# Patient Record
Sex: Male | Born: 1970
Health system: Southern US, Community
[De-identification: ages and names within clinical notes are randomized; demographics above are authoritative.]

## PROBLEM LIST (undated history)

## (undated) DIAGNOSIS — M545 Low back pain, unspecified: Secondary | ICD-10-CM

## (undated) DIAGNOSIS — D649 Anemia, unspecified: Secondary | ICD-10-CM

## (undated) DIAGNOSIS — H9319 Tinnitus, unspecified ear: Secondary | ICD-10-CM

## (undated) DIAGNOSIS — F32A Depression, unspecified: Secondary | ICD-10-CM

## (undated) DIAGNOSIS — M25569 Pain in unspecified knee: Secondary | ICD-10-CM

## (undated) DIAGNOSIS — K55069 Acute infarction of intestine, part and extent unspecified: Secondary | ICD-10-CM

## (undated) DIAGNOSIS — H9191 Unspecified hearing loss, right ear: Secondary | ICD-10-CM

## (undated) DIAGNOSIS — T7840XA Allergy, unspecified, initial encounter: Secondary | ICD-10-CM

## (undated) DIAGNOSIS — K6389 Other specified diseases of intestine: Secondary | ICD-10-CM

## (undated) DIAGNOSIS — D689 Coagulation defect, unspecified: Secondary | ICD-10-CM

## (undated) DIAGNOSIS — Z860100 Personal history of colon polyps, unspecified: Secondary | ICD-10-CM

## (undated) DIAGNOSIS — Z87442 Personal history of urinary calculi: Secondary | ICD-10-CM

## (undated) DIAGNOSIS — G4730 Sleep apnea, unspecified: Secondary | ICD-10-CM

## (undated) DIAGNOSIS — F419 Anxiety disorder, unspecified: Secondary | ICD-10-CM

## (undated) DIAGNOSIS — K589 Irritable bowel syndrome without diarrhea: Secondary | ICD-10-CM

## (undated) DIAGNOSIS — C61 Malignant neoplasm of prostate: Secondary | ICD-10-CM

## (undated) DIAGNOSIS — Z5189 Encounter for other specified aftercare: Secondary | ICD-10-CM

## (undated) DIAGNOSIS — I10 Essential (primary) hypertension: Secondary | ICD-10-CM

## (undated) DIAGNOSIS — Z86718 Personal history of other venous thrombosis and embolism: Secondary | ICD-10-CM

## (undated) HISTORY — DX: Personal history of colon polyps, unspecified: Z86.0100

## (undated) HISTORY — DX: Unspecified hearing loss, right ear: H91.91

## (undated) HISTORY — DX: Pain in unspecified knee: M25.569

## (undated) HISTORY — DX: Low back pain, unspecified: M54.50

## (undated) HISTORY — DX: Encounter for other specified aftercare: Z51.89

## (undated) HISTORY — DX: Allergy, unspecified, initial encounter: T78.40XA

## (undated) HISTORY — DX: Personal history of colonic polyps: Z86.010

## (undated) HISTORY — DX: Low back pain: M54.5

## (undated) HISTORY — DX: Other specified diseases of intestine: K63.89

## (undated) HISTORY — PX: COLONOSCOPY: SHX174

## (undated) HISTORY — DX: Irritable bowel syndrome, unspecified: K58.9

## (undated) HISTORY — DX: Acute infarction of intestine, part and extent unspecified: K55.069

## (undated) HISTORY — DX: Malignant neoplasm of prostate: C61

## (undated) HISTORY — DX: Coagulation defect, unspecified: D68.9

## (undated) HISTORY — DX: Personal history of other venous thrombosis and embolism: Z86.718

## (undated) HISTORY — DX: Essential (primary) hypertension: I10

---
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
## 2007-03-18 ENCOUNTER — Encounter: Payer: Self-pay | Admitting: Family Medicine

## 2009-03-06 ENCOUNTER — Ambulatory Visit: Payer: Self-pay | Admitting: Family Medicine

## 2009-03-06 DIAGNOSIS — J309 Allergic rhinitis, unspecified: Secondary | ICD-10-CM | POA: Insufficient documentation

## 2009-03-06 DIAGNOSIS — M545 Low back pain, unspecified: Secondary | ICD-10-CM | POA: Insufficient documentation

## 2009-03-06 DIAGNOSIS — M25569 Pain in unspecified knee: Secondary | ICD-10-CM | POA: Insufficient documentation

## 2009-03-06 DIAGNOSIS — H9190 Unspecified hearing loss, unspecified ear: Secondary | ICD-10-CM | POA: Insufficient documentation

## 2009-03-06 DIAGNOSIS — M2140 Flat foot [pes planus] (acquired), unspecified foot: Secondary | ICD-10-CM | POA: Insufficient documentation

## 2009-04-18 ENCOUNTER — Ambulatory Visit: Payer: Self-pay | Admitting: Internal Medicine

## 2009-04-18 DIAGNOSIS — R4589 Other symptoms and signs involving emotional state: Secondary | ICD-10-CM | POA: Insufficient documentation

## 2009-04-18 DIAGNOSIS — G4700 Insomnia, unspecified: Secondary | ICD-10-CM | POA: Insufficient documentation

## 2009-04-19 ENCOUNTER — Encounter: Payer: Self-pay | Admitting: Internal Medicine

## 2009-04-19 ENCOUNTER — Telehealth: Payer: Self-pay | Admitting: Internal Medicine

## 2009-04-23 ENCOUNTER — Ambulatory Visit: Payer: Self-pay | Admitting: Internal Medicine

## 2010-06-13 ENCOUNTER — Ambulatory Visit: Payer: Self-pay | Admitting: Family Medicine

## 2010-06-13 DIAGNOSIS — G4730 Sleep apnea, unspecified: Secondary | ICD-10-CM | POA: Insufficient documentation

## 2010-06-13 DIAGNOSIS — N529 Male erectile dysfunction, unspecified: Secondary | ICD-10-CM | POA: Insufficient documentation

## 2010-06-16 LAB — CONVERTED CEMR LAB
Albumin: 3.9 g/dL (ref 3.5–5.2)
Alkaline Phosphatase: 42 units/L (ref 39–117)
BUN: 8 mg/dL (ref 6–23)
Basophils Absolute: 0 10*3/uL (ref 0.0–0.1)
CO2: 30 meq/L (ref 19–32)
Calcium: 8.8 mg/dL (ref 8.4–10.5)
Eosinophils Absolute: 0.4 10*3/uL (ref 0.0–0.7)
Glucose, Bld: 79 mg/dL (ref 70–99)
Hemoglobin: 15.3 g/dL (ref 13.0–17.0)
Lymphocytes Relative: 22.6 % (ref 12.0–46.0)
Lymphs Abs: 1.1 10*3/uL (ref 0.7–4.0)
MCHC: 32.8 g/dL (ref 30.0–36.0)
Neutro Abs: 3.1 10*3/uL (ref 1.4–7.7)
Platelets: 202 10*3/uL (ref 150.0–400.0)
RDW: 14.8 % — ABNORMAL HIGH (ref 11.5–14.6)
Sodium: 141 meq/L (ref 135–145)
TSH: 0.78 microintl units/mL (ref 0.35–5.50)
Total Bilirubin: 0.9 mg/dL (ref 0.3–1.2)

## 2010-07-21 ENCOUNTER — Ambulatory Visit
Admission: RE | Admit: 2010-07-21 | Discharge: 2010-07-21 | Payer: Self-pay | Source: Home / Self Care | Attending: Pulmonary Disease | Admitting: Pulmonary Disease

## 2010-07-21 DIAGNOSIS — R0609 Other forms of dyspnea: Secondary | ICD-10-CM | POA: Insufficient documentation

## 2010-07-21 DIAGNOSIS — R0989 Other specified symptoms and signs involving the circulatory and respiratory systems: Secondary | ICD-10-CM

## 2010-07-30 ENCOUNTER — Ambulatory Visit (INDEPENDENT_AMBULATORY_CARE_PROVIDER_SITE_OTHER): Payer: 59 | Admitting: Pulmonary Disease

## 2010-07-30 ENCOUNTER — Encounter: Payer: Self-pay | Admitting: Pulmonary Disease

## 2010-07-30 DIAGNOSIS — G4733 Obstructive sleep apnea (adult) (pediatric): Secondary | ICD-10-CM

## 2010-07-31 NOTE — Assessment & Plan Note (Signed)
Summary: sleep apnea//ccm   Vital Signs:  Patient profile:   40 year old male Height:      67 inches Weight:      218 pounds BMI:     34.27 O2 Sat:      98 % on Room air Temp:     98.3 degrees F oral Pulse rate:   72 / minute BP sitting:   120 / 80  (left arm) Cuff size:   large  Vitals Entered By: Romualdo Bolk, CMA (AAMA) (June 13, 2010 10:01 AM)  O2 Flow:  Room air CC: Discuss Sleep Apnea   History of Present Illness: Here for several reasons. First, he has snored all his life, and his wife says it is getting worse. She now sleeps in a separate room from him due to the snoring. he sleeps well, and she has never mentioned him stopping breathing, but they are both concerned about possible  sleep apnea. He denies any daytime sleepiness. Also, he has develped erection difficulties in the past year. He denies any particular stressors, and his marriage is good.   Preventive Screening-Counseling & Management  Caffeine-Diet-Exercise     Does Patient Exercise: yes  Current Medications (verified): 1)  Naprosyn 500 Mg Tabs (Naproxen) .... Two Times A Day As Needed 2)  Cyclobenzaprine Hcl 10 Mg  Tabs (Cyclobenzaprine Hcl) .Marland Kitchen.. 1 By Mouth 2 Times Daily As Needed For Back Pain 3)  Gabapentin 300 Mg Caps (Gabapentin) .Marland Kitchen.. 1 By Mouth At Bedtime  Allergies (verified): No Known Drug Allergies  Past History:  Past Medical History: Allergic rhinitis pes planus Low back pain, sees Dr. Worthy Rancher and the [facility] clinic, has degenerative discs  knee pain right ear hearing loss  Past Surgical History: Reviewed history from 03/06/2009 and no changes required. Denies surgical history  Review of Systems  The patient denies anorexia, fever, weight loss, vision loss, decreased hearing, hoarseness, chest pain, syncope, dyspnea on exertion, peripheral edema, prolonged cough, headaches, hemoptysis, abdominal pain, melena, hematochezia, severe indigestion/heartburn,  hematuria, incontinence, genital sores, muscle weakness, suspicious skin lesions, transient blindness, difficulty walking, depression, unusual weight change, abnormal bleeding, enlarged lymph nodes, angioedema, breast masses, and testicular masses.    Physical Exam  General:  overweight-appearing.   Head:  Normocephalic and atraumatic without obvious abnormalities. No apparent alopecia or balding. Eyes:  No corneal or conjunctival inflammation noted. EOMI. Perrla. Funduscopic exam benign, without hemorrhages, exudates or papilledema. Vision grossly normal. Ears:  External ear exam shows no significant lesions or deformities.  Otoscopic examination reveals clear canals, tympanic membranes are intact bilaterally without bulging, retraction, inflammation or discharge. Hearing is grossly normal bilaterally. Nose:  External nasal examination shows no deformity or inflammation. Nasal mucosa are pink and moist without lesions or exudates. Nasal passages are quite narrow  Mouth:  Oral mucosa and oropharynx without lesions or exudates.  Teeth in good repair. The posterior OP is quite narrow.  Neck:  No deformities, masses, or tenderness noted. Lungs:  Normal respiratory effort, chest expands symmetrically. Lungs are clear to auscultation, no crackles or wheezes. Heart:  Normal rate and regular rhythm. S1 and S2 normal without gallop, murmur, click, rub or other extra sounds.   Impression & Recommendations:  Problem # 1:  SLEEP APNEA (ICD-780.57)  Orders: Pulmonary Referral (Pulmonary)  Problem # 2:  ERECTILE DYSFUNCTION, ORGANIC (ICD-607.84)  Orders: Venipuncture (19147) TLB-BMP (Basic Metabolic Panel-BMET) (80048-METABOL) TLB-CBC Platelet - w/Differential (85025-CBCD) TLB-Hepatic/Liver Function Pnl (80076-HEPATIC) TLB-TSH (Thyroid Stimulating Hormone) (84443-TSH) TLB-Testosterone, Total (  84403-TESTO) Specimen Handling (16109)  His updated medication list for this problem includes:    Viagra  50 Mg Tabs (Sildenafil citrate) .Marland Kitchen... As needed  Complete Medication List: 1)  Naprosyn 500 Mg Tabs (Naproxen) .... Two times a day as needed 2)  Cyclobenzaprine Hcl 10 Mg Tabs (Cyclobenzaprine hcl) .Marland Kitchen.. 1 by mouth 2 times daily as needed for back pain 3)  Gabapentin 300 Mg Caps (Gabapentin) .Marland Kitchen.. 1 by mouth at bedtime 4)  Viagra 50 Mg Tabs (Sildenafil citrate) .... As needed  Patient Instructions: 1)  refer for a sleep study. Get labs. try Viagra.  2)  It is important that you exercise reguarly at least 20 minutes 5 times a week. If you develop chest pain, have severe difficulty breathing, or feel very tired, stop exercising immediately and seek medical attention.  3)  You need to lose weight. Consider a lower calorie diet and regular exercise.    Orders Added: 1)  Est. Patient Level IV [60454] 2)  Venipuncture [36415] 3)  TLB-BMP (Basic Metabolic Panel-BMET) [80048-METABOL] 4)  TLB-CBC Platelet - w/Differential [85025-CBCD] 5)  TLB-Hepatic/Liver Function Pnl [80076-HEPATIC] 6)  TLB-TSH (Thyroid Stimulating Hormone) [84443-TSH] 7)  TLB-Testosterone, Total [84403-TESTO] 8)  Specimen Handling [99000] 9)  Pulmonary Referral [Pulmonary]

## 2010-07-31 NOTE — Assessment & Plan Note (Signed)
Summary: consult for possible osa   Copy to:  stephen fry Primary Provider/Referring Provider:  Nelwyn Salisbury MD  CC:  sleep consult.  History of Present Illness: the pt is a 40y/o male who I have been asked to see for possible osa.  He has been noted to have loud snoring, but no one has ever commented on an abnormal breathing pattern during sleep.  He denies having any choking arousals.  He goes to bed at 11-69mn, and arises at 6am to start his day.  He feels that he is rested upon arising.  He denies EDS while at work, but admits that he stays fairly busy.  He does not think his alertness is an issue overall during the day.  He does admit though to some sleepiness in the evening while watching tv, and occasionally has sleep pressure with driving longer distances.  The pt states that his weight is neutral over the last 2 years, and his epworth score today is normal at 4.    Current Medications (verified): 1)  Naprosyn 500 Mg Tabs (Naproxen) .... Two Times A Day As Needed 2)  Cyclobenzaprine Hcl 10 Mg  Tabs (Cyclobenzaprine Hcl) .Marland Kitchen.. 1 By Mouth 2 Times Daily As Needed For Back Pain 3)  Gabapentin 300 Mg Caps (Gabapentin) .Marland Kitchen.. 1 By Mouth At Bedtime 4)  Viagra 50 Mg Tabs (Sildenafil Citrate) .... As Needed 5)  Prednisone 5 Mg Tabs (Prednisone) .... Take 1 Tablet By Mouth Once A Day 6)  Tramadol Hcl 50 Mg Tabs (Tramadol Hcl) .... Take 1 Tab By Mouth Every 4 To 6 Hours As Needed  Allergies (verified): No Known Drug Allergies  Past History:  Past Medical History: Reviewed history from 06/13/2010 and no changes required. Allergic rhinitis pes planus Low back pain, sees Dr. Worthy Rancher and the [facility], has degenerative discs  knee pain right ear hearing loss  Past Surgical History: jaw surgery d/t fx 1990  Family History: Family History Diabetes 1st degree relative Family History Hypertension Family History of Prostate CA 1st degree relative <50 heart disease:  mgm  Social History: Former smoker. started at age 55.  1 1/2 ppd.  quit August 2008. Married has children. Alcohol use-yes Drug use-no Regular exercise-yes retired from [employer], now a Doctor, general practice  Review of Systems  The patient denies shortness of breath with activity, shortness of breath at rest, productive cough, non-productive cough, coughing up blood, chest pain, irregular heartbeats, acid heartburn, indigestion, loss of appetite, weight change, abdominal pain, difficulty swallowing, sore throat, tooth/dental problems, headaches, nasal congestion/difficulty breathing through nose, sneezing, itching, ear ache, anxiety, depression, hand/feet swelling, joint stiffness or pain, rash, change in color of mucus, and fever.    Vital Signs:  Patient profile:   40 year old male Height:      67 inches Weight:      218 pounds BMI:     34.27 O2 Sat:      95 % on Room air Temp:     98.2 degrees F oral Pulse rate:   70 / minute BP sitting:   100 / 80  (left arm) Cuff size:   large  Vitals Entered By: Arman Filter LPN (July 21, 2010 10:46 AM)  O2 Flow:  Room air CC: sleep consult Comments Medications reviewed with patient Arman Filter LPN  July 21, 2010 10:46 AM    Physical Exam  General:  ow male in nad Eyes:  PERRLA and EOMI.   Nose:  deviated septum to left with partial obstruction, mild mucosal edema right fairly clear Mouth:  moderate elongation of uvula and palate. no lesions or exudates seen. Neck:  no jvd, tmg, LN Lungs:  totally clear to auscultation Heart:  rrr, no mrg Abdomen:  soft and nontender, bs+ Extremities:  no edema or cyanosis  pulses intact distally Neurologic:  appears mildly sleepy, but awake, moves all 4.   Impression & Recommendations:  Problem # 1:  SNORING (ICD-786.09) the pt has loud snoring by history, but unclear if he has frank sleep apnea.  He feels that he sleep well, but he does have some degree of  daytime sleepiness at various times.  It does not appear to be significantly impacting his QOL however.  I have had a long discussion with the pt about sleep apnea, including its impact on QOL and CV health.  I think he needs to have a sleep study to put this issue to rest, and given his overall health would be a good candidate for a home study.  If this does not show osa, he will need to decide how aggressive to be with his symptomatic snoring.  I will discuss with the pt once the results are available.  Medications Added to Medication List This Visit: 1)  Prednisone 5 Mg Tabs (Prednisone) .... Take 1 tablet by mouth once a day 2)  Tramadol Hcl 50 Mg Tabs (Tramadol hcl) .... Take 1 tab by mouth every 4 to 6 hours as needed  Other Orders: Consultation Level IV (40981) Misc. Referral (Misc. Ref)  Patient Instructions: 1)  will set up for home sleep study.  I will call you with the results. 2)  work on weight loss

## 2010-08-04 DIAGNOSIS — G4733 Obstructive sleep apnea (adult) (pediatric): Secondary | ICD-10-CM | POA: Insufficient documentation

## 2010-08-07 ENCOUNTER — Ambulatory Visit (INDEPENDENT_AMBULATORY_CARE_PROVIDER_SITE_OTHER): Payer: 59 | Admitting: Pulmonary Disease

## 2010-08-07 ENCOUNTER — Encounter: Payer: Self-pay | Admitting: Pulmonary Disease

## 2010-08-07 DIAGNOSIS — G4733 Obstructive sleep apnea (adult) (pediatric): Secondary | ICD-10-CM

## 2010-08-08 ENCOUNTER — Ambulatory Visit: Payer: 59 | Admitting: Pulmonary Disease

## 2010-08-14 NOTE — Assessment & Plan Note (Signed)
Summary: home sleep testing shows AHI 22/hr.   Primary Provider/Referring Provider:  Nelwyn Salisbury MD   History of Present Illness: the pt underwent home sleep testing with a type 3 portable device.  Airflow, effort, heart rate, and oximetry were all monitored.  Flow and saturation evaluation period averaged approximately 6hrs.  Tracings have been reviewed with the following results:  1) the pt had 19 apneas with 9 of them being central in nature, and also 105 obstructive hypopneas.       This gave him an AHI of 22/hr. 2) low oxygen saturation was 81%, and only spent less than or equal to 88% sat.  Allergies: No Known Drug Allergies   Impression & Recommendations:  Problem # 1:  OBSTRUCTIVE SLEEP APNEA (ICD-327.23)  the pt has moderate osa, with AHI 22/hr and desat to 81%.  Will arrange for ov to discuss treatment options.  Other Orders: Sleep Std Airflow/Heartrate and O2 SAT unattended (04540)   Orders Added: 1)  Sleep Std Airflow/Heartrate and O2 SAT unattended [95806]  Appended Document: home sleep testing shows AHI 22/hr. Aundra Millet, pt will need ov to review sleep study results.  let him know was positive for osa, and will discuss further with him at visit.  Appended Document: home sleep testing shows AHI 22/hr. [id number]  Appended Document: home sleep testing shows AHI 22/hr. ATC pt at home #.  Line busy.  WCB  Appended Document: home sleep testing shows AHI 22/hr. pt called back.  pt scheduled to see [facility] 08-07-2010 at 9:30am

## 2010-08-26 NOTE — Assessment & Plan Note (Signed)
Summary: rov to discuss results of home sleep testing.   Primary Provider/Referring Provider:  Nelwyn Salisbury MD  CC:  Ov to discuss home sleep testing results .  History of Present Illness: the pt comes in today to discuss results of his recent home sleep testing.  He was found to have moderate osa with AHI 22/hr and desat to 81%.  I have reviewed the study in detail with him, and answered all of his questions.   Current Medications (verified): 1)  Viagra 50 Mg Tabs (Sildenafil Citrate) .... As Needed 2)  Tramadol Hcl 50 Mg Tabs (Tramadol Hcl) .... Take 1 Tab By Mouth Every 4 To 6 Hours As Needed  Allergies (verified): No Known Drug Allergies  Review of Systems       The patient complains of acid heartburn and indigestion.  The patient denies shortness of breath with activity, shortness of breath at rest, productive cough, non-productive cough, coughing up blood, chest pain, irregular heartbeats, loss of appetite, weight change, abdominal pain, difficulty swallowing, sore throat, tooth/dental problems, headaches, nasal congestion/difficulty breathing through nose, sneezing, itching, ear ache, anxiety, depression, hand/feet swelling, joint stiffness or pain, rash, change in color of mucus, and fever.    Vital Signs:  Patient profile:   40 year old male Height:      67 inches Weight:      214.38 pounds BMI:     33.70 O2 Sat:      95 % on Room air Temp:     98.1 degrees F oral Pulse rate:   73 / minute BP sitting:   102 / 70  (left arm) Cuff size:   large  Vitals Entered By: Arman Filter LPN (August 07, 2010 9:12 AM)  O2 Flow:  Room air CC: Ov to discuss home sleep testing results  Comments Medications reviewed with patient Arman Filter LPN  August 07, 2010 9:12 AM    Physical Exam  General:  obese male in nad Extremities:  no signficant edema, no cyanosis  Neurologic:  alert and oriented, does not appear sleepy moves all 4.    Impression &  Recommendations:  Problem # 1:  OBSTRUCTIVE SLEEP APNEA (ICD-327.23)  the pt has mild to moderate osa by his recent sleep study, and does have impact on his QOL.  I have reviewed the various treatment options with him, including a trial of weight loss alone, upper airway surgery, dental appliance, and cpap.  At this point, he would like to review his surgical options given his abnormal anatomy, and will also work on weight loss.  Other Orders: Est. Patient Level III (16109) ENT Referral (ENT)  Patient Instructions: 1)  will refer to ENT for upper airway evaluation for possible surgery. 2)  work on weight loss 3)  please call me once you see the ENT with your decision.

## 2011-01-14 ENCOUNTER — Other Ambulatory Visit: Payer: 59

## 2011-01-20 ENCOUNTER — Encounter: Payer: Self-pay | Admitting: Family Medicine

## 2011-01-21 ENCOUNTER — Telehealth: Payer: Self-pay | Admitting: Family Medicine

## 2011-01-21 ENCOUNTER — Encounter: Payer: Self-pay | Admitting: Family Medicine

## 2011-01-21 ENCOUNTER — Ambulatory Visit (INDEPENDENT_AMBULATORY_CARE_PROVIDER_SITE_OTHER): Payer: 59 | Admitting: Family Medicine

## 2011-01-21 VITALS — BP 118/86 | HR 76 | Temp 98.6°F | Ht 67.0 in | Wt 200.0 lb

## 2011-01-21 DIAGNOSIS — Z0000 Encounter for general adult medical examination without abnormal findings: Secondary | ICD-10-CM

## 2011-01-21 LAB — LIPID PANEL
Cholesterol: 172 mg/dL (ref 0–200)
Triglycerides: 149 mg/dL (ref 0.0–149.0)

## 2011-01-21 MED ORDER — TADALAFIL 20 MG PO TABS: 20.0000 mg | ORAL_TABLET | Freq: Every day | ORAL | Status: DC | PRN

## 2011-01-21 MED ORDER — FLUTICASONE PROPIONATE 50 MCG/ACT NA SUSP: 2.0000 | Freq: Every day | NASAL | Status: DC

## 2011-01-21 NOTE — Progress Notes (Signed)
  Subjective:    Patient ID: Marcus Pruitt, male    DOB: [birth year], 40 y.o.   MRN: [id number]  HPI 40 yr old male for a cpx. He feels fine and has no concerns.    Review of Systems  Constitutional: Negative.   HENT: Negative.   Eyes: Negative.   Respiratory: Negative.   Cardiovascular: Negative.   Gastrointestinal: Negative.   Genitourinary: Negative.   Musculoskeletal: Negative.   Skin: Negative.   Neurological: Negative.   Hematological: Negative.   Psychiatric/Behavioral: Negative.        Objective:   Physical Exam  Constitutional: He is oriented to person, place, and time. He appears well-developed and well-nourished. No distress.  HENT:  Head: Normocephalic and atraumatic.  Right Ear: External ear normal.  Left Ear: External ear normal.  Nose: Nose normal.  Mouth/Throat: Oropharynx is clear and moist. No oropharyngeal exudate.  Eyes: Conjunctivae and EOM are normal. Pupils are equal, round, and reactive to light. Right eye exhibits no discharge. Left eye exhibits no discharge. No scleral icterus.  Neck: Neck supple. No JVD present. No tracheal deviation present. No thyromegaly present.  Cardiovascular: Normal rate, regular rhythm, normal heart sounds and intact distal pulses.  Exam reveals no gallop and no friction rub.   No murmur heard. Pulmonary/Chest: Effort normal and breath sounds normal. No respiratory distress. He has no wheezes. He has no rales. He exhibits no tenderness.  Abdominal: Soft. Bowel sounds are normal. He exhibits no distension and no mass. There is no tenderness. There is no rebound and no guarding.  Genitourinary: Rectum normal, prostate normal and penis normal. Guaiac negative stool. No penile tenderness.  Musculoskeletal: Normal range of motion. He exhibits no edema and no tenderness.  Lymphadenopathy:    He has no cervical adenopathy.  Neurological: He is alert and oriented to person, place, and time. He has normal reflexes. No cranial nerve  deficit. He exhibits normal muscle tone. Coordination normal.  Skin: Skin is warm and dry. No rash noted. He is not diaphoretic. No erythema. No pallor.  Psychiatric: He has a normal mood and affect. His behavior is normal. Judgment and thought content normal.          Assessment & Plan:  Well exam.

## 2011-01-21 NOTE — Telephone Encounter (Signed)
Pt called and said that his insurance will not cover tadalafil (CIALIS) 20 MG tablet. Pt req an alternative med that pts insurance will cover.

## 2011-01-22 MED ORDER — TADALAFIL 5 MG PO TABS: 5.0000 mg | ORAL_TABLET | Freq: Every day | ORAL | Status: AC | PRN

## 2011-01-22 NOTE — Telephone Encounter (Signed)
Pt called and is req that he would like the Cialis script to be re-written for 5 mg #30. Pt has a voucher that will allow him to get med without his insurance. Pls call in to [facility] on [address].

## 2011-01-22 NOTE — Telephone Encounter (Signed)
rx sent in electronically 

## 2011-12-09 ENCOUNTER — Encounter: Payer: Self-pay | Admitting: Family Medicine

## 2011-12-09 ENCOUNTER — Ambulatory Visit (INDEPENDENT_AMBULATORY_CARE_PROVIDER_SITE_OTHER): Payer: 59 | Admitting: Family Medicine

## 2011-12-09 VITALS — BP 134/80 | HR 92 | Temp 98.5°F | Wt 210.0 lb

## 2011-12-09 DIAGNOSIS — M79609 Pain in unspecified limb: Secondary | ICD-10-CM

## 2011-12-09 DIAGNOSIS — M79676 Pain in unspecified toe(s): Secondary | ICD-10-CM

## 2011-12-09 NOTE — Progress Notes (Signed)
  Subjective:    Patient ID: Marcus Pruitt, male    DOB: 11-08-1970, 41 y.o.   MRN: [id number]  HPI Here complaining about severe pain in the 2nd toe on the left foot. On 11-16-11 he had surgery on this toe per Dr. Tereasa Coop, a podiatrist at the [facility] clinic. His phone number is [phone] (ext [phone]). This was to remove a benign lesion on the bottom of the toe that was quite deep, and the surgery went all the way down to the flexor tendon per the patient. He did well at first, and he was cleard to return to work on 12-07-11. His job requires him to walk or stand constantly on a hard floor while wearing steel toed boots. The toe became very painful the first day, and has gotten worse. Today Dupree had to leave work early due to severe pain. There has been no redness in the toe, no drainage. Using Motrin for pain during the day and hydrocodone pain meds at night.    Review of Systems  Constitutional: Negative.   Musculoskeletal: Positive for arthralgias.       Objective:   Physical Exam  Constitutional:       In pain, limping  Musculoskeletal:       The undersurface of the left 2nd toe has a surgical wound that is partially closed with granulation tissue. It looks clean, no redness or warmth or swelling or drainage. It is extremely tender          Assessment & Plan:  Toe pain after a surgery. Unfortunately Dr. Andrey Campanile is the only podiatrist at the [facility] clinic, and he will be out of the office until 12-18-11. There is no one to cover his patients in his absence apparently, so Ariyan asks [place] for help. He says he cannot work at all as along as the toe hurts this bad. We wrote a note for him to be out of work today until 12-18-11. On that day he will follow up with Dr. Andrey Campanile. Stay off the foot, use Motrin prn

## 2011-12-10 ENCOUNTER — Telehealth: Payer: Self-pay | Admitting: Family Medicine

## 2011-12-10 NOTE — Telephone Encounter (Signed)
Pt needs work note extended until 12-21-2011. Dr Denyse Amass at [facility] will not be back in office until 12-21-2011

## 2011-12-10 NOTE — Telephone Encounter (Signed)
Pt was seen on 12-09-2011. Pt needs disability paperwork completed for one week Dr Clent Ridges put him out of work. Pt will fax form over. This problem (toe) is results of relapse.

## 2011-12-11 NOTE — Telephone Encounter (Signed)
I filled out work note, okay per Dr. Clent Ridges. I spoke with pt and he will pick up.

## 2011-12-11 NOTE — Telephone Encounter (Signed)
Okay, I will fill out the forms when I get them

## 2011-12-17 DIAGNOSIS — Z0279 Encounter for issue of other medical certificate: Secondary | ICD-10-CM

## 2011-12-17 NOTE — Telephone Encounter (Signed)
Patient called stating he would like a copy of the form when complete.

## 2011-12-18 NOTE — Telephone Encounter (Signed)
Dr. Clent Ridges did fill out the paper work this week for disability and he sent it up front.  Can you check to see if we still have the copy and let pt know?

## 2012-04-19 ENCOUNTER — Encounter: Payer: Self-pay | Admitting: Family Medicine

## 2012-04-19 ENCOUNTER — Ambulatory Visit (INDEPENDENT_AMBULATORY_CARE_PROVIDER_SITE_OTHER): Payer: 59 | Admitting: Family Medicine

## 2012-04-19 VITALS — BP 132/84 | HR 80 | Temp 98.6°F | Wt 203.0 lb

## 2012-04-19 DIAGNOSIS — Z202 Contact with and (suspected) exposure to infections with a predominantly sexual mode of transmission: Secondary | ICD-10-CM

## 2012-04-19 DIAGNOSIS — Z2089 Contact with and (suspected) exposure to other communicable diseases: Secondary | ICD-10-CM

## 2012-04-19 MED ORDER — METRONIDAZOLE 500 MG PO TABS: 500.0000 mg | ORAL_TABLET | Freq: Once | ORAL | Status: DC

## 2012-04-19 NOTE — Progress Notes (Signed)
  Subjective:    Patient ID: Marcus Pruitt, male    DOB: September 17, 1970, 41 y.o.   MRN: [id number]  HPI Here asking to be checked for STDs. He has had sex with someone about 3 times recently, and this person was recently told that they have Trichomonas. They were treated for this. Herchel denies any symptoms. He recently had screening labs at the [place], and these included an HIV.    Review of Systems  Constitutional: Negative.   Genitourinary: Negative.        Objective:   Physical Exam  Constitutional: He appears well-developed and well-nourished.          Assessment & Plan:  Exposure to STDs. Treat with Metronidazole to cover Trichomonas. Get other labs.

## 2012-04-20 LAB — URINALYSIS, MICROSCOPIC ONLY
Bacteria, UA: NONE SEEN
Casts: NONE SEEN

## 2012-04-20 LAB — RPR

## 2012-04-20 NOTE — Progress Notes (Signed)
Quick Note:  I left voice message, saying results were negative. ______

## 2012-04-28 ENCOUNTER — Telehealth: Payer: Self-pay | Admitting: Family Medicine

## 2012-04-28 NOTE — Telephone Encounter (Signed)
Patient called stating that upon picking up his rx from the pharmacy, the instructions were not clear concerning the flagyl. Please assist.

## 2012-04-28 NOTE — Telephone Encounter (Signed)
I left voice message with below information. 

## 2012-04-28 NOTE — Telephone Encounter (Signed)
He is to take all 4 tablets at the same time

## 2012-05-04 ENCOUNTER — Encounter: Payer: 59 | Admitting: Family Medicine

## 2012-08-13 ENCOUNTER — Other Ambulatory Visit: Payer: Self-pay

## 2013-05-04 ENCOUNTER — Other Ambulatory Visit: Payer: Self-pay

## 2013-12-26 ENCOUNTER — Other Ambulatory Visit: Payer: 59

## 2014-01-01 ENCOUNTER — Encounter: Payer: 59 | Admitting: Family Medicine

## 2014-01-29 ENCOUNTER — Ambulatory Visit: Payer: 59 | Admitting: Family Medicine

## 2014-01-31 ENCOUNTER — Ambulatory Visit: Payer: 59 | Admitting: Family Medicine

## 2014-02-01 ENCOUNTER — Ambulatory Visit (INDEPENDENT_AMBULATORY_CARE_PROVIDER_SITE_OTHER): Payer: 59 | Admitting: Family Medicine

## 2014-02-01 ENCOUNTER — Encounter: Payer: Self-pay | Admitting: Family Medicine

## 2014-02-01 VITALS — BP 124/69 | HR 85 | Temp 98.9°F | Ht 67.0 in | Wt 214.0 lb

## 2014-02-01 DIAGNOSIS — M545 Low back pain, unspecified: Secondary | ICD-10-CM

## 2014-02-01 DIAGNOSIS — N529 Male erectile dysfunction, unspecified: Secondary | ICD-10-CM

## 2014-02-01 DIAGNOSIS — M25569 Pain in unspecified knee: Secondary | ICD-10-CM

## 2014-02-01 MED ORDER — SILDENAFIL CITRATE 100 MG PO TABS: 50.0000 mg | ORAL_TABLET | Freq: Every day | ORAL | Status: DC | PRN

## 2014-02-01 NOTE — Progress Notes (Signed)
Pre visit review using our clinic review tool, if applicable. No additional management support is needed unless otherwise documented below in the visit note. 

## 2014-02-02 ENCOUNTER — Encounter: Payer: Self-pay | Admitting: Family Medicine

## 2014-02-02 NOTE — Progress Notes (Signed)
   Subjective:    Patient ID: Marcus Pruitt, male    DOB: April 03, 1971, 43 y.o.   MRN: [id number]  HPI Here to discuss ED meds. He had tried daily Cialis 5 mg and was not happy with the results. He has tried Viagra in the past and it worked well but gave him some HAs.    Review of Systems  Constitutional: Negative.        Objective:   Physical Exam  Constitutional: He appears well-developed and well-nourished.  Cardiovascular: Normal rate, regular rhythm, normal heart sounds and intact distal pulses.   Pulmonary/Chest: Effort normal and breath sounds normal.          Assessment & Plan:  We refilled Viagra 100 mg and I told him e has the option of cutting these in half.

## 2014-03-29 ENCOUNTER — Telehealth: Payer: Self-pay

## 2014-03-29 NOTE — Telephone Encounter (Signed)
Pt was seen at urgent care for elevated BP and was given Lisinopril 5 mg.  Pt would like to know if he should start that medication.  His the bottom number of his BP has been above 80. Dr. Sarajane Jews only has same day appointments.  Pls advise if pt can be worked in on 03/30/14.

## 2014-03-30 ENCOUNTER — Encounter: Payer: Self-pay | Admitting: Family Medicine

## 2014-03-30 ENCOUNTER — Ambulatory Visit (INDEPENDENT_AMBULATORY_CARE_PROVIDER_SITE_OTHER): Payer: 59 | Admitting: Family Medicine

## 2014-03-30 VITALS — BP 130/102 | Temp 98.9°F | Wt 210.0 lb

## 2014-03-30 DIAGNOSIS — I10 Essential (primary) hypertension: Secondary | ICD-10-CM

## 2014-03-30 MED ORDER — LISINOPRIL-HYDROCHLOROTHIAZIDE 10-12.5 MG PO TABS: 1.0000 | ORAL_TABLET | Freq: Every day | ORAL | Status: DC

## 2014-03-30 NOTE — Telephone Encounter (Signed)
Can you call pt to schedule for today?

## 2014-03-30 NOTE — Telephone Encounter (Signed)
appt sch today at 1pm

## 2014-03-30 NOTE — Progress Notes (Signed)
   Subjective:    Patient ID: Marcus Pruitt, male    DOB: Feb 07, 1971, 43 y.o.   MRN: [id number]  HPI Here for elevated BP. He has a strong family hx of HTN including his mother who has had a stroke. Over the past 6 months his BP has been slowly rising and he found it to be 735 systolic at work 2 weeks ago. Then he went to Urgent Care last week for lightheadedness and it was 141/102. They told him to see his PCP. Today he feels fine, no HA or chest pain or SOB. 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    Review of Systems  Constitutional: Negative.   Respiratory: Negative.   Cardiovascular: Negative.   Neurological: Negative.        Objective:   Physical Exam  Constitutional: He is oriented to person, place, and time. He appears well-developed and well-nourished.  Cardiovascular: Normal rate, regular rhythm, normal heart sounds and intact distal pulses.   Pulmonary/Chest: Effort normal and breath sounds normal.  Neurological: He is alert and oriented to person, place, and time.          Assessment & Plan:  He has HTN and we will treat it. Start on Lisinopril HCT. He will lose some weight. He had a battery of lab tests at the [place] 2 months ago that were reportedly normal. I asked him to supply me with these results. Recheck in one month

## 2014-03-30 NOTE — Progress Notes (Signed)
Pre visit review using our clinic review tool, if applicable. No additional management support is needed unless otherwise documented below in the visit note. 

## 2014-04-02 ENCOUNTER — Telehealth: Payer: Self-pay | Admitting: Family Medicine

## 2014-04-02 NOTE — Telephone Encounter (Signed)
emmi emailed °

## 2014-05-01 ENCOUNTER — Ambulatory Visit (INDEPENDENT_AMBULATORY_CARE_PROVIDER_SITE_OTHER): Payer: 59 | Admitting: Family Medicine

## 2014-05-01 ENCOUNTER — Encounter: Payer: Self-pay | Admitting: Family Medicine

## 2014-05-01 VITALS — BP 122/80 | HR 71 | Temp 98.7°F | Ht 67.0 in | Wt 207.0 lb

## 2014-05-01 DIAGNOSIS — I10 Essential (primary) hypertension: Secondary | ICD-10-CM

## 2014-05-01 NOTE — Progress Notes (Signed)
Pre visit review using our clinic review tool, if applicable. No additional management support is needed unless otherwise documented below in the visit note. 

## 2014-05-01 NOTE — Progress Notes (Signed)
   Subjective:    Patient ID: Marcus Pruitt, male    DOB: April 15, 1971, 43 y.o.   MRN: [id number]  HPI Here to follow up on newly diagnosed HTN. He tolerates the ne medication well and his BP is stable at home. He feels great.    Review of Systems  Constitutional: Negative.   Respiratory: Negative.   Cardiovascular: Negative.        Objective:   Physical Exam  Constitutional: He appears well-developed and well-nourished.  Cardiovascular: Normal rate, regular rhythm, normal heart sounds and intact distal pulses.   Pulmonary/Chest: Effort normal and breath sounds normal.          Assessment & Plan:  His BP is well controlled. Recheck in 6 months

## 2014-05-08 ENCOUNTER — Ambulatory Visit (INDEPENDENT_AMBULATORY_CARE_PROVIDER_SITE_OTHER): Payer: 59 | Admitting: Internal Medicine

## 2014-05-08 ENCOUNTER — Encounter: Payer: Self-pay | Admitting: Internal Medicine

## 2014-05-08 VITALS — BP 130/90 | HR 81 | Temp 98.4°F | Wt 208.4 lb

## 2014-05-08 DIAGNOSIS — I10 Essential (primary) hypertension: Secondary | ICD-10-CM

## 2014-05-08 DIAGNOSIS — H01003 Unspecified blepharitis right eye, unspecified eyelid: Secondary | ICD-10-CM

## 2014-05-08 MED ORDER — DOXYCYCLINE HYCLATE 100 MG PO TABS: 100.0000 mg | ORAL_TABLET | Freq: Two times a day (BID) | ORAL | Status: DC

## 2014-05-08 NOTE — Progress Notes (Signed)
   Subjective:    Patient ID: Marcus Pruitt, male    DOB: [birth year], 43 y.o.   MRN: [id number]  HPI  Here with acute onset 2-3 days right upper eyelid sharp red/tender/swelling after stye for a few days which did not get better with warm compress..  No trauma, fever, eye pain or vision change.  No HA, sinus symptoms, earache, ST, or cough.   No prior hx of same.  No sick contatct.  Friend has what sounds like a hordoleum..  Nothing else makes better or worse.  Pt denies chest pain, increased sob or doe, wheezing, orthopnea, PND, increased LE swelling, palpitations, dizziness or syncope. Past Medical History  Diagnosis Date  . Allergy   . Low back pain   . Knee pain   . Hearing loss in right ear   . Hypertension    Past Surgical History  Procedure Laterality Date  . Mandible surgery  1990    reports that he quit smoking about 7 years ago. He has never used smokeless tobacco. He reports that he drinks alcohol. He reports that he does not use illicit drugs. family history includes Cancer in an other family member; Diabetes in an other family member; Heart disease in his maternal grandmother; Hypertension in an other family member. No Known Allergies Current Outpatient Prescriptions on File Prior to Visit  Medication Sig Dispense Refill  . HYDROcodone-acetaminophen (NORCO/VICODIN) 5-325 MG per tablet Take 1 tablet by mouth every 6 (six) hours as needed.    Marland Kitchen lisinopril-hydrochlorothiazide (PRINZIDE,ZESTORETIC) 10-12.5 MG per tablet Take 1 tablet by mouth daily. 30 tablet 5  . Naproxen Sodium (ALEVE PO) Take 500 mg by mouth daily as needed.    . sildenafil (VIAGRA) 100 MG tablet Take 0.5-1 tablets (50-100 mg total) by mouth daily as needed for erectile dysfunction. 10 tablet 11   No current facility-administered medications on file prior to visit.   Review of Systems All otherwise neg per pt     Objective:   Physical Exam BP 130/90 mmHg  Pulse 81  Temp(Src) 98.4 F (36.9 C) (Oral)  Wt  208 lb 6 oz (94.518 kg)  SpO2 94% VS noted,  Constitutional: Pt appears well-developed, well-nourished.  HENT: Head: NCAT.  Right Ear: External ear normal.  Left Ear: External ear normal.  Eyes: . Pupils are equal, round, and reactive to light. Conjunctivae and EOM are normal ; eyelids normal except right upper eyelid only with 1-2= red/tender/swelling, nonfluctuance, no drainage or cyst Neck: Normal range of motion. Neck supple.  Cardiovascular: Normal rate and regular rhythm.   Pulmonary/Chest: Effort normal and breath sounds normal.  Neurological: Pt is alert. Not confused , motor grossly intact Skin: Skin is warm. No rash Psychiatric: Pt behavior is normal. No agitation.     Assessment & Plan:

## 2014-05-08 NOTE — Assessment & Plan Note (Signed)
stable overall by history and exam, recent data reviewed with pt, and pt to continue medical treatment as before,  to f/u any worsening symptoms or concerns BP Readings from Last 3 Encounters:  05/08/14 130/90  05/01/14 122/80  03/30/14 130/102

## 2014-05-08 NOTE — Assessment & Plan Note (Signed)
Mild to mod, for antibx course,  to f/u any worsening symptoms or concerns 

## 2014-05-08 NOTE — Patient Instructions (Signed)
Please take all new medication as prescribed  Please continue all other medications as before, and refills have been done if requested.  Please have the pharmacy call with any other refills you may need.  Please keep your appointments with your specialists as you may have planned     

## 2014-05-08 NOTE — Progress Notes (Signed)
Pre visit review using our clinic review tool, if applicable. No additional management support is needed unless otherwise documented below in the visit note. 

## 2015-02-06 ENCOUNTER — Other Ambulatory Visit (HOSPITAL_COMMUNITY)
Admission: RE | Admit: 2015-02-06 | Discharge: 2015-02-06 | Disposition: A | Discharge status: Home / Self Care | Payer: 59 | Source: Ambulatory Visit | Attending: Adult Health | Admitting: Adult Health

## 2015-02-06 ENCOUNTER — Encounter: Payer: Self-pay | Admitting: Adult Health

## 2015-02-06 ENCOUNTER — Ambulatory Visit (INDEPENDENT_AMBULATORY_CARE_PROVIDER_SITE_OTHER): Payer: 59 | Admitting: Adult Health

## 2015-02-06 VITALS — BP 128/84 | Temp 98.5°F | Ht 67.0 in | Wt 216.1 lb

## 2015-02-06 DIAGNOSIS — N528 Other male erectile dysfunction: Secondary | ICD-10-CM | POA: Diagnosis not present

## 2015-02-06 DIAGNOSIS — Z113 Encounter for screening for infections with a predominantly sexual mode of transmission: Secondary | ICD-10-CM | POA: Insufficient documentation

## 2015-02-06 DIAGNOSIS — N529 Male erectile dysfunction, unspecified: Secondary | ICD-10-CM

## 2015-02-06 MED ORDER — SILDENAFIL CITRATE 100 MG PO TABS: 50.0000 mg | ORAL_TABLET | Freq: Every day | ORAL | Status: DC | PRN

## 2015-02-06 NOTE — Patient Instructions (Signed)
I will follow up with you regarding the labs, they should all be back in 2-3 days.   Continue to practice safe sex.   Change diet and start exercising to help with the ED

## 2015-02-06 NOTE — Progress Notes (Signed)
Subjective:    Patient ID: Marcus Pruitt, male    DOB: May 10, 1971, 44 y.o.   MRN: [id number]  HPI  44 year old male who is in a new relationship and would like to be tested for STD. He has no symptoms of STD's. His only other complaint is that of ED. He has suffered from this in the past and has noticed that it is happening more often. He endorses eating terribly, drinking more alcohol and not exercising.    Review of Systems  Constitutional: Negative.   Respiratory: Negative.   Cardiovascular: Negative.   Genitourinary: Negative.   Musculoskeletal: Negative.   Skin: Negative.   Neurological: Negative.   Psychiatric/Behavioral: Negative.   All other systems reviewed and are negative.  Past Medical History  Diagnosis Date  . Allergy   . Low back pain   . Knee pain   . Hearing loss in right ear   . Hypertension     Social History   Social History  . Marital Status: Married    Spouse Name: N/A  . Number of Children: N/A  . Years of Education: N/A   Occupational History  . Not on file.   Social History Main Topics  . Smoking status: Former Smoker    Quit date: 01/28/2007  . Smokeless tobacco: Never Used  . Alcohol Use: 0.0 oz/week    0 Standard drinks or equivalent per week     Comment: occ  . Drug Use: No  . Sexual Activity: Not on file   Other Topics Concern  . Not on file   Social History Narrative    Past Surgical History  Procedure Laterality Date  . Mandible surgery  1990    Family History  Problem Relation Age of Onset  . Heart disease Maternal Grandmother   . Diabetes      fhx  . Hypertension      fhx  . Cancer      fhx/prostate    No Known Allergies  Current Outpatient Prescriptions on File Prior to Visit  Medication Sig Dispense Refill  . Naproxen Sodium (ALEVE PO) Take 500 mg by mouth daily as needed.     No current facility-administered medications on file prior to visit.    BP 128/84 mmHg  Temp(Src) 98.5 F (36.9 C) (Oral)   Ht 5\' 7"  (1.702 m)  Wt 216 lb 1.6 oz (98.022 kg)  BMI 33.84 kg/m2       Objective:   Physical Exam  Constitutional: He is oriented to person, place, and time. He appears well-developed and well-nourished. No distress.  Eyes: Right eye exhibits no discharge. Left eye exhibits no discharge.  Cardiovascular: Normal rate, regular rhythm, normal heart sounds and intact distal pulses.  Exam reveals no friction rub.   No murmur heard. Pulmonary/Chest: Effort normal and breath sounds normal. No respiratory distress. He has no wheezes. He has no rales. He exhibits no tenderness.  Abdominal: Soft. Bowel sounds are normal. He exhibits no distension and no mass. There is no tenderness. There is no rebound and no guarding.  Genitourinary:  Refused by patient.   Musculoskeletal: Normal range of motion.  Lymphadenopathy:    He has no cervical adenopathy.  Neurological: He is alert and oriented to person, place, and time.  Skin: Skin is warm and dry. He is not diaphoretic.  Psychiatric: He has a normal mood and affect. His behavior is normal. Judgment and thought content normal.  Nursing note and vitals reviewed.  Assessment & Plan:  1. Screen for STD (sexually transmitted disease) - Acute Hep Panel & Hep B Surface Ab - HIV antibody - HSV(herpes smplx)abs-1+2(IgG+IgM)-bld - RPR - Urine cytology ancillary only - Will follow up with patient regarding labs.  2. ERECTILE DYSFUNCTION, ORGANIC - Stressed the importance of healthy eating, decreasing alcohol and exercising.  - sildenafil (VIAGRA) 100 MG tablet; Take 0.5-1 tablets (50-100 mg total) by mouth daily as needed for erectile dysfunction.  Dispense: 10 tablet; Refill: 11

## 2015-02-06 NOTE — Progress Notes (Signed)
Pre visit review using our clinic review tool, if applicable. No additional management support is needed unless otherwise documented below in the visit note. 

## 2015-02-07 LAB — ACUTE HEP PANEL AND HEP B SURFACE AB
HCV AB: NEGATIVE
HEP A IGM: NONREACTIVE
HEP B C IGM: NONREACTIVE
Hep B S Ab: POSITIVE — AB
Hepatitis B Surface Ag: NEGATIVE

## 2015-02-07 LAB — HIV ANTIBODY (ROUTINE TESTING W REFLEX): HIV 1&2 Ab, 4th Generation: NONREACTIVE

## 2015-02-07 LAB — RPR

## 2015-02-07 LAB — URINE CYTOLOGY ANCILLARY ONLY
CHLAMYDIA, DNA PROBE: NEGATIVE
NEISSERIA GONORRHEA: NEGATIVE
TRICH (WINDOWPATH): NEGATIVE

## 2015-02-08 LAB — HSV(HERPES SMPLX)ABS-I+II(IGG+IGM)-BLD
HERPES SIMPLEX VRS I-IGM AB (EIA): 0.15 {index}
HSV 1 Glycoprotein G Ab, IgG: <0.1 IV
HSV 2 Glycoprotein G Ab, IgG: 0.14 IV

## 2015-02-19 ENCOUNTER — Telehealth: Payer: Self-pay | Admitting: Family Medicine

## 2015-02-19 NOTE — Telephone Encounter (Signed)
Per [employer] [facility] labs were released to [place] and everything was negative. Called and spoke with pt and pt is aware.

## 2015-02-19 NOTE — Telephone Encounter (Signed)
Pt would like blood work results from 02-06-15. Pt saw cory

## 2015-03-06 ENCOUNTER — Other Ambulatory Visit: Payer: 59

## 2015-03-11 ENCOUNTER — Other Ambulatory Visit (INDEPENDENT_AMBULATORY_CARE_PROVIDER_SITE_OTHER): Payer: 59

## 2015-03-11 DIAGNOSIS — Z0000 Encounter for general adult medical examination without abnormal findings: Secondary | ICD-10-CM | POA: Diagnosis not present

## 2015-03-11 DIAGNOSIS — R7989 Other specified abnormal findings of blood chemistry: Secondary | ICD-10-CM

## 2015-03-11 LAB — BASIC METABOLIC PANEL
BUN: 11 mg/dL (ref 6–23)
CALCIUM: 9.6 mg/dL (ref 8.4–10.5)
CO2: 29 mEq/L (ref 19–32)
Chloride: 103 mEq/L (ref 96–112)
Creatinine, Ser: 1.09 mg/dL (ref 0.40–1.50)
GFR: 94.23 mL/min (ref >60.00)
GLUCOSE: 87 mg/dL (ref 70–99)
Potassium: 4.2 mEq/L (ref 3.5–5.1)
Sodium: 140 mEq/L (ref 135–145)

## 2015-03-11 LAB — CBC WITH DIFFERENTIAL/PLATELET
Basophils Absolute: 0 10*3/uL (ref 0.0–0.1)
Basophils Relative: 0.3 % (ref 0.0–3.0)
EOS PCT: 5.2 % — AB (ref 0.0–5.0)
Eosinophils Absolute: 0.3 10*3/uL (ref 0.0–0.7)
HEMATOCRIT: 47.5 % (ref 39.0–52.0)
HEMOGLOBIN: 15.9 g/dL (ref 13.0–17.0)
LYMPHS PCT: 23.2 % (ref 12.0–46.0)
Lymphs Abs: 1.3 10*3/uL (ref 0.7–4.0)
MCHC: 33.4 g/dL (ref 30.0–36.0)
MCV: 83.7 fl (ref 78.0–100.0)
MONO ABS: 0.4 10*3/uL (ref 0.1–1.0)
Monocytes Relative: 7 % (ref 3.0–12.0)
Neutro Abs: 3.6 10*3/uL (ref 1.4–7.7)
Neutrophils Relative %: 64.3 % (ref 43.0–77.0)
Platelets: 210 10*3/uL (ref 150.0–400.0)
RBC: 5.67 Mil/uL (ref 4.22–5.81)
RDW: 15 % (ref 11.5–15.5)
WBC: 5.6 10*3/uL (ref 4.0–10.5)

## 2015-03-11 LAB — HEPATIC FUNCTION PANEL
ALT: 26 U/L (ref 0–53)
AST: 24 U/L (ref 0–37)
Albumin: 4.3 g/dL (ref 3.5–5.2)
Alkaline Phosphatase: 43 U/L (ref 39–117)
BILIRUBIN DIRECT: 0.1 mg/dL (ref 0.0–0.3)
BILIRUBIN TOTAL: 0.7 mg/dL (ref 0.2–1.2)
TOTAL PROTEIN: 7.5 g/dL (ref 6.0–8.3)

## 2015-03-11 LAB — LIPID PANEL
CHOL/HDL RATIO: 4
Cholesterol: 205 mg/dL — ABNORMAL HIGH (ref 0–200)
HDL: 47 mg/dL (ref >39.00)
NONHDL: 157.97
Triglycerides: 209 mg/dL — ABNORMAL HIGH (ref 0.0–149.0)
VLDL: 41.8 mg/dL — AB (ref 0.0–40.0)

## 2015-03-11 LAB — POCT URINALYSIS DIPSTICK
BILIRUBIN UA: NEGATIVE
Blood, UA: NEGATIVE
Glucose, UA: NEGATIVE
KETONES UA: NEGATIVE
LEUKOCYTES UA: NEGATIVE
Nitrite, UA: NEGATIVE
Protein, UA: NEGATIVE
Spec Grav, UA: 1.025
Urobilinogen, UA: 0.2
pH, UA: 5.5

## 2015-03-11 LAB — LDL CHOLESTEROL, DIRECT: LDL DIRECT: 118 mg/dL

## 2015-03-11 LAB — PSA: PSA: 3.93 ng/mL (ref 0.10–4.00)

## 2015-03-11 LAB — TSH: TSH: 0.67 u[IU]/mL (ref 0.35–4.50)

## 2015-03-14 ENCOUNTER — Encounter: Payer: Self-pay | Admitting: Family Medicine

## 2015-03-14 ENCOUNTER — Telehealth: Payer: Self-pay | Admitting: Family Medicine

## 2015-03-14 NOTE — Telephone Encounter (Signed)
Pt was on schedule to see Dr. Sarajane Jews for a physical today. I called and spoke with pt, he had wrote down another time. I advised pt to reschedule this.

## 2015-04-08 ENCOUNTER — Telehealth: Payer: Self-pay | Admitting: Family Medicine

## 2015-04-08 NOTE — Telephone Encounter (Signed)
Pt states he had a death in the family and missed his cpe on 9/15.   But pt did do his labs prior to his appt.   Pt forgot to reschedule his cpe, and is being billed $964.50. Pt would like to know if he can be worked in for a cpe  next week anytime so his insurance will pay for those labs.

## 2015-04-09 NOTE — Telephone Encounter (Signed)
Yes we can work him in for a cpx

## 2015-04-10 ENCOUNTER — Encounter: Payer: Self-pay | Admitting: Family Medicine

## 2015-04-10 ENCOUNTER — Ambulatory Visit (INDEPENDENT_AMBULATORY_CARE_PROVIDER_SITE_OTHER): Payer: 59 | Admitting: Family Medicine

## 2015-04-10 VITALS — BP 129/86 | HR 87 | Temp 98.4°F | Ht 67.0 in | Wt 216.0 lb

## 2015-04-10 DIAGNOSIS — Z0000 Encounter for general adult medical examination without abnormal findings: Secondary | ICD-10-CM | POA: Diagnosis not present

## 2015-04-10 DIAGNOSIS — Z8042 Family history of malignant neoplasm of prostate: Secondary | ICD-10-CM

## 2015-04-10 NOTE — Progress Notes (Signed)
Pre visit review using our clinic review tool, if applicable. No additional management support is needed unless otherwise documented below in the visit note. 

## 2015-04-10 NOTE — Telephone Encounter (Signed)
Pt has been scheduled.  °

## 2015-04-11 ENCOUNTER — Encounter: Payer: Self-pay | Admitting: Family Medicine

## 2015-04-11 NOTE — Progress Notes (Signed)
   Subjective:    Patient ID: Marcus Pruitt, male    DOB: 1970-12-14, 44 y.o.   MRN: [id number]  HPI 44 yr old male for a cpx. He feels well.    Review of Systems  Constitutional: Negative.   HENT: Negative.   Eyes: Negative.   Respiratory: Negative.   Cardiovascular: Negative.   Gastrointestinal: Negative.   Genitourinary: Negative.   Musculoskeletal: Negative.   Skin: Negative.   Neurological: Negative.   Psychiatric/Behavioral: Negative.        Objective:   Physical Exam  Constitutional: He is oriented to person, place, and time. He appears well-developed and well-nourished. No distress.  HENT:  Head: Normocephalic and atraumatic.  Right Ear: External ear normal.  Left Ear: External ear normal.  Nose: Nose normal.  Mouth/Throat: Oropharynx is clear and moist. No oropharyngeal exudate.  Eyes: Conjunctivae and EOM are normal. Pupils are equal, round, and reactive to light. Right eye exhibits no discharge. Left eye exhibits no discharge. No scleral icterus.  Neck: Neck supple. No JVD present. No tracheal deviation present. No thyromegaly present.  Cardiovascular: Normal rate, regular rhythm, normal heart sounds and intact distal pulses.  Exam reveals no gallop and no friction rub.   No murmur heard. Pulmonary/Chest: Effort normal and breath sounds normal. No respiratory distress. He has no wheezes. He has no rales. He exhibits no tenderness.  Abdominal: Soft. Bowel sounds are normal. He exhibits no distension and no mass. There is no tenderness. There is no rebound and no guarding.  Genitourinary: Rectum normal, prostate normal and penis normal. Guaiac negative stool. No penile tenderness.  Musculoskeletal: Normal range of motion. He exhibits no edema or tenderness.  Lymphadenopathy:    He has no cervical adenopathy.  Neurological: He is alert and oriented to person, place, and time. He has normal reflexes. No cranial nerve deficit. He exhibits normal muscle tone. Coordination  normal.  Skin: Skin is warm and dry. No rash noted. He is not diaphoretic. No erythema. No pallor.  Psychiatric: He has a normal mood and affect. His behavior is normal. Judgment and thought content normal.          Assessment & Plan:  Well exam. We discussed diet and exercise advice. His PSA is within the normal range but it has doubled in the last year. This, combined with the fact that his father was diagnosed with prostate cancer in his late 55s, makes me worry a bit about any developing prostate cancer. I advised him to have a follow up PSA drawn in 6 months, and he agreed.

## 2015-06-26 ENCOUNTER — Telehealth: Payer: Self-pay | Admitting: Family Medicine

## 2015-06-26 DIAGNOSIS — N138 Other obstructive and reflux uropathy: Secondary | ICD-10-CM

## 2015-06-26 DIAGNOSIS — N401 Enlarged prostate with lower urinary tract symptoms: Principal | ICD-10-CM

## 2015-06-26 NOTE — Telephone Encounter (Signed)
Pt would like a referral for Urology for elevated PSA. Pt has been having difficulty urinating at times and waking up at night to urinate.

## 2015-06-27 NOTE — Telephone Encounter (Signed)
Pt is aware.  

## 2015-06-27 NOTE — Telephone Encounter (Signed)
Referral was done  

## 2015-10-21 ENCOUNTER — Encounter: Payer: Self-pay | Admitting: Family Medicine

## 2015-10-21 ENCOUNTER — Ambulatory Visit (INDEPENDENT_AMBULATORY_CARE_PROVIDER_SITE_OTHER): Payer: 59 | Admitting: Family Medicine

## 2015-10-21 VITALS — BP 125/89 | HR 83 | Temp 98.5°F | Ht 67.0 in | Wt 224.0 lb

## 2015-10-21 DIAGNOSIS — J019 Acute sinusitis, unspecified: Secondary | ICD-10-CM

## 2015-10-21 MED ORDER — AZITHROMYCIN 250 MG PO TABS: ORAL_TABLET | ORAL | Status: DC

## 2015-10-21 NOTE — Progress Notes (Signed)
   Subjective:    Patient ID: Marcus Pruitt, male    DOB: Apr 12, 1971, 45 y.o.   MRN: [id number]  HPI Here for 3 days of sinus pressure, PND, ST, and a dry cough. On Sudafed PE.    Review of Systems  Constitutional: Negative.   HENT: Positive for congestion, postnasal drip, sinus pressure and sore throat.   Eyes: Negative.   Respiratory: Positive for cough.        Objective:   Physical Exam  Constitutional: He appears well-developed and well-nourished.  HENT:  Right Ear: External ear normal.  Left Ear: External ear normal.  Nose: Nose normal.  Mouth/Throat: Oropharynx is clear and moist.  Eyes: Conjunctivae are normal.  Neck: No thyromegaly present.  Pulmonary/Chest: Effort normal and breath sounds normal.  Lymphadenopathy:    He has no cervical adenopathy.          Assessment & Plan:  Sinusitis, treat with a Zpack.  Laurey Morale, MD

## 2015-10-21 NOTE — Progress Notes (Signed)
Pre visit review using our clinic review tool, if applicable. No additional management support is needed unless otherwise documented below in the visit note. 

## 2016-01-29 ENCOUNTER — Other Ambulatory Visit: Payer: Self-pay | Admitting: Urology

## 2016-02-11 ENCOUNTER — Encounter (HOSPITAL_COMMUNITY): Payer: Self-pay

## 2016-02-11 NOTE — Patient Instructions (Addendum)
Marcus Pruitt  02/11/2016   Your procedure is scheduled on: 02/21/16  Report to [facility]  Entrance take [facility]  elevators to 3rd floor to  [place] at  1000 AM.  Call this number if you have problems the morning of surgery [phone] BRING CPAP [place]  Remember: ONLY 1 PERSON MAY GO WITH YOU TO SHORT STAY TO GET  READY MORNING OF YOUR SURGERY.  BOWEL PREP AS PER OFFICE-  CLEAR LIQUIDS ONLY THE DAY BEFORE SURGERY---DRINK ONE BOTTLE MAGNESIUM CITRATE THE MORNING BEFORE SURGERY INCREASE FLUID INTAKE DAY BEFORE SURGERY  MAY HAVE CLEAR LIQUIDS MORNING OF SURGERY UNTIL 0600am--THEN NOTHING BY MOUTH     Take these medicines the morning of surgery with A SIP OF WATER: Allegra, Tamulosin DO NOT TAKE ANY DIABETIC MEDICATIONS DAY OF YOUR SURGERY                               You may not have any metal on your body including hair pins and              piercings  Do not wear jewelry, make-up, lotions, powders or perfumes, deodorant             Do not wear nail polish.  Do not shave  48 hours prior to surgery.              Men may shave face and neck.   Do not bring valuables to the hospital. [place].  Contacts, dentures or bridgework may not be worn into surgery.  Leave suitcase in the car. After surgery it may be brought to your room.             [place] - Preparing for Surgery Before surgery, you can play an important role.  Because skin is not sterile, your skin needs to be as free of germs as possible.  You can reduce the number of germs on your skin by washing with CHG (chlorahexidine gluconate) soap before surgery.  CHG is an antiseptic cleaner which kills germs and bonds with the skin to continue killing germs even after washing. Please DO NOT use if you have an allergy to CHG or antibacterial soaps.  If your skin becomes reddened/irritated stop using the CHG and inform your nurse  when you arrive at Short Stay. Do not shave (including legs and underarms) for at least 48 hours prior to the first CHG shower.  You may shave your face/neck. Please follow these instructions carefully:  1.  Shower with CHG Soap the night before surgery and the  morning of Surgery.  2.  If you choose to wash your hair, wash your hair first as usual with your  normal  shampoo.  3.  After you shampoo, rinse your hair and body thoroughly to remove the  shampoo.                           4.  Use CHG as you would any other liquid soap.  You can apply chg directly  to the skin and wash  Gently with a scrungie or clean washcloth.  5.  Apply the CHG Soap to your body ONLY FROM THE NECK DOWN.   Do not use on face/ open                           Wound or open sores. Avoid contact with eyes, ears mouth and genitals (private parts).                       Wash face,  Genitals (private parts) with your normal soap.             6.  Wash thoroughly, paying special attention to the area where your surgery  will be performed.  7.  Thoroughly rinse your body with warm water from the neck down.  8.  DO NOT shower/wash with your normal soap after using and rinsing off  the CHG Soap.                9.  Pat yourself dry with a clean towel.            10.  Wear clean pajamas.            11.  Place clean sheets on your bed the night of your first shower and do not  sleep with pets. Day of Surgery : Do not apply any lotions/deodorants the morning of surgery.  Please wear clean clothes to the hospital/surgery center.  FAILURE TO FOLLOW THESE INSTRUCTIONS MAY RESULT IN THE CANCELLATION OF YOUR SURGERY PATIENT SIGNATURE_________________________________  NURSE SIGNATURE__________________________________  ________________________________________________________________________    CLEAR LIQUID DIET   Foods Allowed                                                                     Foods  Excluded  Coffee and tea, regular and decaf                             liquids that you cannot  Plain Jell-O in any flavor                                             see through such as: Fruit ices (not with fruit pulp)                                     milk, soups, orange juice  Iced Popsicles                                    All solid food Carbonated beverages, regular and diet                                    Cranberry, grape and apple juices Sports drinks like Gatorade Lightly seasoned clear broth or consume(fat free) Sugar, honey syrup  Sample Menu Breakfast                                Lunch                                     Supper Cranberry juice                    Beef broth                            Chicken broth Jell-O                                     Grape juice                           Apple juice Coffee or tea                        Jell-O                                      Popsicle                                                Coffee or tea                        Coffee or tea  _____________________________________________________________________

## 2016-02-12 ENCOUNTER — Encounter: Payer: Self-pay | Admitting: Adult Health

## 2016-02-12 ENCOUNTER — Ambulatory Visit (INDEPENDENT_AMBULATORY_CARE_PROVIDER_SITE_OTHER): Payer: 59 | Admitting: Adult Health

## 2016-02-12 ENCOUNTER — Encounter (HOSPITAL_COMMUNITY): Payer: Self-pay

## 2016-02-12 ENCOUNTER — Encounter (HOSPITAL_COMMUNITY)
Admission: RE | Admit: 2016-02-12 | Discharge: 2016-02-12 | Disposition: A | Discharge status: Home / Self Care | Payer: 59 | Source: Ambulatory Visit | Attending: Urology | Admitting: Urology

## 2016-02-12 VITALS — BP 180/102 | Temp 98.1°F | Ht 67.0 in | Wt 228.0 lb

## 2016-02-12 DIAGNOSIS — Z01812 Encounter for preprocedural laboratory examination: Secondary | ICD-10-CM | POA: Diagnosis not present

## 2016-02-12 DIAGNOSIS — I10 Essential (primary) hypertension: Secondary | ICD-10-CM | POA: Diagnosis not present

## 2016-02-12 DIAGNOSIS — Z01810 Encounter for preprocedural cardiovascular examination: Secondary | ICD-10-CM | POA: Insufficient documentation

## 2016-02-12 HISTORY — DX: Sleep apnea, unspecified: G47.30

## 2016-02-12 LAB — BASIC METABOLIC PANEL
Anion gap: 8 (ref 5–15)
BUN: 11 mg/dL (ref 6–20)
CHLORIDE: 105 mmol/L (ref 101–111)
CO2: 25 mmol/L (ref 22–32)
CREATININE: 0.99 mg/dL (ref 0.61–1.24)
Calcium: 9.1 mg/dL (ref 8.9–10.3)
GFR calc Af Amer: >60 mL/min (ref >60)
GFR calc non Af Amer: >60 mL/min (ref >60)
GLUCOSE: 100 mg/dL — AB (ref 65–99)
Potassium: 4.3 mmol/L (ref 3.5–5.1)
Sodium: 138 mmol/L (ref 135–145)

## 2016-02-12 LAB — CBC
HCT: 44.3 % (ref 39.0–52.0)
Hemoglobin: 14.6 g/dL (ref 13.0–17.0)
MCH: 27.4 pg (ref 26.0–34.0)
MCHC: 33 g/dL (ref 30.0–36.0)
MCV: 83.1 fL (ref 78.0–100.0)
PLATELETS: 203 10*3/uL (ref 150–400)
RBC: 5.33 MIL/uL (ref 4.22–5.81)
RDW: 15.4 % (ref 11.5–15.5)
WBC: 5 10*3/uL (ref 4.0–10.5)

## 2016-02-12 LAB — ABO/RH: ABO/RH(D): A POS

## 2016-02-12 MED ORDER — LOSARTAN POTASSIUM 25 MG PO TABS: 25.0000 mg | ORAL_TABLET | Freq: Every day | ORAL | 3 refills | Status: DC

## 2016-02-12 NOTE — Progress Notes (Signed)
Very anxious at PAT appt- regarding this surgery. States has hx high bp but is not on meds now.  Denies any headache, or any problems other than anxious! States mother has massive stroke form high BP.  Instructed to go to Dr Sarajane Jews today and have this evaluated.  Verified understanding and states will follow up

## 2016-02-12 NOTE — Progress Notes (Signed)
Per epic note, patient was seen by NP this am and started on medication for bp

## 2016-02-12 NOTE — Progress Notes (Signed)
Subjective:    Patient ID: Marcus Pruitt, male    DOB: 06-20-1971, 45 y.o.   MRN: [id number]  HPI  45 year old male who presents to the office for high blood pressure. He was seen at his pre-op visit for prostate cancer. At the pre op visit this morning his blood pressure 153/106. Today in the office his blood pressure is 180/102 on two separate occassions.   His surgery in on August 25th  When he last saw Dr. Sarajane Jews on 09/2015 for a sinus infection his blood pressure was normal without medications.   He denies any blurred vision or headaches.   He has not taken Allegra for the last 24 hours.    Review of Systems  Constitutional: Negative.   Respiratory: Negative.   Cardiovascular: Negative.   Neurological: Negative.   All other systems reviewed and are negative.  Past Medical History:  Diagnosis Date  . Allergy   . Cancer ([place])   . Hearing loss in right ear   . Hypertension   . Knee pain   . Low back pain   . Sleep apnea     Social History   Social History  . Marital status: Married    Spouse name: N/A  . Number of children: N/A  . Years of education: N/A   Occupational History  . Not on file.   Social History Main Topics  . Smoking status: Former Smoker    Quit date: 01/28/2007  . Smokeless tobacco: Never Used  . Alcohol use 0.0 oz/week     Comment: 2 glasses liquor week  . Drug use: No  . Sexual activity: Not on file   Other Topics Concern  . Not on file   Social History Narrative  . No narrative on file    Past Surgical History:  Procedure Laterality Date  . MANDIBLE SURGERY  1990    Family History  Problem Relation Age of Onset  . Heart disease Maternal Grandmother   . Diabetes      fhx  . Hypertension      fhx  . Cancer      fhx/prostate  . Prostate cancer Father     No Known Allergies  Current Outpatient Prescriptions on File Prior to Visit  Medication Sig Dispense Refill  . ibuprofen (ADVIL,MOTRIN) 200 MG tablet Take 800 mg by mouth  daily as needed (for back pain).    . tamsulosin (FLOMAX) 0.4 MG CAPS capsule Take 0.4 mg by mouth daily.    . fexofenadine (ALLEGRA) 30 MG tablet Take 30 mg by mouth daily.     No current facility-administered medications on file prior to visit.     BP (!) 180/102   Temp 98.1 F (36.7 C) (Oral)   Ht 5\' 7"  (1.702 m)   Wt 228 lb (103.4 kg)   BMI 35.71 kg/m       Objective:   Physical Exam  Constitutional: He is oriented to person, place, and time. He appears well-developed and well-nourished. No distress.  HENT:  Head: Normocephalic and atraumatic.  Right Ear: External ear normal.  Left Ear: External ear normal.  Nose: Nose normal.  Mouth/Throat: Oropharynx is clear and moist. No oropharyngeal exudate.  Eyes: Conjunctivae and EOM are normal. Pupils are equal, round, and reactive to light. Right eye exhibits no discharge. Left eye exhibits no discharge.  Cardiovascular: Normal rate, regular rhythm, normal heart sounds and intact distal pulses.  Exam reveals no gallop and no friction rub.  No murmur heard. Pulmonary/Chest: Effort normal and breath sounds normal. No respiratory distress. He has no wheezes. He has no rales. He exhibits no tenderness.  Musculoskeletal: Normal range of motion. He exhibits no edema, tenderness or deformity.  Neurological: He is alert and oriented to person, place, and time.  Skin: Skin is warm and dry. No rash noted. He is not diaphoretic. No pallor.  Psychiatric: He has a normal mood and affect. His behavior is normal. Judgment and thought content normal.  Nursing note and vitals reviewed.      Assessment & Plan:  1. Essential hypertension - Possibly from [place]? The half like is 14.5 hours.  - losartan (COZAAR) 25 MG tablet; Take 1 tablet (25 mg total) by mouth daily.  Dispense: 30 tablet; Refill: 3 - Will start on low dose cozaar - Monitor BP at home and send me results throughout the week.  - Continue to stay off allegra - Follow up as  needed  Dorothyann Peng, NP

## 2016-02-14 NOTE — Progress Notes (Signed)
ekg reviewed by Dr Ola Spurr- no further orders

## 2016-02-20 NOTE — Anesthesia Preprocedure Evaluation (Addendum)
Anesthesia Evaluation  Patient identified by MRN, date of birth, ID band Patient awake    Reviewed: Allergy & Precautions, H&P , NPO status , Patient's Chart, lab work & pertinent test results  History of Anesthesia Complications Negative for: history of anesthetic complications  Airway Mallampati: II  TM Distance: >3 FB Neck ROM: full    Dental no notable dental hx.    Pulmonary neg pulmonary ROS, shortness of breath, sleep apnea , former smoker,    Pulmonary exam normal breath sounds clear to auscultation       Cardiovascular hypertension, negative cardio ROS Normal cardiovascular exam Rhythm:regular Rate:Normal     Neuro/Psych negative neurological ROS     GI/Hepatic negative GI ROS, Neg liver ROS,   Endo/Other  negative endocrine ROS  Renal/GU negative Renal ROS     Musculoskeletal   Abdominal   Peds  Hematology negative hematology ROS (+)   Anesthesia Other Findings   Reproductive/Obstetrics negative OB ROS                             Anesthesia Physical Anesthesia Plan  ASA: II  Anesthesia Plan: General   Post-op Pain Management:    Induction: Intravenous  Airway Management Planned: Oral ETT  Additional Equipment:   Intra-op Plan:   Post-operative Plan:   Informed Consent: I have reviewed the patients History and Physical, chart, labs and discussed the procedure including the risks, benefits and alternatives for the proposed anesthesia with the patient or authorized representative who has indicated his/her understanding and acceptance.   Dental Advisory Given  Plan Discussed with: Anesthesiologist, CRNA and Surgeon  Anesthesia Plan Comments:         Anesthesia Quick Evaluation

## 2016-02-21 ENCOUNTER — Inpatient Hospital Stay (HOSPITAL_COMMUNITY)
Admission: RE | Admit: 2016-02-21 | Discharge: 2016-02-22 | DRG: 708 | Disposition: A | Discharge status: Home / Self Care | Payer: 59 | Source: Ambulatory Visit | Attending: Urology | Admitting: Urology

## 2016-02-21 ENCOUNTER — Inpatient Hospital Stay (HOSPITAL_COMMUNITY): Payer: 59 | Admitting: Anesthesiology

## 2016-02-21 ENCOUNTER — Encounter (HOSPITAL_COMMUNITY)
Admission: RE | Disposition: A | Discharge status: Home / Self Care | Payer: Self-pay | Source: Ambulatory Visit | Attending: Urology

## 2016-02-21 ENCOUNTER — Encounter (HOSPITAL_COMMUNITY): Payer: Self-pay | Admitting: *Deleted

## 2016-02-21 DIAGNOSIS — Z87891 Personal history of nicotine dependence: Secondary | ICD-10-CM

## 2016-02-21 DIAGNOSIS — H9191 Unspecified hearing loss, right ear: Secondary | ICD-10-CM | POA: Diagnosis present

## 2016-02-21 DIAGNOSIS — N400 Enlarged prostate without lower urinary tract symptoms: Secondary | ICD-10-CM | POA: Diagnosis present

## 2016-02-21 DIAGNOSIS — G4730 Sleep apnea, unspecified: Secondary | ICD-10-CM | POA: Diagnosis present

## 2016-02-21 DIAGNOSIS — I10 Essential (primary) hypertension: Secondary | ICD-10-CM | POA: Diagnosis present

## 2016-02-21 DIAGNOSIS — C61 Malignant neoplasm of prostate: Secondary | ICD-10-CM | POA: Diagnosis present

## 2016-02-21 DIAGNOSIS — R350 Frequency of micturition: Secondary | ICD-10-CM | POA: Diagnosis present

## 2016-02-21 HISTORY — PX: ROBOT ASSISTED LAPAROSCOPIC RADICAL PROSTATECTOMY: SHX5141

## 2016-02-21 HISTORY — PX: LYMPHADENECTOMY: SHX5960

## 2016-02-21 HISTORY — PX: UMBILICAL HERNIA REPAIR: SHX196

## 2016-02-21 HISTORY — PX: INGUINAL HERNIA REPAIR: SHX194

## 2016-02-21 LAB — TYPE AND SCREEN
ABO/RH(D): A POS
Antibody Screen: NEGATIVE

## 2016-02-21 LAB — HEMOGLOBIN AND HEMATOCRIT, BLOOD
HCT: 42.8 % (ref 39.0–52.0)
HEMOGLOBIN: 14.7 g/dL (ref 13.0–17.0)

## 2016-02-21 SURGERY — PROSTATECTOMY, RADICAL, ROBOT-ASSISTED, LAPAROSCOPIC
Anesthesia: General

## 2016-02-21 MED ORDER — DEXAMETHASONE SODIUM PHOSPHATE 10 MG/ML IJ SOLN
INTRAMUSCULAR | Status: AC
  Filled 2016-02-21: qty 1

## 2016-02-21 MED ORDER — LACTATED RINGERS IV SOLN
INTRAVENOUS | Status: DC | PRN
  Administered 2016-02-21: 12:00:00 via INTRAVENOUS

## 2016-02-21 MED ORDER — ONDANSETRON HCL 4 MG/2ML IJ SOLN
INTRAMUSCULAR | Status: DC | PRN
  Administered 2016-02-21: 4 mg via INTRAVENOUS

## 2016-02-21 MED ORDER — PROPOFOL 10 MG/ML IV BOLUS
INTRAVENOUS | Status: AC
  Filled 2016-02-21: qty 20

## 2016-02-21 MED ORDER — BUPIVACAINE LIPOSOME 1.3 % IJ SUSP
INTRAMUSCULAR | Status: AC
  Filled 2016-02-21: qty 20

## 2016-02-21 MED ORDER — DEXTROSE-NACL 5-0.45 % IV SOLN
INTRAVENOUS | Status: DC
  Administered 2016-02-21 – 2016-02-22 (×2): via INTRAVENOUS

## 2016-02-21 MED ORDER — LOSARTAN POTASSIUM 50 MG PO TABS
25.0000 mg | ORAL_TABLET | Freq: Every day | ORAL | Status: DC
  Administered 2016-02-21 – 2016-02-22 (×2): 25 mg via ORAL
  Filled 2016-02-21 (×2): qty 1

## 2016-02-21 MED ORDER — LACTATED RINGERS IR SOLN
Status: DC | PRN
  Administered 2016-02-21: 1000 mL

## 2016-02-21 MED ORDER — ROCURONIUM BROMIDE 100 MG/10ML IV SOLN
INTRAVENOUS | Status: DC | PRN
  Administered 2016-02-21: 60 mg via INTRAVENOUS
  Administered 2016-02-21: 10 mg via INTRAVENOUS
  Administered 2016-02-21: 5 mg via INTRAVENOUS
  Administered 2016-02-21: 10 mg via INTRAVENOUS

## 2016-02-21 MED ORDER — LIDOCAINE HCL (CARDIAC) 20 MG/ML IV SOLN
INTRAVENOUS | Status: DC | PRN
  Administered 2016-02-21: 100 mg via INTRAVENOUS

## 2016-02-21 MED ORDER — HYDROMORPHONE HCL 2 MG/ML IJ SOLN
INTRAMUSCULAR | Status: AC
  Filled 2016-02-21: qty 1

## 2016-02-21 MED ORDER — SODIUM CHLORIDE 0.9 % IJ SOLN
INTRAMUSCULAR | Status: DC | PRN
  Administered 2016-02-21: 20 mL

## 2016-02-21 MED ORDER — PROMETHAZINE HCL 25 MG/ML IJ SOLN: 6.2500 mg | INTRAMUSCULAR | Status: DC | PRN

## 2016-02-21 MED ORDER — PROPOFOL 10 MG/ML IV BOLUS
INTRAVENOUS | Status: DC | PRN
  Administered 2016-02-21: 200 mg via INTRAVENOUS

## 2016-02-21 MED ORDER — SUGAMMADEX SODIUM 200 MG/2ML IV SOLN
INTRAVENOUS | Status: AC
Start: 2016-02-21 — End: 2016-02-21
  Filled 2016-02-21: qty 2

## 2016-02-21 MED ORDER — DIPHENHYDRAMINE HCL 50 MG/ML IJ SOLN: 12.5000 mg | Freq: Four times a day (QID) | INTRAMUSCULAR | Status: DC | PRN

## 2016-02-21 MED ORDER — FENTANYL CITRATE (PF) 100 MCG/2ML IJ SOLN
INTRAMUSCULAR | Status: AC
  Filled 2016-02-21: qty 4

## 2016-02-21 MED ORDER — MIDAZOLAM HCL 5 MG/5ML IJ SOLN
INTRAMUSCULAR | Status: DC | PRN
Start: 2016-02-21 — End: 2016-02-21
  Administered 2016-02-21: 2 mg via INTRAVENOUS

## 2016-02-21 MED ORDER — HYDROMORPHONE HCL 1 MG/ML IJ SOLN
0.5000 mg | INTRAMUSCULAR | Status: DC | PRN
  Administered 2016-02-21 (×2): 1 mg via INTRAVENOUS
  Filled 2016-02-21 (×2): qty 1

## 2016-02-21 MED ORDER — LIDOCAINE HCL (CARDIAC) 20 MG/ML IV SOLN
INTRAVENOUS | Status: AC
  Filled 2016-02-21: qty 5

## 2016-02-21 MED ORDER — CEFAZOLIN SODIUM-DEXTROSE 2-4 GM/100ML-% IV SOLN
INTRAVENOUS | Status: AC
  Filled 2016-02-21: qty 100

## 2016-02-21 MED ORDER — HYDROCODONE-ACETAMINOPHEN 7.5-325 MG PO TABS: 1.0000 | ORAL_TABLET | Freq: Once | ORAL | Status: DC | PRN

## 2016-02-21 MED ORDER — HYDROCODONE-ACETAMINOPHEN 5-325 MG PO TABS: 1.0000 | ORAL_TABLET | Freq: Four times a day (QID) | ORAL | 0 refills | Status: DC | PRN

## 2016-02-21 MED ORDER — HYDROMORPHONE HCL 1 MG/ML IJ SOLN: 0.2500 mg | INTRAMUSCULAR | Status: DC | PRN

## 2016-02-21 MED ORDER — DIPHENHYDRAMINE HCL 12.5 MG/5ML PO ELIX: 12.5000 mg | ORAL_SOLUTION | Freq: Four times a day (QID) | ORAL | Status: DC | PRN

## 2016-02-21 MED ORDER — CEFAZOLIN SODIUM-DEXTROSE 2-4 GM/100ML-% IV SOLN
2.0000 g | INTRAVENOUS | Status: AC
  Administered 2016-02-21: 2 g via INTRAVENOUS
  Filled 2016-02-21: qty 100

## 2016-02-21 MED ORDER — DEXAMETHASONE SODIUM PHOSPHATE 10 MG/ML IJ SOLN
INTRAMUSCULAR | Status: DC | PRN
  Administered 2016-02-21: 10 mg via INTRAVENOUS

## 2016-02-21 MED ORDER — MIDAZOLAM HCL 2 MG/2ML IJ SOLN
INTRAMUSCULAR | Status: AC
  Filled 2016-02-21: qty 2

## 2016-02-21 MED ORDER — ACETAMINOPHEN 500 MG PO TABS
1000.0000 mg | ORAL_TABLET | Freq: Four times a day (QID) | ORAL | Status: AC
  Administered 2016-02-21 – 2016-02-22 (×4): 1000 mg via ORAL
  Filled 2016-02-21 (×4): qty 2

## 2016-02-21 MED ORDER — BUPIVACAINE LIPOSOME 1.3 % IJ SUSP
INTRAMUSCULAR | Status: DC | PRN
  Administered 2016-02-21: 20 mL

## 2016-02-21 MED ORDER — HYDROMORPHONE HCL 1 MG/ML IJ SOLN
INTRAMUSCULAR | Status: DC | PRN
  Administered 2016-02-21: 1 mg via INTRAVENOUS
  Administered 2016-02-21: .8 mg via INTRAVENOUS
  Administered 2016-02-21: .2 mg via INTRAVENOUS

## 2016-02-21 MED ORDER — SUGAMMADEX SODIUM 200 MG/2ML IV SOLN
INTRAVENOUS | Status: DC | PRN
  Administered 2016-02-21: 200 mg via INTRAVENOUS

## 2016-02-21 MED ORDER — ROCURONIUM BROMIDE 100 MG/10ML IV SOLN
INTRAVENOUS | Status: AC
  Filled 2016-02-21: qty 1

## 2016-02-21 MED ORDER — SODIUM CHLORIDE 0.9 % IJ SOLN
INTRAMUSCULAR | Status: AC
Start: 2016-02-21 — End: 2016-02-21
  Filled 2016-02-21: qty 50

## 2016-02-21 MED ORDER — SULFAMETHOXAZOLE-TRIMETHOPRIM 800-160 MG PO TABS: 1.0000 | ORAL_TABLET | Freq: Two times a day (BID) | ORAL | 0 refills | Status: DC

## 2016-02-21 MED ORDER — SODIUM CHLORIDE 0.9 % IV BOLUS (SEPSIS): 1000.0000 mL | Freq: Once | INTRAVENOUS | Status: DC

## 2016-02-21 MED ORDER — ONDANSETRON HCL 4 MG/2ML IJ SOLN
INTRAMUSCULAR | Status: AC
  Filled 2016-02-21: qty 2

## 2016-02-21 MED ORDER — FENTANYL CITRATE (PF) 100 MCG/2ML IJ SOLN
INTRAMUSCULAR | Status: DC | PRN
  Administered 2016-02-21 (×2): 50 ug via INTRAVENOUS
  Administered 2016-02-21: 100 ug via INTRAVENOUS

## 2016-02-21 MED ORDER — ONDANSETRON HCL 4 MG/2ML IJ SOLN
4.0000 mg | INTRAMUSCULAR | Status: DC | PRN
Start: 2016-02-21 — End: 2016-02-22

## 2016-02-21 MED ORDER — OXYCODONE HCL 5 MG PO TABS
5.0000 mg | ORAL_TABLET | ORAL | Status: DC | PRN
  Administered 2016-02-21 – 2016-02-22 (×6): 5 mg via ORAL
  Filled 2016-02-21 (×6): qty 1

## 2016-02-21 SURGICAL SUPPLY — 62 items
APPLICATOR COTTON TIP 6IN STRL (MISCELLANEOUS) ×5 IMPLANT
CATH FOLEY 2WAY SLVR 18FR 30CC (CATHETERS) ×5 IMPLANT
CATH TIEMANN FOLEY 18FR 5CC (CATHETERS) ×5 IMPLANT
CHLORAPREP W/TINT 26ML (MISCELLANEOUS) ×5 IMPLANT
CLIP LIGATING HEM O LOK PURPLE (MISCELLANEOUS) ×15 IMPLANT
CLOTH BEACON ORANGE TIMEOUT ST (SAFETY) ×5 IMPLANT
CONT SPECI 4OZ STER CLIK (MISCELLANEOUS) ×5 IMPLANT
COVER SURGICAL LIGHT HANDLE (MISCELLANEOUS) ×5 IMPLANT
COVER TIP SHEARS 8 DVNC (MISCELLANEOUS) ×4 IMPLANT
COVER TIP SHEARS 8MM DA VINCI (MISCELLANEOUS) ×1
CUTTER ECHEON FLEX ENDO 45 340 (ENDOMECHANICALS) ×5 IMPLANT
DECANTER SPIKE VIAL GLASS SM (MISCELLANEOUS) ×5 IMPLANT
DRAPE ARM DVNC X/XI (DISPOSABLE) ×16 IMPLANT
DRAPE COLUMN DVNC XI (DISPOSABLE) ×4 IMPLANT
DRAPE DA VINCI XI ARM (DISPOSABLE) ×4
DRAPE DA VINCI XI COLUMN (DISPOSABLE) ×1
DRAPE SURG IRRIG POUCH 19X23 (DRAPES) ×5 IMPLANT
DRSG TEGADERM 4X4.75 (GAUZE/BANDAGES/DRESSINGS) ×10 IMPLANT
DRSG TEGADERM 6X8 (GAUZE/BANDAGES/DRESSINGS) ×10 IMPLANT
ELECT REM PT RETURN 9FT ADLT (ELECTROSURGICAL) ×5
ELECTRODE REM PT RTRN 9FT ADLT (ELECTROSURGICAL) ×4 IMPLANT
GAUZE SPONGE 2X2 8PLY STRL LF (GAUZE/BANDAGES/DRESSINGS) ×4 IMPLANT
GLOVE BIO SURGEON STRL SZ 6.5 (GLOVE) ×5 IMPLANT
GLOVE BIOGEL M STRL SZ7.5 (GLOVE) ×10 IMPLANT
GLOVE BIOGEL PI IND STRL 7.5 (GLOVE) ×4 IMPLANT
GLOVE BIOGEL PI INDICATOR 7.5 (GLOVE) ×1
GOWN STRL REUS W/TWL LRG LVL3 (GOWN DISPOSABLE) ×10 IMPLANT
GOWN STRL REUS W/TWL LRG LVL4 (GOWN DISPOSABLE) ×15 IMPLANT
HOLDER FOLEY CATH W/STRAP (MISCELLANEOUS) ×5 IMPLANT
IRRIG SUCT STRYKERFLOW 2 WTIP (MISCELLANEOUS) ×5
IRRIGATION SUCT STRKRFLW 2 WTP (MISCELLANEOUS) ×4 IMPLANT
IV LACTATED RINGERS 1000ML (IV SOLUTION) ×5 IMPLANT
KIT PROCEDURE DA VINCI SI (MISCELLANEOUS) ×1
KIT PROCEDURE DVNC SI (MISCELLANEOUS) ×4 IMPLANT
LIQUID BAND (GAUZE/BANDAGES/DRESSINGS) ×10 IMPLANT
NEEDLE INSUFFLATION 14GA 120MM (NEEDLE) ×5 IMPLANT
NEEDLE SPNL 22GX7 QUINCKE BK (NEEDLE) ×5 IMPLANT
PACK ROBOT UROLOGY CUSTOM (CUSTOM PROCEDURE TRAY) ×5 IMPLANT
PAD POSITIONING PINK XL (MISCELLANEOUS) ×5 IMPLANT
PORT ACCESS TROCAR AIRSEAL 12 (TROCAR) ×4 IMPLANT
PORT ACCESS TROCAR AIRSEAL 5M (TROCAR) ×1
RELOAD GREEN ECHELON 45 (STAPLE) ×5 IMPLANT
SEAL CANN UNIV 5-8 DVNC XI (MISCELLANEOUS) ×16 IMPLANT
SEAL XI 5MM-8MM UNIVERSAL (MISCELLANEOUS) ×4
SET TRI-LUMEN FLTR TB AIRSEAL (TUBING) ×5 IMPLANT
SHEET LAVH (DRAPES) ×5 IMPLANT
SOLUTION ELECTROLUBE (MISCELLANEOUS) ×5 IMPLANT
SPONGE GAUZE 2X2 STER 10/PKG (GAUZE/BANDAGES/DRESSINGS) ×1
SPONGE LAP 4X18 X RAY DECT (DISPOSABLE) ×5 IMPLANT
SUT ETHILON 3 0 PS 1 (SUTURE) ×10 IMPLANT
SUT MNCRL AB 4-0 PS2 18 (SUTURE) ×10 IMPLANT
SUT PDS AB 1 CT1 27 (SUTURE) ×10 IMPLANT
SUT PROLENE 1 CT 1 30 (SUTURE) ×25 IMPLANT
SUT VIC AB 2-0 SH 27 (SUTURE) ×2
SUT VIC AB 2-0 SH 27X BRD (SUTURE) ×8 IMPLANT
SUT VICRYL 0 UR6 27IN ABS (SUTURE) ×10 IMPLANT
SUT VLOC BARB 180 ABS3/0GR12 (SUTURE) ×20
SUTURE VLOC BRB 180 ABS3/0GR12 (SUTURE) ×16 IMPLANT
SYR 27GX1/2 1ML LL SAFETY (SYRINGE) ×5 IMPLANT
TOWEL OR 17X26 10 PK STRL BLUE (TOWEL DISPOSABLE) ×5 IMPLANT
TOWEL OR NON WOVEN STRL DISP B (DISPOSABLE) ×5 IMPLANT
WATER STERILE IRR 1500ML POUR (IV SOLUTION) ×10 IMPLANT

## 2016-02-21 NOTE — Brief Op Note (Signed)
02/21/2016  4:23 PM  PATIENT:  Marcus Pruitt  45 y.o. male  PRE-OPERATIVE DIAGNOSIS:  PROSTATE CANCER, LARGE PROSTATE  POST-OPERATIVE DIAGNOSIS:  PROSTATE CANCER, LARGE PROSTATE, Umbilical Hernia, Left Inguinal Hernia  PROCEDURE:  Procedure(s): XI ROBOTIC ASSISTED LAPAROSCOPIC RADICAL PROSTATECTOMY (N/A) PELVIC LYMPHADENECTOMY (Bilateral) LAPAROSCOPIC INGUINAL HERNIA (Left) LAPAROSCOPIC UMBILICAL HERNIA  SURGEON:  Surgeon(s) and Role:    * Alexis Frock, MD - Primary  PHYSICIAN ASSISTANT:   ASSISTANTS: Debbrah Alar, PA   ANESTHESIA:   local and general  EBL:  Total I/O In: 1000 [I.V.:1000] Out: 100 [Blood:100]  BLOOD ADMINISTERED:none  DRAINS: 1 - JP to bulb, 2 - Foley to gravtiy   LOCAL MEDICATIONS USED:  MARCAINE     SPECIMEN:  Source of Specimen:  1 - prostatectomy, 2 - bilateral pelvic lymph nodes  DISPOSITION OF SPECIMEN:  PATHOLOGY  COUNTS:  YES  TOURNIQUET:  * No tourniquets in log *  DICTATION: .Other Dictation: Dictation Number [id number]  PLAN OF CARE: Admit to inpatient   PATIENT DISPOSITION:  PACU - hemodynamically stable.   Delay start of Pharmacological VTE agent (>24hrs) due to surgical blood loss or risk of bleeding: yes

## 2016-02-21 NOTE — Discharge Instructions (Signed)
1- Drain Sites - You may have some mild persistent drainage from old drain site for several days, this is normal. This can be covered with cotton gauze for convenience.  2 - Stiches - Your stitches are all dissolvable. You may notice a "loose thread" at your incisions, these are normal and require no intervention. You may cut them flush to the skin with fingernail clippers if needed for comfort.  3 - Diet - No restrictions  4 - Activity - No heavy lifting / straining (any activities that require valsalva or "bearing down") x 4 weeks. Otherwise, no restrictions.  5 - Bathing - You may shower immediately. Do not take a bath or get into swimming pool where incision sites are submersed in water x 4 weeks.   6 - Catheter - Will remain in place until removed at your next appointment. It may be cleaned with soap and water in the shower. It may be disconnected from the drain bag while in the shower to avoid tripping over the tube. You may apply Neosporin or Vaseline ointment as needed to the tip of the penis where the catheter inserts to reduce friction and irritation in this spot.   You may resume aspirin, advil, aleve, vitamins, and supplements 7 days after surgery.  7 - When to Call the Doctor - Call MD for any fever >102, any acute wound problems, or any severe nausea / vomiting. You can call the [facility] [phone]) 24 hours a day 365 days a year. It will roll-over to the answering service and on-call physician after hours.

## 2016-02-21 NOTE — Transfer of Care (Signed)
Immediate Anesthesia Transfer of Care Note  Patient: Marcus Pruitt  Procedure(s) Performed: Procedure(s): XI ROBOTIC ASSISTED LAPAROSCOPIC RADICAL PROSTATECTOMY (N/A) PELVIC LYMPHADENECTOMY (Bilateral) LAPAROSCOPIC INGUINAL HERNIA (Left) LAPAROSCOPIC UMBILICAL HERNIA  Patient Location: PACU  Anesthesia Type:General  Level of Consciousness:  sedated, patient cooperative and responds to stimulation  Airway & Oxygen Therapy:Patient Spontanous Breathing and Patient connected to face mask oxgen  Post-op Assessment:  Report given to PACU RN and Post -op Vital signs reviewed and stable  Post vital signs:  Reviewed and stable  Last Vitals:  Vitals:   02/21/16 0958  BP: (!) 145/106  Pulse: 86  Resp: 18  Temp: [id number] C    Complications: No apparent anesthesia complications

## 2016-02-21 NOTE — Anesthesia Postprocedure Evaluation (Signed)
Anesthesia Post Note  Patient: Marcus Pruitt  Procedure(s) Performed: Procedure(s) (LRB): XI ROBOTIC ASSISTED LAPAROSCOPIC RADICAL PROSTATECTOMY (N/A) PELVIC LYMPHADENECTOMY (Bilateral) LAPAROSCOPIC INGUINAL HERNIA (Left) LAPAROSCOPIC UMBILICAL HERNIA  Patient location during evaluation: PACU Anesthesia Type: General Level of consciousness: awake and alert Pain management: pain level controlled Vital Signs Assessment: post-procedure vital signs reviewed and stable Respiratory status: spontaneous breathing, nonlabored ventilation, respiratory function stable and patient connected to nasal cannula oxygen Cardiovascular status: blood pressure returned to baseline and stable Postop Assessment: no signs of nausea or vomiting Anesthetic complications: no    Last Vitals:  Vitals:   02/21/16 1715 02/21/16 1739  BP: (!) 148/97 (!) 161/98  Pulse: 95 97  Resp: 15 18  Temp:  36.8 C    Last Pain:  Vitals:   02/21/16 1715  TempSrc:   PainSc: 8                  Ethan Clayburn,JAMES TERRILL

## 2016-02-21 NOTE — Anesthesia Procedure Notes (Signed)
Procedure Name: Intubation Date/Time: 02/21/2016 12:31 PM Performed by: Carleene Cooper A Pre-anesthesia Checklist: Patient identified, Emergency Drugs available, Suction available, Patient being monitored and Timeout performed Patient Re-evaluated:Patient Re-evaluated prior to inductionOxygen Delivery Method: Circle system utilized Preoxygenation: Pre-oxygenation with 100% oxygen Intubation Type: IV induction Ventilation: Mask ventilation without difficulty Laryngoscope Size: Mac and 4 Grade View: Grade I Tube type: Oral Tube size: 7.5 mm Number of attempts: 1 Airway Equipment and Method: Stylet Placement Confirmation: ETT inserted through vocal cords under direct vision,  positive ETCO2 and breath sounds checked- equal and bilateral Secured at: 23 cm Tube secured with: Tape Dental Injury: Teeth and Oropharynx as per pre-operative assessment

## 2016-02-21 NOTE — H&P (Signed)
Marcus Pruitt is an 45 y.o. male.    Chief Complaint: Pre-op Radical Prostatectomy  HPI:   1 - Urinary Frequency / lower urinary tract symptoms - slowly progressive bother from mostly obstructrive symptoms with some weak stream, start/stop flow, nocutria x1. DRE 2017 60gm+ smooth. No h/o GU trauma / STD. No incontinence. TRUS with massive prostate at 149mL. Now on tamsulosin.   2 - Low Risk Prostate Cancer in Very Large Prostate - pt's father with prostate cancer in 39s. PSA rise astutely noted by Dr. Sarajane Pruitt 2016. 2012 - PSA 1.95 02/2015 - PSA 3.95 07/2015 - ==> Biopsy TRUS 165mL Gleason 6 disease in 3 cores 30% RMM, 20% RMA, 20% LLB.   PMH sig for jaw surgery. NO CV disese. NO strong blood thinners. 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 His PCP is Marcus Penna MD with Marcus Pruitt.  Today "Marcus Pruitt" is seen to proceed with robotic prostatecotmy. He has had second opinion and is well versed in peri-op course and goals of care.     Past Medical History:  Diagnosis Date  . Allergy   . Cancer ([place])   . Hearing loss in right ear   . Hypertension   . Knee pain   . Low back pain   . Sleep apnea     Past Surgical History:  Procedure Laterality Date  . MANDIBLE SURGERY  1990    Family History  Problem Relation Age of Onset  . Heart disease Maternal Grandmother   . Diabetes      fhx  . Hypertension      fhx  . Cancer      fhx/prostate  . Prostate cancer Father    Social History:  reports that he quit smoking about 9 years ago. He has never used smokeless tobacco. He reports that he drinks alcohol. He reports that he does not use drugs.  Allergies: No Known Allergies  No prescriptions prior to admission.    No results found for this or any previous visit (from the past 48 hour(s)). No results found.  Review of Systems  Constitutional: Negative.  Negative for chills and fever.  HENT: Negative.   Eyes: Negative.   Respiratory: Negative.   Cardiovascular: Negative.    Gastrointestinal: Negative.   Genitourinary: Negative.   Musculoskeletal: Negative.   Skin: Negative.   Neurological: Negative.   Endo/Heme/Allergies: Negative.   Psychiatric/Behavioral: Negative.     There were no vitals taken for this visit. Physical Exam  Constitutional: He appears well-developed.  HENT:  Head: Normocephalic.  Eyes: Pupils are equal, round, and reactive to light.  Neck: Normal range of motion.  Cardiovascular: Normal rate.   Respiratory: Effort normal.  GI: Soft.  Genitourinary:  Genitourinary Comments: NO CVAT.   Musculoskeletal: Normal range of motion.  Neurological: He is alert.  Skin: Skin is warm.  Psychiatric: He has a normal mood and affect. His behavior is normal. Judgment and thought content normal.     Assessment/Plan  1 - Urinary Frequency / lower urinary tract symptoms - likely from massive for age prostate with both benign and malignant components. Hopefully prostatectomy will mitigate otherwise progressive course of this.   2 - Low Risk Prostate Cancer in Very Large Prostate - significant volume disease in massive prostate. We have discussed management options in detail and he has had 2nd opinions and want to proceed with prostatectomy as planned today.  We discussed prostatectomy and specifically robotic prostatectomy with bilateral pelvic lymphadenectomy being the technique  that I most commonly perform. I showed the patient on their abdomen the approximately 6 small incision (trocar) sites as well as presumed extraction sites with robotic approach as well as possible open incision sites should open conversion be necessary. We discussed peri-operative risks including bleeding, infection, deep vein thrombosis, pulmonary embolism, compartment syndrome, nuropathy / neuropraxia, heart attack, stroke, death, as well as long-term risks such as non-cure / need for additional therapy. We specifically addressed that the procedure would compromise urinary  control leading to stress incontinence which typically resolves with time and pelvic rehabilitation (Kegel's, etc..), but can sometimes be permanent and require additional therapy including surgery. We also specifically addressed sexual sequellae including significant erectile dysfunction which typically partially resolves with time but can also be permanent and require additional therapy including surgery.   We discussed the typical hospital course including usual 1-2 night hospitalization, discharge with foley catheter in place usually for 1-2 weeks before voiding trial as well as usually 2 week recovery until able to perform most non-strenuous activity and 6 weeks until able to return to most jobs and more strenuous activity such as exercise.    Marcus Frock, MD 02/21/2016, 6:49 AM

## 2016-02-22 LAB — BASIC METABOLIC PANEL
ANION GAP: 6 (ref 5–15)
BUN: 10 mg/dL (ref 6–20)
CHLORIDE: 101 mmol/L (ref 101–111)
CO2: 26 mmol/L (ref 22–32)
Calcium: 8.6 mg/dL — ABNORMAL LOW (ref 8.9–10.3)
Creatinine, Ser: 1.2 mg/dL (ref 0.61–1.24)
GFR calc Af Amer: >60 mL/min (ref >60)
Glucose, Bld: 123 mg/dL — ABNORMAL HIGH (ref 65–99)
POTASSIUM: 4.1 mmol/L (ref 3.5–5.1)
SODIUM: 133 mmol/L — AB (ref 135–145)

## 2016-02-22 LAB — HEMOGLOBIN AND HEMATOCRIT, BLOOD
HEMATOCRIT: 37.7 % — AB (ref 39.0–52.0)
Hemoglobin: 12.6 g/dL — ABNORMAL LOW (ref 13.0–17.0)

## 2016-02-22 LAB — CREATININE, FLUID (PLEURAL, PERITONEAL, JP DRAINAGE): CREAT FL: 1.2 mg/dL

## 2016-02-22 NOTE — Progress Notes (Signed)
No events overnight Tolerating clears No n/v/f/c/bm/flatus Pain controlled with PO meds 430 from JP last 24 hrs  Vitals:   02/21/16 2325 02/22/16 0100 02/22/16 0421 02/22/16 0959  BP: 116/79  128/87 127/83  Pulse: 95 93 85 77  Resp: 18 18 16 17   Temp: 98.6 F (37 C)  98.5 F (36.9 C) 97.9 F (36.6 C)  TempSrc: Oral  Oral Oral  SpO2: 100% 99% 99% 97%  Weight:      Height:       I/O last 3 completed shifts: In: 4347.9 [I.V.:4347.9] Out: 2100 [Urine:1600; Drains:400; Blood:100] Total I/O In: -  Out: 705 [Urine:675; Drains:30]  NAD Soft approp t mild/mod d inc c/di Foley clear JP ss  Hemoglobin & Hematocrit     Component Value Date/Time   HGB 12.6 (L) 02/22/2016 0355   HCT 37.7 (L) 02/22/2016 0355   BMP Latest Ref Rng & Units 02/22/2016 02/12/2016 03/11/2015  Glucose 65 - 99 mg/dL 123(H) 100(H) 87  BUN 6 - 20 mg/dL 10 11 11   Creatinine 0.61 - 1.24 mg/dL 1.20 0.99 1.09  Sodium 135 - 145 mmol/L 133(L) 138 140  Potassium 3.5 - 5.1 mmol/L 4.1 4.3 4.2  Chloride 101 - 111 mmol/L 101 105 103  CO2 22 - 32 mmol/L 26 25 29   Calcium 8.9 - 10.3 mg/dL 8.6(L) 9.1 9.6   POD 1 RALP/BPLND. Doing well -advance diet -continue foley -check JP Cr -possible d/c JP today -possible d/c home with foley this afternoon

## 2016-02-22 NOTE — Op Note (Signed)
Marcus Pruitt, Marcus Pruitt                  ACCOUNT NO.:  [id number]  MEDICAL RECORD NO.:  [id number]  LOCATION:  [id number]                         FACILITY:  [facility]  PHYSICIAN:  Alexis Frock, MD     DATE OF BIRTH:  22-Nov-1970  DATE OF PROCEDURE: 02/21/2016                               OPERATIVE REPORT   PREOPERATIVE DIAGNOSIS:  Low-risk prostate cancer and very large prostate.  POSTOPERATIVE DIAGNOSES:  Low-risk prostate cancer and very large prostate plus umbilical hernia plus left inguinal hernia.  PROCEDURE: 1. Robotic-assisted laparoscopic radical prostatectomy. 2. Bilateral pelvic lymphadenectomy. 3. Umbilical hernia repair. 4. Left inguinal hernia repair.  ESTIMATED BLOOD LOSS:  100 mL.  COMPLICATION:  None.  SPECIMENS: 1. Radical prostatectomy. 2. Right external iliac lymph nodes. 3. Right obturator lymph nodes. 4. Left external iliac lymph nodes. 5. Left obturator lymph nodes.  DRAINS: 1. Jackson-Pratt drain to bulb suction. 2. Foley catheter to straight drain.  ASSISTANT:  Debbrah Alar, PA.  FINDINGS: 1. Very large prostate with very large seminal vesicles as     anticipated. 2. Approximately 1.5 cm defect umbilical hernia. 3. Approximately 2 cm defect left inguinal hernia.  INDICATION:  Marcus Pruitt is a very pleasant 45 year old gentleman, who was found on workup to have elevated PSA and strong family history of prostate cancer to harbor multifocal low-risk adenocarcinoma of the prostate.  His prostate volume is quite large at over 130 mL.  Options were discussed for management including surveillance protocols versus ablative therapy versus surgical extirpation and he adamantly wished to proceed with the latter after obtaining second opinions.  He received perioperative education and physical therapy.  Informed consent was obtained and placed in the medical record.  PROCEDURE IN DETAIL:  The patient being Marcus Pruitt was verified. Procedure being radical  prostatectomy was confirmed.  Procedure was carried out.  Time-out was performed.  Intravenous antibiotics were administered.  General endotracheal anesthesia was introduced.  The patient was placed into a low lithotomy position and sterile field was created by prepping and draping the patient's penis, perineum, and proximal thighs using iodine x3 and his entire abdomen using chlorhexidine gluconate.  He was placed into 23 degrees steep Trendelenburg positioning after he was further fashioned on the operative table using 3-inch tape over foam padding across his chest. He was found to be suitably positioned.  With further examination of the abdominal wall, it became readily apparent that the patient had a small reducible umbilical hernia.  The fascial defect was approximately 1.5 cm.  Therefore, decided to incorporate this extraction site as to allow for repair of this with closure of the end.  As such, a high-flow low pressure pneumoperitoneum was obtained using Veress technique in the supraumbilical midline having passed the aspiration and drop test.  An 8 mm robotic camera port was placed into this location.  Laparoscopic examination of the peritoneal cavity revealed no significant adhesions and no visceral injury.  Additional ports were then placed as follows; right paramedian 8-mm robotic port, right far lateral 12-mm AirSeal assistant port, right paramedian 5-mm suction port, left paramedian 8-mm robotic port, left far lateral 8-mm robotic port.  Robot was docked and passed through  the electronic checks.  Next, attention was directed at development of space of Retzius.  Incision was made lateral to the right medial umbilical ligament from the midline towards the area of the internal ring and coursing along the iliac vessels towards the area of the ureter.  The vas deferens was seen and purposely ligated using medial bucket-handle in the lateral right bladder wall and swept away from  the pelvic sidewall towards the area of the endopelvic fascia.  A mirror-imaged dissection was performed on the left side.  Anterior attachments were taken down using cautery scissors.  The area of the base of the prostate and bladder junction was defatted with his fat set aside for permanent pathology, labeled as periprostatic fat.  The endopelvic fascia was identified and carefully swept away from the lateral aspect of the prostate in a base-to-apex orientation.  There was notable excess reap into artery and vein on the left side, which were purposely preserved.  This exposed the dorsal venous complex, which was carefully controlled by using stapling technique after verifying no occlusion of the urethra.  This resulted in excellent hemostatic control of the dorsal venous complex.  Next, limited pelvic lymphadenectomy was performed, first on the right side with the confines first being the external iliac group with the right external iliac artery, vein, pelvic side wall, and iliac bifurcation being the limits of the dissection. Hemostasis was achieved with cold clips, set aside, labeled right external iliac lymph nodes.  Next, right obturator group was carefully dissected free with confines being right external iliac vein, pelvic sidewall, and obturator nerve.  Hemostasis was again achieved with cold clips.  The obturator nerve was inspected following these maneuvers and found to be intact and uninjured.  A mirror-image lymphadenectomy was performed on the left side, left external iliac and left obturator groups respectively.  The left obturator nerve was inspected and found to be uninjured following these maneuvers.  Next, the bladder neck was identified by moving the Foley catheter back and forth in the anterior plane.  A lateral release technique was used to further define the bladder neck and prostate junction.  The prostate was quite large as anticipated and very careful bladder neck  dissection was performed to avoid excessive caliber, keeping what appeared to be a rim of circular muscle fibers on each side of the specimen.  A very small median bar was found.  A figure-of-eight stitch was placed through this and placed on gentle superior traction and allowed visualization of the posterior aspect of the bladder neck, which was carefully transected to avoid any injury to the ureteral orifices which did not occur.  Posterior dissection was performed by incising approximately 8 mm inferoposterior to the posterior lip of the prostate entering the plane of Denonvilliers.  Bilateral seminal vesicles were encountered, dissected for distance approximately 5 cm, ligated and placed on gentle superior traction.  Bilateral seminal vesicles were dissected to the tip and also placed on gentle superior traction.  All of these structures were quite large.  The posterior dissection was continued inferiorly towards the apex of the prostate within the plane of Denonvilliers.  This exposed the vascular pedicles bilaterally.  These were controlled using a sequential clipping technique from the base to apex.  A moderately aggressive nerve sparing was performed bilaterally sweeping the preserved neurovascular tissue away from the midline apical portions of the prostate and pedicle tissue.  Apical dissection was performed in the anterior plane by placing the prostate on a superior traction  and entering the catheter was in situ.  The membranous urethra was coldly transected keeping what appeared to be adequate membranous urethral stump, and the prostatectomy specimen was placed into an EndoCatch bag for later retrieval.  Digital rectal exam was performed with indicator glove under laparoscopic vision.  No evidence of rectal violation was noted.  Posterior dissection was performed using a single V-Loc suture reapproximating the posterior urethral plate to the posterior bladder neck bringing  these structures in tension-free apposition.  Mucosa-to- mucosa urethral bladder neck anastomosis was performed using a double- armed V-Loc suture from the 6 o'clock to 12 o'clock position.  A new Foley catheter was then placed, which irrigated quantitatively without any evidence of leak.  The pelvis was further inspected and there was found to be significant left inguinal hernia and that was unmasked inherently by developing the space of Retzius.  The defect appeared to be approximately 2 cm.  It was felt that the closure would clearly be warranted.  As such, the hernia sac was dissected free and the fascial defect was reapproximated from the periosteum of the pubis to the healthy fascia using running Prolene, which completely ablated the hernia defect.  Robot was then undocked.  A closed suction drain was brought through the previous left robotic port site near the peritoneal cavity.  The right lateral most assistant port site was closed with fascia using Carter-Thomason suture passer and 0 Vicryl.  The specimen was retrieved by extending the previous camera port inferiorly purposely through the small umbilical hernia defect for an extraction site being approximately 4 cm in total distance.  The prostatectomy specimen was set aside for permanent pathology.  This was closed at the level of fascia using figure-of-eight Prolene x6, thus inherently performing umbilical hernia repair bringing the fascia back into apposition. Scarpa's reapproximated with running Vicryl.  All incision sites were infiltrated with dilute lyophilized Marcaine and closed at the level of the skin using subcuticular Monocryl followed by Dermabond and procedure was terminated.  The patient tolerated the procedure well.  There were no immediate periprocedural complications.  The patient was taken to the postanesthesia care unit in stable condition.          ______________________________ Alexis Frock,  MD     TM/MEDQ  D:  02/21/2016  T:  02/22/2016  Job:  [id number]

## 2016-02-22 NOTE — Progress Notes (Signed)
Completed D/C teaching. Answered questions. Removed JP and educated patient on taking care of the cite. Demonstrated foley care. Gave prescriptions. Pt will be D/C home with family in stable condition

## 2016-02-23 NOTE — Discharge Summary (Signed)
Date of admission: 02/21/2016  Date of discharge: 02/23/2016  Admission diagnosis: Prostate cancer  Discharge diagnosis: Prostate cancer  Secondary diagnoses:  Patient Active Problem List   Diagnosis Date Noted  . Prostate cancer ([place]) 02/21/2016  . Blepharitis of eyelid of right eye 05/08/2014  . HTN (hypertension) 03/30/2014  . OBSTRUCTIVE SLEEP APNEA 08/04/2010  . SNORING 07/21/2010  . ERECTILE DYSFUNCTION, ORGANIC 06/13/2010  . STRESS REACTION, ACUTE, WITH EMOTIONAL DISTURBANCE 04/18/2009  . INSOMNIA 04/18/2009  . Unspecified hearing loss 03/06/2009  . ALLERGIC RHINITIS 03/06/2009  . KNEE PAIN, BILATERAL 03/06/2009  . LOW BACK PAIN 03/06/2009  . PES PLANUS 03/06/2009    History and Physical: For full details, please see admission history and physical. Briefly, Marcus Pruitt is a 45 y.o. year old patient with prostate cancer who underwent a robotic prostatectomy and pelvic lymph node dissection.   Hospital Course: Patient tolerated the procedure well.  He was then transferred to the floor after an uneventful PACU stay.  His hospital course was uncomplicated.  On POD#1 he had met discharge criteria: was eating a regular diet, was up and ambulating independently,  pain was well controlled,JP was removed, and was ready to for discharge home with foley.   Laboratory values:   Recent Labs  02/21/16 1706 02/22/16 0355  HGB 14.7 12.6*  HCT 42.8 37.7*    Recent Labs  02/22/16 0355  NA 133*  K 4.1  CL 101  CO2 26  GLUCOSE 123*  BUN 10  CREATININE 1.20  CALCIUM 8.6*   No results for input(s): LABPT, INR in the last 72 hours. No results for input(s): LABURIN in the last 72 hours. No results found for this or any previous visit.  Disposition: Home  Discharge instruction: The patient was instructed to be ambulatory but told to refrain from heavy lifting, strenuous activity, or driving.   Discharge medications:   Medication List    STOP taking these medications    ibuprofen 200 MG tablet Commonly known as:  ADVIL,MOTRIN   tamsulosin 0.4 MG Caps capsule Commonly known as:  FLOMAX     TAKE these medications   fexofenadine 30 MG tablet Commonly known as:  ALLEGRA Take 30 mg by mouth daily.   HYDROcodone-acetaminophen 5-325 MG tablet Commonly known as:  NORCO Take 1-2 tablets by mouth every 6 (six) hours as needed for moderate pain.   losartan 25 MG tablet Commonly known as:  COZAAR Take 1 tablet (25 mg total) by mouth daily.   sulfamethoxazole-trimethoprim 800-160 MG tablet Commonly known as:  BACTRIM DS,SEPTRA DS Take 1 tablet by mouth 2 (two) times daily. Start the day prior to foley removal appointment       Followup:  Follow-up Information    Alexis Frock, MD On 03/03/2016.   Specialty:  Urology Why:  at 8:45 AM for MD visit and catheter removal. Dr. Tresa Moore will call you with pathology results when available.  Contact information: [place]  [phone]

## 2016-03-18 ENCOUNTER — Encounter: Payer: Self-pay | Admitting: Family Medicine

## 2016-03-18 ENCOUNTER — Ambulatory Visit (INDEPENDENT_AMBULATORY_CARE_PROVIDER_SITE_OTHER): Payer: 59 | Admitting: Family Medicine

## 2016-03-18 VITALS — BP 140/92 | Temp 98.3°F | Ht 67.0 in | Wt 219.0 lb

## 2016-03-18 DIAGNOSIS — I10 Essential (primary) hypertension: Secondary | ICD-10-CM | POA: Diagnosis not present

## 2016-03-18 NOTE — Progress Notes (Signed)
   Subjective:    Patient ID: Kieran Rimer, male    DOB: 1971-05-17, 45 y.o.   MRN: [id number]  HPI Here to follow up on BP. He had a prostatectomy about 3 weeks ago and this seems to have gone very well. He has lost a few pounds over the past month and he asks if he can stop taking is BP medication. At home he had been getting readings in the 120s to130s over 80s, but he ran out of Losartan 4 days ago. As noted his BP has jumped back up today. No chest pain or SOB.    Review of Systems  Constitutional: Negative.   Respiratory: Negative.   Cardiovascular: Negative.   Neurological: Negative.        Objective:   Physical Exam  Constitutional: He is oriented to person, place, and time. He appears well-developed and well-nourished.  Neck: No thyromegaly present.  Cardiovascular: Normal rate, regular rhythm, normal heart sounds and intact distal pulses.   Pulmonary/Chest: Effort normal and breath sounds normal.  Musculoskeletal: He exhibits no edema.  Lymphadenopathy:    He has no cervical adenopathy.  Neurological: He is alert and oriented to person, place, and time.          Assessment & Plan:  His HTN has been controlled on medication but he obviously still needs to be taking Losartan. We will refill this today. He will work on diet and exercise and recheck in 90 days.  Laurey Morale, MD

## 2016-03-18 NOTE — Progress Notes (Signed)
Pre visit review using our clinic review tool, if applicable. No additional management support is needed unless otherwise documented below in the visit note. 

## 2016-03-31 ENCOUNTER — Ambulatory Visit (INDEPENDENT_AMBULATORY_CARE_PROVIDER_SITE_OTHER): Payer: 59 | Admitting: Family Medicine

## 2016-03-31 ENCOUNTER — Encounter: Payer: Self-pay | Admitting: Family Medicine

## 2016-03-31 ENCOUNTER — Telehealth: Payer: Self-pay | Admitting: Family Medicine

## 2016-03-31 VITALS — BP 140/92 | Temp 98.3°F | Ht 67.0 in | Wt 222.0 lb

## 2016-03-31 DIAGNOSIS — I10 Essential (primary) hypertension: Secondary | ICD-10-CM | POA: Diagnosis not present

## 2016-03-31 MED ORDER — LOSARTAN POTASSIUM-HCTZ 50-12.5 MG PO TABS
1.0000 | ORAL_TABLET | Freq: Every day | ORAL | 3 refills | Status: DC
Start: 2016-03-31 — End: 2016-04-07

## 2016-03-31 NOTE — Telephone Encounter (Signed)
FYI, pt coming in today.

## 2016-03-31 NOTE — Progress Notes (Signed)
Pre visit review using our clinic review tool, if applicable. No additional management support is needed unless otherwise documented below in the visit note. 

## 2016-03-31 NOTE — Progress Notes (Signed)
   Subjective:    Patient ID: Marcus Pruitt, male    DOB: 01/12/1971, 45 y.o.   MRN: [id number]  HPI Here to follow up on BP. He has been taking Losartan 25 mg daily and he has felt fine. However his BP has been consistently high. He usually gets readings of 150s over 90s at home, but in the urologist's office yesterday they got 167/115.    Review of Systems  Constitutional: Negative.   Respiratory: Negative.   Cardiovascular: Negative.   Gastrointestinal: Negative.   Neurological: Negative.        Objective:   Physical Exam  Constitutional: He is oriented to person, place, and time. He appears well-developed and well-nourished.  Neck: No thyromegaly present.  Cardiovascular: Normal rate, regular rhythm, normal heart sounds and intact distal pulses.   Pulmonary/Chest: Effort normal and breath sounds normal.  Musculoskeletal: He exhibits no edema.  Lymphadenopathy:    He has no cervical adenopathy.  Neurological: He is alert and oriented to person, place, and time.          Assessment & Plan:  HTN, we will switch to Losartan HCT 50/12.5 daily. Recheck in 2-3 weeks. Laurey Morale, MD

## 2016-03-31 NOTE — Telephone Encounter (Signed)
Patient Name: Marcus Pruitt  DOB: [birth year]    Initial Comment Caller states on BP meds; on it 6 wks; 167/115, wants to know if can double dosage; no sx;    Nurse Assessment  Nurse: Mallie Mussel, RN, Alveta Heimlich Date/Time (Eastern Time): 03/31/2016 12:41:01 PM  Confirm and document reason for call. If symptomatic, describe symptoms. You must click the next button to save text entered. ---Caller states that he is on Losartan 25mg  1 PO Daily. He has been taking this 6 weeks. BP was 167/115 at his urologist office about an hour ago. He cannot take his BP now. Denies headache. He denies unilateral weakness or numbness. Denies blurry vision.  Has the patient traveled out of the country within the last 30 days? ---No  Does the patient have any new or worsening symptoms? ---Yes  Will a triage be completed? ---Yes  Related visit to physician within the last 2 weeks? ---No  Does the PT have any chronic conditions? (i.e. diabetes, asthma, etc.) ---Yes  List chronic conditions. ---HTN, Prostate CA  Is this a behavioral health or substance abuse call? ---No     Guidelines    Guideline Title Affirmed Question Affirmed Notes  High Blood Pressure BP ? 160/100    Final Disposition User   See PCP When Office is Open (within 3 days) Mallie Mussel, RN, Alveta Heimlich    Comments  Appointment scheduled for today at 4:00pm with Dr. Orbie Pyo.   Referrals  REFERRED TO PCP OFFICE   Disagree/Comply: Comply

## 2016-04-07 ENCOUNTER — Encounter: Payer: Self-pay | Admitting: Family Medicine

## 2016-04-07 MED ORDER — LOSARTAN POTASSIUM 50 MG PO TABS: 50.0000 mg | ORAL_TABLET | Freq: Every day | ORAL | 5 refills | Status: DC

## 2016-04-07 NOTE — Telephone Encounter (Signed)
I spoke with pt, sent new script e-scribe to [facility] and updated medication list.

## 2016-04-07 NOTE — Telephone Encounter (Signed)
Stop the Losartan HCT. Instead call in Losartan 50 mg to take daily, #30 with 5 rf.

## 2016-07-07 DIAGNOSIS — H5213 Myopia, bilateral: Secondary | ICD-10-CM | POA: Diagnosis not present

## 2016-07-08 DIAGNOSIS — M6281 Muscle weakness (generalized): Secondary | ICD-10-CM | POA: Diagnosis not present

## 2016-07-08 DIAGNOSIS — M62838 Other muscle spasm: Secondary | ICD-10-CM | POA: Diagnosis not present

## 2016-07-09 ENCOUNTER — Ambulatory Visit (INDEPENDENT_AMBULATORY_CARE_PROVIDER_SITE_OTHER): Payer: 59 | Admitting: Adult Health

## 2016-07-09 VITALS — BP 142/80 | Temp 98.4°F | Ht 67.0 in | Wt 217.8 lb

## 2016-07-09 DIAGNOSIS — B9789 Other viral agents as the cause of diseases classified elsewhere: Secondary | ICD-10-CM

## 2016-07-09 DIAGNOSIS — J029 Acute pharyngitis, unspecified: Secondary | ICD-10-CM

## 2016-07-09 DIAGNOSIS — J028 Acute pharyngitis due to other specified organisms: Principal | ICD-10-CM

## 2016-07-09 MED ORDER — METHYLPREDNISOLONE 4 MG PO TBPK: ORAL_TABLET | ORAL | 0 refills | Status: DC

## 2016-07-09 MED ORDER — MAGIC MOUTHWASH W/LIDOCAINE: 5.0000 mL | Freq: Three times a day (TID) | ORAL | 0 refills | Status: DC | PRN

## 2016-07-09 NOTE — Progress Notes (Signed)
Subjective:    Patient ID: Marcus Pruitt, male    DOB: 13-Sep-1970, 46 y.o.   MRN: [id number]  Sore Throat   This is a new problem. The current episode started in the past 7 days. The problem has been unchanged. Neither side of throat is experiencing more pain than the other. There has been no fever. Associated symptoms include coughing, a hoarse voice and trouble swallowing. Pertinent negatives include no congestion, ear discharge, ear pain, headaches, shortness of breath, stridor or swollen glands. He has had no exposure to strep or mono. He has tried acetaminophen for the symptoms. The treatment provided mild relief.    Review of Systems  HENT: Positive for hoarse voice and trouble swallowing. Negative for congestion, ear discharge, ear pain, facial swelling, postnasal drip, rhinorrhea, sinus pain, sinus pressure and voice change.   Respiratory: Positive for cough. Negative for shortness of breath and stridor.   Cardiovascular: Negative.   Neurological: Negative for headaches.     Past Medical History:  Diagnosis Date  . Allergy   . Cancer ([place])   . Hearing loss in right ear   . Hypertension   . Knee pain   . Low back pain   . Sleep apnea     Social History   Social History  . Marital status: Married    Spouse name: N/A  . Number of children: N/A  . Years of education: N/A   Occupational History  . Not on file.   Social History Main Topics  . Smoking status: Former Smoker    Quit date: 01/28/2007  . Smokeless tobacco: Never Used  . Alcohol use 0.0 oz/week     Comment: 2 glasses liquor week  . Drug use: No  . Sexual activity: Not on file   Other Topics Concern  . Not on file   Social History Narrative  . No narrative on file    Past Surgical History:  Procedure Laterality Date  . INGUINAL HERNIA REPAIR Left 02/21/2016   Procedure: LAPAROSCOPIC INGUINAL HERNIA;  Surgeon: Alexis Frock, MD;  Location: [facility];  Service: Urology;  Laterality: Left;  .  LYMPHADENECTOMY Bilateral 02/21/2016   Procedure: PELVIC LYMPHADENECTOMY;  Surgeon: Alexis Frock, MD;  Location: [facility];  Service: Urology;  Laterality: Bilateral;  . MANDIBLE SURGERY  1990  . ROBOT ASSISTED LAPAROSCOPIC RADICAL PROSTATECTOMY N/A 02/21/2016   Procedure: XI ROBOTIC ASSISTED LAPAROSCOPIC RADICAL PROSTATECTOMY;  Surgeon: Alexis Frock, MD;  Location: [facility];  Service: Urology;  Laterality: N/A;  . UMBILICAL HERNIA REPAIR  02/21/2016   Procedure: LAPAROSCOPIC UMBILICAL HERNIA;  Surgeon: Alexis Frock, MD;  Location: [facility];  Service: Urology;;    Family History  Problem Relation Age of Onset  . Heart disease Maternal Grandmother   . Diabetes      fhx  . Hypertension      fhx  . Cancer      fhx/prostate  . Prostate cancer Father     No Known Allergies  Current Outpatient Prescriptions on File Prior to Visit  Medication Sig Dispense Refill  . losartan (COZAAR) 50 MG tablet Take 1 tablet (50 mg total) by mouth daily. 30 tablet 5  . meloxicam (MOBIC) 7.5 MG tablet Take 7.5 mg by mouth daily.     No current facility-administered medications on file prior to visit.     BP (!) 142/80   Temp 98.4 F (36.9 C) (Oral)   Ht 5\' 7"  (1.702 m)   Wt 217 lb 12.8 oz (98.8  kg)   BMI 34.11 kg/m       Objective:   Physical Exam  Constitutional: He is oriented to person, place, and time. He appears well-developed and well-nourished. No distress.  HENT:  Head: Normocephalic and atraumatic.  Right Ear: Hearing, tympanic membrane, external ear and ear canal normal.  Left Ear: Hearing, tympanic membrane, external ear and ear canal normal.  Nose: Nose normal. No mucosal edema or rhinorrhea. Right sinus exhibits no maxillary sinus tenderness and no frontal sinus tenderness. Left sinus exhibits no maxillary sinus tenderness and no frontal sinus tenderness.  Mouth/Throat: Uvula is midline and mucous membranes are normal. Posterior oropharyngeal erythema present. No oropharyngeal  exudate, posterior oropharyngeal edema or tonsillar abscesses.  Eyes: Conjunctivae and EOM are normal. Pupils are equal, round, and reactive to light. Right eye exhibits no discharge. Left eye exhibits no discharge.  Cardiovascular: Normal rate, regular rhythm, normal heart sounds and intact distal pulses.  Exam reveals no gallop and no friction rub.   No murmur heard. Pulmonary/Chest: Effort normal and breath sounds normal. No respiratory distress. He has no wheezes. He has no rales. He exhibits no tenderness.  Neurological: He is alert and oriented to person, place, and time.  Skin: Skin is warm and dry. No rash noted. He is not diaphoretic. No erythema. No pallor.  Psychiatric: He has a normal mood and affect. His behavior is normal. Judgment and thought content normal.  Nursing note and vitals reviewed.     Assessment & Plan:  1. Sore throat (viral) - No signs of strep. Will treat for symptoms relief.  - magic mouthwash w/lidocaine SOLN; Take 5 mLs by mouth 3 (three) times daily as needed.  Dispense: 180 mL; Refill: 0 - methylPREDNISolone (MEDROL DOSEPAK) 4 MG TBPK tablet; Take as directed  Dispense: 21 tablet; Refill: 0  Dorothyann Peng, NP

## 2016-07-10 ENCOUNTER — Encounter: Payer: Self-pay | Admitting: Adult Health

## 2016-07-22 DIAGNOSIS — R102 Pelvic and perineal pain: Secondary | ICD-10-CM | POA: Diagnosis not present

## 2016-07-22 DIAGNOSIS — M62838 Other muscle spasm: Secondary | ICD-10-CM | POA: Diagnosis not present

## 2016-07-22 DIAGNOSIS — M6281 Muscle weakness (generalized): Secondary | ICD-10-CM | POA: Diagnosis not present

## 2016-07-28 ENCOUNTER — Ambulatory Visit (INDEPENDENT_AMBULATORY_CARE_PROVIDER_SITE_OTHER): Payer: 59 | Admitting: Family Medicine

## 2016-07-28 ENCOUNTER — Encounter: Payer: Self-pay | Admitting: Family Medicine

## 2016-07-28 VITALS — BP 133/100 | HR 73 | Temp 98.3°F | Ht 67.0 in | Wt 219.0 lb

## 2016-07-28 DIAGNOSIS — I10 Essential (primary) hypertension: Secondary | ICD-10-CM

## 2016-07-28 MED ORDER — LISINOPRIL-HYDROCHLOROTHIAZIDE 10-12.5 MG PO TABS: 1.0000 | ORAL_TABLET | Freq: Every day | ORAL | 3 refills | Status: DC

## 2016-07-28 NOTE — Progress Notes (Signed)
Pre visit review using our clinic review tool, if applicable. No additional management support is needed unless otherwise documented below in the visit note. 

## 2016-07-28 NOTE — Progress Notes (Signed)
   Subjective:    Patient ID: Marcus Pruitt, male    DOB: [birth year], 46 y.o.   MRN: [id number]  HPI Here to discuss HTN. He feels fine but he notices his BP has been going up a little. He has been getting readings at home of 140s over 80s. He is on Losartan but he asks if he can go back to Lisinopril HCT, which he took for awhile last year.    Review of Systems  Constitutional: Negative.   Respiratory: Negative.   Cardiovascular: Negative.   Neurological: Negative.        Objective:   Physical Exam  Constitutional: He is oriented to person, place, and time. He appears well-developed and well-nourished.  Cardiovascular: Normal rate, regular rhythm, normal heart sounds and intact distal pulses.   Pulmonary/Chest: Effort normal and breath sounds normal.  Musculoskeletal: He exhibits no edema.  Neurological: He is alert and oriented to person, place, and time.          Assessment & Plan:  HTN. We will stop the Losartan and get back on Lisinopril HCT 10-12.5 daily. Recheck one month.  Alysia Penna, MD

## 2016-07-30 ENCOUNTER — Telehealth: Payer: Self-pay | Admitting: Family Medicine

## 2016-07-30 NOTE — Telephone Encounter (Signed)
Pt would like to have a Tdap is the pt eligible for one?

## 2016-08-03 NOTE — Telephone Encounter (Signed)
Pt should have TDAP, can you schedule for this?

## 2016-08-03 NOTE — Telephone Encounter (Signed)
Pt scheduled  

## 2016-08-05 ENCOUNTER — Ambulatory Visit (INDEPENDENT_AMBULATORY_CARE_PROVIDER_SITE_OTHER): Payer: 59

## 2016-08-05 DIAGNOSIS — Z23 Encounter for immunization: Secondary | ICD-10-CM

## 2016-08-05 NOTE — Progress Notes (Signed)
Patient received hie TDAP vaccine today 08/05/16 at 9.10am. Given by Rosealee Albee CMA.

## 2016-08-17 DIAGNOSIS — M6281 Muscle weakness (generalized): Secondary | ICD-10-CM | POA: Diagnosis not present

## 2016-08-17 DIAGNOSIS — M62838 Other muscle spasm: Secondary | ICD-10-CM | POA: Diagnosis not present

## 2016-08-18 DIAGNOSIS — C61 Malignant neoplasm of prostate: Secondary | ICD-10-CM | POA: Diagnosis not present

## 2016-08-31 DIAGNOSIS — R102 Pelvic and perineal pain: Secondary | ICD-10-CM | POA: Diagnosis not present

## 2016-08-31 DIAGNOSIS — C61 Malignant neoplasm of prostate: Secondary | ICD-10-CM | POA: Diagnosis not present

## 2016-08-31 LAB — PSA

## 2016-09-28 ENCOUNTER — Encounter: Payer: Self-pay | Admitting: Family Medicine

## 2016-09-28 ENCOUNTER — Ambulatory Visit (INDEPENDENT_AMBULATORY_CARE_PROVIDER_SITE_OTHER): Payer: 59 | Admitting: Family Medicine

## 2016-09-28 VITALS — BP 159/99 | HR 99 | Temp 101.8°F | Ht 67.0 in | Wt 217.0 lb

## 2016-09-28 DIAGNOSIS — J209 Acute bronchitis, unspecified: Secondary | ICD-10-CM | POA: Diagnosis not present

## 2016-09-28 MED ORDER — AZITHROMYCIN 250 MG PO TABS: ORAL_TABLET | ORAL | 0 refills | Status: DC

## 2016-09-28 NOTE — Progress Notes (Signed)
Pre visit review using our clinic review tool, if applicable. No additional management support is needed unless otherwise documented below in the visit note. 

## 2016-09-28 NOTE — Patient Instructions (Signed)
WE NOW OFFER   [facility] FAST TRACK!!!  SAME DAY Appointments for ACUTE CARE  Such as: Sprains, Injuries, cuts, abrasions, rashes, muscle pain, joint pain, back pain Colds, flu, sore throats, headache, allergies, cough, fever  Ear pain, sinus and eye infections Abdominal pain, nausea, vomiting, diarrhea, upset stomach Animal/insect bites  3 Easy Ways to Schedule: Walk-In Scheduling Call in scheduling Mychart Sign-up: [email or web address]         

## 2016-09-28 NOTE — Progress Notes (Signed)
   Subjective:    Patient ID: Marcus Pruitt, male    DOB: 1971-04-24, 46 y.o.   MRN: [id number]  HPI Here for 4 days of fever, chest congestion, and coughing up green sputum.    Review of Systems  Constitutional: Positive for fever.  HENT: Positive for congestion, postnasal drip and sore throat. Negative for sinus pain and sinus pressure.   Eyes: Negative.   Respiratory: Positive for cough and chest tightness.        Objective:   Physical Exam  Constitutional: He appears well-developed and well-nourished.  HENT:  Right Ear: External ear normal.  Left Ear: External ear normal.  Nose: Nose normal.  Mouth/Throat: Oropharynx is clear and moist.  Eyes: Conjunctivae are normal.  Neck: No thyromegaly present.  Pulmonary/Chest: Effort normal. No respiratory distress. He has no wheezes. He has no rales.  Scattered rhonchi   Lymphadenopathy:    He has no cervical adenopathy.          Assessment & Plan:  Bronchitis, treat with a Zpack and Mucinex.  Alysia Penna, MD

## 2016-10-01 ENCOUNTER — Other Ambulatory Visit: Payer: Self-pay | Admitting: Adult Health

## 2016-10-01 ENCOUNTER — Telehealth: Payer: Self-pay | Admitting: Family Medicine

## 2016-10-01 MED ORDER — BENZONATATE 100 MG PO CAPS: 100.0000 mg | ORAL_CAPSULE | Freq: Two times a day (BID) | ORAL | 0 refills | Status: DC | PRN

## 2016-10-01 NOTE — Telephone Encounter (Signed)
I called the pt and informed him of the message below. 

## 2016-10-01 NOTE — Telephone Encounter (Signed)
Pt was seen on 4-2 for bronchitis. Pt has cough and would like something sent to [facility]. Pt is aware md out of office until monday

## 2016-10-01 NOTE — Telephone Encounter (Signed)
Tessalon pearls sent to pharmacy. He can also take Mucinex

## 2016-10-09 ENCOUNTER — Encounter: Payer: Self-pay | Admitting: Family Medicine

## 2016-10-09 ENCOUNTER — Ambulatory Visit (INDEPENDENT_AMBULATORY_CARE_PROVIDER_SITE_OTHER): Payer: 59 | Admitting: Family Medicine

## 2016-10-09 VITALS — BP 130/92 | Temp 98.2°F | Ht 67.0 in | Wt 218.0 lb

## 2016-10-09 DIAGNOSIS — J209 Acute bronchitis, unspecified: Secondary | ICD-10-CM

## 2016-10-09 NOTE — Progress Notes (Signed)
   Subjective:    Patient ID: Tedrick Port, male    DOB: 1970-12-13, 46 y.o.   MRN: [id number]  HPI Here to follow up a bronchitis. He was seen on 09-28-16 and he took a Zpack. He feels better now but he he still has a dry lingering cough. Taking Benzonatate and Mucinex.    Review of Systems  Constitutional: Negative.   HENT: Negative.   Eyes: Negative.   Respiratory: Positive for cough. Negative for choking, chest tightness, shortness of breath and wheezing.   Cardiovascular: Negative.        Objective:   Physical Exam  Constitutional: He appears well-developed and well-nourished.  HENT:  Right Ear: External ear normal.  Left Ear: External ear normal.  Nose: Nose normal.  Mouth/Throat: Oropharynx is clear and moist.  Eyes: Conjunctivae are normal.  Neck: No thyromegaly present.  Pulmonary/Chest: Effort normal and breath sounds normal. No respiratory distress. He has no wheezes. He has no rales. He exhibits no tenderness.  Lymphadenopathy:    He has no cervical adenopathy.          Assessment & Plan:  Resolving bronchitis. He is back to work now. We filled out FMLA forms to excuse him from work from 09-27-16 through 10-04-16.  Alysia Penna, MD

## 2016-10-09 NOTE — Progress Notes (Signed)
Pre visit review using our clinic review tool, if applicable. No additional management support is needed unless otherwise documented below in the visit note. 

## 2016-10-09 NOTE — Patient Instructions (Signed)
WE NOW OFFER   [facility] FAST TRACK!!!  SAME DAY Appointments for ACUTE CARE  Such as: Sprains, Injuries, cuts, abrasions, rashes, muscle pain, joint pain, back pain Colds, flu, sore throats, headache, allergies, cough, fever  Ear pain, sinus and eye infections Abdominal pain, nausea, vomiting, diarrhea, upset stomach Animal/insect bites  3 Easy Ways to Schedule: Walk-In Scheduling Call in scheduling Mychart Sign-up: [email or web address]         

## 2016-11-03 DIAGNOSIS — C61 Malignant neoplasm of prostate: Secondary | ICD-10-CM | POA: Diagnosis not present

## 2016-11-10 ENCOUNTER — Encounter: Payer: Self-pay | Admitting: Family Medicine

## 2016-12-01 DIAGNOSIS — R300 Dysuria: Secondary | ICD-10-CM | POA: Diagnosis not present

## 2016-12-01 DIAGNOSIS — R3913 Splitting of urinary stream: Secondary | ICD-10-CM | POA: Diagnosis not present

## 2016-12-21 ENCOUNTER — Encounter: Payer: Self-pay | Admitting: Family Medicine

## 2016-12-21 ENCOUNTER — Ambulatory Visit (INDEPENDENT_AMBULATORY_CARE_PROVIDER_SITE_OTHER): Payer: 59 | Admitting: Family Medicine

## 2016-12-21 VITALS — BP 122/94 | HR 72 | Temp 98.2°F | Wt 215.0 lb

## 2016-12-21 DIAGNOSIS — I10 Essential (primary) hypertension: Secondary | ICD-10-CM | POA: Diagnosis not present

## 2016-12-21 DIAGNOSIS — M545 Low back pain, unspecified: Secondary | ICD-10-CM

## 2016-12-21 MED ORDER — PREDNISONE 10 MG PO TABS: ORAL_TABLET | ORAL | 0 refills | Status: DC

## 2016-12-21 MED ORDER — CYCLOBENZAPRINE HCL 5 MG PO TABS: 5.0000 mg | ORAL_TABLET | Freq: Three times a day (TID) | ORAL | 0 refills | Status: DC | PRN

## 2016-12-21 NOTE — Patient Instructions (Signed)
It was a pleasure to see you today. Please take medication as directed and follow up if symptoms do not improve with treatment, worsen, or you develop new symptoms.   Back Pain, Adult Back pain is very common. The pain often gets better over time. The cause of back pain is usually not dangerous. Most people can learn to manage their back pain on their own. Follow these instructions at home: Watch your back pain for any changes. The following actions may help to lessen any pain you are feeling:  Stay active. Start with short walks on flat ground if you can. Try to walk farther each day.  Exercise regularly as told by your doctor. Exercise helps your back heal faster. It also helps avoid future injury by keeping your muscles strong and flexible.  Do not sit, drive, or stand in one place for more than 30 minutes.  Do not stay in bed. Resting more than 1-2 days can slow down your recovery.  Be careful when you bend or lift an object. Use good form when lifting: ? Bend at your knees. ? Keep the object close to your body. ? Do not twist.  Sleep on a firm mattress. Lie on your side, and bend your knees. If you lie on your back, put a pillow under your knees.  Take medicines only as told by your doctor.  Put ice on the injured area. ? Put ice in a plastic bag. ? Place a towel between your skin and the bag. ? Leave the ice on for 20 minutes, 2-3 times a day for the first 2-3 days. After that, you can switch between ice and heat packs.  Avoid feeling anxious or stressed. Find good ways to deal with stress, such as exercise.  Maintain a healthy weight. Extra weight puts stress on your back.  Contact a doctor if:  You have pain that does not go away with rest or medicine.  You have worsening pain that goes down into your legs or buttocks.  You have pain that does not get better in one week.  You have pain at night.  You lose weight.  You have a fever or chills. Get help right away  if:  You cannot control when you poop (bowel movement) or pee (urinate).  Your arms or legs feel weak.  Your arms or legs lose feeling (numbness).  You feel sick to your stomach (nauseous) or throw up (vomit).  You have belly (abdominal) pain.  You feel like you may pass out (faint). This information is not intended to replace advice given to you by your health care provider. Make sure you discuss any questions you have with your health care provider. Document Released: 12/02/2007 Document Revised: 11/21/2015 Document Reviewed: 10/17/2013 Elsevier Interactive Patient Education  [employer].

## 2016-12-21 NOTE — Progress Notes (Signed)
Subjective:    Patient ID: Marcus Pruitt, male    DOB: 08/19/1970, 46 y.o.   MRN: [id number]  HPI  Marcus Pruitt is a 46 year old male who presents today with lower left back pain that started 3 days ago.  BACK PAIN  Location: Lower left back pain  Quality: Aching Onset: 3 days ago     Rated between a 6 to 3 and described aching Worse with: getting up/walking    Better with: lying down       Radiation: No Trauma: No Best sitting/standing/leaning forward:  No  Red Flags Fecal/urinary incontinence: No Numbness/Weakness: No Fever/chills/sweats: No Night pain:  No Unexplained weight loss: No No relief with bedrest:  No h/o cancer/immunosuppression:  Prostate cancer with surgery. No chemo or radiation IV drug use: No  PMH of osteoporosis or chronic steroid use: No Treatment with heat and extra strength tylenol has provided limited benefit.  History of pes planus and chronic back pain.  Elevated BP noted; patient endorses forgetting his medication today; he denies chest pain, palpitations, SOB, numbness, tingling, weakness, headaches, or edema.    Review of Systems  Constitutional: Negative for chills, fatigue and fever.  Respiratory: Negative for cough, shortness of breath and wheezing.   Cardiovascular: Negative for chest pain and palpitations.  Gastrointestinal: Negative for abdominal pain, constipation, diarrhea, nausea and vomiting.  Genitourinary: Negative for dysuria, frequency, hematuria and urgency.  Musculoskeletal: Positive for back pain.  Skin: Negative for rash.  Neurological: Negative for dizziness, weakness and numbness.   Past Medical History:  Diagnosis Date  . Allergy   . Cancer ([place])   . Hearing loss in right ear   . Hypertension   . Knee pain   . Low back pain   . Sleep apnea      Social History   Social History  . Marital status: Married    Spouse name: N/A  . Number of children: N/A  . Years of education: N/A   Occupational History  . Not on file.    Social History Main Topics  . Smoking status: Former Smoker    Quit date: 01/28/2007  . Smokeless tobacco: Never Used  . Alcohol use 0.0 oz/week     Comment: 2 glasses liquor week  . Drug use: No  . Sexual activity: Not on file   Other Topics Concern  . Not on file   Social History Narrative  . No narrative on file    Past Surgical History:  Procedure Laterality Date  . INGUINAL HERNIA REPAIR Left 02/21/2016   Procedure: LAPAROSCOPIC INGUINAL HERNIA;  Surgeon: Alexis Frock, MD;  Location: [facility] ORS;  Service: Urology;  Laterality: Left;  . LYMPHADENECTOMY Bilateral 02/21/2016   Procedure: PELVIC LYMPHADENECTOMY;  Surgeon: Alexis Frock, MD;  Location: [facility];  Service: Urology;  Laterality: Bilateral;  . MANDIBLE SURGERY  1990  . ROBOT ASSISTED LAPAROSCOPIC RADICAL PROSTATECTOMY N/A 02/21/2016   Procedure: XI ROBOTIC ASSISTED LAPAROSCOPIC RADICAL PROSTATECTOMY;  Surgeon: Alexis Frock, MD;  Location: [facility];  Service: Urology;  Laterality: N/A;  . UMBILICAL HERNIA REPAIR  02/21/2016   Procedure: LAPAROSCOPIC UMBILICAL HERNIA;  Surgeon: Alexis Frock, MD;  Location: [facility];  Service: Urology;;    Family History  Problem Relation Age of Onset  . Heart disease Maternal Grandmother   . Diabetes Unknown        fhx  . Hypertension Unknown        fhx  . Cancer Unknown  fhx/prostate  . Prostate cancer Father     No Known Allergies  Current Outpatient Prescriptions on File Prior to Visit  Medication Sig Dispense Refill  . benzonatate (TESSALON) 100 MG capsule Take 1 capsule (100 mg total) by mouth 2 (two) times daily as needed for cough. 20 capsule 0  . lisinopril-hydrochlorothiazide (PRINZIDE,ZESTORETIC) 10-12.5 MG tablet Take 1 tablet by mouth daily. 90 tablet 3   No current facility-administered medications on file prior to visit.     BP (!) 122/94   Pulse 72   Temp 98.2 F (36.8 C) (Oral)   Wt 215 lb (97.5 kg)   SpO2 98%   BMI 33.67 kg/m          Objective:   Physical Exam  Constitutional: He is oriented to person, place, and time. He appears well-developed and well-nourished.  Eyes: Pupils are equal, round, and reactive to light. No scleral icterus.  Neck: Neck supple.  Cardiovascular: Normal rate, regular rhythm and intact distal pulses.   Pulmonary/Chest: Effort normal and breath sounds normal. He has no wheezes. He has no rales.  Abdominal: Soft. Bowel sounds are normal. There is no tenderness. There is no rebound.  Musculoskeletal: He exhibits no edema.  Spine with normal alignment and no deformity. No tenderness to vertebral process with palpation with the exception of mild tenderness around L5.  Paraspinous muscles are tender and patient notes this area as painful and gripping when walking. ROM is full at lumbar sacral regions. Negative Straight Leg raise. No CVA tenderness present. Able to heel/toe walk without pain.  Lymphadenopathy:    He has no cervical adenopathy.  Neurological: He is alert and oriented to person, place, and time. Coordination normal.  Skin: Skin is warm and dry. No rash noted.  Psychiatric: He has a normal mood and affect. His behavior is normal. Judgment and thought content normal.       Assessment & Plan:  1. Acute left-sided low back pain without sciatica Exam is reassuring; most likely etiology is related to chronic back pain that may have been triggered with working out in gym. Will provide prednisone and cyclobenzaprine; advised follow up with PCP if symptoms do not improve with treatment, worsen, or he develops new symptoms such as fever. - predniSONE (DELTASONE) 10 MG tablet; Take 4 tablets once daily for 2 days, 3 tabs daily for 2 days, 2 tabs daily for 2 days, 1 tab daily for 2 days.  Dispense: 200 tablet; Refill: 0 - cyclobenzaprine (FLEXERIL) 5 MG tablet; Take 1 tablet (5 mg total) by mouth 3 (three) times daily as needed for muscle spasms.  Dispense: 30 tablet; Refill: 0   2. Essential  hypertension Retake of BP is 122/94; patient has not taken medication today and stated he will do so when he returns home. Advised him to take medication and follow up with PCP as recommended.  Delano Metz, FNP-C

## 2017-01-22 DIAGNOSIS — R300 Dysuria: Secondary | ICD-10-CM | POA: Diagnosis not present

## 2017-01-22 DIAGNOSIS — C61 Malignant neoplasm of prostate: Secondary | ICD-10-CM | POA: Diagnosis not present

## 2017-03-18 ENCOUNTER — Encounter: Payer: Self-pay | Admitting: Family Medicine

## 2017-04-29 DIAGNOSIS — K55069 Acute infarction of intestine, part and extent unspecified: Secondary | ICD-10-CM

## 2017-04-29 HISTORY — DX: Acute infarction of intestine, part and extent unspecified: K55.069

## 2017-05-14 ENCOUNTER — Encounter (HOSPITAL_COMMUNITY): Payer: Self-pay | Admitting: Emergency Medicine

## 2017-05-14 ENCOUNTER — Other Ambulatory Visit: Payer: Self-pay

## 2017-05-14 ENCOUNTER — Emergency Department (HOSPITAL_COMMUNITY): Payer: 59

## 2017-05-14 ENCOUNTER — Inpatient Hospital Stay (HOSPITAL_COMMUNITY)
Admission: EM | Admit: 2017-05-14 | Discharge: 2017-05-28 | DRG: 270 | Disposition: A | Discharge status: Home / Self Care | Payer: 59 | Attending: Vascular Surgery | Admitting: Vascular Surgery

## 2017-05-14 DIAGNOSIS — R578 Other shock: Secondary | ICD-10-CM | POA: Diagnosis not present

## 2017-05-14 DIAGNOSIS — G4733 Obstructive sleep apnea (adult) (pediatric): Secondary | ICD-10-CM | POA: Diagnosis present

## 2017-05-14 DIAGNOSIS — Z9079 Acquired absence of other genital organ(s): Secondary | ICD-10-CM

## 2017-05-14 DIAGNOSIS — Z79899 Other long term (current) drug therapy: Secondary | ICD-10-CM

## 2017-05-14 DIAGNOSIS — N200 Calculus of kidney: Secondary | ICD-10-CM | POA: Diagnosis not present

## 2017-05-14 DIAGNOSIS — I7779 Dissection of other artery: Secondary | ICD-10-CM | POA: Diagnosis not present

## 2017-05-14 DIAGNOSIS — K660 Peritoneal adhesions (postprocedural) (postinfection): Secondary | ICD-10-CM | POA: Diagnosis present

## 2017-05-14 DIAGNOSIS — R079 Chest pain, unspecified: Secondary | ICD-10-CM | POA: Diagnosis not present

## 2017-05-14 DIAGNOSIS — R110 Nausea: Secondary | ICD-10-CM | POA: Diagnosis not present

## 2017-05-14 DIAGNOSIS — Z87891 Personal history of nicotine dependence: Secondary | ICD-10-CM

## 2017-05-14 DIAGNOSIS — T404X5A Adverse effect of other synthetic narcotics, initial encounter: Secondary | ICD-10-CM | POA: Diagnosis not present

## 2017-05-14 DIAGNOSIS — J9601 Acute respiratory failure with hypoxia: Secondary | ICD-10-CM | POA: Diagnosis not present

## 2017-05-14 DIAGNOSIS — K567 Ileus, unspecified: Secondary | ICD-10-CM | POA: Diagnosis not present

## 2017-05-14 DIAGNOSIS — Z8546 Personal history of malignant neoplasm of prostate: Secondary | ICD-10-CM

## 2017-05-14 DIAGNOSIS — E876 Hypokalemia: Secondary | ICD-10-CM | POA: Diagnosis not present

## 2017-05-14 DIAGNOSIS — H9191 Unspecified hearing loss, right ear: Secondary | ICD-10-CM | POA: Diagnosis present

## 2017-05-14 DIAGNOSIS — Z0189 Encounter for other specified special examinations: Secondary | ICD-10-CM

## 2017-05-14 DIAGNOSIS — K559 Vascular disorder of intestine, unspecified: Secondary | ICD-10-CM | POA: Diagnosis present

## 2017-05-14 DIAGNOSIS — Z9889 Other specified postprocedural states: Secondary | ICD-10-CM

## 2017-05-14 DIAGNOSIS — R140 Abdominal distension (gaseous): Secondary | ICD-10-CM

## 2017-05-14 DIAGNOSIS — N179 Acute kidney failure, unspecified: Secondary | ICD-10-CM | POA: Diagnosis not present

## 2017-05-14 DIAGNOSIS — Z9289 Personal history of other medical treatment: Secondary | ICD-10-CM

## 2017-05-14 DIAGNOSIS — K55069 Acute infarction of intestine, part and extent unspecified: Secondary | ICD-10-CM | POA: Diagnosis present

## 2017-05-14 DIAGNOSIS — S301XXA Contusion of abdominal wall, initial encounter: Secondary | ICD-10-CM | POA: Diagnosis present

## 2017-05-14 DIAGNOSIS — I952 Hypotension due to drugs: Secondary | ICD-10-CM | POA: Diagnosis not present

## 2017-05-14 DIAGNOSIS — J9690 Respiratory failure, unspecified, unspecified whether with hypoxia or hypercapnia: Secondary | ICD-10-CM

## 2017-05-14 DIAGNOSIS — Z978 Presence of other specified devices: Secondary | ICD-10-CM

## 2017-05-14 DIAGNOSIS — Y92230 Patient room in hospital as the place of occurrence of the external cause: Secondary | ICD-10-CM | POA: Diagnosis not present

## 2017-05-14 DIAGNOSIS — R7989 Other specified abnormal findings of blood chemistry: Secondary | ICD-10-CM | POA: Diagnosis not present

## 2017-05-14 DIAGNOSIS — J9811 Atelectasis: Secondary | ICD-10-CM | POA: Diagnosis not present

## 2017-05-14 DIAGNOSIS — R9431 Abnormal electrocardiogram [ECG] [EKG]: Secondary | ICD-10-CM | POA: Diagnosis not present

## 2017-05-14 DIAGNOSIS — K55059 Acute (reversible) ischemia of intestine, part and extent unspecified: Secondary | ICD-10-CM | POA: Diagnosis present

## 2017-05-14 DIAGNOSIS — I10 Essential (primary) hypertension: Secondary | ICD-10-CM | POA: Diagnosis present

## 2017-05-14 DIAGNOSIS — E119 Type 2 diabetes mellitus without complications: Secondary | ICD-10-CM | POA: Diagnosis present

## 2017-05-14 DIAGNOSIS — T41295A Adverse effect of other general anesthetics, initial encounter: Secondary | ICD-10-CM | POA: Diagnosis not present

## 2017-05-14 DIAGNOSIS — R197 Diarrhea, unspecified: Secondary | ICD-10-CM | POA: Diagnosis not present

## 2017-05-14 DIAGNOSIS — D62 Acute posthemorrhagic anemia: Secondary | ICD-10-CM | POA: Diagnosis not present

## 2017-05-14 DIAGNOSIS — I739 Peripheral vascular disease, unspecified: Secondary | ICD-10-CM | POA: Diagnosis present

## 2017-05-14 DIAGNOSIS — Y838 Other surgical procedures as the cause of abnormal reaction of the patient, or of later complication, without mention of misadventure at the time of the procedure: Secondary | ICD-10-CM | POA: Diagnosis present

## 2017-05-14 DIAGNOSIS — Z781 Physical restraint status: Secondary | ICD-10-CM

## 2017-05-14 DIAGNOSIS — R402423 Glasgow coma scale score 9-12, at hospital admission: Secondary | ICD-10-CM | POA: Diagnosis present

## 2017-05-14 DIAGNOSIS — T8130XA Disruption of wound, unspecified, initial encounter: Secondary | ICD-10-CM | POA: Diagnosis present

## 2017-05-14 DIAGNOSIS — R748 Abnormal levels of other serum enzymes: Secondary | ICD-10-CM

## 2017-05-14 DIAGNOSIS — K91870 Postprocedural hematoma of a digestive system organ or structure following a digestive system procedure: Secondary | ICD-10-CM | POA: Diagnosis present

## 2017-05-14 DIAGNOSIS — K2970 Gastritis, unspecified, without bleeding: Secondary | ICD-10-CM | POA: Diagnosis not present

## 2017-05-14 DIAGNOSIS — G9349 Other encephalopathy: Secondary | ICD-10-CM | POA: Diagnosis not present

## 2017-05-14 DIAGNOSIS — D72829 Elevated white blood cell count, unspecified: Secondary | ICD-10-CM | POA: Diagnosis not present

## 2017-05-14 DIAGNOSIS — I7770 Dissection of unspecified artery: Secondary | ICD-10-CM | POA: Diagnosis present

## 2017-05-14 LAB — CBC
HCT: 46.7 % (ref 39.0–52.0)
Hemoglobin: 15.8 g/dL (ref 13.0–17.0)
MCH: 28.1 pg (ref 26.0–34.0)
MCHC: 33.8 g/dL (ref 30.0–36.0)
MCV: 83.1 fL (ref 78.0–100.0)
PLATELETS: 197 10*3/uL (ref 150–400)
RBC: 5.62 MIL/uL (ref 4.22–5.81)
RDW: 14.6 % (ref 11.5–15.5)
WBC: 5.8 10*3/uL (ref 4.0–10.5)

## 2017-05-14 LAB — COMPREHENSIVE METABOLIC PANEL
ALK PHOS: 47 U/L (ref 38–126)
ALT: 34 U/L (ref 17–63)
AST: 41 U/L (ref 15–41)
Albumin: 4.4 g/dL (ref 3.5–5.0)
Anion gap: 13 (ref 5–15)
BILIRUBIN TOTAL: 0.9 mg/dL (ref 0.3–1.2)
BUN: 12 mg/dL (ref 6–20)
CALCIUM: 9.8 mg/dL (ref 8.9–10.3)
CHLORIDE: 99 mmol/L — AB (ref 101–111)
CO2: 24 mmol/L (ref 22–32)
CREATININE: 1.19 mg/dL (ref 0.61–1.24)
GFR calc Af Amer: >60 mL/min (ref >60)
Glucose, Bld: 105 mg/dL — ABNORMAL HIGH (ref 65–99)
Potassium: 2.9 mmol/L — ABNORMAL LOW (ref 3.5–5.1)
Sodium: 136 mmol/L (ref 135–145)
Total Protein: 8.3 g/dL — ABNORMAL HIGH (ref 6.5–8.1)

## 2017-05-14 LAB — URINALYSIS, ROUTINE W REFLEX MICROSCOPIC
Bilirubin Urine: NEGATIVE
GLUCOSE, UA: NEGATIVE mg/dL
HGB URINE DIPSTICK: NEGATIVE
KETONES UR: NEGATIVE mg/dL
LEUKOCYTES UA: NEGATIVE
Nitrite: NEGATIVE
PROTEIN: NEGATIVE mg/dL
Specific Gravity, Urine: 1.009 (ref 1.005–1.030)
pH: 7 (ref 5.0–8.0)

## 2017-05-14 LAB — LIPASE, BLOOD: LIPASE: 27 U/L (ref 11–51)

## 2017-05-14 MED ORDER — FENTANYL CITRATE (PF) 100 MCG/2ML IJ SOLN
50.0000 ug | INTRAMUSCULAR | Status: DC | PRN
  Administered 2017-05-14: 50 ug via INTRAVENOUS
  Filled 2017-05-14: qty 2

## 2017-05-14 MED ORDER — GI COCKTAIL ~~LOC~~
30.0000 mL | Freq: Once | ORAL | Status: AC
  Administered 2017-05-14: 30 mL via ORAL
  Filled 2017-05-14: qty 30

## 2017-05-14 MED ORDER — IOPAMIDOL (ISOVUE-300) INJECTION 61%
INTRAVENOUS | Status: AC
  Filled 2017-05-14: qty 100

## 2017-05-14 MED ORDER — IOPAMIDOL (ISOVUE-300) INJECTION 61%
100.0000 mL | Freq: Once | INTRAVENOUS | Status: AC | PRN
  Administered 2017-05-14: 100 mL via INTRAVENOUS

## 2017-05-14 MED ORDER — HYDROMORPHONE HCL 1 MG/ML IJ SOLN
1.0000 mg | Freq: Once | INTRAMUSCULAR | Status: AC
  Administered 2017-05-14: 1 mg via INTRAVENOUS
  Filled 2017-05-14: qty 1

## 2017-05-14 MED ORDER — ONDANSETRON HCL 4 MG/2ML IJ SOLN
4.0000 mg | Freq: Once | INTRAMUSCULAR | Status: AC | PRN
  Administered 2017-05-14: 4 mg via INTRAVENOUS
  Filled 2017-05-14: qty 2

## 2017-05-14 MED ORDER — HYDROMORPHONE HCL 1 MG/ML IJ SOLN
1.0000 mg | Freq: Once | INTRAMUSCULAR | Status: AC
  Administered 2017-05-15: 1 mg via INTRAVENOUS
  Filled 2017-05-14: qty 1

## 2017-05-14 NOTE — ED Triage Notes (Signed)
Pt brought in by EMS from work with c/o epigastric pain that "is now spreading down to lower side".  Pt also c/o nausea, no vomiting or diarrhea.  Pt has hx of prostate cancer.

## 2017-05-14 NOTE — ED Provider Notes (Signed)
[facility] DEPT Provider Note   CSN: [id number] Arrival date & time: 05/14/17  2104     History   Chief Complaint Chief Complaint  Patient presents with  . Abdominal Pain    HPI Marcus Pruitt is a 46 y.o. male.  Patient presents to the emergency department with a chief complaint of abdominal pain.  Patient states that the pain started in his central upper abdomen, and is now more on the lower right side.  He states the symptoms started at about 8 PM tonight.  He reports some nausea, but denies any vomiting.  He reports associated subjective fevers and chills.  He denies any diarrhea or constipation.  He has not taken anything for his symptoms, but was given fentanyl in triage with some relief.  He denies any postprandial pain.  He reports that he did have a prostatectomy 2 years ago, but denies any other prior abdominal surgeries.  He states that he does not regularly use alcohol.   The history is provided by the patient. No language interpreter was used.    Past Medical History:  Diagnosis Date  . Allergy   . Cancer ([employer])   . Hearing loss in right ear   . Hypertension   . Knee pain   . Low back pain   . Sleep apnea     Patient Active Problem List   Diagnosis Date Noted  . Prostate cancer ([place]) 02/21/2016  . Blepharitis of eyelid of right eye 05/08/2014  . HTN (hypertension) 03/30/2014  . OBSTRUCTIVE SLEEP APNEA 08/04/2010  . SNORING 07/21/2010  . ERECTILE DYSFUNCTION, ORGANIC 06/13/2010  . STRESS REACTION, ACUTE, WITH EMOTIONAL DISTURBANCE 04/18/2009  . INSOMNIA 04/18/2009  . Unspecified hearing loss 03/06/2009  . ALLERGIC RHINITIS 03/06/2009  . KNEE PAIN, BILATERAL 03/06/2009  . LOW BACK PAIN 03/06/2009  . PES PLANUS 03/06/2009    Past Surgical History:  Procedure Laterality Date  . LAPAROSCOPIC INGUINAL HERNIA Left 02/21/2016   Performed by Alexis Frock, MD at [facility] ORS  . LAPAROSCOPIC UMBILICAL HERNIA  9/51/8841   Performed by  Alexis Frock, MD at [facility] ORS  . MANDIBLE SURGERY  1990  . PELVIC LYMPHADENECTOMY Bilateral 02/21/2016   Performed by Alexis Frock, MD at [facility] ORS  . XI ROBOTIC ASSISTED LAPAROSCOPIC RADICAL PROSTATECTOMY N/A 02/21/2016   Performed by Alexis Frock, MD at [facility] ORS       Home Medications    Prior to Admission medications   Medication Sig Start Date End Date Taking? Authorizing Provider  lisinopril-hydrochlorothiazide (PRINZIDE,ZESTORETIC) 10-12.5 MG tablet Take 1 tablet by mouth daily. 07/28/16  Yes Laurey Morale, MD  benzonatate (TESSALON) 100 MG capsule Take 1 capsule (100 mg total) by mouth 2 (two) times daily as needed for cough. Patient not taking: Reported on 05/14/2017 10/01/16   Dorothyann Peng, NP  cyclobenzaprine (FLEXERIL) 5 MG tablet Take 1 tablet (5 mg total) by mouth 3 (three) times daily as needed for muscle spasms. Patient not taking: Reported on 05/14/2017 12/21/16   Delano Metz, FNP  predniSONE (DELTASONE) 10 MG tablet Take 4 tablets once daily for 2 days, 3 tabs daily for 2 days, 2 tabs daily for 2 days, 1 tab daily for 2 days. Patient not taking: Reported on 05/14/2017 12/21/16   Delano Metz, FNP    Family History Family History  Problem Relation Age of Onset  . Heart disease Maternal Grandmother   . Diabetes Unknown        fhx  . Hypertension  Unknown        fhx  . Cancer Unknown        fhx/prostate  . Prostate cancer Father     Social History Social History   Tobacco Use  . Smoking status: Former Smoker    Last attempt to quit: 01/28/2007    Years since quitting: 10.2  . Smokeless tobacco: Never Used  Substance Use Topics  . Alcohol use: Yes    Alcohol/week: 0.0 oz    Comment: 2 glasses liquor week  . Drug use: No     Allergies   Patient has no known allergies.   Review of Systems Review of Systems  All other systems reviewed and are negative.    Physical Exam Updated Vital Signs BP (!) 146/119 (BP Location: Left Arm)   Pulse  80   Temp 97.9 F (36.6 C) (Oral)   Resp (!) 22   Ht 5\' 6"  (1.676 m)   Wt 98 kg (216 lb)   SpO2 100%   BMI 34.86 kg/m   Physical Exam  Constitutional: He is oriented to person, place, and time. He appears well-developed and well-nourished.  HENT:  Head: Normocephalic and atraumatic.  Eyes: Conjunctivae and EOM are normal. Pupils are equal, round, and reactive to light. Right eye exhibits no discharge. Left eye exhibits no discharge. No scleral icterus.  Neck: Normal range of motion. Neck supple. No JVD present.  Cardiovascular: Normal rate, regular rhythm and normal heart sounds. Exam reveals no gallop and no friction rub.  No murmur heard. Pulmonary/Chest: Effort normal and breath sounds normal. No respiratory distress. He has no wheezes. He has no rales. He exhibits no tenderness.  Abdominal: Soft. He exhibits no distension and no mass. There is tenderness in the right lower quadrant. There is no rebound and no guarding.  Right lower quadrant is tender to palpation No upper abdominal tenderness  Musculoskeletal: Normal range of motion. He exhibits no edema or tenderness.  Neurological: He is alert and oriented to person, place, and time.  Skin: Skin is warm and dry.  Psychiatric: He has a normal mood and affect. His behavior is normal. Judgment and thought content normal.  Nursing note and vitals reviewed.    ED Treatments / Results  Labs (all labs ordered are listed, but only abnormal results are displayed) Labs Reviewed  COMPREHENSIVE METABOLIC PANEL - Abnormal; Notable for the following components:      Result Value   Potassium 2.9 (*)    Chloride 99 (*)    Glucose, Bld 105 (*)    Total Protein 8.3 (*)    All other components within normal limits  URINALYSIS, ROUTINE W REFLEX MICROSCOPIC - Abnormal; Notable for the following components:   Color, Urine STRAW (*)    All other components within normal limits  LIPASE, BLOOD  CBC  APTT  PROTIME-INR  I-STAT CG4 LACTIC  ACID, ED    EKG  EKG Interpretation None       Radiology Dg Chest 2 View  Result Date: 05/14/2017 CLINICAL DATA:  Chest pain EXAM: CHEST  2 VIEW COMPARISON:  None. FINDINGS: The heart size and mediastinal contours are within normal limits. Both lungs are clear. The visualized skeletal structures are unremarkable. IMPRESSION: No active cardiopulmonary disease. Electronically Signed   By: Donavan Foil M.D.   On: 05/14/2017 23:20   Ct Abdomen Pelvis W Contrast  Result Date: 05/14/2017 CLINICAL DATA:  Initial evaluation for acute epigastric pain, nausea. EXAM: CT ABDOMEN AND PELVIS WITH CONTRAST  TECHNIQUE: Multidetector CT imaging of the abdomen and pelvis was performed using the standard protocol following bolus administration of intravenous contrast. CONTRAST:  110mL ISOVUE-300 IOPAMIDOL (ISOVUE-300) INJECTION 61% COMPARISON:  None available. FINDINGS: Lower chest: Mild scattered bibasilar atelectatic changes present within the visualized lungs. Visualized lungs are otherwise clear. Hepatobiliary: Subcentimeter hypodensity within the right hepatic lobe noted, too small the characterize, but of doubtful significance. Liver otherwise unremarkable. No portal venous gas. Gallbladder within normal limits. No biliary dilatation. Pancreas: Pancreas within normal limits. Spleen: Spleen within normal limits. Adrenals/Urinary Tract: Adrenal glands are normal. Kidneys equal in size with symmetric enhancement. 2.2 cm cyst present within the interpolar right kidney. Few additional subcentimeter hypodensities noted, too small the characterize, but statistically likely reflects small cysts as well. Punctate 3 mm nonobstructive stone present within the lower pole the right kidney. No hydronephrosis or focal enhancing renal mass. No hydroureter. Partially distended bladder within normal limits. Stomach/Bowel: Stomach within normal limits. No evidence for bowel obstruction. Appendix normal. No acute inflammatory  changes seen about the bowels. No pneumatosis or evidence for bowel ischemia. Vascular/Lymphatic: Normal intravascular enhancement seen within the intra-abdominal aorta. There is irregular filling defect within the superior mesenteric artery, concerning for thrombus (series 2, image 33). Filling defects extend into the distal SMA branches. Thrombus is partially occlusive proximally, not well evaluated distally. Remainder of the major arterial branches extending from the aorta appear patent proximally with no other identifiable thrombus. No pathologically enlarged intra-abdominal or pelvic lymph nodes. Reproductive: Prostate appears to be absent. Other: No free air or fluid. Mildly complex fat containing paraumbilical hernia noted. Musculoskeletal: No acute osseus abnormality. No worrisome lytic or blastic osseous lesions. IMPRESSION: 1. Irregular filling defect within the proximal superior mesenteric artery, concerning for thrombus. Vascular surgery consultation recommended. Correlation with serum lactate suggested. No CT findings to suggest acute bowel ischemia at this time. 2. No other acute intra-abdominal or pelvic process. 3. 3 mm nonobstructive right renal nephrolithiasis. 4. Sequelae of prior prostatectomy. Critical Value/emergent results were called by telephone at the time of interpretation on 05/14/2017 at 11:42 pm to the physician assistant taking care of the patient Montine Circle , who verbally acknowledged these results. Electronically Signed   By: Jeannine Boga M.D.   On: 05/14/2017 23:44    Procedures Procedures (including critical care time) CRITICAL CARE Performed by: Montine Circle   Total critical care time: 49 minutes  Critical care time was exclusive of separately billable procedures and treating other patients.  Critical care was necessary to treat or prevent imminent or life-threatening deterioration.  Critical care was time spent personally by me on the following  activities: development of treatment plan with patient and/or surrogate as well as nursing, discussions with consultants, evaluation of patient's response to treatment, examination of patient, obtaining history from patient or surrogate, ordering and performing treatments and interventions, ordering and review of laboratory studies, ordering and review of radiographic studies, pulse oximetry and re-evaluation of patient's condition.  Medications Ordered in ED Medications  fentaNYL (SUBLIMAZE) injection 50 mcg (50 mcg Intravenous Given 05/14/17 2134)  ondansetron (ZOFRAN) injection 4 mg (4 mg Intravenous Given 05/14/17 2134)  HYDROmorphone (DILAUDID) injection 1 mg (1 mg Intravenous Given 05/14/17 2211)  gi cocktail (Maalox,Lidocaine,Donnatal) (30 mLs Oral Given 05/14/17 2210)     Initial Impression / Assessment and Plan / ED Course  I have reviewed the triage vital signs and the nursing notes.  Pertinent labs & imaging results that were available during my care of the patient  were reviewed by me and considered in my medical decision making (see chart for details).     Patient with epigastric abdominal pain now radiates to the right lower quadrant.  Denies chest pain or shortness of breath.  Denies any burning in throat sensation.  We will check labs and imaging.  Will get CT of abdomen as well as chest x-ray.  Treat pain, will reassess.  2342- Received phone call from radiology informing me of probable SMA occlusion.  Th Heparin ordered per my attending Dr. Jeneen Rinks.  12:18 AM Will re-page vascular. Discussed the case with Dr. Bridgett Larsson, who recommends immediate transfer to [facility] ED and mesenteric angiogram by IR.  Dr. Bridgett Larsson will take the patient to the OR.  Patient will be sent emergency traffic.  Dr. Bridgett Larsson called me back to say that he will get the mesenteric angiogram in the OR, and to go ahead and forego contacting IR.  Confirmed with charge nurse that [facility] is coming emergency  traffic.    Final Clinical Impressions(s) / ED Diagnoses   Final diagnoses:  Occlusion of superior mesenteric artery [facility])    ED Discharge Orders    None       Montine Circle, PA-C 05/15/17 0053    Tanna Furry, MD 05/15/17 [phone]

## 2017-05-15 ENCOUNTER — Emergency Department (HOSPITAL_COMMUNITY): Payer: 59 | Admitting: Anesthesiology

## 2017-05-15 ENCOUNTER — Encounter (HOSPITAL_COMMUNITY)
Admission: EM | Disposition: A | Discharge status: Home / Self Care | Payer: Self-pay | Source: Home / Self Care | Attending: Vascular Surgery

## 2017-05-15 ENCOUNTER — Inpatient Hospital Stay (HOSPITAL_COMMUNITY): Payer: 59

## 2017-05-15 DIAGNOSIS — N179 Acute kidney failure, unspecified: Secondary | ICD-10-CM

## 2017-05-15 DIAGNOSIS — G4733 Obstructive sleep apnea (adult) (pediatric): Secondary | ICD-10-CM | POA: Diagnosis present

## 2017-05-15 DIAGNOSIS — R0902 Hypoxemia: Secondary | ICD-10-CM | POA: Diagnosis not present

## 2017-05-15 DIAGNOSIS — K91870 Postprocedural hematoma of a digestive system organ or structure following a digestive system procedure: Secondary | ICD-10-CM | POA: Diagnosis present

## 2017-05-15 DIAGNOSIS — J9600 Acute respiratory failure, unspecified whether with hypoxia or hypercapnia: Secondary | ICD-10-CM | POA: Diagnosis not present

## 2017-05-15 DIAGNOSIS — K55059 Acute (reversible) ischemia of intestine, part and extent unspecified: Secondary | ICD-10-CM | POA: Diagnosis present

## 2017-05-15 DIAGNOSIS — J9601 Acute respiratory failure with hypoxia: Secondary | ICD-10-CM | POA: Diagnosis not present

## 2017-05-15 DIAGNOSIS — I952 Hypotension due to drugs: Secondary | ICD-10-CM | POA: Diagnosis not present

## 2017-05-15 DIAGNOSIS — E876 Hypokalemia: Secondary | ICD-10-CM

## 2017-05-15 DIAGNOSIS — R0602 Shortness of breath: Secondary | ICD-10-CM | POA: Diagnosis not present

## 2017-05-15 DIAGNOSIS — R109 Unspecified abdominal pain: Secondary | ICD-10-CM | POA: Diagnosis not present

## 2017-05-15 DIAGNOSIS — Y92230 Patient room in hospital as the place of occurrence of the external cause: Secondary | ICD-10-CM | POA: Diagnosis not present

## 2017-05-15 DIAGNOSIS — K559 Vascular disorder of intestine, unspecified: Secondary | ICD-10-CM | POA: Diagnosis present

## 2017-05-15 DIAGNOSIS — T8130XA Disruption of wound, unspecified, initial encounter: Secondary | ICD-10-CM | POA: Diagnosis present

## 2017-05-15 DIAGNOSIS — I739 Peripheral vascular disease, unspecified: Secondary | ICD-10-CM | POA: Diagnosis not present

## 2017-05-15 DIAGNOSIS — T404X5A Adverse effect of other synthetic narcotics, initial encounter: Secondary | ICD-10-CM | POA: Diagnosis not present

## 2017-05-15 DIAGNOSIS — I10 Essential (primary) hypertension: Secondary | ICD-10-CM | POA: Diagnosis not present

## 2017-05-15 DIAGNOSIS — Z4682 Encounter for fitting and adjustment of non-vascular catheter: Secondary | ICD-10-CM | POA: Diagnosis not present

## 2017-05-15 DIAGNOSIS — R748 Abnormal levels of other serum enzymes: Secondary | ICD-10-CM | POA: Diagnosis not present

## 2017-05-15 DIAGNOSIS — K56609 Unspecified intestinal obstruction, unspecified as to partial versus complete obstruction: Secondary | ICD-10-CM | POA: Diagnosis not present

## 2017-05-15 DIAGNOSIS — J9690 Respiratory failure, unspecified, unspecified whether with hypoxia or hypercapnia: Secondary | ICD-10-CM | POA: Diagnosis not present

## 2017-05-15 DIAGNOSIS — D72829 Elevated white blood cell count, unspecified: Secondary | ICD-10-CM | POA: Diagnosis not present

## 2017-05-15 DIAGNOSIS — T41295A Adverse effect of other general anesthetics, initial encounter: Secondary | ICD-10-CM | POA: Diagnosis not present

## 2017-05-15 DIAGNOSIS — I7779 Dissection of other artery: Secondary | ICD-10-CM | POA: Diagnosis not present

## 2017-05-15 DIAGNOSIS — R579 Shock, unspecified: Secondary | ICD-10-CM | POA: Diagnosis not present

## 2017-05-15 DIAGNOSIS — D62 Acute posthemorrhagic anemia: Secondary | ICD-10-CM | POA: Diagnosis not present

## 2017-05-15 DIAGNOSIS — K55069 Acute infarction of intestine, part and extent unspecified: Secondary | ICD-10-CM

## 2017-05-15 DIAGNOSIS — G9349 Other encephalopathy: Secondary | ICD-10-CM | POA: Diagnosis not present

## 2017-05-15 DIAGNOSIS — I7770 Dissection of unspecified artery: Secondary | ICD-10-CM | POA: Diagnosis not present

## 2017-05-15 DIAGNOSIS — R1084 Generalized abdominal pain: Secondary | ICD-10-CM | POA: Diagnosis not present

## 2017-05-15 DIAGNOSIS — N184 Chronic kidney disease, stage 4 (severe): Secondary | ICD-10-CM | POA: Diagnosis not present

## 2017-05-15 DIAGNOSIS — H9191 Unspecified hearing loss, right ear: Secondary | ICD-10-CM | POA: Diagnosis present

## 2017-05-15 DIAGNOSIS — J309 Allergic rhinitis, unspecified: Secondary | ICD-10-CM | POA: Diagnosis not present

## 2017-05-15 DIAGNOSIS — R140 Abdominal distension (gaseous): Secondary | ICD-10-CM | POA: Diagnosis not present

## 2017-05-15 DIAGNOSIS — T8131XA Disruption of external operation (surgical) wound, not elsewhere classified, initial encounter: Secondary | ICD-10-CM | POA: Diagnosis not present

## 2017-05-15 DIAGNOSIS — K9189 Other postprocedural complications and disorders of digestive system: Secondary | ICD-10-CM | POA: Diagnosis not present

## 2017-05-15 DIAGNOSIS — I161 Hypertensive emergency: Secondary | ICD-10-CM | POA: Diagnosis not present

## 2017-05-15 DIAGNOSIS — R578 Other shock: Secondary | ICD-10-CM | POA: Diagnosis not present

## 2017-05-15 DIAGNOSIS — J9811 Atelectasis: Secondary | ICD-10-CM | POA: Diagnosis not present

## 2017-05-15 DIAGNOSIS — K660 Peritoneal adhesions (postprocedural) (postinfection): Secondary | ICD-10-CM | POA: Diagnosis present

## 2017-05-15 DIAGNOSIS — Z781 Physical restraint status: Secondary | ICD-10-CM | POA: Diagnosis not present

## 2017-05-15 DIAGNOSIS — Y838 Other surgical procedures as the cause of abnormal reaction of the patient, or of later complication, without mention of misadventure at the time of the procedure: Secondary | ICD-10-CM | POA: Diagnosis present

## 2017-05-15 DIAGNOSIS — K567 Ileus, unspecified: Secondary | ICD-10-CM | POA: Diagnosis not present

## 2017-05-15 HISTORY — PX: PATCH ANGIOPLASTY: SHX6230

## 2017-05-15 HISTORY — PX: AORTOGRAM: SHX6300

## 2017-05-15 HISTORY — PX: MESENTERIC ARTERY BYPASS: SHX5968

## 2017-05-15 HISTORY — PX: PERCUTANEOUS VENOUS THROMBECTOMY,LYSIS WITH INTRAVASCULAR ULTRASOUND (IVUS): SHX6751

## 2017-05-15 LAB — CBC
HEMATOCRIT: 34.7 % — AB (ref 39.0–52.0)
HEMOGLOBIN: 11.6 g/dL — AB (ref 13.0–17.0)
MCH: 27.9 pg (ref 26.0–34.0)
MCHC: 33.4 g/dL (ref 30.0–36.0)
MCV: 83.4 fL (ref 78.0–100.0)
Platelets: 149 10*3/uL — ABNORMAL LOW (ref 150–400)
RBC: 4.16 MIL/uL — ABNORMAL LOW (ref 4.22–5.81)
RDW: 14.8 % (ref 11.5–15.5)
WBC: 11.6 10*3/uL — AB (ref 4.0–10.5)

## 2017-05-15 LAB — BASIC METABOLIC PANEL
ANION GAP: 7 (ref 5–15)
BUN: 9 mg/dL (ref 6–20)
CHLORIDE: 103 mmol/L (ref 101–111)
CO2: 23 mmol/L (ref 22–32)
Calcium: 7.4 mg/dL — ABNORMAL LOW (ref 8.9–10.3)
Creatinine, Ser: 1.16 mg/dL (ref 0.61–1.24)
GFR calc non Af Amer: >60 mL/min (ref >60)
Glucose, Bld: 143 mg/dL — ABNORMAL HIGH (ref 65–99)
Potassium: 5.2 mmol/L — ABNORMAL HIGH (ref 3.5–5.1)
Sodium: 133 mmol/L — ABNORMAL LOW (ref 135–145)

## 2017-05-15 LAB — APTT
APTT: 28 s (ref 24–36)
aPTT: 23 seconds — ABNORMAL LOW (ref 24–36)

## 2017-05-15 LAB — POCT I-STAT 3, ART BLOOD GAS (G3+)
BICARBONATE: 26.1 mmol/L (ref 20.0–28.0)
O2 Saturation: 96 %
PH ART: 7.343 — AB (ref 7.350–7.450)
PO2 ART: 88 mmHg (ref 83.0–108.0)
TCO2: 28 mmol/L (ref 22–32)
pCO2 arterial: 48.2 mmHg — ABNORMAL HIGH (ref 32.0–48.0)

## 2017-05-15 LAB — PROTIME-INR
INR: 0.97
INR: 1.19
Prothrombin Time: 12.8 seconds (ref 11.4–15.2)
Prothrombin Time: 15 seconds (ref 11.4–15.2)

## 2017-05-15 LAB — LIPASE, BLOOD: LIPASE: 58 U/L — AB (ref 11–51)

## 2017-05-15 LAB — AMYLASE: AMYLASE: 126 U/L — AB (ref 28–100)

## 2017-05-15 LAB — LACTIC ACID, PLASMA
LACTIC ACID, VENOUS: 3.2 mmol/L — AB (ref 0.5–1.9)
LACTIC ACID, VENOUS: 1.9 mmol/L (ref 0.5–1.9)

## 2017-05-15 LAB — ABO/RH: ABO/RH(D): A POS

## 2017-05-15 LAB — I-STAT CG4 LACTIC ACID, ED: Lactic Acid, Venous: 1.78 mmol/L (ref 0.5–1.9)

## 2017-05-15 LAB — HEPARIN LEVEL (UNFRACTIONATED): Heparin Unfractionated: 0.34 IU/mL (ref 0.30–0.70)

## 2017-05-15 LAB — PHOSPHORUS: Phosphorus: 2.7 mg/dL (ref 2.5–4.6)

## 2017-05-15 LAB — MRSA PCR SCREENING: MRSA by PCR: NEGATIVE

## 2017-05-15 LAB — PREPARE RBC (CROSSMATCH)

## 2017-05-15 LAB — MAGNESIUM: MAGNESIUM: 1.5 mg/dL — AB (ref 1.7–2.4)

## 2017-05-15 SURGERY — THROMBECTOMY, VEIN, PERCUTANEOUS
Anesthesia: General

## 2017-05-15 MED ORDER — SODIUM CHLORIDE 0.9 % IV SOLN
0.0000 ug/min | INTRAVENOUS | Status: DC
  Filled 2017-05-15: qty 1

## 2017-05-15 MED ORDER — SODIUM CHLORIDE 0.9 % IV SOLN
1.0000 g | Freq: Once | INTRAVENOUS | Status: AC
  Administered 2017-05-15: 1 g via INTRAVENOUS
  Filled 2017-05-15: qty 1

## 2017-05-15 MED ORDER — SODIUM CHLORIDE 0.9 % IV SOLN: 500.0000 mL | Freq: Once | INTRAVENOUS | Status: DC | PRN

## 2017-05-15 MED ORDER — HYDRALAZINE HCL 20 MG/ML IJ SOLN
5.0000 mg | INTRAMUSCULAR | Status: AC | PRN
  Administered 2017-05-17 (×2): 5 mg via INTRAVENOUS
  Filled 2017-05-15 (×2): qty 1

## 2017-05-15 MED ORDER — ORAL CARE MOUTH RINSE
15.0000 mL | Freq: Four times a day (QID) | OROMUCOSAL | Status: DC
  Administered 2017-05-15: 15 mL via OROMUCOSAL

## 2017-05-15 MED ORDER — FENTANYL CITRATE (PF) 100 MCG/2ML IJ SOLN
INTRAMUSCULAR | Status: AC
  Administered 2017-05-15: 50 ug via INTRAVENOUS
  Filled 2017-05-15: qty 2

## 2017-05-15 MED ORDER — SODIUM CHLORIDE 0.9 % IV SOLN
INTRAVENOUS | Status: DC | PRN
  Administered 2017-05-15: 03:00:00

## 2017-05-15 MED ORDER — FENTANYL CITRATE (PF) 250 MCG/5ML IJ SOLN
INTRAMUSCULAR | Status: AC
  Filled 2017-05-15: qty 5

## 2017-05-15 MED ORDER — OXYCODONE HCL 5 MG/5ML PO SOLN: 5.0000 mg | Freq: Once | ORAL | Status: DC | PRN

## 2017-05-15 MED ORDER — DEXMEDETOMIDINE HCL IN NACL 400 MCG/100ML IV SOLN
0.0000 ug/kg/h | INTRAVENOUS | Status: DC
  Administered 2017-05-15 – 2017-05-16 (×5): 0.8 ug/kg/h via INTRAVENOUS
  Administered 2017-05-17: 0.7 ug/kg/h via INTRAVENOUS
  Administered 2017-05-17: 0.8 ug/kg/h via INTRAVENOUS
  Filled 2017-05-15 (×9): qty 100

## 2017-05-15 MED ORDER — HEPARIN SODIUM (PORCINE) 1000 UNIT/ML IJ SOLN
INTRAMUSCULAR | Status: DC | PRN
Start: 2017-05-15 — End: 2017-05-15
  Administered 2017-05-15 (×4): 2000 [IU] via INTRAVENOUS
  Administered 2017-05-15: 10000 [IU] via INTRAVENOUS
  Administered 2017-05-15: 2000 [IU] via INTRAVENOUS

## 2017-05-15 MED ORDER — FENTANYL 2500MCG IN NS 250ML (10MCG/ML) PREMIX INFUSION
25.0000 ug/h | INTRAVENOUS | Status: DC
  Administered 2017-05-15: 50 ug/h via INTRAVENOUS
  Administered 2017-05-16 (×2): 200 ug/h via INTRAVENOUS
  Filled 2017-05-15 (×3): qty 250

## 2017-05-15 MED ORDER — FENTANYL CITRATE (PF) 100 MCG/2ML IJ SOLN
INTRAMUSCULAR | Status: DC | PRN
  Administered 2017-05-15: 100 ug via INTRAVENOUS
  Administered 2017-05-15 (×2): 50 ug via INTRAVENOUS
  Administered 2017-05-15: 100 ug via INTRAVENOUS
  Administered 2017-05-15: 150 ug via INTRAVENOUS
  Administered 2017-05-15 (×2): 50 ug via INTRAVENOUS
  Administered 2017-05-15: 100 ug via INTRAVENOUS
  Administered 2017-05-15: 50 ug via INTRAVENOUS
  Administered 2017-05-15 (×3): 100 ug via INTRAVENOUS
  Administered 2017-05-15: 50 ug via INTRAVENOUS
  Administered 2017-05-15: 100 ug via INTRAVENOUS
  Administered 2017-05-15: 50 ug via INTRAVENOUS

## 2017-05-15 MED ORDER — MAGNESIUM SULFATE 2 GM/50ML IV SOLN
2.0000 g | Freq: Every day | INTRAVENOUS | Status: AC | PRN
  Administered 2017-05-15: 2 g via INTRAVENOUS
  Filled 2017-05-15: qty 50

## 2017-05-15 MED ORDER — LACTATED RINGERS IV SOLN
INTRAVENOUS | Status: DC | PRN
Start: 2017-05-15 — End: 2017-05-15
  Administered 2017-05-15 (×4): via INTRAVENOUS

## 2017-05-15 MED ORDER — SUCCINYLCHOLINE CHLORIDE 20 MG/ML IJ SOLN
INTRAMUSCULAR | Status: DC | PRN
  Administered 2017-05-15: 80 mg via INTRAVENOUS

## 2017-05-15 MED ORDER — GUAIFENESIN-DM 100-10 MG/5ML PO SYRP: 15.0000 mL | ORAL_SOLUTION | ORAL | Status: DC | PRN

## 2017-05-15 MED ORDER — FENTANYL CITRATE (PF) 100 MCG/2ML IJ SOLN
50.0000 ug | Freq: Once | INTRAMUSCULAR | Status: AC
  Administered 2017-05-15: 50 ug via INTRAVENOUS

## 2017-05-15 MED ORDER — PROTAMINE SULFATE 10 MG/ML IV SOLN
INTRAVENOUS | Status: AC
  Filled 2017-05-15: qty 25

## 2017-05-15 MED ORDER — LABETALOL HCL 5 MG/ML IV SOLN
10.0000 mg | INTRAVENOUS | Status: AC | PRN
  Administered 2017-05-17 (×4): 10 mg via INTRAVENOUS
  Filled 2017-05-15 (×4): qty 4

## 2017-05-15 MED ORDER — DEXTROSE 5 % IV SOLN
INTRAVENOUS | Status: DC | PRN
  Administered 2017-05-15: 15 ug/min via INTRAVENOUS

## 2017-05-15 MED ORDER — OXYCODONE HCL 5 MG PO TABS: 5.0000 mg | ORAL_TABLET | Freq: Once | ORAL | Status: DC | PRN

## 2017-05-15 MED ORDER — PHENOL 1.4 % MT LIQD: 1.0000 | OROMUCOSAL | Status: DC | PRN

## 2017-05-15 MED ORDER — KCL IN DEXTROSE-NACL 20-5-0.45 MEQ/L-%-% IV SOLN
INTRAVENOUS | Status: DC
  Filled 2017-05-15: qty 1000

## 2017-05-15 MED ORDER — MIDAZOLAM HCL 5 MG/5ML IJ SOLN
INTRAMUSCULAR | Status: DC | PRN
  Administered 2017-05-15 (×2): 1 mg via INTRAVENOUS

## 2017-05-15 MED ORDER — BISACODYL 10 MG RE SUPP
10.0000 mg | Freq: Every day | RECTAL | Status: DC | PRN
  Administered 2017-05-21: 10 mg via RECTAL
  Filled 2017-05-15 (×2): qty 1

## 2017-05-15 MED ORDER — ACETAMINOPHEN 325 MG PO TABS
325.0000 mg | ORAL_TABLET | ORAL | Status: DC | PRN
  Administered 2017-05-16 – 2017-05-24 (×3): 650 mg via ORAL
  Filled 2017-05-15 (×4): qty 2

## 2017-05-15 MED ORDER — PHENYLEPHRINE 40 MCG/ML (10ML) SYRINGE FOR IV PUSH (FOR BLOOD PRESSURE SUPPORT)
PREFILLED_SYRINGE | INTRAVENOUS | Status: AC
  Filled 2017-05-15: qty 20

## 2017-05-15 MED ORDER — LIDOCAINE HCL (CARDIAC) 20 MG/ML IV SOLN
INTRAVENOUS | Status: DC | PRN
  Administered 2017-05-15: 80 mg via INTRAVENOUS

## 2017-05-15 MED ORDER — DEXTROSE 5 % IV SOLN
1.5000 g | Freq: Two times a day (BID) | INTRAVENOUS | Status: AC
  Administered 2017-05-15 – 2017-05-16 (×2): 1.5 g via INTRAVENOUS
  Filled 2017-05-15 (×2): qty 1.5

## 2017-05-15 MED ORDER — PROTAMINE SULFATE 10 MG/ML IV SOLN
INTRAVENOUS | Status: DC | PRN
  Administered 2017-05-15: 10 mg via INTRAVENOUS
  Administered 2017-05-15: 50 mg via INTRAVENOUS

## 2017-05-15 MED ORDER — ROCURONIUM BROMIDE 10 MG/ML (PF) SYRINGE
PREFILLED_SYRINGE | INTRAVENOUS | Status: AC
  Filled 2017-05-15: qty 10

## 2017-05-15 MED ORDER — HYDROMORPHONE HCL 1 MG/ML IJ SOLN: 0.2500 mg | INTRAMUSCULAR | Status: DC | PRN

## 2017-05-15 MED ORDER — CHLORHEXIDINE GLUCONATE 0.12% ORAL RINSE (MEDLINE KIT)
15.0000 mL | Freq: Two times a day (BID) | OROMUCOSAL | Status: DC
  Administered 2017-05-16 – 2017-05-17 (×3): 15 mL via OROMUCOSAL

## 2017-05-15 MED ORDER — SODIUM BICARBONATE 4.2 % IV SOLN
INTRAVENOUS | Status: DC | PRN
  Administered 2017-05-15: 50 mL via INTRAVENOUS

## 2017-05-15 MED ORDER — ROCURONIUM BROMIDE 100 MG/10ML IV SOLN
INTRAVENOUS | Status: DC | PRN
Start: 2017-05-15 — End: 2017-05-15
  Administered 2017-05-15 (×5): 20 mg via INTRAVENOUS
  Administered 2017-05-15: 30 mg via INTRAVENOUS
  Administered 2017-05-15: 20 mg via INTRAVENOUS
  Administered 2017-05-15: 40 mg via INTRAVENOUS

## 2017-05-15 MED ORDER — ONDANSETRON HCL 4 MG/2ML IJ SOLN: 4.0000 mg | Freq: Four times a day (QID) | INTRAMUSCULAR | Status: DC | PRN

## 2017-05-15 MED ORDER — MIDAZOLAM HCL 2 MG/2ML IJ SOLN
2.0000 mg | INTRAMUSCULAR | Status: DC | PRN
  Administered 2017-05-15: 2 mg via INTRAVENOUS

## 2017-05-15 MED ORDER — POTASSIUM CHLORIDE CRYS ER 20 MEQ PO TBCR
20.0000 meq | EXTENDED_RELEASE_TABLET | Freq: Every day | ORAL | Status: AC | PRN
  Administered 2017-05-18: 20 meq via ORAL
  Filled 2017-05-15: qty 1

## 2017-05-15 MED ORDER — ALUM & MAG HYDROXIDE-SIMETH 200-200-20 MG/5ML PO SUSP: 15.0000 mL | ORAL | Status: DC | PRN

## 2017-05-15 MED ORDER — DEXTROSE IN LACTATED RINGERS 5 % IV SOLN
INTRAVENOUS | Status: AC
  Administered 2017-05-15 – 2017-05-18 (×4): via INTRAVENOUS
  Administered 2017-05-18: 75 mL/h via INTRAVENOUS

## 2017-05-15 MED ORDER — DEXAMETHASONE SODIUM PHOSPHATE 10 MG/ML IJ SOLN
INTRAMUSCULAR | Status: AC
  Filled 2017-05-15: qty 1

## 2017-05-15 MED ORDER — DOCUSATE SODIUM 100 MG PO CAPS: 100.0000 mg | ORAL_CAPSULE | Freq: Every day | ORAL | Status: DC

## 2017-05-15 MED ORDER — EPHEDRINE SULFATE-NACL 50-0.9 MG/10ML-% IV SOSY
PREFILLED_SYRINGE | INTRAVENOUS | Status: DC | PRN
  Administered 2017-05-15: 5 mg via INTRAVENOUS

## 2017-05-15 MED ORDER — HEPARIN (PORCINE) IN NACL 100-0.45 UNIT/ML-% IJ SOLN
1800.0000 [IU]/h | INTRAMUSCULAR | Status: DC
  Administered 2017-05-15 – 2017-05-17 (×3): 1200 [IU]/h via INTRAVENOUS
  Administered 2017-05-18: 1450 [IU]/h via INTRAVENOUS
  Administered 2017-05-18: 1650 [IU]/h via INTRAVENOUS
  Administered 2017-05-19: 1800 [IU]/h via INTRAVENOUS
  Filled 2017-05-15 (×7): qty 250

## 2017-05-15 MED ORDER — ACETAMINOPHEN 325 MG PO TABS: 650.0000 mg | ORAL_TABLET | ORAL | Status: DC | PRN

## 2017-05-15 MED ORDER — MIDAZOLAM HCL 2 MG/2ML IJ SOLN
INTRAMUSCULAR | Status: AC
  Filled 2017-05-15: qty 2

## 2017-05-15 MED ORDER — HEMOSTATIC AGENTS (NO CHARGE) OPTIME
TOPICAL | Status: DC | PRN
  Administered 2017-05-15: 1 via TOPICAL

## 2017-05-15 MED ORDER — LACTATED RINGERS IV SOLN
INTRAVENOUS | Status: DC | PRN
  Administered 2017-05-15 (×2): via INTRAVENOUS

## 2017-05-15 MED ORDER — PROPOFOL 10 MG/ML IV BOLUS
INTRAVENOUS | Status: DC | PRN
  Administered 2017-05-15: 10 mg via INTRAVENOUS
  Administered 2017-05-15: 140 mg via INTRAVENOUS

## 2017-05-15 MED ORDER — ORAL CARE MOUTH RINSE
15.0000 mL | OROMUCOSAL | Status: DC
  Administered 2017-05-15 – 2017-05-17 (×16): 15 mL via OROMUCOSAL

## 2017-05-15 MED ORDER — PROPOFOL 10 MG/ML IV BOLUS
INTRAVENOUS | Status: AC
  Filled 2017-05-15: qty 20

## 2017-05-15 MED ORDER — DOCUSATE SODIUM 50 MG/5ML PO LIQD
100.0000 mg | Freq: Two times a day (BID) | ORAL | Status: DC
  Administered 2017-05-15 – 2017-05-17 (×4): 100 mg
  Filled 2017-05-15 (×4): qty 10

## 2017-05-15 MED ORDER — CHLORHEXIDINE GLUCONATE 0.12% ORAL RINSE (MEDLINE KIT)
15.0000 mL | Freq: Two times a day (BID) | OROMUCOSAL | Status: DC
  Administered 2017-05-15: 15 mL via OROMUCOSAL

## 2017-05-15 MED ORDER — HYDROMORPHONE HCL 1 MG/ML IJ SOLN: 0.5000 mg | INTRAMUSCULAR | Status: DC | PRN

## 2017-05-15 MED ORDER — MIDAZOLAM HCL 2 MG/2ML IJ SOLN
INTRAMUSCULAR | Status: AC
  Administered 2017-05-15: 2 mg via INTRAVENOUS
  Filled 2017-05-15: qty 2

## 2017-05-15 MED ORDER — HEPARIN BOLUS VIA INFUSION
4000.0000 [IU] | Freq: Once | INTRAVENOUS | Status: AC
  Administered 2017-05-15: 4000 [IU] via INTRAVENOUS
  Filled 2017-05-15: qty 4000

## 2017-05-15 MED ORDER — 0.9 % SODIUM CHLORIDE (POUR BTL) OPTIME
TOPICAL | Status: DC | PRN
  Administered 2017-05-15: 2000 mL

## 2017-05-15 MED ORDER — DOCUSATE SODIUM 50 MG/5ML PO LIQD: 100.0000 mg | Freq: Two times a day (BID) | ORAL | Status: DC | PRN

## 2017-05-15 MED ORDER — ALBUMIN HUMAN 5 % IV SOLN
INTRAVENOUS | Status: DC | PRN
  Administered 2017-05-15 (×4): via INTRAVENOUS

## 2017-05-15 MED ORDER — IODIXANOL 320 MG/ML IV SOLN
INTRAVENOUS | Status: DC | PRN
  Administered 2017-05-15 (×2): 150 mL via INTRAVENOUS

## 2017-05-15 MED ORDER — FENTANYL BOLUS VIA INFUSION
50.0000 ug | INTRAVENOUS | Status: DC | PRN
  Filled 2017-05-15: qty 50

## 2017-05-15 MED ORDER — DEXAMETHASONE SODIUM PHOSPHATE 10 MG/ML IJ SOLN
INTRAMUSCULAR | Status: DC | PRN
Start: 2017-05-15 — End: 2017-05-15
  Administered 2017-05-15: 10 mg via INTRAVENOUS

## 2017-05-15 MED ORDER — DEXMEDETOMIDINE HCL IN NACL 200 MCG/50ML IV SOLN
INTRAVENOUS | Status: DC | PRN
  Administered 2017-05-15: .1 ug/kg/h via INTRAVENOUS

## 2017-05-15 MED ORDER — FENTANYL CITRATE (PF) 100 MCG/2ML IJ SOLN: 25.0000 ug | INTRAMUSCULAR | Status: DC | PRN

## 2017-05-15 MED ORDER — METOPROLOL TARTRATE 5 MG/5ML IV SOLN: 2.0000 mg | INTRAVENOUS | Status: DC | PRN

## 2017-05-15 MED ORDER — ALBUTEROL SULFATE (2.5 MG/3ML) 0.083% IN NEBU: 2.5000 mg | INHALATION_SOLUTION | RESPIRATORY_TRACT | Status: DC | PRN

## 2017-05-15 MED ORDER — SODIUM CHLORIDE 0.9 % IV BOLUS (SEPSIS)
1000.0000 mL | Freq: Once | INTRAVENOUS | Status: AC
  Administered 2017-05-15: 1000 mL via INTRAVENOUS

## 2017-05-15 MED ORDER — ACETAMINOPHEN 325 MG RE SUPP
325.0000 mg | RECTAL | Status: DC | PRN
  Filled 2017-05-15: qty 2

## 2017-05-15 MED ORDER — FAMOTIDINE IN NACL 20-0.9 MG/50ML-% IV SOLN
20.0000 mg | Freq: Two times a day (BID) | INTRAVENOUS | Status: DC
  Administered 2017-05-15 – 2017-05-18 (×7): 20 mg via INTRAVENOUS
  Filled 2017-05-15 (×7): qty 50

## 2017-05-15 MED ORDER — MIDAZOLAM HCL 2 MG/2ML IJ SOLN
2.0000 mg | INTRAMUSCULAR | Status: DC | PRN
Start: 2017-05-15 — End: 2017-05-17

## 2017-05-15 MED ORDER — SODIUM CHLORIDE 0.9 % IV SOLN
250.0000 mL | INTRAVENOUS | Status: DC | PRN
  Administered 2017-05-19 – 2017-05-22 (×2): 250 mL via INTRAVENOUS

## 2017-05-15 MED ORDER — ONDANSETRON HCL 4 MG/2ML IJ SOLN
4.0000 mg | Freq: Four times a day (QID) | INTRAMUSCULAR | Status: DC | PRN
Start: 2017-05-15 — End: 2017-05-28
  Administered 2017-05-18 – 2017-05-28 (×7): 4 mg via INTRAVENOUS
  Filled 2017-05-15 (×7): qty 2

## 2017-05-15 MED ORDER — PANTOPRAZOLE SODIUM 40 MG PO TBEC
40.0000 mg | DELAYED_RELEASE_TABLET | Freq: Every day | ORAL | Status: DC
  Administered 2017-05-15 – 2017-05-16 (×2): 40 mg via ORAL
  Filled 2017-05-15 (×2): qty 1

## 2017-05-15 MED ORDER — HEPARIN (PORCINE) IN NACL 100-0.45 UNIT/ML-% IJ SOLN
1300.0000 [IU]/h | INTRAMUSCULAR | Status: DC
  Administered 2017-05-15: 1300 [IU]/h via INTRAVENOUS
  Filled 2017-05-15: qty 250

## 2017-05-15 MED ORDER — PHENYLEPHRINE HCL 10 MG/ML IJ SOLN
INTRAMUSCULAR | Status: DC | PRN
  Administered 2017-05-15: 80 ug via INTRAVENOUS
  Administered 2017-05-15 (×2): 40 ug via INTRAVENOUS
  Administered 2017-05-15 (×3): 80 ug via INTRAVENOUS
  Administered 2017-05-15 (×4): 40 ug via INTRAVENOUS
  Administered 2017-05-15 (×2): 80 ug via INTRAVENOUS

## 2017-05-15 SURGICAL SUPPLY — 95 items
BAG BANDED W/RUBBER/TAPE 36X54 (MISCELLANEOUS) ×2 IMPLANT
BAG SNAP BAND KOVER 36X36 (MISCELLANEOUS) ×4 IMPLANT
BLADE SURG 11 STRL SS (BLADE) ×2 IMPLANT
BOOT SUTURE AID YELLOW STND (SUTURE) ×2 IMPLANT
CANISTER SUCT 3000ML PPV (MISCELLANEOUS) ×2 IMPLANT
CATH ANGIO 5F BER2 65CM (CATHETERS) ×4 IMPLANT
CATH EMB 3FR 80CM (CATHETERS) ×2 IMPLANT
CATH EMB 4FR 80CM (CATHETERS) ×2 IMPLANT
CATH OMNI FLUSH .035X70CM (CATHETERS) ×2 IMPLANT
CATH ROBINSON RED A/P 18FR (CATHETERS) ×2 IMPLANT
CATH SOFT-VU 4F 65 STRAIGHT (CATHETERS) ×1 IMPLANT
CATH SOFT-VU STRAIGHT 4F 65CM (CATHETERS) ×1
CLIP VESOCCLUDE MED 24/CT (CLIP) ×2 IMPLANT
CLIP VESOCCLUDE SM WIDE 24/CT (CLIP) ×2 IMPLANT
COVER BACK TABLE 60X90IN (DRAPES) ×4 IMPLANT
COVER DOME SNAP 22 D (MISCELLANEOUS) ×6 IMPLANT
COVER PROBE W GEL 5X96 (DRAPES) ×6 IMPLANT
COVER SURGICAL LIGHT HANDLE (MISCELLANEOUS) ×2 IMPLANT
DERMABOND ADVANCED (GAUZE/BANDAGES/DRESSINGS) ×1
DERMABOND ADVANCED .7 DNX12 (GAUZE/BANDAGES/DRESSINGS) ×1 IMPLANT
DRAPE HALF SHEET 40X57 (DRAPES) ×2 IMPLANT
DRAPE UNIVERSAL PACK (DRAPES) ×2 IMPLANT
DRSG COVADERM 4X14 (GAUZE/BANDAGES/DRESSINGS) ×2 IMPLANT
DRSG TEGADERM 4X4.75 (GAUZE/BANDAGES/DRESSINGS) ×2 IMPLANT
ELECT BLADE 4.0 EZ CLEAN MEGAD (MISCELLANEOUS) ×2
ELECT REM PT RETURN 9FT ADLT (ELECTROSURGICAL)
ELECTRODE BLDE 4.0 EZ CLN MEGD (MISCELLANEOUS) ×1 IMPLANT
ELECTRODE REM PT RTRN 9FT ADLT (ELECTROSURGICAL) IMPLANT
GAUZE SPONGE 2X2 8PLY STRL LF (GAUZE/BANDAGES/DRESSINGS) ×1 IMPLANT
GAUZE SPONGE 4X4 12PLY STRL LF (GAUZE/BANDAGES/DRESSINGS) ×4 IMPLANT
GAUZE SPONGE 4X4 16PLY XRAY LF (GAUZE/BANDAGES/DRESSINGS) ×4 IMPLANT
GLOVE BIO SURGEON STRL SZ7 (GLOVE) ×8 IMPLANT
GLOVE BIO SURGEON STRL SZ7.5 (GLOVE) ×6 IMPLANT
GLOVE BIOGEL PI IND STRL 6.5 (GLOVE) ×2 IMPLANT
GLOVE BIOGEL PI IND STRL 7.5 (GLOVE) ×17 IMPLANT
GLOVE BIOGEL PI IND STRL 8 (GLOVE) ×2 IMPLANT
GLOVE BIOGEL PI INDICATOR 6.5 (GLOVE) ×2
GLOVE BIOGEL PI INDICATOR 7.5 (GLOVE) ×17
GLOVE BIOGEL PI INDICATOR 8 (GLOVE) ×2
GLOVE ECLIPSE 7.0 STRL STRAW (GLOVE) ×10 IMPLANT
GLOVE SURG SS PI 7.0 STRL IVOR (GLOVE) ×2 IMPLANT
GLOVE SURG SS PI 7.5 STRL IVOR (GLOVE) ×10 IMPLANT
GOWN STRL REUS W/ TWL LRG LVL3 (GOWN DISPOSABLE) ×17 IMPLANT
GOWN STRL REUS W/ TWL XL LVL3 (GOWN DISPOSABLE) ×1 IMPLANT
GOWN STRL REUS W/TWL LRG LVL3 (GOWN DISPOSABLE) ×17
GOWN STRL REUS W/TWL XL LVL3 (GOWN DISPOSABLE) ×1
HEMOSTAT SPONGE AVITENE ULTRA (HEMOSTASIS) ×2 IMPLANT
KIT BASIN OR (CUSTOM PROCEDURE TRAY) ×2 IMPLANT
KIT HEART LEFT (KITS) ×2 IMPLANT
LOOP VESSEL MINI RED (MISCELLANEOUS) ×4 IMPLANT
NEEDLE 18GX1X1/2 (RX/OR ONLY) (NEEDLE) ×2 IMPLANT
NEEDLE PERC 18GX7CM (NEEDLE) ×2 IMPLANT
NS IRRIG 1000ML POUR BTL (IV SOLUTION) ×4 IMPLANT
PACK PERIPHERAL VASCULAR (CUSTOM PROCEDURE TRAY) ×2 IMPLANT
PAD ARMBOARD 7.5X6 YLW CONV (MISCELLANEOUS) ×4 IMPLANT
PROTECTION STATION PRESSURIZED (MISCELLANEOUS) ×2
RETAINER VISCERA MED (MISCELLANEOUS) ×2 IMPLANT
SET MICROPUNCTURE 5F STIFF (MISCELLANEOUS) ×4 IMPLANT
SET ZELANTE DVT THROMB (CATHETERS) ×2 IMPLANT
SHEATH AVANTI 11CM 5FR (MISCELLANEOUS) IMPLANT
SHEATH PINNACLE 5F 10CM (SHEATH) ×2 IMPLANT
SHEATH PINNACLE 6F 10CM (SHEATH) ×2 IMPLANT
SHEATH PINNACLE 9F 10CM (SHEATH) IMPLANT
SPONGE GAUZE 2X2 STER 10/PKG (GAUZE/BANDAGES/DRESSINGS) ×1
SPONGE INTESTINAL PEANUT (DISPOSABLE) ×2 IMPLANT
SPONGE LAP 18X18 X RAY DECT (DISPOSABLE) ×4 IMPLANT
SPONGE TONSIL 1 RF SGL (DISPOSABLE) ×2 IMPLANT
STAPLER VISISTAT 35W (STAPLE) ×4 IMPLANT
STATION PROTECTION PRESSURIZED (MISCELLANEOUS) ×1 IMPLANT
STOPCOCK MORSE 400PSI 3WAY (MISCELLANEOUS) ×4 IMPLANT
STRIP PERIGUARD 6X8 (Vascular Products) ×2 IMPLANT
SUT MNCRL AB 4-0 PS2 18 (SUTURE) IMPLANT
SUT PDS AB 1 TP1 96 (SUTURE) ×4 IMPLANT
SUT PROLENE 5 0 C 1 24 (SUTURE) ×12 IMPLANT
SUT PROLENE 6 0 BV (SUTURE) ×34 IMPLANT
SUT PROLENE 7 0 BV 1 (SUTURE) ×6 IMPLANT
SUT SILK 3 0 TIES 10X30 (SUTURE) ×2 IMPLANT
SUT VIC AB 2-0 CT1 36 (SUTURE) ×4 IMPLANT
SUT VIC AB 3-0 SH 27 (SUTURE) ×2
SUT VIC AB 3-0 SH 27X BRD (SUTURE) ×2 IMPLANT
SYR 10ML LL (SYRINGE) ×8 IMPLANT
SYR 20CC LL (SYRINGE) ×6 IMPLANT
SYR 30ML LL (SYRINGE) ×2 IMPLANT
SYR MEDRAD MARK V 150ML (SYRINGE) ×4 IMPLANT
SYRINGE 1CC SLIP TB (MISCELLANEOUS) ×2 IMPLANT
SYRINGE 3CC LL L/F (MISCELLANEOUS) ×2 IMPLANT
TOWEL GREEN STERILE (TOWEL DISPOSABLE) ×2 IMPLANT
TOWEL GREEN STERILE FF (TOWEL DISPOSABLE) ×2 IMPLANT
TUBING ART PRESS 72  MALE/FEM (TUBING) ×1
TUBING ART PRESS 72 MALE/FEM (TUBING) ×1 IMPLANT
TUBING HIGH PRESSURE 120CM (CONNECTOR) ×4 IMPLANT
UNDERPAD 30X30 (UNDERPADS AND DIAPERS) IMPLANT
WATER STERILE IRR 1000ML POUR (IV SOLUTION) IMPLANT
WIRE BENTSON .035X145CM (WIRE) ×4 IMPLANT
WIRE ROSEN-J .035X260CM (WIRE) IMPLANT

## 2017-05-15 NOTE — Interval H&P Note (Signed)
History and Physical Interval Note:  05/15/2017 1:46 AM  Marcus Pruitt  has presented today for surgery, with the diagnosis of mesenteric clot  The various methods of treatment have been discussed with the patient and family. After consideration of risks, benefits and other options for treatment, the patient has consented to  Procedure(s): MESENTERIC ANGIOGRAM (N/A) THROMBECTOMY OF SMA (N/A) as a surgical intervention .  The patient's history has been reviewed, patient examined, no change in status, stable for surgery.  I have reviewed the patient's chart and labs.  Questions were answered to the patient's satisfaction.     Adele Barthel

## 2017-05-15 NOTE — OR Nursing (Signed)
1110-1121,right Groin pressure held for 26minutes by H.Eulas Post, ST.

## 2017-05-15 NOTE — ED Notes (Signed)
[facility] notified of need for Emergent transport-Karen charge RN at [facility] also made aware

## 2017-05-15 NOTE — Progress Notes (Signed)
CRITICAL VALUE ALERT  Critical Value:  Lactic 3.2 Date & Time Notied:  05/15/2017 1538   Provider Notified: paged Dr. Jimmey Ralph   Orders Received/Actions taken:new orders received will continue to monitor.

## 2017-05-15 NOTE — Anesthesia Preprocedure Evaluation (Signed)
Anesthesia Evaluation  Patient identified by MRN, date of birth, ID band Patient awake    Reviewed: Allergy & Precautions, NPO status , Patient's Chart, lab work & pertinent test results  History of Anesthesia Complications Negative for: history of anesthetic complications  Airway Mallampati: II  TM Distance: >3 FB Neck ROM: Full    Dental  (+) Teeth Intact,    Pulmonary neg shortness of breath, neg COPD, neg recent URI, former smoker,    breath sounds clear to auscultation       Cardiovascular hypertension, Pt. on medications + Peripheral Vascular Disease   Rhythm:Regular     Neuro/Psych negative neurological ROS  negative psych ROS   GI/Hepatic Neg liver ROS,   Endo/Other  negative endocrine ROS  Renal/GU negative Renal ROS     Musculoskeletal   Abdominal   Peds  Hematology negative hematology ROS (+)   Anesthesia Other Findings ? mesenteric ischemia, hypokalemia  Reproductive/Obstetrics                             Anesthesia Physical Anesthesia Plan  ASA: II and emergent  Anesthesia Plan: General   Post-op Pain Management:    Induction: Intravenous  PONV Risk Score and Plan: 2 and Ondansetron and Dexamethasone  Airway Management Planned: Oral ETT  Additional Equipment:   Intra-op Plan:   Post-operative Plan: Extubation in OR and Possible Post-op intubation/ventilation  Informed Consent: I have reviewed the patients History and Physical, chart, labs and discussed the procedure including the risks, benefits and alternatives for the proposed anesthesia with the patient or authorized representative who has indicated his/her understanding and acceptance.   Dental advisory given  Plan Discussed with: CRNA and Surgeon  Anesthesia Plan Comments:         Anesthesia Quick Evaluation

## 2017-05-15 NOTE — Consult Note (Signed)
PULMONARY / CRITICAL CARE MEDICINE   Name: Marcus Pruitt MRN: [id number] DOB: 1971-03-16    ADMISSION DATE:  05/14/2017 CONSULTATION DATE:  05/15/17  REFERRING MD: Dr Marcus Pruitt  CHIEF COMPLAINT: SMA dissection; Vent management  HISTORY OF PRESENT ILLNESS:   24yoM with hx HTN, OSA, Prostate cancer s/p resection, who presented to the [facility] ER on 11/16 PM c/o sudden onset excruciating mid-abdominal pain that began 11/16 at 8pm. CT showed questionable SMA clot. Patient started on heparin and transferred emergently to [facility] where he was brought to the OR and mesenteric angiogram showed it to actually be an SMA dissection, with multiple false lumens and with clot within it. Dr Marcus Pruitt says the OR case was long and complicated with approx 2 LITERS of blood loss. Postop he wants to keep patient intubated while we work to stabilize him, then will readdress vent weaning. At time of my exam, patient intubated and sedated but is moving BUE and biting ETT, appears uncomfortable. No family present. Remains on low dose phenylephrine.   PAST MEDICAL HISTORY :  He  has a past medical history of Allergy, Cancer (Marcus Pruitt), Hearing loss in right ear, Hypertension, Knee pain, Low back pain, and Sleep apnea.  PAST SURGICAL HISTORY: He  has a past surgical history that includes Mandible surgery (1990); Robot assisted laparoscopic radical prostatectomy (N/A, 02/21/2016); Lymphadenectomy (Bilateral, 02/21/2016); Inguinal hernia repair (Left, 4/96/7591); and Umbilical hernia repair (02/21/2016).  No Known Allergies  No current facility-administered medications on file prior to encounter.    Current Outpatient Medications on File Prior to Encounter  Medication Sig  . lisinopril-hydrochlorothiazide (PRINZIDE,ZESTORETIC) 10-12.5 MG tablet Take 1 tablet by mouth daily.  . benzonatate (TESSALON) 100 MG capsule Take 1 capsule (100 mg total) by mouth 2 (two) times daily as needed for cough. (Patient not taking: Reported on 05/14/2017)  .  cyclobenzaprine (FLEXERIL) 5 MG tablet Take 1 tablet (5 mg total) by mouth 3 (three) times daily as needed for muscle spasms. (Patient not taking: Reported on 05/14/2017)  . predniSONE (DELTASONE) 10 MG tablet Take 4 tablets once daily for 2 days, 3 tabs daily for 2 days, 2 tabs daily for 2 days, 1 tab daily for 2 days. (Patient not taking: Reported on 05/14/2017)   FAMILY HISTORY:  His indicated that the status of his father is unknown. He indicated that the status of his maternal grandmother is unknown.  SOCIAL HISTORY: He  reports that he quit smoking about 10 years ago. he has never used smokeless tobacco. He reports that he drinks alcohol. He reports that he does not use drugs.  REVIEW OF SYSTEMS:   Review of Systems  Unable to perform ROS: Critical illness   SUBJECTIVE:  Intubated and sedated  VITAL SIGNS: BP (!) 153/95 (BP Location: Left Arm)   Pulse 78   Temp 97.9 F (36.6 C) (Oral)   Resp 20   Ht 5\' 6"  (1.676 m)   Wt 98 kg (216 lb)   SpO2 100%   BMI 34.86 kg/m   HEMODYNAMICS:  phenylephrine @ 31mcg  VENTILATOR SETTINGS: Vent Mode: PRVC FiO2 (%):  [50 %] 50 % Set Rate:  [14 bmp] 14 bmp Vt Set:  [510 mL] 510 mL PEEP:  [5 cmH20] 5 cmH20 Plateau Pressure:  [23 cmH20] 23 cmH20  INTAKE / OUTPUT: I/O last 3 completed shifts: In: 3500 [I.V.:3000; IV Piggyback:500] Out: 1525 [Urine:525; Blood:1000]  PHYSICAL EXAMINATION: General: intubated, sedated, critically ill Neuro: RASS +2, eyes closed but reaching up for ETT, breathing over  vent HEENT: OP clear, MM moist, Orally intubated, PERRL Cardiovascular: RRR no m/r/g Lungs: CTA b/l Abdomen: Abdomen soft, mildly distended, BS absent, large vertical midline incision covered with surgical bandage Musculoskeletal: no LE edema Skin: no rashes   LABS:  BMET Recent Labs  Lab 05/14/17 2126  NA 136  K 2.9*  CL 99*  CO2 24  BUN 12  CREATININE 1.19  GLUCOSE 105*    Electrolytes Recent Labs  Lab 05/14/17 2126   CALCIUM 9.8    CBC Recent Labs  Lab 05/14/17 2126  WBC 5.8  HGB 15.8  HCT 46.7  PLT 197    Coag's Recent Labs  Lab 05/15/17 0031  APTT 23*  INR 0.97    Sepsis Markers Recent Labs  Lab 05/15/17 0009  LATICACIDVEN 1.78    ABG No results for input(s): PHART, PCO2ART, PO2ART in the last 168 hours.  Liver Enzymes Recent Labs  Lab 05/14/17 2126  AST 41  ALT 34  ALKPHOS 47  BILITOT 0.9  ALBUMIN 4.4    Cardiac Enzymes No results for input(s): TROPONINI, PROBNP in the last 168 hours.  Glucose No results for input(s): GLUCAP in the last 168 hours.  Imaging Dg Chest 2 View  Result Date: 05/14/2017 CLINICAL DATA:  Chest pain EXAM: CHEST  2 VIEW COMPARISON:  None. FINDINGS: The heart size and mediastinal contours are within normal limits. Both lungs are clear. The visualized skeletal structures are unremarkable. IMPRESSION: No active cardiopulmonary disease. Electronically Signed   By: Donavan Foil M.D.   On: 05/14/2017 23:20   Ct Abdomen Pelvis W Contrast  Result Date: 05/14/2017 CLINICAL DATA:  Initial evaluation for acute epigastric pain, nausea. EXAM: CT ABDOMEN AND PELVIS WITH CONTRAST TECHNIQUE: Multidetector CT imaging of the abdomen and pelvis was performed using the standard protocol following bolus administration of intravenous contrast. CONTRAST:  148mL ISOVUE-300 IOPAMIDOL (ISOVUE-300) INJECTION 61% COMPARISON:  None available. FINDINGS: Lower chest: Mild scattered bibasilar atelectatic changes present within the visualized lungs. Visualized lungs are otherwise clear. Hepatobiliary: Subcentimeter hypodensity within the right hepatic lobe noted, too small the characterize, but of doubtful significance. Liver otherwise unremarkable. No portal venous gas. Gallbladder within normal limits. No biliary dilatation. Pancreas: Pancreas within normal limits. Spleen: Spleen within normal limits. Adrenals/Urinary Tract: Adrenal glands are normal. Kidneys equal in size  with symmetric enhancement. 2.2 cm cyst present within the interpolar right kidney. Few additional subcentimeter hypodensities noted, too small the characterize, but statistically likely reflects small cysts as well. Punctate 3 mm nonobstructive stone present within the lower pole the right kidney. No hydronephrosis or focal enhancing renal mass. No hydroureter. Partially distended bladder within normal limits. Stomach/Bowel: Stomach within normal limits. No evidence for bowel obstruction. Appendix normal. No acute inflammatory changes seen about the bowels. No pneumatosis or evidence for bowel ischemia. Vascular/Lymphatic: Normal intravascular enhancement seen within the intra-abdominal aorta. There is irregular filling defect within the superior mesenteric artery, concerning for thrombus (series 2, image 33). Filling defects extend into the distal SMA branches. Thrombus is partially occlusive proximally, not well evaluated distally. Remainder of the major arterial branches extending from the aorta appear patent proximally with no other identifiable thrombus. No pathologically enlarged intra-abdominal or pelvic lymph nodes. Reproductive: Prostate appears to be absent. Other: No free air or fluid. Mildly complex fat containing paraumbilical hernia noted. Musculoskeletal: No acute osseus abnormality. No worrisome lytic or blastic osseous lesions. IMPRESSION: 1. Irregular filling defect within the proximal superior mesenteric artery, concerning for thrombus. Vascular surgery consultation recommended.  Correlation with serum lactate suggested. No CT findings to suggest acute bowel ischemia at this time. 2. No other acute intra-abdominal or pelvic process. 3. 3 mm nonobstructive right renal nephrolithiasis. 4. Sequelae of prior prostatectomy. Critical Value/emergent results were called by telephone at the time of interpretation on 05/14/2017 at 11:42 pm to the physician assistant taking care of the patient Montine Circle , who verbally acknowledged these results. Electronically Signed   By: Jeannine Boga M.D.   On: 05/14/2017 23:44   STUDIES:  CT Abdomen (11/16):  1. Irregular filling defect within the proximal superior mesenteric artery, concerning for thrombus. Vascular surgery consultation recommended. Correlation with serum lactate suggested. No CT findings to suggest acute bowel ischemia at this time. 2. No other acute intra-abdominal or pelvic process. 3. 3 mm nonobstructive right renal nephrolithiasis. 4. Sequelae of prior prostatectomy.  ANTIBIOTICS: ertapenem and cefuroxime 11/17 >>  SIGNIFICANT EVENTS: 11/16 PM: presented to [facility] with abd pain >> appears to be SMA clot per Abdominal CT 11/17 AM: transferred to [facility] and underwent emergent surgery, finding SMA dissection with clot >> surgically repaired, significant intraoperative blood loss >> transferred to ICU post-op to remain intubated  LINES/TUBES: 18G PIV, 22G PIV Left Radial Aline 11/17 >> Foley catheter 11/17 >> ETT 11/17 >>  ASSESSMENT / PLAN: [name] with hx HTN, OSA, Prostate cancer s/p resection, who presented to the [facility] ER on 11/16 PM c/o sudden onset excruciating mid-abdominal pain that began 11/16 at 8pm. CT showed questionable SMA clot. Patient started on heparin and transferred emergently to [facility] where he was brought to the OR and mesenteric angiogram showed it to actually be an SMA dissection, with multiple false lumens and with clot within it. Dr Marcus Pruitt says the OR case was long and complicated with approx 2 LITERS of blood loss. Postop keep intubated.  PULMONARY 1. Acute Hypoxic Respiratory failure: - continue mechanical ventilation - CXR on my review shows ETT in correct position - obtained ABG and made vent changes as needed - continue precedex gtt; start fentanyl gtt; versed IV PRN - start GI prophylaxis; NPO; full dose heparin gtt with no bolus - VAP prevention bundle  2. Hx OSA: - will likely need CPAP  post-extubation  CARDIOVASCULAR 1. Hx HTN: - currently soft BP - goal SBP <120 (currently at goal) - metoprolol IV PRN  2. SMA Dissection and SMA Clot: - s/p surgical repair - BP control as above - Heparin gtt without bolus per Dr Marcus Pruitt - needs workup at some point for possible Marfans or Ehlers danlos as it is unusual to have an SMA dissection without an aortic dissection and in such a young patient, even with chronically uncontrolled HTN.  - on ertapenem and cefuroxime post-op prophylaxis - s/p removal of femoral sheath. - check Hgb and Lactate q6hrs  3. Hemorrhagic shock: - check Hgb q6hrs - wean Phenylephrine as tolerated  RENAL 1. AKI: - Creatinine 1.19 on admit, up from baseline of 0.99; foley catheter in place; monitor UOP closely - Start D5LR gtt.   2. Hypokalemia:  - K:2.9 on admission; unclear if was repleted in the OR as not KCL charted on MAR - recheck potassium now and replete as needed   GASTROINTESTINAL NPO; GI prophylaxis  HEMATOLOGIC 1. Acute surgical blood loss as discussed above 2. Prostate cancer s/p resection  INFECTIOUS No active issues   ENDOCRINE No active issues   NEUROLOGIC No active issues    FAMILY  - Updated patient's wife and another younger male  family member at bedside. They were tearful but expressed understanding of patient's current medical condition.  - Inter-disciplinary family meet or Palliative Care meeting due by: 05/22/17  60 minutes critical care time  Vernie Murders, MD  Pulmonary and [place] Pager: [phone]  05/15/2017, 12:59 PM

## 2017-05-15 NOTE — Progress Notes (Signed)
ANTICOAGULATION CONSULT NOTE - Initial Consult  Pharmacy Consult for IV heparin Indication: superior mesenteric vein thrombus/VTE  No Known Allergies  Patient Measurements: Height: 5\' 6"  (167.6 cm) Weight: 216 lb (98 kg) IBW/kg (Calculated) : 63.8 Heparin Dosing Weight: 74 kg  Vital Signs: BP: 135/76 (11/17 1250) Pulse Rate: 94 (11/17 1250)  Labs: Recent Labs    05/14/17 2126 05/15/17 0031  HGB 15.8  --   HCT 46.7  --   PLT 197  --   APTT  --  23*  LABPROT  --  12.8  INR  --  0.97  CREATININE 1.19  --     Estimated Creatinine Clearance: 85 mL/min (by C-G formula based on SCr of 1.19 mg/dL).   Medical History: Past Medical History:  Diagnosis Date  . Allergy   . Cancer ([place])   . Hearing loss in right ear   . Hypertension   . Knee pain   . Low back pain   . Sleep apnea     Assessment: 46 yoM c/o sudden excruciating mid abdominal pain found to have superior mesenteric artery thrombus.   Heparin reordered post-op this afternoon. No bolus given recent surgery.  Will follow closely for any signs or symptoms of bleeding.   Goal of Therapy:  Heparin level 0.3-0.7 units/ml Monitor platelets by anticoagulation protocol: Yes   Plan:  Restart heparin at 1200 units/hr Check 6 hours heparin level Daily CBC  Erin Hearing PharmD., BCPS Clinical Pharmacist Pager [phone] 05/15/2017 1:36 PM

## 2017-05-15 NOTE — Anesthesia Procedure Notes (Signed)
Procedure Name: Intubation Date/Time: 05/15/2017 1:52 AM Performed by: Purvis Kilts, CRNA Pre-anesthesia Checklist: Patient identified, Emergency Drugs available, Suction available, Timeout performed and Patient being monitored Patient Re-evaluated:Patient Re-evaluated prior to induction Oxygen Delivery Method: Circle system utilized Preoxygenation: Pre-oxygenation with 100% oxygen Induction Type: IV induction Ventilation: Mask ventilation without difficulty Laryngoscope Size: Mac and 4 Grade View: Grade I Tube type: Oral Tube size: 7.5 mm Number of attempts: 1 Airway Equipment and Method: Stylet Placement Confirmation: ETT inserted through vocal cords under direct vision,  positive ETCO2 and breath sounds checked- equal and bilateral Secured at: 22 cm Tube secured with: Tape Dental Injury: Teeth and Oropharynx as per pre-operative assessment

## 2017-05-15 NOTE — Progress Notes (Signed)
   Daily Progress Note  I have reviewed this CT abd/pelvis.  I agree that there is concern for a SMA embolus vs dissection.  Pt needs immediate mesenteric angiogram and immediate embolectomy if angiogram is confirmatory.  - Transfer immediately to [facility] ED.  If repeated to the PA, transfer fastest method possible. - If exam is consistent with Acute mesenteric ischemia, will proceed immediately with on table mesenteric angiogram and possible SMA embolectomy in hybrid OR.   Adele Barthel, MD, FACS [facility] Office: [phone] Pager: [phone]  05/15/2017, 12:41 AM

## 2017-05-15 NOTE — Progress Notes (Signed)
[place] for IV heparin Indication: superior mesenteric vein thrombus/VTE  No Known Allergies  Patient Measurements: Height: 5\' 6"  (167.6 cm) Weight: 216 lb (98 kg) IBW/kg (Calculated) : 63.8 Heparin Dosing Weight: 74 kg  Vital Signs: Temp: 99.9 F (37.7 C) (11/17 1530) Temp Source: Oral (11/17 1530) BP: 113/68 (11/17 1900) Pulse Rate: 82 (11/17 2000)  Labs: Recent Labs    05/14/17 2126 05/15/17 0031 05/15/17 1230 05/15/17 1950  HGB 15.8  --  11.6*  --   HCT 46.7  --  34.7*  --   PLT 197  --  149*  --   APTT  --  23* 28  --   LABPROT  --  12.8 15.0  --   INR  --  0.97 1.19  --   HEPARINUNFRC  --   --   --  0.34  CREATININE 1.19  --  1.16  --     Estimated Creatinine Clearance: 87.2 mL/min (by C-G formula based on SCr of 1.16 mg/dL).  Assessment: 30 yoM c/o sudden excruciating mid abdominal pain found to have superior mesenteric artery thrombus. Heparin reordered post-op. No bolus given recent surgery. Will follow closely for any signs or symptoms of bleeding. Heparin level is therapeutic.   Goal of Therapy:  Heparin level 0.3-0.7 units/ml Monitor platelets by anticoagulation protocol: Yes   Plan:  Continue heparin gtt 1200 units/hr Daily heparin level and CBC  Salome Arnt, PharmD, BCPS 05/15/2017 8:35 PM

## 2017-05-15 NOTE — ED Notes (Signed)
To or 

## 2017-05-15 NOTE — ED Notes (Signed)
PAGED CHEN ABOUT ARRIVAL

## 2017-05-15 NOTE — Progress Notes (Signed)
ANTICOAGULATION CONSULT NOTE - Initial Consult  Pharmacy Consult for IV heparin Indication: superior mesenteric vein thrombosis  No Known Allergies  Patient Measurements: Height: 5\' 6"  (167.6 cm) Weight: 216 lb (98 kg) IBW/kg (Calculated) : 63.8 Heparin Dosing Weight: 74 kg  Vital Signs: Temp: 97.9 F (36.6 C) (11/16 2121) Temp Source: Oral (11/16 2121) BP: 134/91 (11/16 2200) Pulse Rate: 70 (11/16 2200)  Labs: Recent Labs    05/14/17 2126  HGB 15.8  HCT 46.7  PLT 197  CREATININE 1.19    Estimated Creatinine Clearance: 85 mL/min (by C-G formula based on SCr of 1.19 mg/dL).   Medical History: Past Medical History:  Diagnosis Date  . Allergy   . Cancer ([place])   . Hearing loss in right ear   . Hypertension   . Knee pain   . Low back pain   . Sleep apnea     Medications:  Scheduled:  . heparin  4,000 Units Intravenous Once  . iopamidol       Infusions:  . heparin      Assessment: 21 yoM c/o sudden excruciating mid abdominal pain found to have superior mesenteric artery thrombus. IV heparin per Rx.  Goal of Therapy:  Heparin level 0.3-0.7 units/ml Monitor platelets by anticoagulation protocol: Yes   Plan:  Baseline coags STAT Heparin 4000 unit bolus x1  Start drip at 1300 units/hr Daily CBC/HL Check 1st HL in 6 hours  Lawana Pai R 05/15/2017,12:40 AM

## 2017-05-15 NOTE — ED Notes (Signed)
[facility] here to transport pt to [facility]. 

## 2017-05-15 NOTE — ED Provider Notes (Signed)
1:00 AM  Pt sent as a transfer from [facility] with concerns for SMA occlusion.  No sign of bowel ischemia, perforation on CT scan.  Dr. Bridgett Larsson with vascular surgery consulted and interventional radiology consulted as well.  Patient on heparin.  Patient states his pain is 1/10.  Abdominal exam benign.  Vascular surgery will be made aware the patient is here.  He is NPO.  I reviewed all nursing notes, vitals, pertinent previous records, EKGs, lab and urine results, imaging (as available).    Deriana Vanderhoef, Delice Bison, DO 05/15/17 [phone]

## 2017-05-15 NOTE — Anesthesia Postprocedure Evaluation (Signed)
Anesthesia Post Note  Patient: Marcus Pruitt  Procedure(s) Performed: THROMBECTOMY OF SUPERIOR MESENTERIC ARTERY (N/A ) SUPERIOR MESENTERIC ARTERY PATCH ANGIOPLASTY USING PERI-GUARD PATCH (N/A ) OPEN FENESTRATION SUPERIOR MESENTERIC ARTERY (N/A ) MESENTERIC AORTOGRAM (N/A )     Patient location during evaluation: SICU Anesthesia Type: General Level of consciousness: patient remains intubated per anesthesia plan and sedated Vital Signs Assessment: post-procedure vital signs reviewed and stable Respiratory status: patient on ventilator - see flowsheet for VS and patient remains intubated per anesthesia plan Cardiovascular status: blood pressure returned to baseline Anesthetic complications: no    Last Vitals:  Vitals:   05/15/17 1530 05/15/17 1607  BP: (!) 97/58   Pulse: 88   Resp: 16   Temp: 37.7 C   SpO2: 100% 100%    Last Pain:  Vitals:   05/15/17 1530  TempSrc: Oral  PainSc:                  [name]

## 2017-05-15 NOTE — ED Provider Notes (Signed)
Pt seen and evaluated. Otherwise healthy 46 y/o male. History of prostate cancer s/p resection--in remission.  Normal state of health until 8pm tonight at work. Sudden excruciating mid abdominal pain. CT shows SMA occlusion.  Pt in NSR. No murmur. No pulmonary c/o. Not acidotic, Lactate pending. Heparin ordered. Pain control dififcult--out of proportion to exam. No peritoneal irritation.  Non smoker. No personal risks for hypercoagulable state. No FH of hypercoagulable state.   Vascular surgery consult placed upon phone call from radiologist.    Tanna Furry, MD 05/15/17 0020

## 2017-05-15 NOTE — Transfer of Care (Signed)
Immediate Anesthesia Transfer of Care Note  Patient: Marcus Pruitt  Procedure(s) Performed: THROMBECTOMY OF SUPERIOR MESENTERIC ARTERY (N/A ) SUPERIOR MESENTERIC ARTERY PATCH ANGIOPLASTY USING PERI-GUARD PATCH (N/A ) OPEN FENESTRATION SUPERIOR MESENTERIC ARTERY (N/A ) MESENTERIC AORTOGRAM (N/A )  Patient Location: PACU  Anesthesia Type:General  Level of Consciousness: sedated, unresponsive and Patient remains intubated per anesthesia plan  Airway & Oxygen Therapy: Patient remains intubated per anesthesia plan  Post-op Assessment: Post -op Vital signs reviewed and stable  Post vital signs: Reviewed  Last Vitals: 134/75, 85,vent 100% Vitals:   05/15/17 0107 05/15/17 1224  BP: (!) 153/95   Pulse: 78   Resp: 20   Temp: 36.6 C   SpO2: 99% 100%    Last Pain:  Vitals:   05/15/17 0107  TempSrc: Oral  PainSc:          Complications: No apparent anesthesia complications

## 2017-05-15 NOTE — Op Note (Addendum)
OPERATIVE NOTE   PROCEDURE: 1. Right common femoral artery cannulation under ultrasound guidance 2. Placement of catheter in aorta 3. Aortogram 4. Thrombectomy of superior mesenteric artery 5. 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 Bovine patch angioplasty superior mesenteric artery  7. Selection of superior mesenteric artery  8. 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 Gae Gallop; Gerri Lins, [facility]   ANESTHESIA: general  ESTIMATED BLOOD LOSS: 2300 cc  CONTRAST: 160 cc  BLOOD: 1 uPRBC  UOP: 800 cc  FINDING(S): 1.  Ectatic superior mesenteric artery (12-14 mm) with external evidence of dissection 2.  On A-P aortogram: possible occlusion of SMA shortly after take off of SMA 3.  On Lateral Aortogram: compromised flow in distal 1/2 of superior mesenteric artery on initial aortogram with thrombus vs dissection flap in proximal segment of superior mesenteric artery  4.  Patent celiac artery 5.  Viable bowel throughout 6.  Four fenestrations of the superior mesenteric artery with obvious thrombus in false lumen 7.  Multiple phasic signals in superior mesenteric artery after resection dissection flap and bovine patch angioplasty 8.  Some distal stenosis >50% in superior mesenteric artery past the first order bifurcation in superior mesenteric artery: waveforms not consistent with such  SPECIMEN(S):  none  INDICATIONS:   Marcus Pruitt is a 46 y.o. male who presents with symptoms consistent with acute mesenteric ischemia.  CT abd/pelvis appeared to consistent with embolism to superior mesenteric artery.  I recommended:  mesenteric angiogram, possible SMA embolectomy, possible bowel  resection, and other indicated procedures in hybrid OR.  If mesenteric angiogram confirms SMA embolus, will proceed with SMA embolectomy, bowel resection and other indicated procedures.  The patient is aware the risks of this procedure include but are not limited to: bleeding, infection, need for transfusion, infection, death, stroke, wound complications, bowel injuries, bowel ischemia, extended ventilation, incomplete evacuation of mesenteric embolus from mesenteric branches and future ventral hernias.     DESCRIPTION: After obtaining full informed written consent, the patient was brought back to the operating room and placed supine upon the operating table.  The patient received IV antibiotics prior to induction.  A procedure time out was completed and the correct surgical site was verified.  After obtaining adequate anesthesia, the patient was prepped and draped in the standard fashion for: open abdominal exploration.    First I turned my attention to the right groin.  Under ultrasound guidance, the subcutaneous tissue surrounding the right common femoral artery was anesthesized with 1% lidocaine with epinephrine.  The artery was then cannulated with a micropuncture needle.  The microwire was advanced into the iliac arterial system.  The needle was exchanged for a microsheath, which was loaded into the common femoral artery over the wire.  The microwire was exchanged for a Bentson wire which was advanced into the aorta.  The microsheath was then exchanged for a 5-Fr sheath which was loaded into the common femoral artery.  I loaded an Omniflush catheter over the wire up to the level of L1 and connected the catheter to the power injector after removing the wire.  I performed an anteroposterior aortogram.  This demonstrated possible occlusion of SMA shortly after takeoff with rapid reconstitution of jejunal branches.  I then did a lateral aortogram x 2, positioning the catheter  adjacent to the SMA orifice on  the second injection.  This demonstrated an abnormal flow lumen in the proximal SMA just past the genu in the artery.  The distal 1/2 of the SMA also appeared to have compromised flow.  I had concerns this was either a thrombus or dissection, regardless exploration of the SMA was indicated.  I replaced the wire into the catheter, straightening out the crook in the catheter.  Both were removed from the sheath together.  The sheath was aspirated.  No clots were present and the sheath was reloaded with heparinized saline.    At this point, I started the exploratory lapartomy.  I made a midline incision from the xiphoid process to mid-way between the umbilicus and pubic bone.  I dissected down to fascia with electrocautery.  I could tell there had been prior repair of an umbilical hernia without resection of the hernia sac, as there remained much of the sac.  I open the fascia in the mid-line and bluntly entered into the peritoneum.  I opened the rest of the fascia in the mid-line under direct visualization with electrocautery.  There was a considerable amount of adhesions of omentum near the prior umbilical hernia repair.  These were carefully taken down with electrocautery.  I had to transect several prolene stitches to gain full access to the abdomen.  I placed wet lap pads along the abdominal walls then placed the Balfour retractor to get lateral exposure.  I then set up the Omnitract retractor system.    I explored the intestines starting at the ligament of Treitz and ran the entire bowel.  There was no obvious ischemia despite the SMA findings.  The liver, gallbladder, and stomach appeared to be viable.  I pulled up the transverse colon and covered it with a wet towel.  I then exsanguinated the small intestines into a wet towel.  I retract the intestines gently out of the way.  I then gently retract the transverse colon superiorly.  This allowed me access to the transverse mesocolon.  I scored the transverse  mesocolon and then slowly dissected through the mesentery, toward the aorta.  I dissected out what I though was the middle colic artery given the large caliber.  This turned out to be a component of the arterial arcade.  The accompanying vein drained into the SMV.  I followed the SMV toward the pancreas.  I could identify the head and body of the pancreas and was looking for the superior mesenteric artery inferiorly but could not easily identify such.  The pancreas directed me to dissect down to a structure that I thought was the aorta, given the size 12-14 mm.  This turned out to the the superior mesenteric artery.  With great difficulty I dissected out the proximal superior mesenteric artery just past the genu, the distal extent down to the first order branches,  and multiple jejunal branches.  Multiple of these side branches were extremely fragile and friable.  Two jejunal branches had to be ligated due to inability to control bleeding.  This did not impact the viability of the bowel.  The external appearance of this SMA was concerning for a dissection.  Although there was a pulse in the artery, there were segments of the proximal SMA that had high resistive waveforms on continuous doppler, consistent with the angiogram.  Given the compromised flow of the distal SMA on angiogram, I felt exploring the SMA was going to be necessary, as there was no  was to predict when the SMA would totally occlude.  The patient was given 12000 units of Heparin intravenously, which was a therapeutic bolus. An additional 2000 units of Heparin was administered every hour after initial bolus to maintain anticoagulation.  In total, 20000 units of Heparin was administrated to achieve and maintain a therapeutic level of anticoagulation.  I placed all the the jejunal branches and the distal first order branches under tension.  I clamped the SMA proximally with a Cooley clamp.  I made an arteriotomy in the proximal 1/3 of the SMA.   Immediately thrombus was evident, I extended the arteriotomy proximally to just distal to the genu of the SMA and then distally until just proximal to the branching into the first order vessels.  There was multiple fenestrations in a dissection flap.  I counted 3 obvious fenestrations with pockets of thrombus present in false lumens.  I did not think I could reconstruct the true lumen, so I elected to resect the dissection flap.  I appeared this was mainly intima, as the residual wall appeared to be robust, suggesting residual media and adventitia.  I removed the dissection flap from just distal to the genu all the way down to the just proximal to the first order branches.  The lumen in the most proximal SMA appeared intact with adherent intima, suggest the first entry tear was at the level of the genu.  There was residual true lumen evident just proximal to the first order bifurcation.  I tacked this true lumen into place with the media and adventitia with interrupted 7-0 Prolene stitches.  I then released the vessel loops on the first order branches.  There was absolutely no backbleeding on 2 out of 3, so I had concerns with possible distal embolization.  I passed a 3 Fogarty distally and pulled out a plug of clot along with some dissection flap.  This also caused return of vigorous backbleeding in the primary branch.  I passed the Fogarty down the other branch and got no backbleeding or clot.  At this point, I had concerns there might be distal propagation of the dissection flap, but technically it was no longer possible to extend the arteriotomy without possibility destroying the artery given the small distal size.  I then fashioned a bovine pericardial patch for this SMA.  I sewed this patch into place with two running stitches of 6-0 Prolene.  Prior to completion, I bled all arteries.  There was excellent pulsatile SMA bleeding.  I completed the patch angioplasty in the usual fashion.  I released all vessel  loops and unclamped the proximal SMA.  Immediately, there was bleeding from the toe of the suture line.  This was repaired with 6-0 Prolene stitches.  I then sequentially repaired some a couple of jejunal branches with 6-0 stitches.  There was a small 1-2 mm branch that was nearly avulsed that had to be controlled with clips.  There was a strong pulse in this SMA at this point.  Waveforms in the SMA were atypical, so I felt completion angiography was necessary.    I removed all the retractors and then briefly looked at the bowel.  All segments appeared to be viable.  I replaced the bowel in the anatomic position and then placed a wet sterile towel over the abdomen.  I replaced the Bentson wire via the right sheath into the aorta.  The Omniflush catheter was replaced in the L1 position.  The catheter was connected to the  power injector circuit.  A repeat anteroposterior aortogram was completed.  This demonstrated complete opacification of the SMA with a possible mid-segment stenosis.  I felt selective angiogram of the SMA was necessary.  Two lateral aortograms were obtained to visualized the SMA take off, I selected the SMA with a BER-2 catheter and advanced the catheter into the genu of SMA.  A selective mesenteric angiogram was completed.  This demonstrated a patent SMA with >50% stenosis pass the extent of the SMA dissection.  The appearance was suggestive of either residual non-flow limiting dissection of spasm.  Regardless, it was past the extent of my dissection and I felt further dissection was likely to damage the artery possibly to the point of non-salvage.  The patient had also been on the table >6 hours, was hypothermic and was requiring some vasopressor support, so I felt it was no longer safe to extend further the operation.    I replaced the wire into the catheter, straightening out the crook in the catheter.  Both were removed from the sheath together.  The sheath was aspirated.  No clots were  present and the sheath was reloaded with heparinized saline.  I replaced all the retractor and resumed my prior exposure.  At this point, no further active bleeding was was present in the surgical field.  I gave the patient 60 mg of Protamine.  I reinterrogated the SMA with continuous doppler and now there were multiphasic signals in the SMA throughout.  There was no longer a high resistive waveform in the SMA even at the distal extent of this dissection.  I washed out the abdominal and then removed the retractors.  I reinspected the bowel and no ischemia was evident throughout.  I replaced the small intestine and colon into anatomic position and redraped the omentum into place.  The fascia was reapproximated with two double stranded 1 PDS sutures, running from each end of the incision.  The suture line was tighten up at the end of the process and the two sutures tied together.  I secure the tied end to the fascia with a 3-0 Vicryl.  The skin was then stapled back together.  A Coverall dressing was applied to the suture line.    The right sheath was then aspirated and flushed with heparinized saline.  Pressure was applied to the right groin for 20 minutes.  A sterile bandage was applied to the femoral cannulation site.  The patient was left intubated due to extended ventilation, history of sleep apnea, and anticipated vigorous resuscitation needed in the ICU.  The patient will be extubated tomorrow once hemodynamically stable and able to take over ventilation.   COMPLICATIONS: none  CONDITION: guarded   Adele Barthel, MD, FACS [facility] Office: [phone] Pager: [phone]  05/15/2017, 11:03 AM

## 2017-05-15 NOTE — H&P (View-Only) (Signed)
Requested by:  Tanna Furry, MD [facility] )  Reason for consultation: possible mesenteric ischemia    History of Present Illness   Marcus Pruitt is a 46 y.o. (1970-09-09) male who presents with cc: abdominal pain.  Reported pt presented initially with pain out of proportion to examination.   CT abd/pelvis was ordered due for evaluation of abdominal pain.  Pt noted onset pain this PM, reportedly around 8 PM.  +N/-emesis.  No hematochezia, melena or hematemesis.  Pt denies any cardiac arrhythmia.  Pt's abd pain has abated somewhat with narcotic administration.  Past Medical History:  Diagnosis Date  . Allergy   . Cancer ([place])   . Hearing loss in right ear   . Hypertension   . Knee pain   . Low back pain   . Sleep apnea   Prostate cancer   Past Surgical History:  Procedure Laterality Date  . LAPAROSCOPIC INGUINAL HERNIA Left 02/21/2016   Performed by Alexis Frock, MD at [facility] ORS  . LAPAROSCOPIC UMBILICAL HERNIA  2/62/0355   Performed by Alexis Frock, MD at [facility] ORS  . MANDIBLE SURGERY  1990  . PELVIC LYMPHADENECTOMY Bilateral 02/21/2016   Performed by Alexis Frock, MD at [facility] ORS  . XI ROBOTIC ASSISTED LAPAROSCOPIC RADICAL PROSTATECTOMY N/A 02/21/2016   Performed by Alexis Frock, MD at [facility] ORS     Social History   Socioeconomic History  . Marital status: Married    Spouse name: Not on file  . Number of children: Not on file  . Years of education: Not on file  . Highest education level: Not on file  Social Needs  . Financial resource strain: Not on file  . Food insecurity - worry: Not on file  . Food insecurity - inability: Not on file  . Transportation needs - medical: Not on file  . Transportation needs - non-medical: Not on file  Occupational History  . Not on file  Tobacco Use  . Smoking status: Former Smoker    Last attempt to quit: 01/28/2007    Years since quitting: 10.3  . Smokeless tobacco: Never Used  Substance and Sexual Activity  . Alcohol use: Yes   Alcohol/week: 0.0 oz    Comment: 2 glasses liquor week  . Drug use: No  . Sexual activity: Not on file  Other Topics Concern  . Not on file  Social History Narrative  . Not on file    Family History  Problem Relation Age of Onset  . Heart disease Maternal Grandmother   . Diabetes Unknown        fhx  . Hypertension Unknown        fhx  . Cancer Unknown        fhx/prostate  . Prostate cancer Father     Current Facility-Administered Medications  Medication Dose Route Frequency Provider Last Rate Last Dose  . fentaNYL (SUBLIMAZE) injection 50 mcg  50 mcg Intravenous Q30 min PRN Tanna Furry, MD   50 mcg at 05/14/17 2134  . heparin ADULT infusion 100 units/mL (25000 units/246mL sodium chloride 0.45%)  1,300 Units/hr Intravenous Continuous Dorrene German, [place] 13 mL/hr at 05/15/17 0103 1,300 Units/hr at 05/15/17 0103   Current Outpatient Medications  Medication Sig Dispense Refill  . lisinopril-hydrochlorothiazide (PRINZIDE,ZESTORETIC) 10-12.5 MG tablet Take 1 tablet by mouth daily. 90 tablet 3  . benzonatate (TESSALON) 100 MG capsule Take 1 capsule (100 mg total) by mouth 2 (two) times daily as needed for cough. (Patient not taking: Reported  on 05/14/2017) 20 capsule 0  . cyclobenzaprine (FLEXERIL) 5 MG tablet Take 1 tablet (5 mg total) by mouth 3 (three) times daily as needed for muscle spasms. (Patient not taking: Reported on 05/14/2017) 30 tablet 0  . predniSONE (DELTASONE) 10 MG tablet Take 4 tablets once daily for 2 days, 3 tabs daily for 2 days, 2 tabs daily for 2 days, 1 tab daily for 2 days. (Patient not taking: Reported on 05/14/2017) 200 tablet 0    No Known Allergies  REVIEW OF SYSTEMS (negative unless checked):   Cardiac:  []  Chest pain or chest pressure? []  Shortness of breath upon activity? []  Shortness of breath when lying flat? []  Irregular heart rhythm?  Vascular:  []  Pain in calf, thigh, or hip brought on by walking? []  Pain in feet at night that wakes you  up from your sleep? []  Blood clot in your veins? []  Leg swelling?  Pulmonary:  []  Oxygen at home? []  Productive cough? []  Wheezing?  Neurologic:  []  Sudden weakness in arms or legs? []  Sudden numbness in arms or legs? []  Sudden onset of difficult speaking or slurred speech? []  Temporary loss of vision in one eye? []  Problems with dizziness?  Gastrointestinal:  []  Blood in stool? []  Vomited blood?  Genitourinary:  []  Burning when urinating? []  Blood in urine? [x]  Prostatectomy for prostate cancer  Psychiatric:  []  Major depression  Hematologic:  []  Bleeding problems? []  Problems with blood clotting?  Dermatologic:  []  Rashes or ulcers?  Constitutional:  []  Fever or chills?  Ear/Nose/Throat:  []  Change in hearing? []  Nose bleeds? []  Sore throat?  Musculoskeletal:  []  Back pain? []  Joint pain? []  Muscle pain?   Physical Examination     Vitals:   05/14/17 2121 05/14/17 2200 05/15/17 0107  BP: (!) 146/119 (!) 134/91 (!) 153/95  Pulse: 80 70 78  Resp: (!) 22  20  Temp: 97.9 F (36.6 C)  97.9 F (36.6 C)  TempSrc: Oral  Oral  SpO2: 100%  99%  Weight: 216 lb (98 kg)    Height: 5\' 6"  (1.676 m)     Body mass index is 34.86 kg/m.  General Alert, O x 3, WD, NAD  [place]/AT,    Ear/Nose/ Throat Hearing grossly intact, nares without erythema or drainage, oropharynx without Erythema or Exudate, Mallampati score: 3,   Eyes PERRLA, EOMI,    Neck Supple, mid-line trachea,    Pulmonary Sym exp, good B air movt, CTA B  Cardiac RRR, Nl S1, S2, no Murmurs, No rubs, No S3,S4  Vascular Vessel Right Left  Radial Palpable Palpable  Brachial Palpable Palpable  Carotid Palpable, No Bruit Palpable, No Bruit  Aorta Not palpable N/A  Femoral Palpable Palpable  Popliteal Not palpable Not palpable  PT Palpable Palpable  DP Palpable Palpable    Gastro- intestinal soft, non-distended, TTP in RLQ quadrant, No guarding or rebound, no HSM, no masses, no CVAT B, No  palpable prominent aortic pulse,    Musculo- skeletal M/S 5/5 throughout  , Extremities without ischemic changes  , No edema present, No visible varicosities , No Lipodermatosclerosis present  Neurologic Cranial nerves 2-12 intact , Pain and light touch intact in extremities , Motor exam as listed above  Psychiatric Judgement intact, Mood & affect appropriate for pt's clinical situation  Dermatologic See M/S exam for extremity exam, No rashes otherwise noted  Lymphatic  Palpable lymph nodes: None    Radiology     Dg Chest 2 View  Result Date: 05/14/2017 CLINICAL DATA:  Chest pain EXAM: CHEST  2 VIEW COMPARISON:  None. FINDINGS: The heart size and mediastinal contours are within normal limits. Both lungs are clear. The visualized skeletal structures are unremarkable. IMPRESSION: No active cardiopulmonary disease. Electronically Signed   By: Donavan Foil M.D.   On: 05/14/2017 23:20   Ct Abdomen Pelvis W Contrast  Result Date: 05/14/2017 CLINICAL DATA:  Initial evaluation for acute epigastric pain, nausea. EXAM: CT ABDOMEN AND PELVIS WITH CONTRAST TECHNIQUE: Multidetector CT imaging of the abdomen and pelvis was performed using the standard protocol following bolus administration of intravenous contrast. CONTRAST:  122mL ISOVUE-300 IOPAMIDOL (ISOVUE-300) INJECTION 61% COMPARISON:  None available. FINDINGS: Lower chest: Mild scattered bibasilar atelectatic changes present within the visualized lungs. Visualized lungs are otherwise clear. Hepatobiliary: Subcentimeter hypodensity within the right hepatic lobe noted, too small the characterize, but of doubtful significance. Liver otherwise unremarkable. No portal venous gas. Gallbladder within normal limits. No biliary dilatation. Pancreas: Pancreas within normal limits. Spleen: Spleen within normal limits. Adrenals/Urinary Tract: Adrenal glands are normal. Kidneys equal in size with symmetric enhancement. 2.2 cm cyst present within the interpolar  right kidney. Few additional subcentimeter hypodensities noted, too small the characterize, but statistically likely reflects small cysts as well. Punctate 3 mm nonobstructive stone present within the lower pole the right kidney. No hydronephrosis or focal enhancing renal mass. No hydroureter. Partially distended bladder within normal limits. Stomach/Bowel: Stomach within normal limits. No evidence for bowel obstruction. Appendix normal. No acute inflammatory changes seen about the bowels. No pneumatosis or evidence for bowel ischemia. Vascular/Lymphatic: Normal intravascular enhancement seen within the intra-abdominal aorta. There is irregular filling defect within the superior mesenteric artery, concerning for thrombus (series 2, image 33). Filling defects extend into the distal SMA branches. Thrombus is partially occlusive proximally, not well evaluated distally. Remainder of the major arterial branches extending from the aorta appear patent proximally with no other identifiable thrombus. No pathologically enlarged intra-abdominal or pelvic lymph nodes. Reproductive: Prostate appears to be absent. Other: No free air or fluid. Mildly complex fat containing paraumbilical hernia noted. Musculoskeletal: No acute osseus abnormality. No worrisome lytic or blastic osseous lesions. IMPRESSION: 1. Irregular filling defect within the proximal superior mesenteric artery, concerning for thrombus. Vascular surgery consultation recommended. Correlation with serum lactate suggested. No CT findings to suggest acute bowel ischemia at this time. 2. No other acute intra-abdominal or pelvic process. 3. 3 mm nonobstructive right renal nephrolithiasis. 4. Sequelae of prior prostatectomy. Critical Value/emergent results were called by telephone at the time of interpretation on 05/14/2017 at 11:42 pm to the physician assistant taking care of the patient Montine Circle , who verbally acknowledged these results. Electronically Signed    By: Jeannine Boga M.D.   On: 05/14/2017 23:44   I reviewed this patient's CT abd/pelvis, I agree that the CT is concerning for SMA embolism.   Laboratory   CBC CBC Latest Ref Rng & Units 05/14/2017 02/22/2016 02/21/2016  WBC 4.0 - 10.5 K/uL 5.8 - -  Hemoglobin 13.0 - 17.0 g/dL 15.8 12.6(L) 14.7  Hematocrit 39.0 - 52.0 % 46.7 37.7(L) 42.8  Platelets 150 - 400 K/uL 197 - -    BMP BMP Latest Ref Rng & Units 05/14/2017 02/22/2016 02/12/2016  Glucose 65 - 99 mg/dL 105(H) 123(H) 100(H)  BUN 6 - 20 mg/dL 12 10 11   Creatinine 0.61 - 1.24 mg/dL 1.19 1.20 0.99  Sodium 135 - 145 mmol/L 136 133(L) 138  Potassium 3.5 - 5.1 mmol/L 2.9(L) 4.1  4.3  Chloride 101 - 111 mmol/L 99(L) 101 105  CO2 22 - 32 mmol/L 24 26 25   Calcium 8.9 - 10.3 mg/dL 9.8 8.6(L) 9.1    Coagulation Lab Results  Component Value Date   INR 0.97 05/15/2017   No results found for: PTT  Lipids    Component Value Date/Time   CHOL 205 (H) 03/11/2015 0904   TRIG 209.0 (H) 03/11/2015 0904   HDL 47.00 03/11/2015 0904   CHOLHDL 4 03/11/2015 0904   VLDL 41.8 (H) 03/11/2015 0904   [place] 95 01/21/2011 1534   LDLDIRECT 118.0 03/11/2015 5361      Medical Decision Making   Marcus Pruitt is a 46 y.o. male who presents with: possible acute mesenteric ischemia   Based on the patient's vascular studies and examination, I have offered the patient: mesenteric angiogram, possible SMA embolectomy, possible bowel resection, and other indicated procedures in hybrid OR.  If mesenteric angiogram confirms SMA embolus, will proceed with SMA embolectomy, bowel resection and other indicated procedures.  We discussed the possibility of need for placement of temporary abdominal VAC placement for delayed second look at the intestines in the event bowel ischemia is found upon laparatomy.  The patient is aware the risks of this procedure include but are not limited to: bleeding, infection, need for transfusion, infection, death, stroke,  wound complications, bowel injuries, bowel ischemia, extended ventilation, incomplete evacuation of mesenteric embolus from mesenteric branches and future ventral hernias.    The patient is aware of the risks and has elected to proceed.   Adele Barthel, MD, FACS [facility] Office: [phone] Pager: [phone]  05/15/2017, 1:31 AM

## 2017-05-15 NOTE — Consult Note (Signed)
Requested by:  Tanna Furry, MD [facility] )  Reason for consultation: possible mesenteric ischemia    History of Present Illness   Marcus Pruitt is a 46 y.o. (December 26, 1970) male who presents with cc: abdominal pain.  Reported pt presented initially with pain out of proportion to examination.   CT abd/pelvis was ordered due for evaluation of abdominal pain.  Pt noted onset pain this PM, reportedly around 8 PM.  +N/-emesis.  No hematochezia, melena or hematemesis.  Pt denies any cardiac arrhythmia.  Pt's abd pain has abated somewhat with narcotic administration.  Past Medical History:  Diagnosis Date  . Allergy   . Cancer ([place])   . Hearing loss in right ear   . Hypertension   . Knee pain   . Low back pain   . Sleep apnea   Prostate cancer   Past Surgical History:  Procedure Laterality Date  . LAPAROSCOPIC INGUINAL HERNIA Left 02/21/2016   Performed by Alexis Frock, MD at [facility] ORS  . LAPAROSCOPIC UMBILICAL HERNIA  0/93/2671   Performed by Alexis Frock, MD at [facility] ORS  . MANDIBLE SURGERY  1990  . PELVIC LYMPHADENECTOMY Bilateral 02/21/2016   Performed by Alexis Frock, MD at [facility] ORS  . XI ROBOTIC ASSISTED LAPAROSCOPIC RADICAL PROSTATECTOMY N/A 02/21/2016   Performed by Alexis Frock, MD at [facility] ORS     Social History   Socioeconomic History  . Marital status: Married    Spouse name: Not on file  . Number of children: Not on file  . Years of education: Not on file  . Highest education level: Not on file  Social Needs  . Financial resource strain: Not on file  . Food insecurity - worry: Not on file  . Food insecurity - inability: Not on file  . Transportation needs - medical: Not on file  . Transportation needs - non-medical: Not on file  Occupational History  . Not on file  Tobacco Use  . Smoking status: Former Smoker    Last attempt to quit: 01/28/2007    Years since quitting: 10.3  . Smokeless tobacco: Never Used  Substance and Sexual Activity  . Alcohol use: Yes   Alcohol/week: 0.0 oz    Comment: 2 glasses liquor week  . Drug use: No  . Sexual activity: Not on file  Other Topics Concern  . Not on file  Social History Narrative  . Not on file    Family History  Problem Relation Age of Onset  . Heart disease Maternal Grandmother   . Diabetes Unknown        fhx  . Hypertension Unknown        fhx  . Cancer Unknown        fhx/prostate  . Prostate cancer Father     Current Facility-Administered Medications  Medication Dose Route Frequency Provider Last Rate Last Dose  . fentaNYL (SUBLIMAZE) injection 50 mcg  50 mcg Intravenous Q30 min PRN Tanna Furry, MD   50 mcg at 05/14/17 2134  . heparin ADULT infusion 100 units/mL (25000 units/241mL sodium chloride 0.45%)  1,300 Units/hr Intravenous Continuous Dorrene German, [facility] 13 mL/hr at 05/15/17 0103 1,300 Units/hr at 05/15/17 0103   Current Outpatient Medications  Medication Sig Dispense Refill  . lisinopril-hydrochlorothiazide (PRINZIDE,ZESTORETIC) 10-12.5 MG tablet Take 1 tablet by mouth daily. 90 tablet 3  . benzonatate (TESSALON) 100 MG capsule Take 1 capsule (100 mg total) by mouth 2 (two) times daily as needed for cough. (Patient not taking: Reported  on 05/14/2017) 20 capsule 0  . cyclobenzaprine (FLEXERIL) 5 MG tablet Take 1 tablet (5 mg total) by mouth 3 (three) times daily as needed for muscle spasms. (Patient not taking: Reported on 05/14/2017) 30 tablet 0  . predniSONE (DELTASONE) 10 MG tablet Take 4 tablets once daily for 2 days, 3 tabs daily for 2 days, 2 tabs daily for 2 days, 1 tab daily for 2 days. (Patient not taking: Reported on 05/14/2017) 200 tablet 0    No Known Allergies  REVIEW OF SYSTEMS (negative unless checked):   Cardiac:  []  Chest pain or chest pressure? []  Shortness of breath upon activity? []  Shortness of breath when lying flat? []  Irregular heart rhythm?  Vascular:  []  Pain in calf, thigh, or hip brought on by walking? []  Pain in feet at night that wakes you  up from your sleep? []  Blood clot in your veins? []  Leg swelling?  Pulmonary:  []  Oxygen at home? []  Productive cough? []  Wheezing?  Neurologic:  []  Sudden weakness in arms or legs? []  Sudden numbness in arms or legs? []  Sudden onset of difficult speaking or slurred speech? []  Temporary loss of vision in one eye? []  Problems with dizziness?  Gastrointestinal:  []  Blood in stool? []  Vomited blood?  Genitourinary:  []  Burning when urinating? []  Blood in urine? [x]  Prostatectomy for prostate cancer  Psychiatric:  []  Major depression  Hematologic:  []  Bleeding problems? []  Problems with blood clotting?  Dermatologic:  []  Rashes or ulcers?  Constitutional:  []  Fever or chills?  Ear/Nose/Throat:  []  Change in hearing? []  Nose bleeds? []  Sore throat?  Musculoskeletal:  []  Back pain? []  Joint pain? []  Muscle pain?   Physical Examination     Vitals:   05/14/17 2121 05/14/17 2200 05/15/17 0107  BP: (!) 146/119 (!) 134/91 (!) 153/95  Pulse: 80 70 78  Resp: (!) 22  20  Temp: 97.9 F (36.6 C)  97.9 F (36.6 C)  TempSrc: Oral  Oral  SpO2: 100%  99%  Weight: 216 lb (98 kg)    Height: 5\' 6"  (1.676 m)     Body mass index is 34.86 kg/m.  General Alert, O x 3, WD, NAD  Head [place]/AT,    Ear/Nose/ Throat Hearing grossly intact, nares without erythema or drainage, oropharynx without Erythema or Exudate, Mallampati score: 3,   Eyes PERRLA, EOMI,    Neck Supple, mid-line trachea,    Pulmonary Sym exp, good B air movt, CTA B  Cardiac RRR, Nl S1, S2, no Murmurs, No rubs, No S3,S4  Vascular Vessel Right Left  Radial Palpable Palpable  Brachial Palpable Palpable  Carotid Palpable, No Bruit Palpable, No Bruit  Aorta Not palpable N/A  Femoral Palpable Palpable  Popliteal Not palpable Not palpable  PT Palpable Palpable  DP Palpable Palpable    Gastro- intestinal soft, non-distended, TTP in RLQ quadrant, No guarding or rebound, no HSM, no masses, no CVAT B, No  palpable prominent aortic pulse,    Musculo- skeletal M/S 5/5 throughout  , Extremities without ischemic changes  , No edema present, No visible varicosities , No Lipodermatosclerosis present  Neurologic Cranial nerves 2-12 intact , Pain and light touch intact in extremities , Motor exam as listed above  Psychiatric Judgement intact, Mood & affect appropriate for pt's clinical situation  Dermatologic See M/S exam for extremity exam, No rashes otherwise noted  Lymphatic  Palpable lymph nodes: None    Radiology     Dg Chest 2 View  Result Date: 05/14/2017 CLINICAL DATA:  Chest pain EXAM: CHEST  2 VIEW COMPARISON:  None. FINDINGS: The heart size and mediastinal contours are within normal limits. Both lungs are clear. The visualized skeletal structures are unremarkable. IMPRESSION: No active cardiopulmonary disease. Electronically Signed   By: Donavan Foil M.D.   On: 05/14/2017 23:20   Ct Abdomen Pelvis W Contrast  Result Date: 05/14/2017 CLINICAL DATA:  Initial evaluation for acute epigastric pain, nausea. EXAM: CT ABDOMEN AND PELVIS WITH CONTRAST TECHNIQUE: Multidetector CT imaging of the abdomen and pelvis was performed using the standard protocol following bolus administration of intravenous contrast. CONTRAST:  173mL ISOVUE-300 IOPAMIDOL (ISOVUE-300) INJECTION 61% COMPARISON:  None available. FINDINGS: Lower chest: Mild scattered bibasilar atelectatic changes present within the visualized lungs. Visualized lungs are otherwise clear. Hepatobiliary: Subcentimeter hypodensity within the right hepatic lobe noted, too small the characterize, but of doubtful significance. Liver otherwise unremarkable. No portal venous gas. Gallbladder within normal limits. No biliary dilatation. Pancreas: Pancreas within normal limits. Spleen: Spleen within normal limits. Adrenals/Urinary Tract: Adrenal glands are normal. Kidneys equal in size with symmetric enhancement. 2.2 cm cyst present within the interpolar  right kidney. Few additional subcentimeter hypodensities noted, too small the characterize, but statistically likely reflects small cysts as well. Punctate 3 mm nonobstructive stone present within the lower pole the right kidney. No hydronephrosis or focal enhancing renal mass. No hydroureter. Partially distended bladder within normal limits. Stomach/Bowel: Stomach within normal limits. No evidence for bowel obstruction. Appendix normal. No acute inflammatory changes seen about the bowels. No pneumatosis or evidence for bowel ischemia. Vascular/Lymphatic: Normal intravascular enhancement seen within the intra-abdominal aorta. There is irregular filling defect within the superior mesenteric artery, concerning for thrombus (series 2, image 33). Filling defects extend into the distal SMA branches. Thrombus is partially occlusive proximally, not well evaluated distally. Remainder of the major arterial branches extending from the aorta appear patent proximally with no other identifiable thrombus. No pathologically enlarged intra-abdominal or pelvic lymph nodes. Reproductive: Prostate appears to be absent. Other: No free air or fluid. Mildly complex fat containing paraumbilical hernia noted. Musculoskeletal: No acute osseus abnormality. No worrisome lytic or blastic osseous lesions. IMPRESSION: 1. Irregular filling defect within the proximal superior mesenteric artery, concerning for thrombus. Vascular surgery consultation recommended. Correlation with serum lactate suggested. No CT findings to suggest acute bowel ischemia at this time. 2. No other acute intra-abdominal or pelvic process. 3. 3 mm nonobstructive right renal nephrolithiasis. 4. Sequelae of prior prostatectomy. Critical Value/emergent results were called by telephone at the time of interpretation on 05/14/2017 at 11:42 pm to the physician assistant taking care of the patient Marcus Pruitt , who verbally acknowledged these results. Electronically Signed    By: Jeannine Boga M.D.   On: 05/14/2017 23:44   I reviewed this patient's CT abd/pelvis, I agree that the CT is concerning for SMA embolism.   Laboratory   CBC CBC Latest Ref Rng & Units 05/14/2017 02/22/2016 02/21/2016  WBC 4.0 - 10.5 K/uL 5.8 - -  Hemoglobin 13.0 - 17.0 g/dL 15.8 12.6(L) 14.7  Hematocrit 39.0 - 52.0 % 46.7 37.7(L) 42.8  Platelets 150 - 400 K/uL 197 - -    BMP BMP Latest Ref Rng & Units 05/14/2017 02/22/2016 02/12/2016  Glucose 65 - 99 mg/dL 105(H) 123(H) 100(H)  BUN 6 - 20 mg/dL 12 10 11   Creatinine 0.61 - 1.24 mg/dL 1.19 1.20 0.99  Sodium 135 - 145 mmol/L 136 133(L) 138  Potassium 3.5 - 5.1 mmol/L 2.9(L) 4.1  4.3  Chloride 101 - 111 mmol/L 99(L) 101 105  CO2 22 - 32 mmol/L 24 26 25   Calcium 8.9 - 10.3 mg/dL 9.8 8.6(L) 9.1    Coagulation Lab Results  Component Value Date   INR 0.97 05/15/2017   No results found for: PTT  Lipids    Component Value Date/Time   CHOL 205 (H) 03/11/2015 0904   TRIG 209.0 (H) 03/11/2015 0904   HDL 47.00 03/11/2015 0904   CHOLHDL 4 03/11/2015 0904   VLDL 41.8 (H) 03/11/2015 0904   [place] 95 01/21/2011 1534   LDLDIRECT 118.0 03/11/2015 2500      Medical Decision Making   Marcus Pruitt is a 46 y.o. male who presents with: possible acute mesenteric ischemia   Based on the patient's vascular studies and examination, I have offered the patient: mesenteric angiogram, possible SMA embolectomy, possible bowel resection, and other indicated procedures in hybrid OR.  If mesenteric angiogram confirms SMA embolus, will proceed with SMA embolectomy, bowel resection and other indicated procedures.  We discussed the possibility of need for placement of temporary abdominal VAC placement for delayed second look at the intestines in the event bowel ischemia is found upon laparatomy.  The patient is aware the risks of this procedure include but are not limited to: bleeding, infection, need for transfusion, infection, death, stroke,  wound complications, bowel injuries, bowel ischemia, extended ventilation, incomplete evacuation of mesenteric embolus from mesenteric branches and future ventral hernias.    The patient is aware of the risks and has elected to proceed.   Adele Barthel, MD, FACS [facility] Office: [phone] Pager: [phone]  05/15/2017, 1:31 AM

## 2017-05-16 ENCOUNTER — Other Ambulatory Visit: Payer: Self-pay

## 2017-05-16 ENCOUNTER — Inpatient Hospital Stay (HOSPITAL_COMMUNITY): Payer: 59

## 2017-05-16 LAB — BASIC METABOLIC PANEL
Anion gap: 5 (ref 5–15)
BUN: 11 mg/dL (ref 6–20)
CHLORIDE: 106 mmol/L (ref 101–111)
CO2: 25 mmol/L (ref 22–32)
Calcium: 7.3 mg/dL — ABNORMAL LOW (ref 8.9–10.3)
Creatinine, Ser: 1 mg/dL (ref 0.61–1.24)
GFR calc Af Amer: >60 mL/min (ref >60)
GFR calc non Af Amer: >60 mL/min (ref >60)
Glucose, Bld: 147 mg/dL — ABNORMAL HIGH (ref 65–99)
POTASSIUM: 3.9 mmol/L (ref 3.5–5.1)
Sodium: 136 mmol/L (ref 135–145)

## 2017-05-16 LAB — LACTIC ACID, PLASMA
Lactic Acid, Venous: 1.7 mmol/L (ref 0.5–1.9)
Lactic Acid, Venous: 1.1 mmol/L (ref 0.5–1.9)

## 2017-05-16 LAB — COMPREHENSIVE METABOLIC PANEL
ALT: 18 U/L (ref 17–63)
AST: 36 U/L (ref 15–41)
Albumin: 2.9 g/dL — ABNORMAL LOW (ref 3.5–5.0)
Alkaline Phosphatase: 22 U/L — ABNORMAL LOW (ref 38–126)
Anion gap: 3 — ABNORMAL LOW (ref 5–15)
BUN: 11 mg/dL (ref 6–20)
CHLORIDE: 107 mmol/L (ref 101–111)
CO2: 27 mmol/L (ref 22–32)
CREATININE: 1.01 mg/dL (ref 0.61–1.24)
Calcium: 7.3 mg/dL — ABNORMAL LOW (ref 8.9–10.3)
GFR calc Af Amer: >60 mL/min (ref >60)
GFR calc non Af Amer: >60 mL/min (ref >60)
Glucose, Bld: 132 mg/dL — ABNORMAL HIGH (ref 65–99)
Potassium: 3.9 mmol/L (ref 3.5–5.1)
SODIUM: 137 mmol/L (ref 135–145)
Total Bilirubin: 0.6 mg/dL (ref 0.3–1.2)
Total Protein: 4.8 g/dL — ABNORMAL LOW (ref 6.5–8.1)

## 2017-05-16 LAB — CBC
HCT: 29.5 % — ABNORMAL LOW (ref 39.0–52.0)
HEMOGLOBIN: 9.9 g/dL — AB (ref 13.0–17.0)
MCH: 28.1 pg (ref 26.0–34.0)
MCHC: 33.6 g/dL (ref 30.0–36.0)
MCV: 83.8 fL (ref 78.0–100.0)
Platelets: 120 10*3/uL — ABNORMAL LOW (ref 150–400)
RBC: 3.52 MIL/uL — ABNORMAL LOW (ref 4.22–5.81)
RDW: 15.7 % — ABNORMAL HIGH (ref 11.5–15.5)
WBC: 7.4 10*3/uL (ref 4.0–10.5)

## 2017-05-16 LAB — TYPE AND SCREEN
ABO/RH(D): A POS
Antibody Screen: NEGATIVE
UNIT DIVISION: 0
UNIT DIVISION: 0

## 2017-05-16 LAB — HEPARIN LEVEL (UNFRACTIONATED): Heparin Unfractionated: 0.43 IU/mL (ref 0.30–0.70)

## 2017-05-16 LAB — MAGNESIUM: Magnesium: 2.2 mg/dL (ref 1.7–2.4)

## 2017-05-16 LAB — GLUCOSE, CAPILLARY
Glucose-Capillary: 117 mg/dL — ABNORMAL HIGH (ref 65–99)
Glucose-Capillary: 106 mg/dL — ABNORMAL HIGH (ref 65–99)
Glucose-Capillary: 110 mg/dL — ABNORMAL HIGH (ref 65–99)

## 2017-05-16 LAB — BPAM RBC
BLOOD PRODUCT EXPIRATION DATE: 201812062359
Blood Product Expiration Date: 201812062359
ISSUE DATE / TIME: 201811171125
ISSUE DATE / TIME: 201811171125
UNIT TYPE AND RH: 6200
Unit Type and Rh: 6200

## 2017-05-16 LAB — HEMOGLOBIN AND HEMATOCRIT, BLOOD
HCT: 31.1 % — ABNORMAL LOW (ref 39.0–52.0)
HEMATOCRIT: 29.4 % — AB (ref 39.0–52.0)
HEMOGLOBIN: 9.7 g/dL — AB (ref 13.0–17.0)
HEMOGLOBIN: 10.3 g/dL — AB (ref 13.0–17.0)

## 2017-05-16 LAB — AMYLASE: AMYLASE: 104 U/L — AB (ref 28–100)

## 2017-05-16 LAB — BLOOD GAS, ARTERIAL
ACID-BASE EXCESS: 1.5 mmol/L (ref 0.0–2.0)
Bicarbonate: 26.2 mmol/L (ref 20.0–28.0)
DRAWN BY: 36277
FIO2: 50
LHR: 16 {breaths}/min
MECHVT: 510 mL
O2 SAT: 98.7 %
PEEP/CPAP: 5 cmH2O
PH ART: 7.374 (ref 7.350–7.450)
Patient temperature: 98.6
pCO2 arterial: 46 mmHg (ref 32.0–48.0)
pO2, Arterial: 120 mmHg — ABNORMAL HIGH (ref 83.0–108.0)

## 2017-05-16 LAB — PROTIME-INR
INR: 1.24
PROTHROMBIN TIME: 15.5 s — AB (ref 11.4–15.2)

## 2017-05-16 LAB — PHOSPHORUS: PHOSPHORUS: 2.3 mg/dL — AB (ref 2.5–4.6)

## 2017-05-16 LAB — APTT: aPTT: 134 seconds — ABNORMAL HIGH (ref 24–36)

## 2017-05-16 LAB — HIV ANTIBODY (ROUTINE TESTING W REFLEX): HIV SCREEN 4TH GENERATION: NONREACTIVE

## 2017-05-16 MED ORDER — VITAL HIGH PROTEIN PO LIQD
1000.0000 mL | ORAL | Status: DC
  Administered 2017-05-16: 1000 mL

## 2017-05-16 MED ORDER — POTASSIUM PHOSPHATES 15 MMOLE/5ML IV SOLN
20.0000 meq | Freq: Once | INTRAVENOUS | Status: AC
  Administered 2017-05-16: 20 meq via INTRAVENOUS
  Filled 2017-05-16: qty 4.55

## 2017-05-16 MED ORDER — SODIUM CHLORIDE 0.9 % IV BOLUS (SEPSIS)
1000.0000 mL | Freq: Once | INTRAVENOUS | Status: AC
  Administered 2017-05-16: 1000 mL via INTRAVENOUS

## 2017-05-16 NOTE — Progress Notes (Signed)
PULMONARY / CRITICAL CARE MEDICINE   Name: Marcus Pruitt MRN: [id number] DOB: [birth year]    ADMISSION DATE:  05/14/2017 CONSULTATION DATE:  05/15/17  REFERRING MD: Dr Bridgett Larsson  CHIEF COMPLAINT: SMA dissection; Vent management  HISTORY OF PRESENT ILLNESS:   [name] with hx HTN, OSA, Prostate cancer s/p resection, who presented to the [facility] ER on 11/16 PM c/o sudden onset excruciating mid-abdominal pain that began 11/16 at 8pm. CT showed questionable SMA clot. Patient started on heparin and transferred emergently to [facility] where he was brought to the OR and mesenteric angiogram showed it to actually be an SMA dissection, with multiple false lumens and with clot within it. Dr Bridgett Larsson reported the OR case was long and complicated with approx 2 LITERS of blood loss. Postop he wants to keep patient intubated while we work to stabilize him, then will readdress vent weaning.     SUBJECTIVE:  RN reports no acute events.  Ex-wife, daughter and girlfriend have visited this am.  Remains off vasopressors.  On heparin gtt, precedex, fentanyl.     VITAL SIGNS: BP 104/65 (BP Location: Left Arm)   Pulse 73   Temp 98.7 F (37.1 C) (Oral)   Resp 16   Ht 5\' 6"  (1.676 m)   Wt 216 lb 0.8 oz (98 kg)   SpO2 100%   BMI 34.87 kg/m   HEMODYNAMICS:    VENTILATOR SETTINGS: Vent Mode: PRVC FiO2 (%):  [40 %-50 %] 40 % Set Rate:  [14 bmp-16 bmp] 16 bmp Vt Set:  [510 mL] 510 mL PEEP:  [5 cmH20] 5 cmH20 Plateau Pressure:  [14 cmH20-23 cmH20] 19 cmH20  INTAKE / OUTPUT: I/O last 3 completed shifts: In: 70263 [I.V.:7383; Blood:315; NG/GT:110; IV Piggyback:2200] Out: 7858 [IFOYD:7412; Emesis/NG output:400; Blood:2300]  PHYSICAL EXAMINATION: General: young adult male in NAD on vent HEENT: MM pink/moist, ETT, opens eyes to command Neuro: awakens to voice, follows commands  CV: s1s2 rrr, no m/r/g PULM: even/non-labored, lungs bilaterally clear  GI: protuberant, soft, midline dressing c/d/i  Extremities: warm/dry, no edema   Skin: no rashes or lesions  LABS:  BMET Recent Labs  Lab 05/15/17 1230 05/15/17 2353 05/16/17 0601  NA 133* 136 137  K 5.2* 3.9 3.9  CL 103 106 107  CO2 23 25 27   BUN 9 11 11   CREATININE 1.16 1.00 1.01  GLUCOSE 143* 147* 132*    Electrolytes Recent Labs  Lab 05/15/17 1230 05/15/17 1926 05/15/17 2353 05/16/17 0601  CALCIUM 7.4*  --  7.3* 7.3*  MG 1.5*  --   --  2.2  PHOS  --  2.7  --  2.3*    CBC Recent Labs  Lab 05/14/17 2126 05/15/17 1230 05/15/17 2353 05/16/17 0601  WBC 5.8 11.6*  --  7.4  HGB 15.8 11.6* 10.3* 9.9*  HCT 46.7 34.7* 31.1* 29.5*  PLT 197 149*  --  120*    Coag's Recent Labs  Lab 05/15/17 0031 05/15/17 1230 05/15/17 2353  APTT 23* 28 134*  INR 0.97 1.19 1.24    Sepsis Markers Recent Labs  Lab 05/15/17 1944 05/16/17 0025 05/16/17 0558  LATICACIDVEN 1.9 1.7 1.1    ABG Recent Labs  Lab 05/15/17 1330 05/16/17 0420  PHART 7.343* 7.374  PCO2ART 48.2* 46.0  PO2ART 88.0 120*    Liver Enzymes Recent Labs  Lab 05/14/17 2126 05/16/17 0601  AST 41 36  ALT 34 18  ALKPHOS 47 22*  BILITOT 0.9 0.6  ALBUMIN 4.4 2.9*    Cardiac Enzymes  No results for input(s): TROPONINI, PROBNP in the last 168 hours.  Glucose No results for input(s): GLUCAP in the last 168 hours.  Imaging Dg Chest Port 1 View  Result Date: 05/16/2017 CLINICAL DATA:  Respiratory failure EXAM: PORTABLE CHEST 1 VIEW COMPARISON:  05/15/2017 FINDINGS: Endotracheal tube and NG tube are stable. Low lung volumes with bibasilar atelectasis and mild vascular congestion. Improving aeration at the left base since prior study. Question small effusions. IMPRESSION: Continued low lung volumes with improving aeration at the left base. Mild residual bibasilar atelectasis. Vascular congestion. Electronically Signed   By: Rolm Baptise M.D.   On: 05/16/2017 07:53   Dg Chest Port 1 View  Result Date: 05/15/2017 CLINICAL DATA:  Acute respiratory failure with hypoxia. EXAM:  PORTABLE CHEST 1 VIEW COMPARISON:  Chest x-ray dated 05/14/2017. FINDINGS: Endotracheal tube appears appropriately position with tip approximately 2 cm above the carina. Enteric tube passes below the diaphragm. Study is hypoinspiratory. New opacity at the left lung base could represent atelectasis/airspace collapse or pleural effusion. No pneumothorax seen. IMPRESSION: 1. Low lung volumes limiting characterization. New dense opacity at the left lung base could represent atelectasis/airspace collapse and/or pleural effusion. 2. Endotracheal tube appears well positioned with tip approximately 2 cm above the carina. 3. Enteric tube appears appropriately positioned in the stomach. Electronically Signed   By: Franki Cabot M.D.   On: 05/15/2017 13:53   Dg Abd Portable 1v  Result Date: 05/15/2017 CLINICAL DATA:  Postop. EXAM: PORTABLE ABDOMEN - 1 VIEW COMPARISON:  None. FINDINGS: Surgical staples overlie the left abdomen and left pelvis no dilated large or small bowel loops seen. Enteric tube appears adequately positioned in the stomach. IMPRESSION: Enteric tube is adequately positioned in the stomach. No dilated large or small bowel loops. Electronically Signed   By: Franki Cabot M.D.   On: 05/15/2017 13:54   STUDIES:  CT Abdomen (11/16):  1. Irregular filling defect within the proximal superior mesenteric artery, concerning for thrombus. Vascular surgery consultation recommended. Correlation with serum lactate suggested. No CT findings to suggest acute bowel ischemia at this time. 2. No other acute intra-abdominal or pelvic process. 3. 3 mm nonobstructive right renal nephrolithiasis. 4. Sequelae of prior prostatectomy.  ANTIBIOTICS: Ertapenem 11/17 x1 (OR) Cefuroxime 11/17 x1 (OR)  SIGNIFICANT EVENTS: 11/16  Presented to [facility] with abd pain >> appears to be SMA clot per Abdominal CT 11/17  Tx to [facility] & underwent emergent surgery for SMA dissection with clot >> surgically repaired, 2L EBL > to  ICU  LINES/TUBES: 18G PIV, 22G PIV Left Radial Aline 11/17 >> Foley catheter 11/17 >> ETT 11/17 >>  ASSESSMENT / PLAN: [name] with hx HTN, OSA, Prostate cancer s/p resection, who presented to the [facility] ER on 11/16 PM c/o sudden onset excruciating mid-abdominal pain that began 11/16 at 8pm. CT showed questionable SMA clot. Patient started on heparin and transferred emergently to [facility] where he was brought to the OR and mesenteric angiogram showed it to actually be an SMA dissection, with multiple false lumens and with clot within it. Dr Bridgett Larsson says the OR case was long and complicated with approx 2 LITERS of blood loss. Postop keep intubated.  PULMONARY A: Acute Hypoxic Respiratory failure OSA P: PRVC 8cc/kg  Wean PEEP / FiO2 for sats > 90% Follow intermittent CXR  Aspiration precautions  Will need CPAP post extubation No wean until ok per VVS, see below   CARDIOVASCULAR A: SMA Dissection and SMA Clot s/p Surgical Repair Hemorrhagic Shock  Hx  HTN P: ICU monitoring of hemodynamics Goal SBP < 120 PRN labetalol, hydralazine for SBP goals Heparin gtt per pharmacy  Will need work up for possible Marfan's or [employer] given unusual SMA dissection w/o aortic dissection in a young patient, even with chronically uncontrolled HTN Monitor H/H > volume resuscitation with PRBC's if needed  Plan for ~ 72 hours on mechanical ventilation per VVS  RENAL A: AKI Hypokalemia Hypophosphatemia P: Trend BMP / urinary output Replace electrolytes as indicated, KPhos 11/18 Avoid nephrotoxic agents, ensure adequate renal perfusion D5LR @ 100 ml/hr   GASTROINTESTINAL A: At Risk Malnutrition  P: NPO  Pepcid for stress ulcer prophylaxis  Begin TF per Nutrition  HEMATOLOGIC A: Acute Blood Loss - in setting of SMA dissection, s/p surgical repair  Prostate cancer s/p resection P: Trend CBC   INFECTIOUS A: Post-OP SMA repair  P: Monitor fever curve / WBC trend   ENDOCRINE A: At Risk  Hypo/Hyperglycemia  P: Monitor glucose on BMP  NEUROLOGIC A: Acute Encephalopathy  Former [employer] conservation officer / Family suspected PTSD with prior ETOH (remote) use  P: Precedex for delirium / sedation  Fentanyl gtt for pain  Serial neuro exams    FAMILY  - Daughter (58 y/o, next of kin) and ex-wife updated at bedside.  There is also a girlfriend and a 63 y/o son.  Son is coming in to visit.  Pt apparently has a sister that is to visit as well.   - Inter-disciplinary family meet or Palliative Care meeting due by: 05/22/17  CC Time: 71 minutes   Noe Gens, NP-C [facility] Pgr: [phone] or if no answer [phone] 05/16/2017, 9:29 AM

## 2017-05-16 NOTE — Progress Notes (Signed)
[facility] Progress Note Patient Name: Dimitris Shanahan DOB: Mar 20, 1971 MRN: [id number]   Date of Service  05/16/2017  HPI/Events of Note  Hypotension - BP = 85/48. Likely related to sedation. Propofol and Fentanyl IV infusion both decreased.   eICU Interventions  Will bolus with 0.9 NaCl 1 liter IV over 1 hour now.      Intervention Category Major Interventions: Hypotension - evaluation and management  Sommer,Steven Eugene 05/16/2017, 10:15 PM

## 2017-05-16 NOTE — Progress Notes (Signed)
    Vitals:   05/16/17 1700 05/16/17 1800 05/16/17 1930 05/16/17 2000  BP:  106/63  102/61  Pulse: 81 83 85 84  Resp: 16 16 16 16   Temp:  100.3 F (37.9 C)    TempSrc:  Oral    SpO2: 100% 100% 100% 100%  Weight:      Height:       CBC Latest Ref Rng & Units 05/16/2017 05/16/2017 05/15/2017  WBC 4.0 - 10.5 K/uL - 7.4 -  Hemoglobin 13.0 - 17.0 g/dL 9.7(L) 9.9(L) 10.3(L)  Hematocrit 39.0 - 52.0 % 29.4(L) 29.5(L) 31.1(L)  Platelets 150 - 400 K/uL - 120(L) -   - H/H stable and HD stable - UOP stable > 50 cc/hr - Hopefully will be extubated tomorrow - Dr. Scot Dock will be covering for me tomorrow   Adele Barthel, MD, [place] [facility] Office: [phone] Pager: [phone]  05/16/2017, 8:18 PM

## 2017-05-16 NOTE — Progress Notes (Addendum)
   Daily Progress Note   Assessment/Planning:   POD #1 s/p Open resection of SMA flap, TE and BPA SMA,    Overall, physiologically this patient has recovered surprisingly well.  Hopefully can extubate today.  NEURO: sed for vent, wean for vent weaning  PULM: hopefully extubate today, appreciate PCCM help  CV: stable  GI: no evidence of acute abd, somewhat distended, less pancreatitis than anticipated  FEN: hyperK resolved  REN: excellent UOP today  HEME:   I doubt the patient is actively bleeding, rather I suspect the ~30% HCT is the real HCT after a 2 L EBL.  As pt doesn't have clinical evidence of sx anemia, ok to hold on further transfusion for now.  Check H/H q12 hour  Anticoagulation essential to aid SMA patency given the resection of the intimal flap and possible residual intimal disruption in mid-distal segment  ID: post-prophylactic abx given   Subjective  - 1 Day Post-Op   No events overnight: off vasopressor shortly after surgery, labs stable   Objective   Vitals:   05/16/17 0500 05/16/17 0600 05/16/17 0700 05/16/17 0801  BP:      Pulse: 75 76 74   Resp: 16 16 16    Temp:    98.7 F (37.1 C)  TempSrc:    Oral  SpO2: 100% 100% 100%   Weight: 216 lb 0.8 oz (98 kg)     Height:         Intake/Output Summary (Last 24 hours) at 05/16/2017 0843 Last data filed at 05/16/2017 0700 Gross per 24 hour  Intake 5558 ml  Output 2990 ml  Net 2568 ml    PULM  Intubated, mech vent sounds  CV  RRR  GI  soft, distended, minimal BS, no guarding or rebound  VASC Viable legs  NEURO Awakes to voice, somulent    Laboratory   CBC CBC Latest Ref Rng & Units 05/16/2017 05/15/2017 05/15/2017  WBC 4.0 - 10.5 K/uL 7.4 - 11.6(H)  Hemoglobin 13.0 - 17.0 g/dL 9.9(L) 10.3(L) 11.6(L)  Hematocrit 39.0 - 52.0 % 29.5(L) 31.1(L) 34.7(L)  Platelets 150 - 400 K/uL 120(L) - 149(L)    BMET    Component Value Date/Time   NA 137 05/16/2017 0601   K 3.9 05/16/2017 0601   CL 107 05/16/2017 0601   CO2 27 05/16/2017 0601   GLUCOSE 132 (H) 05/16/2017 0601   BUN 11 05/16/2017 0601   CREATININE 1.01 05/16/2017 0601   CALCIUM 7.3 (L) 05/16/2017 0601   GFRNONAA >60 05/16/2017 0601   GFRAA >60 05/16/2017 0601     Adele Barthel, MD, FACS [facility] [place] Office: [phone] Pager: [phone]  05/16/2017, 8:43 AM

## 2017-05-16 NOTE — Progress Notes (Signed)
Patient hypotensive. SBP 80s.  Sedated on Precedex and Fentanyl--both infusion rates decreased. Spoke to Dr. Oletta Darter (e-link). Order received for 1L NS bolus.

## 2017-05-16 NOTE — Progress Notes (Signed)
PT Cancellation Note  Patient Details Name: Marcus Pruitt MRN: [id number] DOB: 09-06-1970   Cancelled Treatment:    Reason Eval/Treat Not Completed: Patient not medically ready.  Patient remains on vent.  Will return at later time for PT evaluation.   Despina Pole 05/16/2017, 12:58 PM Carita Pian. Sanjuana Kava, [place] Pager [phone]

## 2017-05-16 NOTE — Progress Notes (Signed)
ANTICOAGULATION CONSULT NOTE   Pharmacy Consult for IV heparin Indication: superior mesenteric vein thrombus/VTE  No Known Allergies  Patient Measurements: Height: 5\' 6"  (167.6 cm) Weight: 216 lb 0.8 oz (98 kg) IBW/kg (Calculated) : 63.8 Heparin Dosing Weight: 74 kg  Vital Signs: Temp: 98.7 F (37.1 C) (11/18 0801) Temp Source: Oral (11/18 0801) BP: 104/65 (11/18 0700) Pulse Rate: 73 (11/18 0900)  Labs: Recent Labs    05/14/17 2126 05/15/17 0031 05/15/17 1230 05/15/17 1950 05/15/17 2353 05/16/17 0600 05/16/17 0601  HGB 15.8  --  11.6*  --  10.3*  --  9.9*  HCT 46.7  --  34.7*  --  31.1*  --  29.5*  PLT 197  --  149*  --   --   --  120*  APTT  --  23* 28  --  134*  --   --   LABPROT  --  12.8 15.0  --  15.5*  --   --   INR  --  0.97 1.19  --  1.24  --   --   HEPARINUNFRC  --   --   --  0.34  --  0.43  --   CREATININE 1.19  --  1.16  --  1.00  --  1.01    Estimated Creatinine Clearance: 100.2 mL/min (by C-G formula based on SCr of 1.01 mg/dL).  Assessment: 50 yoM c/o sudden excruciating mid abdominal pain found to have superior mesenteric artery thrombus. Heparin reordered post-op.   Heparin level is therapeutic this morning on 1200 units/hr. Hbg down from 15 on admit to 9.9 this am, vascular does not believe patient is bleeding more so ABLA, heparin to remain and follow cbc closely.   Goal of Therapy:  Heparin level 0.3-0.7 units/ml Monitor platelets by anticoagulation protocol: Yes   Plan:  Continue heparin gtt 1200 units/hr Daily heparin level and CBC  Erin Hearing PharmD., BCPS Clinical Pharmacist Pager [phone] 05/16/2017 10:29 AM

## 2017-05-16 NOTE — Progress Notes (Signed)
[place] Progress Note Patient Name: Marcus Pruitt DOB: 14-Mar-1971 MRN: [id number]   Date of Service  05/16/2017  HPI/Events of Note  Agitation - Request for bilateral soft wrist restraints.   eICU Interventions  Will order bilateral soft wrist restraints.      Intervention Category Minor Interventions: Agitation / anxiety - evaluation and management  Lameeka Schleifer Eugene 05/16/2017, 9:22 PM

## 2017-05-17 ENCOUNTER — Inpatient Hospital Stay (HOSPITAL_COMMUNITY): Payer: 59

## 2017-05-17 ENCOUNTER — Encounter (HOSPITAL_COMMUNITY): Payer: Self-pay | Admitting: Vascular Surgery

## 2017-05-17 DIAGNOSIS — R109 Unspecified abdominal pain: Secondary | ICD-10-CM

## 2017-05-17 DIAGNOSIS — K55059 Acute (reversible) ischemia of intestine, part and extent unspecified: Secondary | ICD-10-CM

## 2017-05-17 DIAGNOSIS — R579 Shock, unspecified: Secondary | ICD-10-CM

## 2017-05-17 LAB — GLUCOSE, CAPILLARY
GLUCOSE-CAPILLARY: 141 mg/dL — AB (ref 65–99)
Glucose-Capillary: 107 mg/dL — ABNORMAL HIGH (ref 65–99)
Glucose-Capillary: 109 mg/dL — ABNORMAL HIGH (ref 65–99)
Glucose-Capillary: 121 mg/dL — ABNORMAL HIGH (ref 65–99)
Glucose-Capillary: 131 mg/dL — ABNORMAL HIGH (ref 65–99)

## 2017-05-17 LAB — POCT I-STAT 7, (LYTES, BLD GAS, ICA,H+H)
ACID-BASE DEFICIT: 3 mmol/L — AB (ref 0.0–2.0)
Acid-base deficit: 2 mmol/L (ref 0.0–2.0)
Acid-base deficit: 6 mmol/L — ABNORMAL HIGH (ref 0.0–2.0)
BICARBONATE: 24 mmol/L (ref 20.0–28.0)
Bicarbonate: 20 mmol/L (ref 20.0–28.0)
Bicarbonate: 22.3 mmol/L (ref 20.0–28.0)
CALCIUM ION: 1.1 mmol/L — AB (ref 1.15–1.40)
Calcium, Ion: 1 mmol/L — ABNORMAL LOW (ref 1.15–1.40)
Calcium, Ion: 1.04 mmol/L — ABNORMAL LOW (ref 1.15–1.40)
HCT: 35 % — ABNORMAL LOW (ref 39.0–52.0)
HCT: 30 % — ABNORMAL LOW (ref 39.0–52.0)
HCT: 29 % — ABNORMAL LOW (ref 39.0–52.0)
HEMOGLOBIN: 9.9 g/dL — AB (ref 13.0–17.0)
Hemoglobin: 11.9 g/dL — ABNORMAL LOW (ref 13.0–17.0)
Hemoglobin: 10.2 g/dL — ABNORMAL LOW (ref 13.0–17.0)
O2 SAT: 100 %
O2 Saturation: 99 %
O2 Saturation: 99 %
PCO2 ART: 43.4 mmHg (ref 32.0–48.0)
PCO2 ART: 39.1 mmHg (ref 32.0–48.0)
PCO2 ART: 39.1 mmHg (ref 32.0–48.0)
PH ART: 7.368 (ref 7.350–7.450)
PO2 ART: 175 mmHg — AB (ref 83.0–108.0)
PO2 ART: 197 mmHg — AB (ref 83.0–108.0)
PO2 ART: 129 mmHg — AB (ref 83.0–108.0)
Patient temperature: 37.3
Potassium: 4.1 mmol/L (ref 3.5–5.1)
Potassium: 3.9 mmol/L (ref 3.5–5.1)
Potassium: 4.6 mmol/L (ref 3.5–5.1)
SODIUM: 137 mmol/L (ref 135–145)
Sodium: 140 mmol/L (ref 135–145)
Sodium: 137 mmol/L (ref 135–145)
TCO2: 25 mmol/L (ref 22–32)
TCO2: 21 mmol/L — AB (ref 22–32)
TCO2: 23 mmol/L (ref 22–32)
pH, Arterial: 7.35 (ref 7.350–7.450)
pH, Arterial: 7.317 — ABNORMAL LOW (ref 7.350–7.450)

## 2017-05-17 LAB — POCT ACTIVATED CLOTTING TIME: ACTIVATED CLOTTING TIME: 120 s

## 2017-05-17 LAB — BASIC METABOLIC PANEL
ANION GAP: 5 (ref 5–15)
BUN: 11 mg/dL (ref 6–20)
CHLORIDE: 108 mmol/L (ref 101–111)
CO2: 24 mmol/L (ref 22–32)
Calcium: 7.1 mg/dL — ABNORMAL LOW (ref 8.9–10.3)
Creatinine, Ser: 1.14 mg/dL (ref 0.61–1.24)
GFR calc non Af Amer: >60 mL/min (ref >60)
Glucose, Bld: 127 mg/dL — ABNORMAL HIGH (ref 65–99)
POTASSIUM: 3.9 mmol/L (ref 3.5–5.1)
Sodium: 137 mmol/L (ref 135–145)

## 2017-05-17 LAB — CBC
HEMATOCRIT: 26.3 % — AB (ref 39.0–52.0)
HEMOGLOBIN: 8.8 g/dL — AB (ref 13.0–17.0)
MCH: 28.3 pg (ref 26.0–34.0)
MCHC: 33.5 g/dL (ref 30.0–36.0)
MCV: 84.6 fL (ref 78.0–100.0)
Platelets: 121 10*3/uL — ABNORMAL LOW (ref 150–400)
RBC: 3.11 MIL/uL — ABNORMAL LOW (ref 4.22–5.81)
RDW: 15.3 % (ref 11.5–15.5)
WBC: 8.7 10*3/uL (ref 4.0–10.5)

## 2017-05-17 LAB — POCT I-STAT 4, (NA,K, GLUC, HGB,HCT)
GLUCOSE: 159 mg/dL — AB (ref 65–99)
HCT: 39 % (ref 39.0–52.0)
Hemoglobin: 13.3 g/dL (ref 13.0–17.0)
POTASSIUM: 3.7 mmol/L (ref 3.5–5.1)
SODIUM: 138 mmol/L (ref 135–145)

## 2017-05-17 LAB — HEPARIN LEVEL (UNFRACTIONATED): HEPARIN UNFRACTIONATED: 0.32 [IU]/mL (ref 0.30–0.70)

## 2017-05-17 MED ORDER — MORPHINE SULFATE (PF) 4 MG/ML IV SOLN
2.0000 mg | Freq: Once | INTRAVENOUS | Status: AC
  Administered 2017-05-17: 2 mg via INTRAVENOUS
  Filled 2017-05-17: qty 1

## 2017-05-17 MED ORDER — CHLORHEXIDINE GLUCONATE 0.12 % MT SOLN
15.0000 mL | Freq: Two times a day (BID) | OROMUCOSAL | Status: DC
  Administered 2017-05-17 – 2017-05-28 (×17): 15 mL via OROMUCOSAL
  Filled 2017-05-17 (×14): qty 15

## 2017-05-17 MED ORDER — MORPHINE SULFATE 2 MG/ML IV SOLN
INTRAVENOUS | Status: DC
  Administered 2017-05-17 (×2): via INTRAVENOUS
  Administered 2017-05-17: 21 mg via INTRAVENOUS
  Administered 2017-05-18: 17 mg via INTRAVENOUS
  Administered 2017-05-18: 15 mg via INTRAVENOUS
  Administered 2017-05-18: 10 mg via INTRAVENOUS
  Administered 2017-05-18: 9 mg via INTRAVENOUS
  Administered 2017-05-18: 12 mg via INTRAVENOUS
  Administered 2017-05-18: 8 mg via INTRAVENOUS
  Filled 2017-05-17: qty 30
  Filled 2017-05-17 (×2): qty 25

## 2017-05-17 MED ORDER — MORPHINE SULFATE (PF) 4 MG/ML IV SOLN
2.0000 mg | Freq: Once | INTRAVENOUS | Status: AC
  Administered 2017-05-17: 2 mg via INTRAVENOUS

## 2017-05-17 MED ORDER — ORAL CARE MOUTH RINSE
15.0000 mL | Freq: Two times a day (BID) | OROMUCOSAL | Status: DC
  Administered 2017-05-18 – 2017-05-22 (×7): 15 mL via OROMUCOSAL

## 2017-05-17 MED ORDER — NALOXONE HCL 0.4 MG/ML IJ SOLN
0.4000 mg | INTRAMUSCULAR | Status: DC | PRN
Start: 2017-05-17 — End: 2017-05-19

## 2017-05-17 MED ORDER — DEXTROSE 5 % IV SOLN
1.0000 g | Freq: Two times a day (BID) | INTRAVENOUS | Status: DC
  Administered 2017-05-17 – 2017-05-23 (×13): 1 g via INTRAVENOUS
  Filled 2017-05-17 (×13): qty 1

## 2017-05-17 MED ORDER — DIPHENHYDRAMINE HCL 12.5 MG/5ML PO ELIX: 12.5000 mg | ORAL_SOLUTION | Freq: Four times a day (QID) | ORAL | Status: DC | PRN

## 2017-05-17 MED ORDER — DIPHENHYDRAMINE HCL 50 MG/ML IJ SOLN: 12.5000 mg | Freq: Four times a day (QID) | INTRAMUSCULAR | Status: DC | PRN

## 2017-05-17 MED ORDER — MORPHINE SULFATE (PF) 4 MG/ML IV SOLN
INTRAVENOUS | Status: AC
  Filled 2017-05-17: qty 1

## 2017-05-17 MED ORDER — HYDRALAZINE HCL 20 MG/ML IJ SOLN
5.0000 mg | INTRAMUSCULAR | Status: DC | PRN
  Administered 2017-05-18: 5 mg via INTRAVENOUS
  Filled 2017-05-17: qty 1

## 2017-05-17 MED ORDER — LABETALOL HCL 5 MG/ML IV SOLN
10.0000 mg | INTRAVENOUS | Status: AC | PRN
  Administered 2017-05-17 – 2017-05-19 (×4): 10 mg via INTRAVENOUS
  Filled 2017-05-17 (×3): qty 4

## 2017-05-17 MED ORDER — SODIUM CHLORIDE 0.9% FLUSH: 9.0000 mL | INTRAVENOUS | Status: DC | PRN

## 2017-05-17 NOTE — Progress Notes (Signed)
Vascular MD called due to aline and NBP not correlating, NBP lower by 30 SBP.. MD states to treat the arterial line blood pressure.

## 2017-05-17 NOTE — Procedures (Signed)
Extubation Procedure Note  Patient Details:   Name: Marcus Pruitt DOB: 1971-02-28 MRN: [id number]   Airway Documentation:  Airway 7.5 mm (Active)  Secured at (cm) 25 cm 05/17/2017  8:45 AM  Measured From Lips 05/17/2017  8:45 AM  Secured Location Center 05/17/2017  8:45 AM  Secured By [employer] 05/17/2017  8:45 AM  Tube Holder Repositioned Yes 05/17/2017  8:45 AM  Cuff Pressure (cm H2O) 26 cm H2O 05/17/2017  8:45 AM  Site Condition Dry 05/17/2017  8:45 AM    Evaluation  O2 sats: stable throughout Complications: No apparent complications Patient did tolerate procedure well. Bilateral Breath Sounds: Clear, Diminished   Yes   Patient extubated per order to 4L [place] with no apparent complications. Positive cuff leak was noted prior to extubation. Patient is alert and oriented and is able to speak. Vitals are stable and sats are 99%. RT will continue to monitor.   Omara Alcon Clyda Greener 05/17/2017, 10:22 AM

## 2017-05-17 NOTE — Progress Notes (Signed)
Wasted appr  98mL of fentanyl from drip., leaked out of  Bag, cleaned ScorreaRN

## 2017-05-17 NOTE — Progress Notes (Addendum)
[facility]  VASCULAR SURGERY ASSESSMENT & PLAN:   Postop day 2 status post repair of superior mesenteric artery dissection with bovine pericardial patch angioplasty  T-max 102.  His white blood cell count is normal abdominal exam is currently unremarkable.  Will need to follow closely and consider CT scan to look for ischemic bowel if he has a persistent fever or develops an elevated white blood cell count.  He is currently not on antibiotics.  I will start him on cefepime DAY 1.  PULMONARY: Sedated  CARDIAC: Hemodynamically stable.  RENAL: Good urine output and creatinine remains normal.  NUTRITIONAL: Still no bowel sounds.  Continue trickle tube feeds.  On IV heparin.   Deitra Mayo, MD, FACS Beeper [phone] Office: [phone]    Subjective  - Intubated, opens eyes and follows commands.  Objective (!) 111/51 84 (!) 102 F (38.9 C) (Oral) 16 100%  Intake/Output Summary (Last 24 hours) at 05/17/2017 0856 Last data filed at 05/17/2017 0700 Gross per 24 hour  Intake 4836.18 ml  Output 1035 ml  Net 3801.18 ml    Palpable DP B Abdomen soft, no BS   Assessment/Planning: POD # 2  PROCEDURE: 1. Right common femoral artery cannulation under ultrasound guidance 2. Placement of catheter in aorta 3. Aortogram 4. Thrombectomy of superior mesenteric artery 5. 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 Bovine patch angioplasty superior mesenteric artery  7. Selection of superior mesenteric artery  8. Superior mesenteric artery angiogram   Plan to extubate today Will order low dose PCA Morphine Maintain NPO status, no BS Tm 102 this am, WBC 8.7 Hypotension on pressors Full dose heparin  [name] 05/17/2017 8:56 AM --  Laboratory Lab Results: Recent Labs    05/16/17 0601 05/16/17 1840 05/17/17 0346  WBC 7.4  --  8.7  HGB 9.9* 9.7* 8.8*  HCT 29.5* 29.4* 26.3*  PLT 120*  --  121*    BMET Recent Labs    05/16/17 0601 05/17/17 0346  NA 137 137  K 3.9 3.9  CL 107 108  CO2 27 24  GLUCOSE 132* 127*  BUN 11 11  CREATININE 1.01 1.14  CALCIUM 7.3* 7.1*    COAG Lab Results  Component Value Date   INR 1.24 05/15/2017   INR 1.19 05/15/2017   INR 0.97 05/15/2017   No results found for: PTT

## 2017-05-17 NOTE — Progress Notes (Signed)
Initial Nutrition Assessment  DOCUMENTATION CODES:   Obesity unspecified  INTERVENTION:   Monitor for diet advancement/toleration Provide Premier Protein BID, each supplement provides 160kcal and 30g protein once diet advanced.   NUTRITION DIAGNOSIS:   Inadequate oral intake related to inability to eat as evidenced by NPO status  GOAL:   Patient will meet greater than or equal to 90% of their needs  MONITOR:   PO intake, Supplement acceptance, TF tolerance, Diet advancement, Labs, I & O's  REASON FOR ASSESSMENT:   Consult Enteral/tube feeding initiation and management  ASSESSMENT:   Pt with PMH significant for HTN, and prostate cancer s/p resection. Presents this admission with with complaints of extreme abdominal pain. CT showed questionable SMA clot, mesenteric angiogram confirmed SMA dissection. Pt was intubated prior to surgery.   11/17- thrombectomy of superior mesenteric artery with patch placement 11/19- extubated, TF d/c  NGT remains in place. Tube feeding has been discontinued. Pt complaining of abdominal pain at this time. Nursing states pt may be able to self feed once abd pain subsides.   Spoke with pt at bedside. Reports having great appetite PTA consuming three meals per day with snacks. Denies any unintentional wt loss. States his UBW is 217 lb. Records indicate pt has maintained his current weight for > 1 year.   Medications reviewed and include: LR with D5 @ 75 ml/hr Labs reviewed: Phosphorus 2.3 (L)   NUTRITION - FOCUSED PHYSICAL EXAM:    Most Recent Value  Orbital Region  No depletion  Upper Arm Region  No depletion  Thoracic and Lumbar Region  Unable to assess  Buccal Region  No depletion  Temple Region  No depletion  Clavicle Bone Region  No depletion  Clavicle and Acromion Bone Region  No depletion  Scapular Bone Region  Unable to assess  Dorsal Hand  No depletion  Patellar Region  No depletion  Anterior Thigh Region  No depletion   Posterior Calf Region  No depletion  Edema (RD Assessment)  None  Hair  Reviewed  Eyes  Reviewed  Mouth  Unable to assess  Skin  Reviewed  Nails  Reviewed       Diet Order:  Diet NPO time specified  EDUCATION NEEDS:   Not appropriate for education at this time  Skin:  Skin Assessment: Reviewed RN Assessment  Last BM:  PTA  Height:   Ht Readings from Last 1 Encounters:  05/14/17 5\' 6"  (1.676 m)    Weight:   Wt Readings from Last 1 Encounters:  05/17/17 223 lb 8.7 oz (101.4 kg)    Ideal Body Weight:  64.5 kg  BMI:  Body mass index is 36.08 kg/m.  Estimated Nutritional Needs:   Kcal:  1800-2000 kcal/day  Protein:  90-100 g/day  Fluid:  >1.8 L/day    Mariana Single RD, LDN Clinical Nutrition Pager # - [phone]

## 2017-05-17 NOTE — Progress Notes (Signed)
[place] Progress Note Patient Name: Marcus Pruitt DOB: Aug 31, 1970 MRN: [id number]   Date of Service  05/17/2017  HPI/Events of Note  Pain - Already on a Morphine IV PCA and HTN - Request to renew Labetalol and Hydralazine IV orders.   eICU Interventions  Will order: 1. Morphine 2 mg IV now (Extra Dose). 2. Will renew Labetalol and Hydralazine IV orders.      Intervention Category Major Interventions: Hypertension - evaluation and management;Other:  Lysle Dingwall 05/17/2017, 9:10 PM

## 2017-05-17 NOTE — Progress Notes (Signed)
PT Cancellation Note  Patient Details Name: Marcus Pruitt MRN: [id number] DOB: Jan 04, 1971   Cancelled Treatment:    Reason Eval/Treat Not Completed: Patient not medically ready. Pt intubated and on continuous pressors. Will hold PT evaluation at this time and check back next date.   Mabeline Caras, PT, DPT Acute Rehab Services  Pager: [place] 05/17/2017, 7:44 AM

## 2017-05-17 NOTE — Progress Notes (Signed)
PULMONARY / CRITICAL CARE MEDICINE   Name: Marcus Pruitt MRN: [id number] DOB: [birth year]    ADMISSION DATE:  05/14/2017 CONSULTATION DATE:  05/15/17  REFERRING MD: Dr Bridgett Larsson  CHIEF COMPLAINT: SMA dissection; Vent management  HISTORY OF PRESENT ILLNESS:   48yoM with hx HTN, OSA, Prostate cancer s/p resection, who presented to [facility] ER on 11/16 PM c/o sudden onset excruciating mid-abdominal pain that began 11/16 at 8pm. CT showed questionable SMA clot. Patient started on heparin and transferred emergently to [facility] where he was brought to the OR and mesenteric angiogram showed it to actually be an SMA dissection, with multiple false lumens and with clot within it. Dr Bridgett Larsson reported the OR case was long and complicated with approx 2 LITERS of blood loss. Postop he wants to keep patient intubated while we work to stabilize him, then will readdress vent weaning.     SUBJECTIVE:   Awake, alert, appropriate.  Looks good on PS wean.  Slightly tachypneic likely r/t pain.  Wants ETT out.     VITAL SIGNS: BP (!) 111/51   Pulse 84   Temp (!) 102 F (38.9 C) (Oral)   Resp 16   Ht 5\' 6"  (1.676 m)   Wt 101.4 kg (223 lb 8.7 oz)   SpO2 100%   BMI 36.08 kg/m   HEMODYNAMICS:    VENTILATOR SETTINGS: Vent Mode: PRVC FiO2 (%):  [40 %] 40 % Set Rate:  [16 bmp] 16 bmp Vt Set:  [510 mL] 510 mL PEEP:  [5 cmH20] 5 cmH20 Plateau Pressure:  [19 cmH20-24 cmH20] 24 cmH20  INTAKE / OUTPUT: I/O last 3 completed shifts: In: 7204.9 [I.V.:5644.9; NG/GT:460; IV Piggyback:1100] Out: 8786 [place]ira.Parchment; Emesis/NG output:900]  PHYSICAL EXAMINATION: General: young adult male in NAD on vent HEENT: MM pink/moist, ETT Neuro: awake, alert, appropriate, follows commands on low dose fent gtt  CV: s1s2 rrr, no m/r/g PULM: even/non-labored on PS 5/5, lungs bilaterally clear  GI: protuberant, soft, midline dressing c/d/i, tender, mildly distended  Extremities: warm/dry, no edema  Skin: no rashes or  lesions  LABS:  BMET Recent Labs  Lab 05/15/17 2353 05/16/17 0601 05/17/17 0346  NA 136 137 137  K 3.9 3.9 3.9  CL 106 107 108  CO2 25 27 24   BUN 11 11 11   CREATININE 1.00 1.01 1.14  GLUCOSE 147* 132* 127*    Electrolytes Recent Labs  Lab 05/15/17 1230 05/15/17 1926 05/15/17 2353 05/16/17 0601 05/17/17 0346  CALCIUM 7.4*  --  7.3* 7.3* 7.1*  MG 1.5*  --   --  2.2  --   PHOS  --  2.7  --  2.3*  --     CBC Recent Labs  Lab 05/15/17 1230  05/16/17 0601 05/16/17 1840 05/17/17 0346  WBC 11.6*  --  7.4  --  8.7  HGB 11.6*   < > 9.9* 9.7* 8.8*  HCT 34.7*   < > 29.5* 29.4* 26.3*  PLT 149*  --  120*  --  121*   < > = values in this interval not displayed.    Coag's Recent Labs  Lab 05/15/17 0031 05/15/17 1230 05/15/17 2353  APTT 23* 28 134*  INR 0.97 1.19 1.24    Sepsis Markers Recent Labs  Lab 05/15/17 1944 05/16/17 0025 05/16/17 0558  LATICACIDVEN 1.9 1.7 1.1    ABG Recent Labs  Lab 05/15/17 0718 05/15/17 1330 05/16/17 0420  PHART 7.350 7.343* 7.374  PCO2ART 43.4 48.2* 46.0  PO2ART 175.0* 88.0 120*  Liver Enzymes Recent Labs  Lab 05/14/17 2126 05/16/17 0601  AST 41 36  ALT 34 18  ALKPHOS 47 22*  BILITOT 0.9 0.6  ALBUMIN 4.4 2.9*    Cardiac Enzymes No results for input(s): TROPONINI, PROBNP in the last 168 hours.  Glucose Recent Labs  Lab 05/16/17 1150 05/16/17 1525 05/16/17 2038 05/16/17 2343 05/17/17 0404 05/17/17 0807  GLUCAP 117* 106* 110* 109* 121* 107*    Imaging Dg Chest Port 1 View  Result Date: 05/17/2017 CLINICAL DATA:  Endotracheal tube.  Postop abdominal surgery EXAM: PORTABLE CHEST 1 VIEW COMPARISON:  05/16/2017 FINDINGS: Endotracheal tube in good position.  NG tube in the stomach Decreased lung volume since the prior study. Progression of bibasilar atelectasis. Negative for edema or effusion IMPRESSION: Hypoventilation with worsening bibasilar atelectasis. Electronically Signed   By: Franchot Gallo M.D.    On: 05/17/2017 07:47   STUDIES:  CT Abdomen (11/16):  1. Irregular filling defect within the proximal superior mesenteric artery, concerning for thrombus. Vascular surgery consultation recommended. Correlation with serum lactate suggested. No CT findings to suggest acute bowel ischemia at this time. 2. No other acute intra-abdominal or pelvic process. 3. 3 mm nonobstructive right renal nephrolithiasis. 4. Sequelae of prior prostatectomy.  ANTIBIOTICS: Ertapenem 11/17 x1 (OR) Cefuroxime 11/17 x1 (OR)  SIGNIFICANT EVENTS: 11/16  Presented to [facility] with abd pain >> appears to be SMA clot per Abdominal CT 11/17  Tx to [facility] & underwent emergent surgery for SMA dissection with clot >> surgically repaired, 2L EBL > to ICU  LINES/TUBES: 18G PIV, 22G PIV Left Radial Aline 11/17 >> Foley catheter 11/17 >> ETT 11/17 >>  ASSESSMENT / PLAN: [name] with hx HTN, OSA, Prostate cancer s/p resection, who presented to the [facility] ER on 11/16 PM c/o sudden onset excruciating mid-abdominal pain that began 11/16 at 8pm. CT showed questionable SMA clot. Patient started on heparin and transferred emergently to [facility] where he was brought to the OR and mesenteric angiogram showed it to actually be an SMA dissection, with multiple false lumens and with clot within it. Dr Bridgett Larsson says the OR case was long and complicated with approx 2 LITERS of blood loss. Postop keep intubated.  PULMONARY A: Acute Hypoxic Respiratory failure OSA P: Extubate  Pulmonary hygiene - IS, flutter, OOB  qhs CPAP    CARDIOVASCULAR A: SMA Dissection and SMA Clot s/p Surgical Repair Hemorrhagic Shock - resolved.  Off pressors.  Hx HTN P: Goal SBP < 120 PRN labetalol, hydralazine for SBP goals Heparin gtt per pharmacy  Will need work up for possible Marfan's or [employer] given unusual SMA dissection w/o aortic dissection in a young patient, even with chronically uncontrolled  HTN   RENAL A: AKI Hypokalemia Hypophosphatemia P: Trend BMP / urinary output Replace electrolytes as indicated Avoid nephrotoxic agents, ensure adequate renal perfusion D5LR @ 75 ml/hr   GASTROINTESTINAL A: At Risk Malnutrition  P: NPO post extubation for now  Pepcid for stress ulcer prophylaxis    HEMATOLOGIC A: Acute Blood Loss - in setting of SMA dissection, s/p surgical repair  Prostate cancer s/p resection P: Trend CBC   INFECTIOUS A: Post-OP SMA repair  P: Monitor fever curve / WBC trend   ENDOCRINE A: At Risk Hypo/Hyperglycemia  P: Monitor glucose on BMP  NEUROLOGIC A: Acute Encephalopathy  Former [employer] conservation officer / Family suspected PTSD with prior ETOH (remote) use  P: Pain management per VVS - morphine PCA ordered    If looks good post extubation PCCM will  likely sign off, be available PRN.    FAMILY  Girlfriend updated 11/19 at length at bedside.    - Inter-disciplinary family meet or Palliative Care meeting due by: 05/22/17  CC Time: 30 minutes    Nickolas Madrid, NP 05/17/2017  9:47 AM Pager: [phone] or [phone]  Attending Note:  46 year old male s/p SMA rupture who was intubated for procedure and left intubated post.  VVS indicates no further need for any more surgeries.  Patient is awake and interactive, moving all ext to command.  Weaning very well with clear lungs.  I reviewed CXR myself, ETT ok, no acute disease noted.  Will proceed with extubation today.  IS and flutter valve.  PT and OT to evaluate.  No need for swallow evaluation.  Diet if ok with surgery.  The patient is critically ill with multiple organ systems failure and requires high complexity decision making for assessment and support, frequent evaluation and titration of therapies, application of advanced monitoring technologies and extensive interpretation of multiple databases.   Critical Care Time devoted to patient care services described in this note is  35   Minutes. This time reflects time of care of this signee Dr Jennet Maduro. This critical care time does not reflect procedure time, or teaching time or supervisory time of PA/NP/Med student/Med Resident etc but could involve care discussion time.  Rush Farmer, M.D. [facility] Pulmonary/Critical Care Medicine. Pager: [phone]. After hours pager: [phone].

## 2017-05-17 NOTE — Evaluation (Signed)
Physical Therapy Evaluation Patient Details Name: Marcus Pruitt MRN: [id number] DOB: [birth year] Today's Date: 05/17/2017   History of Present Illness  Pt is a 46 y.o. male who presented to the [facility] ER on 05/14/17 with c/o sudden onset excruciating mid-abdominal pain; CT showed questionable SMA clot. Pt started on heparin and transferred emergently to [facility] where he was brought to the OR and mesenteric angiogram showed it to actually be an SMA dissection, with multiple false lumens and with clot within it. Intubated 11/16-11/19. Pertinent PMH includes HTN, OSA, prostate cancer s/p resection, LBP, R ear hearing loss.    Clinical Impression  Pt presents with pain and an overall decrease in functional mobility secondary to above. PTA, pt indep with all mobility; plans to stay with girlfriend who will be available for assistat d/c. Despite significant pain, pt moving well overall. Able to take steps from bed to chair with minA (+2 safety/line management), hugging pillow throughout secondary to pain. Feel that once pain is under control, pt will progress well and should not require follow-up PT services. Will continue to follow acutely and adjust recs if needed.     Follow Up Recommendations No PT follow up;Supervision - Intermittent    Equipment Recommendations  Other (comment)(TBD)    Recommendations for Other Services OT consult     Precautions / Restrictions Precautions Precautions: Fall;Other (comment) Precaution Comments: Multiple lines Restrictions Weight Bearing Restrictions: No      Mobility  Bed Mobility Overal bed mobility: Needs Assistance Bed Mobility: Rolling;Sidelying to Sit Rolling: Mod assist;+2 for safety/equipment Sidelying to sit: Mod assist;+2 for safety/equipment       General bed mobility comments: ModA to assist trunk elevation and scoot hips secondary to abdominal pain; good technique with log roll while hugging pillow  Transfers Overall transfer level: Needs  assistance Equipment used: None Transfers: Sit to/from Stand Sit to Stand: +2 safety/equipment;Min assist         General transfer comment: MinA+2 to stand while hugging pillow, reliant on momentum to power into standing  Ambulation/Gait Ambulation/Gait assistance: Min assist;+2 safety/equipment Ambulation Distance (Feet): 3 Feet Assistive device: None Gait Pattern/deviations: Step-to pattern;Antalgic;Trunk flexed     General Gait Details: Able to take side steps at EOB and to chair with minA+2 for safety; pt hugging pillow throughout  Stairs            Wheelchair Mobility    Modified Rankin (Stroke Patients Only)       Balance Overall balance assessment: Needs assistance   Sitting balance-Leahy Scale: Fair       Standing balance-Leahy Scale: Fair                               Pertinent Vitals/Pain Pain Assessment: Faces Faces Pain Scale: Hurts whole lot Pain Location: Abdomen Pain Descriptors / Indicators: Sore;Grimacing;Moaning Pain Intervention(s): Limited activity within patient's tolerance;Monitored during session;PCA encouraged    Home Living Family/patient expects to be discharged to:: Private residence Living Arrangements: Spouse/significant other Available Help at Discharge: Friend(s);Available 24 hours/day(Girlfriend) Type of Home: Apartment Home Access: Level entry     Home Layout: One level Home Equipment: None      Prior Function Level of Independence: Independent               Hand Dominance        Extremity/Trunk Assessment   Upper Extremity Assessment Upper Extremity Assessment: Overall WFL for tasks assessed    Lower Extremity  Assessment Lower Extremity Assessment: Overall WFL for tasks assessed       Communication   Communication: No difficulties  Cognition Arousal/Alertness: Awake/alert Behavior During Therapy: WFL for tasks assessed/performed Overall Cognitive Status: Within Functional Limits  for tasks assessed                                        General Comments General comments (skin integrity, edema, etc.): Family/friends present during session    Exercises     Assessment/Plan    PT Assessment Patient needs continued PT services  PT Problem List Decreased activity tolerance;Decreased balance;Decreased mobility;Decreased knowledge of use of DME;Cardiopulmonary status limiting activity;Pain       PT Treatment Interventions DME instruction;Gait training;Stair training;Functional mobility training;Therapeutic activities;Therapeutic exercise;Balance training;Patient/family education    PT Goals (Current goals can be found in the Care Plan section)  Acute Rehab PT Goals Patient Stated Goal: Decreased pain PT Goal Formulation: With patient Time For Goal Achievement: 05/31/17 Potential to Achieve Goals: Good    Frequency Min 3X/week   Barriers to discharge        Co-evaluation PT/OT/SLP Co-Evaluation/Treatment: Yes Reason for Co-Treatment: For patient/therapist safety;To address functional/ADL transfers PT goals addressed during session: Mobility/safety with mobility         AM-PAC PT "6 Clicks" Daily Activity  Outcome Measure Difficulty turning over in bed (including adjusting bedclothes, sheets and blankets)?: Unable Difficulty moving from lying on back to sitting on the side of the bed? : Unable Difficulty sitting down on and standing up from a chair with arms (e.g., wheelchair, bedside commode, etc,.)?: Unable Help needed moving to and from a bed to chair (including a wheelchair)?: A Little Help needed walking in hospital room?: A Little Help needed climbing 3-5 steps with a railing? : A Lot 6 Click Score: 11    End of Session Equipment Utilized During Treatment: Gait belt;Oxygen Activity Tolerance: Patient tolerated treatment well;Patient limited by pain Patient left: in chair;with call bell/phone within reach;with family/visitor  present Nurse Communication: Mobility status PT Visit Diagnosis: Other abnormalities of gait and mobility (R26.89);Pain Pain - part of body: (Incision site)    Time: 4008-6761 PT Time Calculation (min) (ACUTE ONLY): 34 min   Charges:   PT Evaluation $PT Eval Moderate Complexity: 1 Mod     PT G Codes:       Mabeline Caras, PT, DPT Acute Rehab Services  Pager: [place] 05/17/2017, 4:05 PM

## 2017-05-17 NOTE — Progress Notes (Signed)
30 Mm/hg discrepantly in aline SBP and cuff. Paged vascular to clarify BP orders. For med's.

## 2017-05-17 NOTE — Progress Notes (Signed)
OT Cancellation Note  Patient Details Name: Marcus Pruitt MRN: [id number] DOB: 1970/09/25   Cancelled Treatment:    Reason Eval/Treat Not Completed: Patient not medically ready. Pt intubated and on continuous pressors. Will hold OT evaluation at this time and check back next date.   Norman Herrlich, MS OTR/L  Pager: [place] A [name] 05/17/2017, 7:40 AM

## 2017-05-17 NOTE — Evaluation (Signed)
Occupational Therapy Evaluation Patient Details Name: Marcus Pruitt MRN: [id number] DOB: 04-15-1971 Today's Date: 05/17/2017    History of Present Illness Pt is a 46 y.o. male who presented to the [facility] ER on 05/14/17 with c/o sudden onset excruciating mid-abdominal pain; CT showed questionable SMA clot. Pt started on heparin and transferred emergently to [facility] where he was brought to the OR and mesenteric angiogram showed it to actually be an SMA dissection, with multiple false lumens and with clot within it. Intubated 11/16-11/19. Pertinent PMH includes HTN, OSA, prostate cancer s/p resection, LBP, R ear hearing loss.   Clinical Impression   PTA, pt was independent with ADL and functional mobility. Pt currently limited by abdominal pain but motivated to participate with therapy. He required mod assist +2 for bed mobility in preparation for ADL seated at EOB, min assist +2 for stand-pivot toilet transfers, and total assist for toileting hygiene and LB ADL secondary to pain. He would benefit from continued OT services while admitted to improve independence and safety with ADL and functional mobility. Feel that pt will progress well once pain is under control and do not anticipate need for further OT post-acute D/C. However, will continue to update D/C recommendation as necessary.     Follow Up Recommendations  No OT follow up;Supervision/Assistance - 24 hour    Equipment Recommendations  3 in 1 bedside commode    Recommendations for Other Services       Precautions / Restrictions Precautions Precautions: Fall;Other (comment) Precaution Comments: Multiple lines Restrictions Weight Bearing Restrictions: No      Mobility Bed Mobility Overal bed mobility: Needs Assistance Bed Mobility: Rolling;Sidelying to Sit Rolling: Mod assist;+2 for safety/equipment Sidelying to sit: Mod assist;+2 for safety/equipment       General bed mobility comments: Mod assist to assist with trunk elevation and  scoot hips secondary to abdominal pain; good technique with log roll while hugging pillow  Transfers Overall transfer level: Needs assistance Equipment used: None;1 person hand held assist;2 person hand held assist Transfers: Sit to/from Stand Sit to Stand: +2 safety/equipment;Min assist         General transfer comment: MinA+2 to stand while hugging pillow, reliant on momentum to power into standing    Balance Overall balance assessment: Needs assistance Sitting-balance support: No upper extremity supported;Feet supported Sitting balance-Leahy Scale: Fair     Standing balance support: Single extremity supported;No upper extremity supported;During functional activity Standing balance-Leahy Scale: Fair Standing balance comment: Able to statically stand without UE support.                            ADL either performed or assessed with clinical judgement   ADL Overall ADL's : Needs assistance/impaired Eating/Feeding: NPO   Grooming: Minimal assistance;Sitting   Upper Body Bathing: Moderate assistance;Sitting   Lower Body Bathing: Total assistance;Sit to/from stand   Upper Body Dressing : Moderate assistance;Sitting   Lower Body Dressing: Total assistance;Sit to/from stand   Toilet Transfer: BSC;Stand-pivot;Minimal assistance;+2 for safety/equipment Toilet Transfer Details (indicate cue type and reason): Pt did take a few lateral steps along HOB to improve chair positioning.  Toileting- Water quality scientist and Hygiene: Sit to/from stand;Total assistance       Functional mobility during ADLs: Minimal assistance;+2 for safety/equipment General ADL Comments: Pt's greatest limitation is pain. Feel he will improve quickly once pain under control.      Vision Patient Visual Report: No change from baseline Vision Assessment?: No apparent visual  deficits     Perception     Praxis      Pertinent Vitals/Pain Pain Assessment: Faces Faces Pain Scale: Hurts  whole lot Pain Location: Abdomen Pain Descriptors / Indicators: Sore;Grimacing;Moaning Pain Intervention(s): Limited activity within patient's tolerance;Monitored during session     Hand Dominance     Extremity/Trunk Assessment Upper Extremity Assessment Upper Extremity Assessment: Overall WFL for tasks assessed   Lower Extremity Assessment Lower Extremity Assessment: Overall WFL for tasks assessed       Communication Communication Communication: No difficulties   Cognition Arousal/Alertness: Awake/alert Behavior During Therapy: WFL for tasks assessed/performed Overall Cognitive Status: Within Functional Limits for tasks assessed                                     General Comments  VSS stable. Multiple family members and friends present with pt permission.     Exercises     Shoulder Instructions      Home Living Family/patient expects to be discharged to:: Private residence Living Arrangements: Spouse/significant other Available Help at Discharge: Friend(s);Available 24 hours/day(girlfriend) Type of Home: Apartment Home Access: Level entry     Home Layout: One level     Bathroom Shower/Tub: Tub/shower unit         Home Equipment: None          Prior Functioning/Environment Level of Independence: Independent                 OT Problem List: Decreased activity tolerance;Impaired balance (sitting and/or standing);Decreased safety awareness;Decreased knowledge of use of DME or AE;Decreased knowledge of precautions;Pain      OT Treatment/Interventions: Self-care/ADL training;Therapeutic exercise;Energy conservation;DME and/or AE instruction;Therapeutic activities;Patient/family education;Balance training;Visual/perceptual remediation/compensation    OT Goals(Current goals can be found in the care plan section) Acute Rehab OT Goals Patient Stated Goal: Decreased pain OT Goal Formulation: With patient/family Time For Goal Achievement:  05/31/17 Potential to Achieve Goals: Good ADL Goals Pt Will Perform Lower Body Dressing: with min guard assist;sit to/from stand;with adaptive equipment(with or without AE) Pt Will Transfer to Toilet: with min guard assist;ambulating;bedside commode(BSC over toilet) Pt Will Perform Toileting - Clothing Manipulation and hygiene: with min guard assist;sit to/from stand Pt Will Perform Tub/Shower Transfer: with min assist;Tub transfer;3 in 1;ambulating  OT Frequency: Min 2X/week   Barriers to D/C:            Co-evaluation PT/OT/SLP Co-Evaluation/Treatment: Yes Reason for Co-Treatment: For patient/therapist safety PT goals addressed during session: Mobility/safety with mobility OT goals addressed during session: ADL's and self-care      AM-PAC PT "6 Clicks" Daily Activity     Outcome Measure Help from another person eating meals?: Total Help from another person taking care of personal grooming?: A Little Help from another person toileting, which includes using toliet, bedpan, or urinal?: Total Help from another person bathing (including washing, rinsing, drying)?: A Lot Help from another person to put on and taking off regular upper body clothing?: A Little Help from another person to put on and taking off regular lower body clothing?: Total 6 Click Score: 11   End of Session Equipment Utilized During Treatment: Gait belt;Oxygen Nurse Communication: Mobility status(BP cuff not inflating)  Activity Tolerance: Patient tolerated treatment well Patient left: in chair;with call bell/phone within reach;with family/visitor present  OT Visit Diagnosis: Pain;Other abnormalities of gait and mobility (R26.89) Pain - Right/Left: (abdomen) Pain - part of body: (abdomen)  Time: 7989-2119 OT Time Calculation (min): 31 min Charges:  OT General Charges $OT Visit: 1 Visit OT Evaluation $OT Eval Moderate Complexity: 1 Mod G-Codes:     Norman Herrlich, MS OTR/L  Pager:  [place] A [place] 05/17/2017, 5:16 PM

## 2017-05-17 NOTE — Progress Notes (Signed)
ANTICOAGULATION CONSULT NOTE   Pharmacy Consult for IV heparin Indication: superior mesenteric vein thrombus/VTE  No Known Allergies  Patient Measurements: Height: 5\' 6"  (167.6 cm) Weight: 223 lb 8.7 oz (101.4 kg) IBW/kg (Calculated) : 63.8 Heparin Dosing Weight: 74 kg  Vital Signs: Temp: 100.2 F (37.9 C) (11/19 1123) Temp Source: Oral (11/19 1123) BP: 131/93 (11/19 1051) Pulse Rate: 104 (11/19 1051)  Labs: Recent Labs    05/15/17 0031  05/15/17 1230 05/15/17 1950 05/15/17 2353 05/16/17 0600 05/16/17 0601 05/16/17 1840 05/17/17 0346  HGB  --    < > 11.6*  --  10.3*  --  9.9* 9.7* 8.8*  HCT  --    < > 34.7*  --  31.1*  --  29.5* 29.4* 26.3*  PLT  --   --  149*  --   --   --  120*  --  121*  APTT 23*  --  28  --  134*  --   --   --   --   LABPROT 12.8  --  15.0  --  15.5*  --   --   --   --   INR 0.97  --  1.19  --  1.24  --   --   --   --   HEPARINUNFRC  --   --   --  0.34  --  0.43  --   --  0.32  CREATININE  --   --  1.16  --  1.00  --  1.01  --  1.14   < > = values in this interval not displayed.    Estimated Creatinine Clearance: 90.2 mL/min (by C-G formula based on SCr of 1.14 mg/dL).  Assessment: 48 yoM c/o sudden excruciating mid abdominal pain found to have superior mesenteric artery thrombus. Heparin reordered post-op.   Heparin level is at goal this morning on 1200 units/hr. Hbg down from 15 on admit to stable in 9s this am, vascular does not believe patient is bleeding more so ABLA, heparin to remain and follow cbc closely.   Goal of Therapy:  Heparin level 0.3-0.7 units/ml Monitor platelets by anticoagulation protocol: Yes   Plan:  Continue heparin gtt 1200 units/hr Daily heparin level and CBC  Erin Hearing PharmD., BCPS Clinical Pharmacist Pager [phone] 05/17/2017 12:01 PM

## 2017-05-17 NOTE — Progress Notes (Signed)
Sedation is off, patient awake, responding to commands. Wean to extubate this morning,

## 2017-05-18 ENCOUNTER — Inpatient Hospital Stay (HOSPITAL_COMMUNITY): Payer: 59

## 2017-05-18 DIAGNOSIS — R0902 Hypoxemia: Secondary | ICD-10-CM

## 2017-05-18 DIAGNOSIS — I161 Hypertensive emergency: Secondary | ICD-10-CM

## 2017-05-18 DIAGNOSIS — K567 Ileus, unspecified: Secondary | ICD-10-CM

## 2017-05-18 DIAGNOSIS — K9189 Other postprocedural complications and disorders of digestive system: Secondary | ICD-10-CM

## 2017-05-18 LAB — BASIC METABOLIC PANEL
ANION GAP: 7 (ref 5–15)
BUN: 6 mg/dL (ref 6–20)
CO2: 27 mmol/L (ref 22–32)
Calcium: 7.9 mg/dL — ABNORMAL LOW (ref 8.9–10.3)
Chloride: 105 mmol/L (ref 101–111)
Creatinine, Ser: 0.75 mg/dL (ref 0.61–1.24)
GFR calc Af Amer: >60 mL/min (ref >60)
Glucose, Bld: 139 mg/dL — ABNORMAL HIGH (ref 65–99)
POTASSIUM: 3.6 mmol/L (ref 3.5–5.1)
SODIUM: 139 mmol/L (ref 135–145)

## 2017-05-18 LAB — GLUCOSE, CAPILLARY
GLUCOSE-CAPILLARY: 135 mg/dL — AB (ref 65–99)
GLUCOSE-CAPILLARY: 132 mg/dL — AB (ref 65–99)
GLUCOSE-CAPILLARY: 133 mg/dL — AB (ref 65–99)
Glucose-Capillary: 138 mg/dL — ABNORMAL HIGH (ref 65–99)
Glucose-Capillary: 150 mg/dL — ABNORMAL HIGH (ref 65–99)
Glucose-Capillary: 138 mg/dL — ABNORMAL HIGH (ref 65–99)
Glucose-Capillary: 141 mg/dL — ABNORMAL HIGH (ref 65–99)
Glucose-Capillary: 143 mg/dL — ABNORMAL HIGH (ref 65–99)

## 2017-05-18 LAB — CBC
HEMATOCRIT: 25.7 % — AB (ref 39.0–52.0)
Hemoglobin: 8.4 g/dL — ABNORMAL LOW (ref 13.0–17.0)
MCH: 27.4 pg (ref 26.0–34.0)
MCHC: 32.7 g/dL (ref 30.0–36.0)
MCV: 83.7 fL (ref 78.0–100.0)
PLATELETS: 159 10*3/uL (ref 150–400)
RBC: 3.07 MIL/uL — ABNORMAL LOW (ref 4.22–5.81)
RDW: 15.1 % (ref 11.5–15.5)
WBC: 13.3 10*3/uL — AB (ref 4.0–10.5)

## 2017-05-18 LAB — AMYLASE: Amylase: 65 U/L (ref 28–100)

## 2017-05-18 LAB — PHOSPHORUS: Phosphorus: 1.4 mg/dL — ABNORMAL LOW (ref 2.5–4.6)

## 2017-05-18 LAB — MAGNESIUM: MAGNESIUM: 2.2 mg/dL (ref 1.7–2.4)

## 2017-05-18 LAB — LIPASE, BLOOD: LIPASE: 18 U/L (ref 11–51)

## 2017-05-18 LAB — HEPARIN LEVEL (UNFRACTIONATED)
HEPARIN UNFRACTIONATED: 0.28 [IU]/mL — AB (ref 0.30–0.70)
Heparin Unfractionated: 0.2 IU/mL — ABNORMAL LOW (ref 0.30–0.70)
Heparin Unfractionated: 0.26 IU/mL — ABNORMAL LOW (ref 0.30–0.70)

## 2017-05-18 MED ORDER — SODIUM CHLORIDE 0.9% FLUSH
10.0000 mL | INTRAVENOUS | Status: DC | PRN
  Administered 2017-05-20 – 2017-05-27 (×3): 10 mL
  Administered 2017-05-27: 30 mL
  Filled 2017-05-18 (×4): qty 40

## 2017-05-18 MED ORDER — NICARDIPINE HCL IN NACL 20-0.86 MG/200ML-% IV SOLN
3.0000 mg/h | INTRAVENOUS | Status: DC
  Administered 2017-05-18: 5 mg/h via INTRAVENOUS
  Administered 2017-05-18: 15 mg/h via INTRAVENOUS
  Filled 2017-05-18 (×6): qty 200

## 2017-05-18 MED ORDER — INSULIN ASPART 100 UNIT/ML ~~LOC~~ SOLN
0.0000 [IU] | SUBCUTANEOUS | Status: DC
  Administered 2017-05-18 – 2017-05-19 (×2): 1 [IU] via SUBCUTANEOUS
  Administered 2017-05-19: 2 [IU] via SUBCUTANEOUS

## 2017-05-18 MED ORDER — SODIUM CHLORIDE 0.9% FLUSH
10.0000 mL | Freq: Two times a day (BID) | INTRAVENOUS | Status: DC
  Administered 2017-05-18: 30 mL
  Administered 2017-05-20 – 2017-05-27 (×9): 10 mL

## 2017-05-18 MED ORDER — POTASSIUM CHLORIDE 10 MEQ/50ML IV SOLN
10.0000 meq | INTRAVENOUS | Status: DC
Start: 2017-05-18 — End: 2017-05-18

## 2017-05-18 MED ORDER — HYDRALAZINE HCL 20 MG/ML IJ SOLN
10.0000 mg | INTRAMUSCULAR | Status: DC | PRN
  Administered 2017-05-18 – 2017-05-20 (×5): 10 mg via INTRAVENOUS
  Filled 2017-05-18 (×5): qty 1

## 2017-05-18 MED ORDER — DEXTROSE IN LACTATED RINGERS 5 % IV SOLN: INTRAVENOUS | Status: AC

## 2017-05-18 MED ORDER — TRAVASOL 10 % IV SOLN
INTRAVENOUS | Status: AC
  Administered 2017-05-18: 18:00:00 via INTRAVENOUS
  Filled 2017-05-18: qty 499.2

## 2017-05-18 MED ORDER — CHLORHEXIDINE GLUCONATE CLOTH 2 % EX PADS
6.0000 | MEDICATED_PAD | Freq: Every day | CUTANEOUS | Status: DC
  Administered 2017-05-19 – 2017-05-23 (×5): 6 via TOPICAL

## 2017-05-18 MED ORDER — DEXTROSE 5 % IV SOLN
30.0000 mmol | Freq: Once | INTRAVENOUS | Status: AC
  Administered 2017-05-18: 30 mmol via INTRAVENOUS
  Filled 2017-05-18: qty 10

## 2017-05-18 MED ORDER — HYDROMORPHONE 1 MG/ML IV SOLN
INTRAVENOUS | Status: DC
  Administered 2017-05-18: 21:00:00 via INTRAVENOUS
  Administered 2017-05-19: 2.8 mg via INTRAVENOUS
  Administered 2017-05-19 (×2): 2.4 mg via INTRAVENOUS
  Administered 2017-05-20: 13:00:00 via INTRAVENOUS
  Administered 2017-05-20: 1.8 mg via INTRAVENOUS
  Administered 2017-05-20: 3.7 mg via INTRAVENOUS
  Administered 2017-05-20: 1.6 mg via INTRAVENOUS
  Administered 2017-05-21: 2.2 mg via INTRAVENOUS
  Administered 2017-05-21: 2.4 mg via INTRAVENOUS
  Administered 2017-05-21: 22:00:00 via INTRAVENOUS
  Administered 2017-05-21: 1 mg via INTRAVENOUS
  Administered 2017-05-21: 3 mg via INTRAVENOUS
  Administered 2017-05-21: 2.6 mg via INTRAVENOUS
  Administered 2017-05-21: 2.8 mg via INTRAVENOUS
  Administered 2017-05-22: 2 mg via INTRAVENOUS
  Administered 2017-05-22: 3 mg via INTRAVENOUS
  Administered 2017-05-22: 0.8 mg via INTRAVENOUS
  Administered 2017-05-22: 3 mg via INTRAVENOUS
  Administered 2017-05-22: 2 mg via INTRAVENOUS
  Administered 2017-05-22: 2.2 mg via INTRAVENOUS
  Administered 2017-05-23: 1.8 mg via INTRAVENOUS
  Administered 2017-05-23: 1.6 mg via INTRAVENOUS
  Administered 2017-05-23: 1.2 mg via INTRAVENOUS
  Administered 2017-05-23: 2 mg via INTRAVENOUS
  Administered 2017-05-23: 2.2 mg via INTRAVENOUS
  Administered 2017-05-23: 17:00:00 via INTRAVENOUS
  Administered 2017-05-23: 2 mg via INTRAVENOUS
  Administered 2017-05-24: 1.8 mg via INTRAVENOUS
  Administered 2017-05-24: 1.2 mg via INTRAVENOUS
  Administered 2017-05-24: 1 mg via INTRAVENOUS
  Administered 2017-05-24: 1.8 mg via INTRAVENOUS
  Administered 2017-05-24 (×2): 0.4 mg via INTRAVENOUS
  Administered 2017-05-25 (×2): 1 mg via INTRAVENOUS
  Administered 2017-05-25: 1.2 mg via INTRAVENOUS
  Administered 2017-05-25: 2.2 mg via INTRAVENOUS
  Administered 2017-05-25: 1.8 mg via INTRAVENOUS
  Administered 2017-05-25: 0.4 mg via INTRAVENOUS
  Administered 2017-05-26: 0.6 mg via INTRAVENOUS
  Administered 2017-05-26: 0.4 mg via INTRAVENOUS
  Filled 2017-05-18 (×4): qty 25

## 2017-05-18 MED ORDER — NICARDIPINE HCL IN NACL 40-0.83 MG/200ML-% IV SOLN
3.0000 mg/h | INTRAVENOUS | Status: DC
  Administered 2017-05-18: 15 mg/h via INTRAVENOUS
  Administered 2017-05-18 (×2): 8 mg/h via INTRAVENOUS
  Administered 2017-05-18: 11.5 mg/h via INTRAVENOUS
  Administered 2017-05-19: 3 mg/h via INTRAVENOUS
  Filled 2017-05-18 (×6): qty 200

## 2017-05-18 MED ORDER — POTASSIUM CHLORIDE 10 MEQ/50ML IV SOLN: 10.0000 meq | INTRAVENOUS | Status: DC

## 2017-05-18 MED ORDER — SIMETHICONE 40 MG/0.6ML PO SUSP
80.0000 mg | Freq: Four times a day (QID) | ORAL | Status: DC | PRN
  Administered 2017-05-18: 80 mg
  Filled 2017-05-18 (×2): qty 1.2

## 2017-05-18 NOTE — Progress Notes (Signed)
[place] Progress Note Patient Name: Marcus Pruitt DOB: 1971/05/20 MRN: [id number]   Date of Service  05/18/2017  HPI/Events of Note  Patient c/o abdominal pain. KUB with dilated small bowel loops. Pain not relieved by Morphine PCA. More narcotics may make his ileus worse.   eICU Interventions  Will order: Mylicon 80 mg per tube now and Q 6 hours PRN.      Intervention Category Major Interventions: Other:  Sommer,Steven Cornelia C[place] 05/18/2017, 1:31 AM

## 2017-05-18 NOTE — Progress Notes (Signed)
PHARMACY - ADULT TOTAL PARENTERAL NUTRITION CONSULT NOTE   Pharmacy Consult for TPN Indication: Prolonged Ileus  Patient Measurements: Height: 5\' 6"  (167.6 cm) Weight: 229 lb 15 oz (104.3 kg) IBW/kg (Calculated) : 63.8 TPN AdjBW (KG): 72.3 Body mass index is 37.11 kg/m.  Assessment:  46 yo M presents on 11/17 with abdominal pain. Also reported nausea and emesis. PMH of cancer, HTN, and low back pain. CT was ordered and found SMA embolism. 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 Has been NPO since admit, trialed TFs and unable to titrate due to abdominal distension and no bowel sounds. Pharmacy consulted to start TPN due to prolonged ileus.  GI: Prolonged ileus s/p OR on 11/18. Failed tube feed trials with abdominal distension and no bowel sounds. Albumin slightly low at 2.9. Pepcid in TPN Endo: CBGs controlled (120-150s) Insulin requirements in the past 24 hours: 0 units Lytes: wnl today exc Phos low at 1.4. CoCa ok at 8.5. Renal: SCr stable. BUN wnl. D5LR at 1ml/hr Pulm: 3L of [place]  Cards: BP mostly controlled, HR tachy. On nicardipine gtt Hepatobil: LFTs ok on 11/18. Tbili wnl. Neuro: ID: On cefepime. Tmax 102, WBC up a little to 13.3.  Best Practices: Hep gtt TPN Access: PICC ordered for today. IV team aware TPN start date: 11/20 >>  Nutritional Goals (per RD recommendation on 11/19): KCal: 1800-2000 Protein: 90-100 g Fluid: > 1.8 L  Current Nutrition:  NPO  Plan:  Start TPN at 40 ml/hr tonight Hold 20% lipid emulsion for first 7 days for ICU patients per ASPEN guidelines (Start date 11/27) This TPN provides 50 g of protein, 192 g of dextrose, and 0 g of lipids for a total of 852 kcals. Will titrate TPN to goal as tolerated Electrolytes in TPN: standard lytes. Cl:Ac ration 1:1 Add MVI, trace elements, and famotidine to TPN Stop famotidine 20mg  IV Q12h Start sensitive SSI and adjust  as needed Decrease D5LR to KVO when TPN started Monitor TPN labs tomorrow  Give potassium phosphate 24mmol x 1 today  Elenor Quinones, PharmD, [facility] Clinical Pharmacist Pager [phone] 05/18/2017 11:11 AM

## 2017-05-18 NOTE — Progress Notes (Signed)
Dr. Trula Slade notified of patient unable to have appropriate pain control with morphine PCA. Per MD, d/c morphine PCA and order reduced dose dilaudid PCA.  MD also made aware that NG tube is coiled in patients throat. Due to specific MD instructions, NG was not pulled and an abdominal xray was ordered to verify placement. Per xray results, NG tube still resides in stomach. MD said unless the NG tube is bothering patient, leave in for rounding MD to address in am.

## 2017-05-18 NOTE — Progress Notes (Signed)
Pt's NG was noted to be curled up in back of throat. Pts respiratory status is unchanged. Tube feeding stopped and NG tube is clamped. Donzetta Matters MD was called. Verbal orders for abd XRay. Orders read back and verified. Orders placed. Xray at bedside. Pt will be monitored closely.

## 2017-05-18 NOTE — Progress Notes (Signed)
Pt has been hypertensive despite administration of anti-hypertensive PRNs. Pt also complains of abd pain. abd taut with faint bowel sounds.  CCM notified via phone call. Hydralazine order PRN modified. Simethicone ordered. Hydralazine and simethicone given. Pt remains hypertensive. CCM called back. Sommer MD sending ground team to pts room for abd pain. Cardene gtt ordered for hypertension. Cardene gtt started. Pt will be monitored closely.

## 2017-05-18 NOTE — Progress Notes (Signed)
Physical Therapy Treatment Patient Details Name: Marcus Pruitt MRN: [id number] DOB: Nov 10, 1970 Today's Date: 05/18/2017    History of Present Illness Pt is a 45 y.o. male who presented to the [facility] ER on 05/14/17 with c/o sudden onset excruciating mid-abdominal pain; CT showed questionable SMA clot. Pt started on heparin and transferred emergently to [facility] where he was brought to the OR and mesenteric angiogram showed it to actually be an SMA dissection, with multiple false lumens and with clot within it. Intubated 11/16-11/19. Pertinent PMH includes HTN, OSA, prostate cancer s/p resection, LBP, R ear hearing loss.   PT Comments    Pt progressing well with mobility. Able to amb 90' with no AD and intermittent min guard (+2 for line assist); amb an additional 220' with Harmon Pier walker. Pt continues to move very well despite increased pain, in addition to fatigue from walking with nursing staff this morning. Bed mobility much improved this session. Will continue to follow acutely.   Follow Up Recommendations  No PT follow up;Supervision - Intermittent     Equipment Recommendations  Other (comment)(TBD)    Recommendations for Other Services       Precautions / Restrictions Precautions Precautions: Fall;Other (comment) Precaution Comments: Multiple lines Restrictions Weight Bearing Restrictions: No    Mobility  Bed Mobility Overal bed mobility: Needs Assistance Bed Mobility: Rolling;Sidelying to Sit Rolling: Min guard Sidelying to sit: Min assist;+2 for safety/equipment       General bed mobility comments: Pt with good technique rolling while hugging pillow. MinA to assist trunk into elevation (+2 for line control)  Transfers Overall transfer level: Needs assistance Equipment used: None Transfers: Sit to/from Stand Sit to Stand: +2 safety/equipment;Min assist         General transfer comment: MinA+2 to stand while hugging pillow, reliant on momentum to power into  standing  Ambulation/Gait Ambulation/Gait assistance: Min guard;+2 safety/equipment Ambulation Distance (Feet): 280 Feet Assistive device: None;4-wheeled walker(Eva walker) Gait Pattern/deviations: Step-to pattern;Step-through pattern;Decreased stride length;Trunk flexed Gait velocity: Decreased Gait velocity interpretation: <1.8 ft/sec, indicative of risk for recurrent falls General Gait Details: Able to amb 96' with no UE support and min guard (+2 lines). Amb an additional ~220' with BUE support on Eva walker. Intermittent cues to maintain upright posture as pt tends to flex forward into walker. 2x standing rest breaks secondary to fatigue   Stairs            Wheelchair Mobility    Modified Rankin (Stroke Patients Only)       Balance Overall balance assessment: Needs assistance Sitting-balance support: No upper extremity supported;Feet supported Sitting balance-Leahy Scale: Good     Standing balance support: Single extremity supported;No upper extremity supported;During functional activity Standing balance-Leahy Scale: Fair                              Cognition Arousal/Alertness: Awake/alert Behavior During Therapy: WFL for tasks assessed/performed Overall Cognitive Status: Within Functional Limits for tasks assessed                                        Exercises      General Comments General comments (skin integrity, edema, etc.): VSS stable throughout      Pertinent Vitals/Pain Pain Assessment: 0-10 Pain Score: 5  Pain Location: Abdomen Pain Descriptors / Indicators: Sore;Grimacing Pain Intervention(s): Limited activity within patient's tolerance;Monitored  during session    Home Living                      Prior Function            PT Goals (current goals can now be found in the care plan section) Acute Rehab PT Goals Patient Stated Goal: Decreased pain PT Goal Formulation: With patient Time For Goal  Achievement: 05/31/17 Potential to Achieve Goals: Good Progress towards PT goals: Progressing toward goals    Frequency    Min 3X/week      PT Plan Current plan remains appropriate    Co-evaluation              AM-PAC PT "6 Clicks" Daily Activity  Outcome Measure  Difficulty turning over in bed (including adjusting bedclothes, sheets and blankets)?: None Difficulty moving from lying on back to sitting on the side of the bed? : Unable Difficulty sitting down on and standing up from a chair with arms (e.g., wheelchair, bedside commode, etc,.)?: A Little Help needed moving to and from a bed to chair (including a wheelchair)?: A Little Help needed walking in hospital room?: A Little Help needed climbing 3-5 steps with a railing? : A Lot 6 Click Score: 16    End of Session Equipment Utilized During Treatment: Gait belt;Oxygen Activity Tolerance: Patient tolerated treatment well;Patient limited by fatigue Patient left: in chair;with call bell/phone within reach Nurse Communication: Mobility status PT Visit Diagnosis: Other abnormalities of gait and mobility (R26.89);Pain Pain - part of body: (Abdomen)     Time: 1343-1430 PT Time Calculation (min) (ACUTE ONLY): 47 min  Charges:  $Gait Training: 23-37 mins $Therapeutic Activity: 8-22 mins                    G Codes:      Mabeline Caras, PT, DPT Acute Rehab Services  Pager: [phone] 05/18/2017, 4:11 PM

## 2017-05-18 NOTE — Progress Notes (Signed)
Pt refused CPAP for the night because of NG tube and PC pump. Vitals are stable RT to cont to monitor.

## 2017-05-18 NOTE — Progress Notes (Signed)
Patient refusing the use of CPAP for tonight due to NG tube in place. Patient states it is uncomfortable.

## 2017-05-18 NOTE — Plan of Care (Signed)
  Progressing Pain Managment: General experience of comfort will improve 05/18/2017 2253 - Progressing by Netta Corrigan, RN Note Pts PCA changed from morphine to diluadid. Pt states that his is experiencing better pain control with new medication

## 2017-05-18 NOTE — Progress Notes (Signed)
ANTICOAGULATION CONSULT NOTE   Pharmacy Consult for IV heparin Indication: superior mesenteric vein thrombus/VTE  No Known Allergies  Patient Measurements: Height: 5\' 6"  (167.6 cm) Weight: 229 lb 15 oz (104.3 kg) IBW/kg (Calculated) : 63.8 Heparin Dosing Weight: 74 kg  Vital Signs: Temp: 99 F (37.2 C) (11/20 1208) Temp Source: Oral (11/20 1208) BP: 140/81 (11/20 1300) Pulse Rate: 123 (11/20 1400)  Labs: Recent Labs    05/15/17 2353  05/16/17 0601 05/16/17 1840 05/17/17 0346 05/18/17 0332 05/18/17 1309  HGB 10.3*  --  9.9* 9.7* 8.8* 8.4*  --   HCT 31.1*  --  29.5* 29.4* 26.3* 25.7*  --   PLT  --   --  120*  --  121* 159  --   APTT 134*  --   --   --   --   --   --   LABPROT 15.5*  --   --   --   --   --   --   INR 1.24  --   --   --   --   --   --   HEPARINUNFRC  --    < >  --   --  0.32 0.20* 0.26*  CREATININE 1.00  --  1.01  --  1.14 0.75  --    < > = values in this interval not displayed.    Estimated Creatinine Clearance: 130.6 mL/min (by C-G formula based on SCr of 0.75 mg/dL).  Assessment: 26 yoM c/o abdominal pain found to have superior mesenteric artery thrombus. Heparin reordered post-op.   Heparin level subtherapeutic at 0.2 again this afternoon without infusion issues per RN. CBC low-stable from previous. No bleeding per RN.   Goal of Therapy:  Heparin level 0.3-0.7 units/ml Monitor platelets by anticoagulation protocol: Yes   Plan:  Increase heparin gtt to 1650 units/hr Heparin level in 6 hrs Daily heparin level and CBC Monitor for s/s bleeding   Erin Hearing PharmD., BCPS Clinical Pharmacist Pager [phone] 05/18/2017 3:51 PM

## 2017-05-18 NOTE — Progress Notes (Signed)
PULMONARY / CRITICAL CARE MEDICINE   Name: Marcus Pruitt MRN: [id number] DOB: February 21, 1971    ADMISSION DATE:  05/14/2017 CONSULTATION DATE:  05/15/17  REFERRING MD: Dr Bridgett Larsson  CHIEF COMPLAINT: SMA dissection; Vent management  HISTORY OF PRESENT ILLNESS:   48yoM with hx HTN, OSA, Prostate cancer s/p resection, who presented to the [facility] ER on 11/16 PM c/o sudden onset excruciating mid-abdominal pain that began 11/16 at 8pm. CT showed questionable SMA clot. Patient started on heparin and transferred emergently to [facility] where he was brought to the OR and mesenteric angiogram showed it to actually be an SMA dissection, with multiple false lumens and with clot within it. Dr Bridgett Larsson reported the OR case was long and complicated with approx 2 LITERS of blood loss. Postop he wants to keep patient intubated while we work to stabilize him, then will readdress vent weaning.    SUBJECTIVE:  Severe pain and HTN overnight, started on a cardene drip and PCA continued, bowel distension and no bowel sounds  VITAL SIGNS: BP 101/72   Pulse 98   Temp 98.6 F (37 C) (Oral)   Resp (!) 26   Ht 5\' 6"  (1.676 m)   Wt 104.3 kg (229 lb 15 oz)   SpO2 93%   BMI 37.11 kg/m    HEMODYNAMICS:    VENTILATOR SETTINGS: FiO2 (%):  [28 %] 28 %  INTAKE / OUTPUT: I/O last 3 completed shifts: In: 5922.3 [I.V.:4492.3; NG/GT:280; IV Piggyback:1150] Out: 2610 [Urine:2060; Emesis/NG output:550]  PHYSICAL EXAMINATION: General: Awake and interactive, visibly in pain HEENT: [place]/AT, PERRL, EOM-I and DMM Neuro: Alert and interactive, moving all ext to command CV: RRR, Nl S1/S2, -M/R/G. PULM: CTA bilaterally GI: Distended, no BS audible, diffusely tender. Extremities: warm/dry, no edema  Skin: no rashes or lesions  LABS:  BMET Recent Labs  Lab 05/16/17 0601 05/17/17 0346 05/18/17 0332  NA 137 137 139  K 3.9 3.9 3.6  CL 107 108 105  CO2 27 24 27   BUN 11 11 6   CREATININE 1.01 1.14 0.75  GLUCOSE 132* 127* 139*    Electrolytes Recent Labs  Lab 05/15/17 1230 05/15/17 1926  05/16/17 0601 05/17/17 0346 05/18/17 0332  CALCIUM 7.4*  --    < > 7.3* 7.1* 7.9*  MG 1.5*  --   --  2.2  --  2.2  PHOS  --  2.7  --  2.3*  --  1.4*   < > = values in this interval not displayed.   CBC Recent Labs  Lab 05/16/17 0601 05/16/17 1840 05/17/17 0346 05/18/17 0332  WBC 7.4  --  8.7 13.3*  HGB 9.9* 9.7* 8.8* 8.4*  HCT 29.5* 29.4* 26.3* 25.7*  PLT 120*  --  121* 159   Coag's Recent Labs  Lab 05/15/17 0031 05/15/17 1230 05/15/17 2353  APTT 23* 28 134*  INR 0.97 1.19 1.24   Sepsis Markers Recent Labs  Lab 05/15/17 1944 05/16/17 0025 05/16/17 0558  LATICACIDVEN 1.9 1.7 1.1   ABG Recent Labs  Lab 05/15/17 1007 05/15/17 1330 05/16/17 0420  PHART 7.368 7.343* 7.374  PCO2ART 39.1 48.2* 46.0  PO2ART 129.0* 88.0 120*   Liver Enzymes Recent Labs  Lab 05/14/17 2126 05/16/17 0601  AST 41 36  ALT 34 18  ALKPHOS 47 22*  BILITOT 0.9 0.6  ALBUMIN 4.4 2.9*   Cardiac Enzymes No results for input(s): TROPONINI, PROBNP in the last 168 hours.  Glucose Recent Labs  Lab 05/17/17 0404 05/17/17 0807 05/17/17 1120 05/17/17 2323  05/18/17 0347 05/18/17 0820  GLUCAP 121* 107* 141* 131* 132* 150*   Imaging Dg Chest Portable 1 View  Result Date: 05/18/2017 CLINICAL DATA:  Respiratory failure EXAM: PORTABLE CHEST 1 VIEW COMPARISON:  Yesterday FINDINGS: Extubation of the trachea. Lung volumes are active slightly improved. Mild atelectatic type opacities. Normal heart size and mediastinal contours for technique. An orogastric tube reaches the stomach. IMPRESSION: Extubation with mildly improved aeration.  Atelectasis at the bases. Electronically Signed   By: Monte Fantasia M.D.   On: 05/18/2017 09:11   Dg Abd Portable 1v  Result Date: 05/18/2017 CLINICAL DATA:  46 year old male status post enteric tube placement. EXAM: PORTABLE ABDOMEN - 1 VIEW COMPARISON:  Abdominal radiograph dated 05/15/2017  FINDINGS: An enteric tube is partially visualized with side-port distal to the gastroesophageal junction and tip in the proximal stomach. A dilated loop of small bowel in the left mid abdomen measures approximately 4.5 cm in diameter. Midline vertical anterior abdominal wall cutaneous surgical clips noted. IMPRESSION: 1. Enteric tube with tip in the proximal stomach. 2. Dilated loop of small bowel in the left mid abdomen measuring 4.5 cm. Electronically Signed   By: Anner Crete M.D.   On: 05/18/2017 00:15   STUDIES:  CT Abdomen (11/16):  1. Irregular filling defect within the proximal superior mesenteric artery, concerning for thrombus. Vascular surgery consultation recommended. Correlation with serum lactate suggested. No CT findings to suggest acute bowel ischemia at this time. 2. No other acute intra-abdominal or pelvic process. 3. 3 mm nonobstructive right renal nephrolithiasis. 4. Sequelae of prior prostatectomy.  ANTIBIOTICS: Ertapenem 11/17 x1 (OR) Cefuroxime 11/17 x1 (OR)  SIGNIFICANT EVENTS: 11/16  Presented to [facility] with abd pain >> appears to be SMA clot per Abdominal CT 11/17  Tx to [facility] & underwent emergent surgery for SMA dissection with clot >> surgically repaired, 2L EBL > to ICU  LINES/TUBES: 18G PIV, 22G PIV Left Radial Aline 11/17 >>11/19 Foley catheter 11/17 >> ETT 11/17 >>11/19 PICC 11/20>>>  ASSESSMENT / PLAN: [name] with hx HTN, OSA, Prostate cancer s/p resection, who presented to the [facility] ER on 11/16 PM c/o sudden onset excruciating mid-abdominal pain that began 11/16 at 8pm. CT showed questionable SMA clot. Patient started on heparin and transferred emergently to [facility] where he was brought to the OR and mesenteric angiogram showed it to actually be an SMA dissection, with multiple false lumens and with clot within it. Dr Bridgett Larsson says the OR case was long and complicated with approx 2 LITERS of blood loss. Postop keep intubated.  PULMONARY A: Acute Hypoxic Respiratory  failure OSA P: Titrate O2 for sat of 88-92% Pulmonary hygiene - IS, flutter, OOB  QHS CPAP  IS and flutter valve  CARDIOVASCULAR A: SMA Dissection and SMA Clot s/p Surgical Repair Hemorrhagic Shock - resolved.  Off pressors.  Hx HTN P: Goal SBP < 120 PRN labetalol, hydralazine for SBP goals Heparin gtt per pharmacy  Will need work up for possible Marfan's or [employer] given unusual SMA dissection w/o aortic dissection in a young patient, even with chronically uncontrolled HTN Cardene drip for BP control as ordered  RENAL A: AKI Hypokalemia Hypophosphatemia P: Trend BMP / urinary output Replace electrolytes as indicated Avoid nephrotoxic agents, ensure adequate renal perfusion KVO IVF after TPN is started  GASTROINTESTINAL A: At Risk Malnutrition  Ileus P: NPO post extubation for now  Pepcid for stress ulcer prophylaxis  TPN for ileus  HEMATOLOGIC A: Acute Blood Loss - in setting of SMA dissection,  s/p surgical repair  Prostate cancer s/p resection P: Trend CBC   INFECTIOUS A: Post-OP SMA repair  P: Monitor fever curve / WBC trend   ENDOCRINE A: At Risk Hypo/Hyperglycemia  P: Monitor glucose on BMP  NEUROLOGIC A: Acute Encephalopathy  Former [employer] conservation officer / Family suspected PTSD with prior ETOH (remote) use  P: PCA morphine  FAMILY  Patient updated bedside, will hold in the ICU given cardene drip and ileus, place PICC line, TPN to be started  - Inter-disciplinary family meet or Palliative Care meeting due by: 05/22/17  The patient is critically ill with multiple organ systems failure and requires high complexity decision making for assessment and support, frequent evaluation and titration of therapies, application of advanced monitoring technologies and extensive interpretation of multiple databases.   Critical Care Time devoted to patient care services described in this note is  35  Minutes. This time reflects time of care of this signee Dr  Jennet Maduro. This critical care time does not reflect procedure time, or teaching time or supervisory time of PA/NP/Med student/Med Resident etc but could involve care discussion time.  Rush Farmer, M.D. [facility] Pulmonary/Critical Care Medicine. Pager: [phone]. After hours pager: [phone].

## 2017-05-18 NOTE — Progress Notes (Signed)
ANTICOAGULATION CONSULT NOTE   Pharmacy Consult for IV heparin Indication: superior mesenteric vein thrombus/VTE  No Known Allergies  Patient Measurements: Height: 5\' 6"  (167.6 cm) Weight: 229 lb 15 oz (104.3 kg) IBW/kg (Calculated) : 63.8 Heparin Dosing Weight: 87 kg  Vital Signs: Temp: 99.1 F (37.3 C) (11/20 1601) Temp Source: Oral (11/20 1601) BP: 141/89 (11/20 2200) Pulse Rate: 110 (11/20 2200)  Labs: Recent Labs    05/15/17 2353  05/16/17 0601 05/16/17 1840 05/17/17 0346 05/18/17 0332 05/18/17 1309 05/18/17 2153  HGB 10.3*  --  9.9* 9.7* 8.8* 8.4*  --   --   HCT 31.1*  --  29.5* 29.4* 26.3* 25.7*  --   --   PLT  --   --  120*  --  121* 159  --   --   APTT 134*  --   --   --   --   --   --   --   LABPROT 15.5*  --   --   --   --   --   --   --   INR 1.24  --   --   --   --   --   --   --   HEPARINUNFRC  --    < >  --   --  0.32 0.20* 0.26* 0.28*  CREATININE 1.00  --  1.01  --  1.14 0.75  --   --    < > = values in this interval not displayed.    Estimated Creatinine Clearance: 130.6 mL/min (by C-G formula based on SCr of 0.75 mg/dL).  Assessment: 38 yoM c/o abdominal pain found to have superior mesenteric artery thrombus. Heparin reordered post-op.   Heparin level this evening remains SUBtherapeutic despite a rate increase earlier today but is trending up (HL 0.28 << 0.26, goal of 0.3-0.7). No issues with the drip or bleeding noted per discussion with RN  Goal of Therapy:  Heparin level 0.3-0.7 units/ml Monitor platelets by anticoagulation protocol: Yes   Plan:  Increase heparin gtt to 1800 units/hr Heparin level in 6 hrs Daily heparin level and CBC Monitor for s/s bleeding   Thank you for allowing pharmacy to be a part of this patient's care.  Alycia Rossetti, PharmD, BCPS Clinical Pharmacist Pager: [phone] If after 3:30p, please call main pharmacy at: [phone] 05/18/2017 10:24 PM

## 2017-05-18 NOTE — Progress Notes (Signed)
[name] Progress Note Patient Name: Marcus Pruitt DOB: [birth year] MRN: [id number]   Date of Service  05/18/2017  HPI/Events of Note  Multiple issues: 1. Hypertension - BP = 186/76 and 2. Patient still c/o abdominal pain.   eICU Interventions  Will order: 1. Nicardipine IV infusion.  2. Amylase. 3. Lipase. 4. Will ask ground team to evaluate abdomen at bedside.      Intervention Category Major Interventions: Hypertension - evaluation and management Intermediate Interventions: Other:  Sommer,Steven Cornelia Copa 05/18/2017, 2:50 AM

## 2017-05-18 NOTE — Progress Notes (Signed)
   VASCULAR SURGERY ASSESSMENT & PLAN:   3 Days Post-Op s/p: Repair of superior mesenteric artery dissection and bovine pericardial patch angioplasty.  He had some abdominal pain last night but this has resolved.  GI/NUTRITION: He was not tolerating his trickle tube feeds.  He has abdominal distention with no bowel sounds and his abdominal x-ray shows a distended stomach and also dilated small bowel.  Continue NG tube.  CARDIAC: Hemodynamically stable.  Not on any pressors.  PULMONARY: He is doing well extubated.  RENAL: He has good urine output and his creatinine is normal.  Appreciate CCM's help.  ID: T-max is 99.  White blood cell count was 13,000.  I started him on cefepime yesterday.  (Day 2)   SUBJECTIVE:   Incisional pain adequately controlled.  No BM no flatus.  PHYSICAL EXAM:   Vitals:   05/18/17 0615 05/18/17 0630 05/18/17 0645 05/18/17 0700  BP:    (!) 143/82  Pulse: (!) 122 (!) 125 (!) 121 (!) 124  Resp: (!) 31 (!) 35 (!) 29 (!) 28  Temp:      TempSrc:      SpO2: 92% 93% 92% 90%  Weight:      Height:       550 cc from NG tube last shift.  Lungs are clear bilaterally to auscultation. Abdomen distended with no bowel sounds. Incision looks fine.  LABS:   Lab Results  Component Value Date   WBC 13.3 (H) 05/18/2017   HGB 8.4 (L) 05/18/2017   HCT 25.7 (L) 05/18/2017   MCV 83.7 05/18/2017   PLT 159 05/18/2017   Lab Results  Component Value Date   CREATININE 0.75 05/18/2017   Lab Results  Component Value Date   INR 1.24 05/15/2017   CBG (last 3)  Recent Labs    05/17/17 1120 05/17/17 2323 05/18/17 0347  GLUCAP 141* 131* 132*    PROBLEM LIST:    Active Problems:   Mesenteric artery vascular embolism (HCC)   Vascular disorder of small intestine (HCC)   Dissection of unspecified artery (HCC)   CURRENT MEDS:   . chlorhexidine  15 mL Mouth Rinse BID  . mouth rinse  15 mL Mouth Rinse q12n4p  . morphine   Intravenous Q4H  . morphine         Deitra Mayo Beeper: [phone] Office: [phone] 05/18/2017

## 2017-05-18 NOTE — Progress Notes (Signed)
Initial Nutrition Assessment  DOCUMENTATION CODES:   Obesity unspecified  INTERVENTION:   TPN advancement per Pharmacy  NUTRITION DIAGNOSIS:   Inadequate oral intake related to inability to eat as evidenced by NPO status  GOAL:   Patient will meet greater than or equal to 90% of their needs  MONITOR:   PO intake, Supplement acceptance, TF tolerance, Diet advancement, Labs, I & O's  REASON FOR ASSESSMENT:   Consult New TPN/TNA  ASSESSMENT:   Pt with PMH significant for HTN, and prostate cancer s/p resection. Presents this admission with with complaints of extreme abdominal pain. CT showed questionable SMA clot, mesenteric angiogram confirmed SMA dissection. Pt was intubated prior to surgery.   11/17- thrombectomy of superior mesenteric artery with patch placement 11/19- extubated, TF d/c 11/20- TPN started  Weight up 13 lb - pt is positive 8 L since admission  NGT remains in place. Tube feeding was discontinued. Pt complaining of abdominal pain, no bowel sounds per MD. NGT output 550 ml x 24 hrs  Medications reviewed  Labs reviewed: Phosphorus 1.4 (L) - being replaced  CBG's: 132-150-138  TPN @ 40 ml/hr (70% dextrose and 10% amino acid) Provides: 852 kcal (47% of needs) and 50 grams protein (47% of protein needs) No lipid first 7 days of ICU   Diet Order:  Diet NPO time specified TPN ADULT (ION)  EDUCATION NEEDS:   Not appropriate for education at this time  Skin:  Skin Assessment: Reviewed RN Assessment  Last BM:  11/17  Height:   Ht Readings from Last 1 Encounters:  05/14/17 5\' 6"  (1.676 m)    Weight:   Wt Readings from Last 1 Encounters:  05/18/17 229 lb 15 oz (104.3 kg)    Ideal Body Weight:  64.5 kg  BMI:  Body mass index is 37.11 kg/m.  Estimated Nutritional Needs:   Kcal:  1800-2000 kcal/day  Protein:  105-120 grams  Fluid:  >1.8 L/day  Maylon Peppers RD, LDN, CNSC [phone] Pager [phone] After Hours Pager

## 2017-05-18 NOTE — Progress Notes (Signed)
ANTICOAGULATION CONSULT NOTE   Pharmacy Consult for IV heparin Indication: superior mesenteric vein thrombus/VTE  No Known Allergies  Patient Measurements: Height: 5\' 6"  (167.6 cm) Weight: 223 lb 8.7 oz (101.4 kg) IBW/kg (Calculated) : 63.8 Heparin Dosing Weight: 74 kg  Vital Signs: Temp: 98.9 F (37.2 C) (11/20 0300) Temp Source: Oral (11/20 0300) BP: 148/117 (11/20 0300) Pulse Rate: 114 (11/20 0300)  Labs: Recent Labs    05/15/17 1230  05/15/17 2353 05/16/17 0600 05/16/17 0601 05/16/17 1840 05/17/17 0346 05/18/17 0332  HGB 11.6*  --  10.3*  --  9.9* 9.7* 8.8* 8.4*  HCT 34.7*  --  31.1*  --  29.5* 29.4* 26.3* 25.7*  PLT 149*  --   --   --  120*  --  121* 159  APTT 28  --  134*  --   --   --   --   --   LABPROT 15.0  --  15.5*  --   --   --   --   --   INR 1.19  --  1.24  --   --   --   --   --   HEPARINUNFRC  --    < >  --  0.43  --   --  0.32 0.20*  CREATININE 1.16  --  1.00  --  1.01  --  1.14  --    < > = values in this interval not displayed.    Estimated Creatinine Clearance: 90.2 mL/min (by C-G formula based on SCr of 1.14 mg/dL).  Assessment: Marcus Pruitt c/o abdominal pain found to have superior mesenteric artery thrombus. Heparin reordered post-op.   Heparin level subtherapeutic at 0.2 and no infusion issues per RN. CBC low-stable from previous. No bleeding per RN.   Goal of Therapy:  Heparin level 0.3-0.7 units/ml Monitor platelets by anticoagulation protocol: Yes   Plan:  Increase heparin gtt to 1450 units/hr Heparin level in 6 hrs Daily heparin level and CBC Monitor for s/s bleeding   Lavonda Jumbo, PharmD Clinical Pharmacist 05/18/17 5:02 AM

## 2017-05-18 NOTE — Progress Notes (Signed)
Peripherally Inserted Central Catheter/Midline Placement  The IV Nurse has discussed with the patient and/or persons authorized to consent for the patient, the purpose of this procedure and the potential benefits and risks involved with this procedure.  The benefits include less needle sticks, lab draws from the catheter, and the patient may be discharged home with the catheter. Risks include, but not limited to, infection, bleeding, blood clot (thrombus formation), and puncture of an artery; nerve damage and irregular heartbeat and possibility to perform a PICC exchange if needed/ordered by physician.  Alternatives to this procedure were also discussed.  Bard Power PICC patient education guide, fact sheet on infection prevention and patient information card has been provided to patient /or left at bedside.    PICC/Midline Placement Documentation  PICC Triple Lumen 93/81/01 PICC Right Basilic 40 cm 0 cm (Active)  Indication for Insertion or Continuance of Line Administration of hyperosmolar/irritating solutions (i.e. TPN, Vancomycin, etc.) 05/18/2017 12:00 PM  Exposed Catheter (cm) 0 cm 05/18/2017 12:00 PM  Site Assessment Clean;Dry;Intact 05/18/2017 12:00 PM  Lumen #1 Status Flushed;Blood return noted 05/18/2017 12:00 PM  Lumen #2 Status Flushed;Blood return noted 05/18/2017 12:00 PM  Lumen #3 Status Flushed;Blood return noted 05/18/2017 12:00 PM  Dressing Type Transparent 05/18/2017 12:00 PM  Dressing Status Clean;Dry;Intact 05/18/2017 12:00 PM  Dressing Intervention New dressing 05/18/2017 12:00 PM  Dressing Change Due 05/25/17 05/18/2017 12:00 PM       Marcus Pruitt 05/18/2017, 12:42 PM

## 2017-05-19 ENCOUNTER — Inpatient Hospital Stay (HOSPITAL_COMMUNITY): Payer: 59

## 2017-05-19 DIAGNOSIS — N184 Chronic kidney disease, stage 4 (severe): Secondary | ICD-10-CM

## 2017-05-19 DIAGNOSIS — K56609 Unspecified intestinal obstruction, unspecified as to partial versus complete obstruction: Secondary | ICD-10-CM

## 2017-05-19 DIAGNOSIS — I10 Essential (primary) hypertension: Secondary | ICD-10-CM

## 2017-05-19 LAB — HEPARIN LEVEL (UNFRACTIONATED)
HEPARIN UNFRACTIONATED: 0.38 [IU]/mL (ref 0.30–0.70)
Heparin Unfractionated: 0.37 IU/mL (ref 0.30–0.70)

## 2017-05-19 LAB — GLUCOSE, CAPILLARY
GLUCOSE-CAPILLARY: 128 mg/dL — AB (ref 65–99)
GLUCOSE-CAPILLARY: 138 mg/dL — AB (ref 65–99)
Glucose-Capillary: 112 mg/dL — ABNORMAL HIGH (ref 65–99)
Glucose-Capillary: 113 mg/dL — ABNORMAL HIGH (ref 65–99)
Glucose-Capillary: 119 mg/dL — ABNORMAL HIGH (ref 65–99)
Glucose-Capillary: 122 mg/dL — ABNORMAL HIGH (ref 65–99)
Glucose-Capillary: 153 mg/dL — ABNORMAL HIGH (ref 65–99)

## 2017-05-19 LAB — CBC
HCT: 21.7 % — ABNORMAL LOW (ref 39.0–52.0)
Hemoglobin: 7.3 g/dL — ABNORMAL LOW (ref 13.0–17.0)
MCH: 27.9 pg (ref 26.0–34.0)
MCHC: 33.6 g/dL (ref 30.0–36.0)
MCV: 82.8 fL (ref 78.0–100.0)
PLATELETS: 215 10*3/uL (ref 150–400)
RBC: 2.62 MIL/uL — ABNORMAL LOW (ref 4.22–5.81)
RDW: 14.8 % (ref 11.5–15.5)
WBC: 11.6 10*3/uL — AB (ref 4.0–10.5)

## 2017-05-19 LAB — BLOOD GAS, ARTERIAL
Acid-Base Excess: 5.4 mmol/L — ABNORMAL HIGH (ref 0.0–2.0)
BICARBONATE: 30 mmol/L — AB (ref 20.0–28.0)
Drawn by: 41977
O2 CONTENT: 5 L/min
O2 SAT: 93.8 %
PATIENT TEMPERATURE: 98.6
PO2 ART: 69.4 mmHg — AB (ref 83.0–108.0)
pCO2 arterial: 49.7 mmHg — ABNORMAL HIGH (ref 32.0–48.0)
pH, Arterial: 7.399 (ref 7.350–7.450)

## 2017-05-19 LAB — COMPREHENSIVE METABOLIC PANEL
ALT: 23 U/L (ref 17–63)
AST: 36 U/L (ref 15–41)
Albumin: 2.3 g/dL — ABNORMAL LOW (ref 3.5–5.0)
Alkaline Phosphatase: 48 U/L (ref 38–126)
Anion gap: 7 (ref 5–15)
BUN: 9 mg/dL (ref 6–20)
CHLORIDE: 103 mmol/L (ref 101–111)
CO2: 28 mmol/L (ref 22–32)
Calcium: 7.9 mg/dL — ABNORMAL LOW (ref 8.9–10.3)
Creatinine, Ser: 0.85 mg/dL (ref 0.61–1.24)
GFR calc Af Amer: >60 mL/min (ref >60)
Glucose, Bld: 139 mg/dL — ABNORMAL HIGH (ref 65–99)
POTASSIUM: 3.3 mmol/L — AB (ref 3.5–5.1)
SODIUM: 138 mmol/L (ref 135–145)
Total Bilirubin: 0.5 mg/dL (ref 0.3–1.2)
Total Protein: 5.6 g/dL — ABNORMAL LOW (ref 6.5–8.1)

## 2017-05-19 LAB — BASIC METABOLIC PANEL
ANION GAP: 8 (ref 5–15)
BUN: 10 mg/dL (ref 6–20)
CALCIUM: 8.1 mg/dL — AB (ref 8.9–10.3)
CO2: 27 mmol/L (ref 22–32)
Chloride: 102 mmol/L (ref 101–111)
Creatinine, Ser: 0.85 mg/dL (ref 0.61–1.24)
GFR calc Af Amer: >60 mL/min (ref >60)
GLUCOSE: 129 mg/dL — AB (ref 65–99)
Potassium: 3.5 mmol/L (ref 3.5–5.1)
Sodium: 137 mmol/L (ref 135–145)

## 2017-05-19 LAB — PHOSPHORUS: Phosphorus: 1.8 mg/dL — ABNORMAL LOW (ref 2.5–4.6)

## 2017-05-19 LAB — PREPARE RBC (CROSSMATCH)

## 2017-05-19 LAB — MAGNESIUM: MAGNESIUM: 2.3 mg/dL (ref 1.7–2.4)

## 2017-05-19 LAB — TRIGLYCERIDES: TRIGLYCERIDES: 453 mg/dL — AB (ref <150)

## 2017-05-19 LAB — PREALBUMIN: PREALBUMIN: 6.5 mg/dL — AB (ref 18–38)

## 2017-05-19 MED ORDER — POTASSIUM CHLORIDE 10 MEQ/50ML IV SOLN
10.0000 meq | INTRAVENOUS | Status: AC
  Administered 2017-05-19 (×2): 10 meq via INTRAVENOUS
  Filled 2017-05-19 (×2): qty 50

## 2017-05-19 MED ORDER — POTASSIUM PHOSPHATES 15 MMOLE/5ML IV SOLN
30.0000 mmol | Freq: Once | INTRAVENOUS | Status: AC
  Administered 2017-05-19: 30 mmol via INTRAVENOUS
  Filled 2017-05-19: qty 10

## 2017-05-19 MED ORDER — SODIUM CHLORIDE 0.9 % IV SOLN
Freq: Once | INTRAVENOUS | Status: AC
  Administered 2017-05-19: 11:00:00 via INTRAVENOUS

## 2017-05-19 MED ORDER — METOPROLOL TARTRATE 5 MG/5ML IV SOLN
5.0000 mg | Freq: Four times a day (QID) | INTRAVENOUS | Status: DC
  Administered 2017-05-19 – 2017-05-22 (×11): 5 mg via INTRAVENOUS
  Filled 2017-05-19 (×11): qty 5

## 2017-05-19 MED ORDER — SODIUM CHLORIDE 0.9 % IV SOLN
Freq: Once | INTRAVENOUS | Status: AC
  Administered 2017-05-19: 13:00:00 via INTRAVENOUS

## 2017-05-19 MED ORDER — METOPROLOL TARTRATE 5 MG/5ML IV SOLN
5.0000 mg | Freq: Once | INTRAVENOUS | Status: AC
  Administered 2017-05-19: 5 mg via INTRAVENOUS
  Filled 2017-05-19: qty 5

## 2017-05-19 MED ORDER — INSULIN ASPART 100 UNIT/ML ~~LOC~~ SOLN
0.0000 [IU] | SUBCUTANEOUS | Status: DC
  Administered 2017-05-19 – 2017-05-21 (×7): 2 [IU] via SUBCUTANEOUS
  Administered 2017-05-21: 3 [IU] via SUBCUTANEOUS
  Administered 2017-05-21 – 2017-05-24 (×14): 2 [IU] via SUBCUTANEOUS

## 2017-05-19 MED ORDER — TRAVASOL 10 % IV SOLN
INTRAVENOUS | Status: AC
  Administered 2017-05-19: 17:00:00 via INTRAVENOUS
  Filled 2017-05-19: qty 1152

## 2017-05-19 NOTE — Progress Notes (Signed)
Called to see pt for skin edge oozing from periumbilical portion of incision.  About 1 tablespoon of blood on the dressing.  Not actively bleeding.  Will stop heparin for 2 hours then resume  Ruta Hinds, MD [facility] Office: [phone] Pager: [phone]

## 2017-05-19 NOTE — Progress Notes (Signed)
PHARMACY - ADULT TOTAL PARENTERAL NUTRITION CONSULT NOTE   Pharmacy Consult for TPN Indication: Prolonged Ileus  Patient Measurements: Height: 5\' 6"  (167.6 cm) Weight: 230 lb 12.8 oz (104.7 kg) IBW/kg (Calculated) : 63.8 TPN AdjBW (KG): 72.3 Body mass index is 37.25 kg/m.  Assessment:  46 yo M presents on 11/17 with abdominal pain. Also reported nausea and emesis. PMH of cancer, HTN, and low back pain. CT was ordered and found SMA embolism. 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 Has been NPO since admit, trialed TFs and unable to titrate due to abdominal distension and no bowel sounds. Pharmacy consulted to start TPN due to prolonged ileus.  GI: Prolonged ileus s/p OR on 11/18. Failed tube feed trials with abdominal distension and no bowel sounds. Albumin slightly low at 2.9. Prealbumin low at 6.5. Pepcid in TPN Endo: CBGs controlled (110-140s) on SSI Insulin requirements in the past 24 hours: 2 units Lytes: wnl today exc Phos up a little to 1.8 after replacement and K down to 3.3 even after replacement. CoCa ok at 9.1. Mg ok at 2.3. Renal: SCr stable. BUN wnl. D5LR at 44ml/hr Pulm: 3L of [place]  Cards: BP mostly controlled, HR tachy. On nicardipine gtt and heparin gtt Hepatobil: LFTs ok. Tbili wnl. Trig elevated at 453 Neuro: ID: On cefepime. Afebrile yesterday, WBC up a little to 11.6.  Best Practices: Hep gtt TPN Access: PICC triple lumen TPN start date: 11/20 >>  Nutritional Goals (per RD recommendation on 11/20): KCal: 1800-2000 Protein: 105-120 g Fluid: > 1.8 L  Current Nutrition:  NPO  Plan:  Increase TPN to 80 ml/hr tonight Hold 20% lipid emulsion for first 7 days for ICU patients per ASPEN guidelines (Start date 11/27) This TPN provides 115 g of protein, 384 g of dextrose, and 0 g of lipids for a total of 1,766 kcals, which meets ~100% of patient needs Electrolytes in TPN: Increase  potassium and phos. Cl:Ac ration 1:1 Add MVI, trace elements, and famotidine to TPN Increase to moderate SSI and adjust as needed due to higher dextrose % Stop D5LR tonight Monitor TPN labs tomorrow, CBGs  Given potassium phosphate 56mmol x 1 this am per MD  Elenor Quinones, PharmD, BCPS Clinical Pharmacist Pager [phone] 05/19/2017 7:35 AM

## 2017-05-19 NOTE — Significant Event (Signed)
Received verbal orders from Dr. Scot Dock for units of pRBCs, foley removal, and A-Line removal if off Cardene drip. Per MD, to keep SBP under 150.    Marcus Pruitt

## 2017-05-19 NOTE — Progress Notes (Signed)
[facility] Progress Note Patient Name: Marcus Pruitt DOB: 1970/11/25 MRN: [id number]   Date of Service  05/19/2017  HPI/Events of Note  freq PVCs  eICU Interventions  K 3.5 repleted Lopressor 10mg  IV in divided doses     Intervention Category Intermediate Interventions: Arrhythmia - evaluation and management  Riggin Cuttino V. Demecia Northway 05/19/2017, 9:57 PM

## 2017-05-19 NOTE — Progress Notes (Signed)
PULMONARY / CRITICAL CARE MEDICINE   Name: Marcus Pruitt MRN: [id number] DOB: 03/08/1971    ADMISSION DATE:  05/14/2017 CONSULTATION DATE:  05/15/17  REFERRING MD: Dr Bridgett Larsson  CHIEF COMPLAINT: SMA dissection; Vent management  HISTORY OF PRESENT ILLNESS:   50yoM with hx HTN, OSA, Prostate cancer s/p resection, who presented to the [facility] ER on 11/16 PM c/o sudden onset excruciating mid-abdominal pain that began 11/16 at 8pm. CT showed questionable SMA clot. Patient started on heparin and transferred emergently to [facility] where he was brought to the OR and mesenteric angiogram showed it to actually be an SMA dissection, with multiple false lumens and with clot within it. Dr Bridgett Larsson reported the OR case was long and complicated with approx 2 LITERS of blood loss. Postop he wants to keep patient intubated while we work to stabilize him, then will readdress vent weaning.    SUBJECTIVE:  No events overnight, remains on cardene  VITAL SIGNS: BP 115/75   Pulse (!) 108   Temp 98.5 F (36.9 C) (Oral)   Resp 16   Ht 5\' 6"  (1.676 m)   Wt 104.7 kg (230 lb 12.8 oz)   SpO2 98%   BMI 37.25 kg/m   HEMODYNAMICS:    VENTILATOR SETTINGS:    INTAKE / OUTPUT: I/O last 3 completed shifts: In: 5671.5 [I.V.:5231.5; Other:160; NG/GT:30; IV Piggyback:250] Out: 8469 [Urine:3600; Emesis/NG output:1150]  PHYSICAL EXAMINATION: General: Awake and interactive, visibly in pain HEENT: [place]/AT, PERRL, EOM-I and MMM Neuro: Alert and interactive, moving all ext to command CV: RRR, Nl S1/S2, -M/R/G. PULM: CTA bilaterally GI: Distended, no BS audible, diffusely tender. Extremities: warm/dry, no edema  Skin: no rashes or lesions  LABS:  BMET Recent Labs  Lab 05/17/17 0346 05/18/17 0332 05/19/17 0336  NA 137 139 138  K 3.9 3.6 3.3*  CL 108 105 103  CO2 24 27 28   BUN 11 6 9   CREATININE 1.14 0.75 0.85  GLUCOSE 127* 139* 139*   Electrolytes Recent Labs  Lab 05/16/17 0601 05/17/17 0346 05/18/17 0332  05/19/17 0336  CALCIUM 7.3* 7.1* 7.9* 7.9*  MG 2.2  --  2.2 2.3  PHOS 2.3*  --  1.4* 1.8*   CBC Recent Labs  Lab 05/17/17 0346 05/18/17 0332 05/19/17 0336  WBC 8.7 13.3* 11.6*  HGB 8.8* 8.4* 7.3*  HCT 26.3* 25.7* 21.7*  PLT 121* 159 215   Coag's Recent Labs  Lab 05/15/17 0031 05/15/17 1230 05/15/17 2353  APTT 23* 28 134*  INR 0.97 1.19 1.24   Sepsis Markers Recent Labs  Lab 05/15/17 1944 05/16/17 0025 05/16/17 0558  LATICACIDVEN 1.9 1.7 1.1   ABG Recent Labs  Lab 05/15/17 1330 05/16/17 0420 05/19/17 0436  PHART 7.343* 7.374 7.399  PCO2ART 48.2* 46.0 49.7*  PO2ART 88.0 120* 69.4*   Liver Enzymes Recent Labs  Lab 05/14/17 2126 05/16/17 0601 05/19/17 0336  AST 41 36 36  ALT 34 18 23  ALKPHOS 47 22* 48  BILITOT 0.9 0.6 0.5  ALBUMIN 4.4 2.9* 2.3*   Cardiac Enzymes No results for input(s): TROPONINI, PROBNP in the last 168 hours.  Glucose Recent Labs  Lab 05/18/17 1207 05/18/17 1559 05/18/17 2057 05/18/17 2359 05/19/17 0454 05/19/17 0740  GLUCAP 138* 141* 143* 113* 122* 153*   Imaging Dg Chest Port 1 View  Result Date: 05/19/2017 CLINICAL DATA:  Shortness of Breath EXAM: PORTABLE CHEST 1 VIEW COMPARISON:  05/18/2017 FINDINGS: Cardiac shadow is stable. Nasogastric catheter is noted in satisfactory position. Right-sided PICC line is  now noted with the catheter tip at the cavoatrial junction. The overall inspiratory effort is poor with mild bibasilar atelectatic changes increased from the prior exam. No bony abnormality is seen. IMPRESSION: Increasing bibasilar atelectasis. Electronically Signed   By: Inez Catalina M.D.   On: 05/19/2017 08:39   Dg Abd Portable 1v  Result Date: 05/18/2017 CLINICAL DATA:  Check nasogastric catheter placement EXAM: PORTABLE ABDOMEN - 1 VIEW COMPARISON:  05/17/2017 FINDINGS: Nasogastric catheter is noted within the stomach. Postsurgical changes are seen. Some persistent small bowel dilatation remains. IMPRESSION:  Nasogastric catheter within the stomach. Electronically Signed   By: Inez Catalina M.D.   On: 05/18/2017 18:55   STUDIES:  CT Abdomen (11/16):  1. Irregular filling defect within the proximal superior mesenteric artery, concerning for thrombus. Vascular surgery consultation recommended. Correlation with serum lactate suggested. No CT findings to suggest acute bowel ischemia at this time. 2. No other acute intra-abdominal or pelvic process. 3. 3 mm nonobstructive right renal nephrolithiasis. 4. Sequelae of prior prostatectomy.  ANTIBIOTICS: Ertapenem 11/17 x1 (OR) Cefuroxime 11/17 x1 (OR)  SIGNIFICANT EVENTS: 11/16  Presented to [facility] with abd pain >> appears to be SMA clot per Abdominal CT 11/17  Tx to [facility] & underwent emergent surgery for SMA dissection with clot >> surgically repaired, 2L EBL > to ICU  LINES/TUBES: 18G PIV, 22G PIV Left Radial Aline 11/17 >>11/19 Foley catheter 11/17 >> ETT 11/17 >>11/19 PICC 11/20>>>  ASSESSMENT / PLAN: [name] with hx HTN, OSA, Prostate cancer s/p resection, who presented to the [facility] ER on 11/16 PM c/o sudden onset excruciating mid-abdominal pain that began 11/16 at 8pm. CT showed questionable SMA clot. Patient started on heparin and transferred emergently to [facility] where he was brought to the OR and mesenteric angiogram showed it to actually be an SMA dissection, with multiple false lumens and with clot within it. Dr Bridgett Larsson says the OR case was long and complicated with approx 2 LITERS of blood loss. Postop keep intubated.  PULMONARY A: Acute Hypoxic Respiratory failure OSA P: Titrate O2 for sat of 88-92% Pulmonary hygiene QHS CPAP IS and flutter valve  CARDIOVASCULAR A: SMA Dissection and SMA Clot s/p Surgical Repair Hemorrhagic Shock - resolved.  Off pressors.  Hx HTN P: Goal SBP < 120 PRN labetalol, hydralazine for SBP goals Add scheduled IV lopressor 5 mg IV q6 Heparin gtt per pharmacy  Will need work up for possible [employer] or [employer]  given unusual SMA dissection w/o aortic dissection in a young patient, even with chronically uncontrolled HTN Titrate cardene to off after lopressor  RENAL A: AKI Hypokalemia Hypophosphatemia P: Trend BMP / urinary output Replace electrolytes as indicated Avoid nephrotoxic agents, ensure adequate renal perfusion TPN tolerated KVO IVF  GASTROINTESTINAL A: At Risk Malnutrition  Ileus P: NPO post extubation for now  Pepcid for stress ulcer prophylaxis  TPN for ileus Surgery to decide when to use GI tract  HEMATOLOGIC A: Acute Blood Loss - in setting of SMA dissection, s/p surgical repair  Prostate cancer s/p resection P: Trend CBC  Transfuse per ICU protocol  INFECTIOUS A: Post-OP SMA repair  P: Monitor fever curve / WBC trend   ENDOCRINE A: At Risk Hypo/Hyperglycemia  P: Monitor glucose on BMP  NEUROLOGIC A: Acute Encephalopathy  Former Nature conservation officer / Family suspected PTSD with prior ETOH (remote) use  P: PCA morphine  FAMILY  Patient updated bedside, hold in the ICU until able to get off cardene then if off transfer to SDU and  to [facility] service with PCCM off 11/22.  - Inter-disciplinary family meet or Palliative Care meeting due by: 05/22/17  The patient is critically ill with multiple organ systems failure and requires high complexity decision making for assessment and support, frequent evaluation and titration of therapies, application of advanced monitoring technologies and extensive interpretation of multiple databases.   Critical Care Time devoted to patient care services described in this note is  35  Minutes. This time reflects time of care of this signee Dr Jennet Maduro. This critical care time does not reflect procedure time, or teaching time or supervisory time of PA/NP/Med student/Med Resident etc but could involve care discussion time.  Rush Farmer, M.D. [facility] Pulmonary/Critical Care Medicine. Pager: [phone]. After hours pager: [phone].

## 2017-05-19 NOTE — Progress Notes (Addendum)
Occupational Therapy Treatment Patient Details Name: Marcus Pruitt MRN: [id number] DOB: 12-31-1970 Today's Date: 05/19/2017    History of present illness Pt is a 46 y.o. male who presented to the [facility] ER on 05/14/17 with c/o sudden onset excruciating mid-abdominal pain; CT showed questionable SMA clot. Pt started on heparin and transferred emergently to [facility] where he was brought to the OR and mesenteric angiogram showed it to actually be an SMA dissection, with multiple false lumens and with clot within it. Intubated 11/16-11/19. Pertinent PMH includes HTN, OSA, prostate cancer s/p resection, LBP, R ear hearing loss.   OT comments  Pt demonstrating steady improvement toward OT goals. He was able to complete ambulating toilet transfers with min assist with Harmon Pier walker this session. Additionally, demonstrated improved activity tolerance while completing functional mobility tasks in hallway. Noted bleeding initially believed to be a result of foley catheter removal but on RN assessment, discovered from incision site. RN re-dressing and monitoring. D/C recommendation remains appropriate and OT will continue to follow while admitted. See below for pertinent vital signs.    Follow Up Recommendations  No OT follow up;Supervision/Assistance - 24 hour    Equipment Recommendations  3 in 1 bedside commode    Recommendations for Other Services      Precautions / Restrictions Precautions Precautions: Fall;Other (comment) Precaution Comments: Multiple lines Restrictions Weight Bearing Restrictions: No       Mobility Bed Mobility Overal bed mobility: Needs Assistance Bed Mobility: Rolling;Sit to Sidelying Rolling: Supervision Sidelying to sit: Min assist       General bed mobility comments: Min assist to return B LE into bed.   Transfers Overall transfer level: Needs assistance Equipment used: 1 person hand held assist(Eva walker) Transfers: Sit to/from Stand Sit to Stand: Min assist          General transfer comment: Min assist to rise into standing position.     Balance Overall balance assessment: Needs assistance Sitting-balance support: No upper extremity supported;Feet supported Sitting balance-Leahy Scale: Good     Standing balance support: Single extremity supported;No upper extremity supported;During functional activity Standing balance-Leahy Scale: Fair Standing balance comment: Able to statically stand without UE support.                            ADL either performed or assessed with clinical judgement   ADL Overall ADL's : Needs assistance/impaired Eating/Feeding: NPO           Lower Body Bathing: Maximal assistance;Sit to/from stand           Toilet Transfer: Ambulation;Minimal assistance(Eva walker) Toilet Transfer Details (indicate cue type and reason): Pt able to complete functional mobility in hallway as a precursor to increased activity tolerance for ADL.          Functional mobility during ADLs: Min guard;Rolling walker General ADL Comments: Pt with improving activity tolerance for ADL. During functional mobility, noted pt bleeding and felt likely from removal of foley catheter. RN assessed; however, and bleeding from incision site. RN re-dressed wound and monitoring.      Vision   Vision Assessment?: No apparent visual deficits   Perception     Praxis      Cognition Arousal/Alertness: Awake/alert Behavior During Therapy: WFL for tasks assessed/performed Overall Cognitive Status: Within Functional Limits for tasks assessed  Exercises     Shoulder Instructions       General Comments Pt's children present at beginning and end of session. Pt on RA throughout. HR elevated and up to 130 during activity. SpO2 desaturation to 85% noted but rebounded to upper 90s with pursed lip breathing techniques.     Pertinent Vitals/ Pain       Pain Assessment: 0-10 Pain  Score: 4  Pain Location: Abdomen Pain Descriptors / Indicators: Sore;Grimacing Pain Intervention(s): Limited activity within patient's tolerance;Monitored during session  Home Living                                          Prior Functioning/Environment              Frequency  Min 2X/week        Progress Toward Goals  OT Goals(current goals can now be found in the care plan section)  Progress towards OT goals: Progressing toward goals  Acute Rehab OT Goals Patient Stated Goal: Decreased pain OT Goal Formulation: With patient/family Time For Goal Achievement: 05/31/17 Potential to Achieve Goals: Good  Plan Discharge plan remains appropriate    Co-evaluation                 AM-PAC PT "6 Clicks" Daily Activity     Outcome Measure   Help from another person eating meals?: Total Help from another person taking care of personal grooming?: A Little Help from another person toileting, which includes using toliet, bedpan, or urinal?: A Lot Help from another person bathing (including washing, rinsing, drying)?: A Lot Help from another person to put on and taking off regular upper body clothing?: A Little Help from another person to put on and taking off regular lower body clothing?: Total 6 Click Score: 12    End of Session Equipment Utilized During Treatment: Gait belt  OT Visit Diagnosis: Pain;Other abnormalities of gait and mobility (R26.89) Pain - Right/Left: (abdomen) Pain - part of body: (abdomen)   Activity Tolerance Patient tolerated treatment well   Patient Left in chair;with call bell/phone within reach;with family/visitor present   Nurse Communication Mobility status(bleeding)        Time: 1400-1435 OT Time Calculation (min): 35 min  Charges: OT General Charges $OT Visit: 1 Visit OT Treatments $Self Care/Home Management : 23-37 mins  Norman Herrlich, MS OTR/L  Pager: [place] A Stiven Kaspar 05/19/2017, 5:26  PM

## 2017-05-19 NOTE — Care Management Note (Signed)
Case Management Note  Patient Details  Name: Bradyn Soward MRN: [id number] Date of Birth: 1970/11/09  Subjective/Objective:    From home,  POD 4 mesenteric artery dissection, conts on heparin, will need NG tube until bowel function returns, on Cardene drip, has ABLA, will be transfused 2 units.                Action/Plan: NCM will follow for dc needs.   Expected Discharge Date:  05/21/17               Expected Discharge Plan:     In-House Referral:     Discharge planning Services  CM Consult  Post Acute Care Choice:    Choice offered to:     DME Arranged:    DME Agency:     HH Arranged:    HH Agency:     Status of Service:  In process, will continue to follow  If discussed at Long Length of Stay Meetings, dates discussed:    Additional Comments:  Zenon Mayo, RN 05/19/2017, 11:31 AM

## 2017-05-19 NOTE — Progress Notes (Signed)
ANTICOAGULATION CONSULT NOTE - Follow Up Consult  Pharmacy Consult for heparin Indication: superior mesenteric vein thrombus  Labs: Recent Labs    05/17/17 0346 05/18/17 0332 05/18/17 1309 05/18/17 2153 05/19/17 0336  HGB 8.8* 8.4*  --   --  7.3*  HCT 26.3* 25.7*  --   --  21.7*  PLT 121* 159  --   --  215  HEPARINUNFRC 0.32 0.20* 0.26* 0.28* 0.37  CREATININE 1.14 0.75  --   --  0.85    Assessment/Plan:  46yo male therapeutic on heparin after rate changes. Will continue gtt at current rate and confirm stable with additional level.   Wynona Neat, PharmD, BCPS  05/19/2017,5:07 AM

## 2017-05-19 NOTE — Progress Notes (Signed)
Spoke to Dr. Ashby Dawes (elink) regarding electrolytes. Potassium level came back 3.3 and phosphorus 1.8. Patient having frequent ectopy.  MD will enter orders for electrolyte replacements.

## 2017-05-19 NOTE — Progress Notes (Signed)
   VASCULAR SURGERY ASSESSMENT & PLAN:   4 Days Post-Op s/p: Repair of superior mesenteric artery dissection.  His abdominal pain has improved significantly.  Still no flatus or BM.  He continues his heparin.  We will need to continue NG tube until his bowel function returns.  He will need to stay in the ICU while he is still on Cardene.  Acute blood loss anemia: He is to be transfused 2 units.   SUBJECTIVE:   No significant abdominal pain this morning.  PHYSICAL EXAM:   Vitals:   05/19/17 0743 05/19/17 0800 05/19/17 0815 05/19/17 0900  BP:      Pulse:  (!) 110  (!) 108  Resp:  17 20 16   Temp: 98.5 F (36.9 C)     TempSrc: Oral     SpO2:  97% 98% 98%  Weight:      Height:       ABDOMEN: No bowel sounds.  A little bit less distended. LUNGS: Lungs are clear bilaterally to auscultation.  LABS:   Lab Results  Component Value Date   WBC 11.6 (H) 05/19/2017   HGB 7.3 (L) 05/19/2017   HCT 21.7 (L) 05/19/2017   MCV 82.8 05/19/2017   PLT 215 05/19/2017   Lab Results  Component Value Date   CREATININE 0.85 05/19/2017   Lab Results  Component Value Date   INR 1.24 05/15/2017   CBG (last 3)  Recent Labs    05/18/17 2359 05/19/17 0454 05/19/17 0740  GLUCAP 113* 122* 153*    PROBLEM LIST:    Active Problems:   Mesenteric artery vascular embolism (HCC)   Vascular disorder of small intestine (HCC)   Dissection of unspecified artery (HCC)   CURRENT MEDS:   . chlorhexidine  15 mL Mouth Rinse BID  . Chlorhexidine Gluconate Cloth  6 each Topical Daily  . HYDROmorphone   Intravenous Q4H  . insulin aspart  0-15 Units Subcutaneous Q4H  . mouth rinse  15 mL Mouth Rinse q12n4p  . sodium chloride flush  10-40 mL Intracatheter Q12H    Deitra Mayo Beeper: [phone] Office: [phone] 05/19/2017

## 2017-05-19 NOTE — Progress Notes (Signed)
ANTICOAGULATION CONSULT NOTE   Pharmacy Consult for IV heparin Indication: superior mesenteric vein thrombus/VTE  No Known Allergies  Patient Measurements: Height: 5\' 6"  (167.6 cm) Weight: 230 lb 12.8 oz (104.7 kg) IBW/kg (Calculated) : 63.8 Heparin Dosing Weight: 87 kg  Vital Signs: Temp: 98.5 F (36.9 C) (11/21 1054) Temp Source: Oral (11/21 1054) BP: 129/85 (11/21 1100) Pulse Rate: 93 (11/21 1100)  Labs: Recent Labs    05/17/17 0346 05/18/17 0332  05/18/17 2153 05/19/17 0336 05/19/17 1000  HGB 8.8* 8.4*  --   --  7.3*  --   HCT 26.3* 25.7*  --   --  21.7*  --   PLT 121* 159  --   --  215  --   HEPARINUNFRC 0.32 0.20*   < > 0.28* 0.37 0.38  CREATININE 1.14 0.75  --   --  0.85  --    < > = values in this interval not displayed.    Estimated Creatinine Clearance: 123.2 mL/min (by C-G formula based on SCr of 0.85 mg/dL).  Assessment: 16 yoM c/o abdominal pain found to have superior mesenteric artery thrombus. Heparin reordered post-op.   Heparin level now at goal this am x 2.  Patient with significant post-op acute blood loss anemia. Hgb down to 7.3 this am and plans are to transfuse 2 units, no overt bleeding noted.  Goal of Therapy:  Heparin level 0.3-0.7 units/ml Monitor platelets by anticoagulation protocol: Yes   Plan:  Continue heparin gtt at 1800 units/hr Daily heparin level and CBC Monitor for s/s bleeding   Thank you for allowing pharmacy to be a part of this patient's care.  Erin Hearing PharmD., BCPS Clinical Pharmacist Pager [phone] 05/19/2017 11:52 AM

## 2017-05-20 ENCOUNTER — Encounter (HOSPITAL_COMMUNITY)
Admission: EM | Disposition: A | Discharge status: Home / Self Care | Payer: Self-pay | Source: Home / Self Care | Attending: Vascular Surgery

## 2017-05-20 ENCOUNTER — Inpatient Hospital Stay (HOSPITAL_COMMUNITY): Payer: 59 | Admitting: Certified Registered Nurse Anesthetist

## 2017-05-20 DIAGNOSIS — K55059 Acute (reversible) ischemia of intestine, part and extent unspecified: Secondary | ICD-10-CM

## 2017-05-20 HISTORY — PX: ABDOMINAL WOUND DEHISCENCE: SHX540

## 2017-05-20 LAB — TYPE AND SCREEN
ABO/RH(D): A POS
ANTIBODY SCREEN: NEGATIVE
UNIT DIVISION: 0
UNIT DIVISION: 0

## 2017-05-20 LAB — BPAM RBC
BLOOD PRODUCT EXPIRATION DATE: 201811282359
Blood Product Expiration Date: 201811282359
ISSUE DATE / TIME: 201811211024
ISSUE DATE / TIME: 201811211236
Unit Type and Rh: 6200
Unit Type and Rh: 6200

## 2017-05-20 LAB — COMPREHENSIVE METABOLIC PANEL
ALBUMIN: 2.6 g/dL — AB (ref 3.5–5.0)
ALK PHOS: 58 U/L (ref 38–126)
ALT: 23 U/L (ref 17–63)
AST: 38 U/L (ref 15–41)
Anion gap: 8 (ref 5–15)
BUN: 12 mg/dL (ref 6–20)
CALCIUM: 8.3 mg/dL — AB (ref 8.9–10.3)
CO2: 26 mmol/L (ref 22–32)
CREATININE: 0.85 mg/dL (ref 0.61–1.24)
Chloride: 104 mmol/L (ref 101–111)
GFR calc non Af Amer: >60 mL/min (ref >60)
GLUCOSE: 128 mg/dL — AB (ref 65–99)
Potassium: 3.7 mmol/L (ref 3.5–5.1)
SODIUM: 138 mmol/L (ref 135–145)
Total Bilirubin: 1.1 mg/dL (ref 0.3–1.2)
Total Protein: 6 g/dL — ABNORMAL LOW (ref 6.5–8.1)

## 2017-05-20 LAB — CBC
HCT: 26.9 % — ABNORMAL LOW (ref 39.0–52.0)
HEMOGLOBIN: 9.2 g/dL — AB (ref 13.0–17.0)
MCH: 28.2 pg (ref 26.0–34.0)
MCHC: 34.2 g/dL (ref 30.0–36.0)
MCV: 82.5 fL (ref 78.0–100.0)
Platelets: 228 10*3/uL (ref 150–400)
RBC: 3.26 MIL/uL — AB (ref 4.22–5.81)
RDW: 15 % (ref 11.5–15.5)
WBC: 10.6 10*3/uL — AB (ref 4.0–10.5)

## 2017-05-20 LAB — GLUCOSE, CAPILLARY
GLUCOSE-CAPILLARY: 125 mg/dL — AB (ref 65–99)
GLUCOSE-CAPILLARY: 135 mg/dL — AB (ref 65–99)
GLUCOSE-CAPILLARY: 138 mg/dL — AB (ref 65–99)
Glucose-Capillary: 145 mg/dL — ABNORMAL HIGH (ref 65–99)
Glucose-Capillary: 146 mg/dL — ABNORMAL HIGH (ref 65–99)
Glucose-Capillary: 177 mg/dL — ABNORMAL HIGH (ref 65–99)

## 2017-05-20 LAB — HEPARIN LEVEL (UNFRACTIONATED): Heparin Unfractionated: 0.29 IU/mL — ABNORMAL LOW (ref 0.30–0.70)

## 2017-05-20 LAB — PHOSPHORUS: Phosphorus: 2 mg/dL — ABNORMAL LOW (ref 2.5–4.6)

## 2017-05-20 LAB — MAGNESIUM: Magnesium: 2.2 mg/dL (ref 1.7–2.4)

## 2017-05-20 SURGERY — REPAIR, DEHISCENCE, WOUND, ABDOMEN
Anesthesia: General | Site: Abdomen

## 2017-05-20 MED ORDER — SUCCINYLCHOLINE CHLORIDE 20 MG/ML IJ SOLN
INTRAMUSCULAR | Status: DC | PRN
  Administered 2017-05-20: 100 mg via INTRAVENOUS

## 2017-05-20 MED ORDER — 0.9 % SODIUM CHLORIDE (POUR BTL) OPTIME
TOPICAL | Status: DC | PRN
  Administered 2017-05-20: 1000 mL
  Administered 2017-05-20: 2000 mL
  Administered 2017-05-20: 1000 mL

## 2017-05-20 MED ORDER — FENTANYL CITRATE (PF) 100 MCG/2ML IJ SOLN
INTRAMUSCULAR | Status: DC | PRN
  Administered 2017-05-20: 50 ug via INTRAVENOUS
  Administered 2017-05-20: 150 ug via INTRAVENOUS
  Administered 2017-05-20: 50 ug via INTRAVENOUS

## 2017-05-20 MED ORDER — FENTANYL CITRATE (PF) 250 MCG/5ML IJ SOLN
INTRAMUSCULAR | Status: AC
  Filled 2017-05-20: qty 5

## 2017-05-20 MED ORDER — LABETALOL HCL 5 MG/ML IV SOLN
10.0000 mg | INTRAVENOUS | Status: AC | PRN
  Administered 2017-05-20 (×4): 20 mg via INTRAVENOUS
  Filled 2017-05-20 (×4): qty 4

## 2017-05-20 MED ORDER — ONDANSETRON HCL 4 MG/2ML IJ SOLN
INTRAMUSCULAR | Status: DC | PRN
  Administered 2017-05-20: 4 mg via INTRAVENOUS

## 2017-05-20 MED ORDER — FENTANYL CITRATE (PF) 100 MCG/2ML IJ SOLN: 25.0000 ug | INTRAMUSCULAR | Status: DC | PRN

## 2017-05-20 MED ORDER — PROPOFOL 10 MG/ML IV BOLUS
INTRAVENOUS | Status: DC | PRN
  Administered 2017-05-20: 150 mg via INTRAVENOUS

## 2017-05-20 MED ORDER — ALBUMIN HUMAN 5 % IV SOLN
INTRAVENOUS | Status: DC | PRN
  Administered 2017-05-20: 11:00:00 via INTRAVENOUS

## 2017-05-20 MED ORDER — SUGAMMADEX SODIUM 200 MG/2ML IV SOLN
INTRAVENOUS | Status: DC | PRN
  Administered 2017-05-20: 500 mg via INTRAVENOUS

## 2017-05-20 MED ORDER — HYDRALAZINE HCL 20 MG/ML IJ SOLN
10.0000 mg | INTRAMUSCULAR | Status: DC | PRN
  Administered 2017-05-20 – 2017-05-22 (×7): 40 mg via INTRAVENOUS
  Filled 2017-05-20 (×8): qty 2

## 2017-05-20 MED ORDER — POTASSIUM PHOSPHATES 15 MMOLE/5ML IV SOLN
40.0000 mmol | Freq: Once | INTRAVENOUS | Status: AC
  Administered 2017-05-20: 40 mmol via INTRAVENOUS
  Filled 2017-05-20: qty 13.33

## 2017-05-20 MED ORDER — ROCURONIUM BROMIDE 100 MG/10ML IV SOLN
INTRAVENOUS | Status: DC | PRN
  Administered 2017-05-20: 50 mg via INTRAVENOUS

## 2017-05-20 MED ORDER — MIDAZOLAM HCL 2 MG/2ML IJ SOLN
INTRAMUSCULAR | Status: AC
  Filled 2017-05-20: qty 2

## 2017-05-20 MED ORDER — ONDANSETRON HCL 4 MG/2ML IJ SOLN: 4.0000 mg | Freq: Once | INTRAMUSCULAR | Status: DC | PRN

## 2017-05-20 MED ORDER — LACTATED RINGERS IV SOLN
INTRAVENOUS | Status: DC | PRN
  Administered 2017-05-20 (×2): via INTRAVENOUS

## 2017-05-20 MED ORDER — PHENYLEPHRINE HCL 10 MG/ML IJ SOLN
INTRAMUSCULAR | Status: DC | PRN
  Administered 2017-05-20: 80 ug via INTRAVENOUS

## 2017-05-20 MED ORDER — HEPARIN (PORCINE) IN NACL 100-0.45 UNIT/ML-% IJ SOLN
1850.0000 [IU]/h | INTRAMUSCULAR | Status: DC
  Administered 2017-05-20: 1800 [IU]/h via INTRAVENOUS
  Administered 2017-05-21: 1900 [IU]/h via INTRAVENOUS
  Administered 2017-05-21: 2000 [IU]/h via INTRAVENOUS
  Administered 2017-05-22 – 2017-05-24 (×4): 2100 [IU]/h via INTRAVENOUS
  Administered 2017-05-24: 2000 [IU]/h via INTRAVENOUS
  Administered 2017-05-25: 1850 [IU]/h via INTRAVENOUS
  Filled 2017-05-20 (×14): qty 250

## 2017-05-20 MED ORDER — PROPOFOL 10 MG/ML IV BOLUS
INTRAVENOUS | Status: AC
  Filled 2017-05-20: qty 20

## 2017-05-20 MED ORDER — TRAVASOL 10 % IV SOLN
INTRAVENOUS | Status: AC
  Administered 2017-05-20: 17:00:00 via INTRAVENOUS
  Filled 2017-05-20: qty 1152

## 2017-05-20 SURGICAL SUPPLY — 26 items
CANISTER SUCT 3000ML PPV (MISCELLANEOUS) ×3 IMPLANT
DRAPE HALF SHEET 40X57 (DRAPES) ×6 IMPLANT
DRSG COVADERM 4X14 (GAUZE/BANDAGES/DRESSINGS) ×3 IMPLANT
DRSG COVADERM 4X6 (GAUZE/BANDAGES/DRESSINGS) ×3 IMPLANT
ELECT REM PT RETURN 9FT ADLT (ELECTROSURGICAL) ×3
ELECTRODE REM PT RTRN 9FT ADLT (ELECTROSURGICAL) ×1 IMPLANT
GLOVE BIO SURGEON STRL SZ 6.5 (GLOVE) ×4 IMPLANT
GLOVE BIO SURGEON STRL SZ7.5 (GLOVE) ×9 IMPLANT
GLOVE BIO SURGEONS STRL SZ 6.5 (GLOVE) ×2
GLOVE BIOGEL PI IND STRL 8 (GLOVE) ×2 IMPLANT
GLOVE BIOGEL PI INDICATOR 8 (GLOVE) ×4
GLOVE SS N UNI LF 7.5 STRL (GLOVE) ×3 IMPLANT
GOWN STRL REUS W/ TWL LRG LVL3 (GOWN DISPOSABLE) ×4 IMPLANT
GOWN STRL REUS W/TWL LRG LVL3 (GOWN DISPOSABLE) ×8
KIT BASIN OR (CUSTOM PROCEDURE TRAY) ×3 IMPLANT
KIT REMOVER STAPLE SKIN (MISCELLANEOUS) ×3 IMPLANT
KIT ROOM TURNOVER OR (KITS) ×3 IMPLANT
NS IRRIG 1000ML POUR BTL (IV SOLUTION) ×12 IMPLANT
PACK PERIPHERAL VASCULAR (CUSTOM PROCEDURE TRAY) ×3 IMPLANT
PAD ARMBOARD 7.5X6 YLW CONV (MISCELLANEOUS) ×6 IMPLANT
RETAINER VISCERA MED (MISCELLANEOUS) ×3 IMPLANT
STAPLER VISISTAT 35W (STAPLE) ×3 IMPLANT
SUT PDS AB 1 TP1 54 (SUTURE) ×3 IMPLANT
SUT VIC AB 3-0 SH 27 (SUTURE) ×4
SUT VIC AB 3-0 SH 27X BRD (SUTURE) ×2 IMPLANT
WATER STERILE IRR 1000ML POUR (IV SOLUTION) ×3 IMPLANT

## 2017-05-20 NOTE — Progress Notes (Signed)
   VASCULAR SURGERY ASSESSMENT & PLAN:   5 Days Post-Op s/p: Repair of superior mesenteric artery dissection.  Still no flatus or BM.  No bowel sounds.  Continue NG tube.  He appeared to have some oozing from his staple line at the umbilicus and I removed one staple that I felt might be causing this.  Subsequently he developed significant oozing of old blood which would suggest intra-abdominal old blood and a small fascial dehiscence.  If the drainage increases significantly he will have to be returned to the operating room for exploration.  He is n.p.o.  NUTRITION: Continue TNA  Hemoglobin significantly improved after transfusion yesterday.  SUBJECTIVE:   Feels better today.  No BM no flatus.  PHYSICAL EXAM:   Vitals:   05/20/17 0400 05/20/17 0500 05/20/17 0600 05/20/17 0700  BP: (!) 171/105 (!) 172/113 (!) 141/86   Pulse: (!) 105 (!) 116 100   Resp: 16 19 20    Temp:      TempSrc:      SpO2: 98% 100% 100%   Weight:    228 lb 14.4 oz (103.8 kg)  Height:       Abdomen with no bowel sounds.  Still distended. Old bloody drainage from central part of incision. Lungs are clear.  LABS:   Lab Results  Component Value Date   WBC 10.6 (H) 05/20/2017   HGB 9.2 (L) 05/20/2017   HCT 26.9 (L) 05/20/2017   MCV 82.5 05/20/2017   PLT 228 05/20/2017   Lab Results  Component Value Date   CREATININE 0.85 05/20/2017   Lab Results  Component Value Date   INR 1.24 05/15/2017   CBG (last 3)  Recent Labs    05/19/17 1951 05/19/17 2333 05/20/17 0323  GLUCAP 138* 119* 146*    PROBLEM LIST:    Active Problems:   Mesenteric artery vascular embolism (HCC)   Vascular disorder of small intestine (HCC)   Dissection of unspecified artery (HCC)   CURRENT MEDS:   . chlorhexidine  15 mL Mouth Rinse BID  . Chlorhexidine Gluconate Cloth  6 each Topical Daily  . HYDROmorphone   Intravenous Q4H  . insulin aspart  0-15 Units Subcutaneous Q4H  . mouth rinse  15 mL Mouth Rinse q12n4p    . metoprolol tartrate  5 mg Intravenous Q6H  . sodium chloride flush  10-40 mL Intracatheter Q12H    Deitra Mayo Beeper: [phone] Office: [phone] 05/20/2017

## 2017-05-20 NOTE — Progress Notes (Signed)
NG output noted to be blood-tinged/coffee grounds. Morning labs sent and heparin infusion stopped. Spoke to Dr. Elsworth Soho and updated.   Keep heparin off for now and will await lab results.

## 2017-05-20 NOTE — Progress Notes (Signed)
PHARMACY - ADULT TOTAL PARENTERAL NUTRITION CONSULT NOTE   Pharmacy Consult for TPN Indication: Prolonged Ileus  Patient Measurements: Height: 5\' 6"  (167.6 cm) Weight: 230 lb 12.8 oz (104.7 kg) IBW/kg (Calculated) : 63.8 TPN AdjBW (KG): 72.3 Body mass index is 37.25 kg/m.  Assessment:  46 yo M presents on 11/17 with abdominal pain. Also reported nausea and emesis. PMH of cancer, HTN, and low back pain. CT was ordered and found SMA embolism. 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 Has been NPO since admit, trialed TFs and unable to titrate due to abdominal distension and no bowel sounds. Pharmacy consulted to start TPN due to prolonged ileus.  GI: Prolonged ileus s/p OR on 11/18. Failed tube feed trials with abdominal distension and no bowel sounds. NG output high at 1,0103mL yesterday. Albumin slightly low at 2.6. Prealbumin low at 6.5. Pepcid in TPN Endo: CBGs controlled (110-150s) on SSI Insulin requirements in the past 24 hours: 8 units Lytes: wnl today exc Phos up a little to 2.0 after replacement and K low at 3.7 (goal > 4 with ileus) even after replacement. CoCa ok at 9.2. Mg ok at 2.2 (goal > 2 with ileus) Renal: SCr stable. BUN wnl. D5LR at 76ml/hr Pulm: Down to RA  Cards: BP mostly controlled, HR tachy. On nicardipine gtt and heparin gtt. Heparin stopped due to coffee ground emesis from NG tube this am. Hepatobil: LFTs ok. Tbili wnl but up to 1.1. Trig elevated at 453 Neuro: ID: On cefepime. Afebrile yesterday, WBC up a little to 11.6.  Best Practices: Hep gtt TPN Access: PICC triple lumen TPN start date: 11/20 >>  Nutritional Goals (per RD recommendation on 11/20): KCal: 1800-2000 Protein: 105-120 g Fluid: > 1.8 L  Current Nutrition:  NPO  Plan:  Continue TPN to 80 ml/hr tonight Hold 20% lipid emulsion for first 7 days for ICU patients per ASPEN guidelines (Start date 11/27) This  TPN provides 115 g of protein, 384 g of dextrose, and 0 g of lipids for a total of 1,766 kcals, which meets ~100% of patient needs Electrolytes in TPN: Increase phos. Cl:Ac ration 1:1 Add MVI, trace elements, and famotidine to TPN Continue moderate SSI and adjust as needed  Monitor TPN labs, Bmet and Phos tomorrow  Given potassium phosphate 26mmol x 1 this am for replacement  Elenor Quinones, PharmD, BCPS Clinical Pharmacist Pager [phone] 05/20/2017 7:13 AM

## 2017-05-20 NOTE — Anesthesia Procedure Notes (Signed)
Procedure Name: Intubation Date/Time: 05/20/2017 11:05 AM Performed by: Murice Barbar T, CRNA Pre-anesthesia Checklist: Patient identified, Emergency Drugs available, Suction available and Patient being monitored Patient Re-evaluated:Patient Re-evaluated prior to induction Oxygen Delivery Method: Circle system utilized and Simple face mask Preoxygenation: Pre-oxygenation with 100% oxygen Induction Type: IV induction and Rapid sequence Laryngoscope Size: Miller and 3 Grade View: Grade I Tube type: Oral Tube size: 7.5 mm Number of attempts: 1 Airway Equipment and Method: Patient positioned with wedge pillow and Stylet Placement Confirmation: ETT inserted through vocal cords under direct vision,  positive ETCO2 and breath sounds checked- equal and bilateral Secured at: 22 cm Tube secured with: Tape Dental Injury: Teeth and Oropharynx as per pre-operative assessment

## 2017-05-20 NOTE — Progress Notes (Signed)
Patient continues to be hypertensive. PRN hydralazine already administered. Current BP 194/100 and tachycardic. Continues to be in pain and uncomfortable despite PCA use.   Spoke to Dr. Elsworth Soho, who will update the hydralazine order and will order PRN labetalol.

## 2017-05-20 NOTE — Transfer of Care (Signed)
Immediate Anesthesia Transfer of Care Note  Patient: Marcus Pruitt  Procedure(s) Performed: REPAIR OF ABDOMINAL WOUND DEHISCENCE AND EVACUATION OF HEMATOMA (N/A Abdomen)  Patient Location: ICU  Anesthesia Type:General  Level of Consciousness: awake, alert  and oriented  Airway & Oxygen Therapy: Patient Spontanous Breathing and Patient connected to nasal cannula oxygen  Post-op Assessment: Report given to RN and Post -op Vital signs reviewed and stable  Post vital signs: Reviewed and stable  Last Vitals:  Vitals:   05/20/17 0938 05/20/17 1000  BP:  (!) 148/96  Pulse:  (!) 102  Resp: (!) 21 (!) 24  Temp:    SpO2: 100% 100%    Last Pain:  Vitals:   05/20/17 0938  TempSrc:   PainSc: 6       Patients Stated Pain Goal: 3 (21/58/72 7618)  Complications: No apparent anesthesia complications

## 2017-05-20 NOTE — Progress Notes (Signed)
VASCULAR SURGERY ADDENDUM  This patient has significant drainage from the midportion of his incision and therefore I believe he has an abdominal dehiscence.  He will need to be returned to the operating room for reclosure of his fascia.  I discussed this with the patient and his family.  Deitra Mayo, MD, [place] [phone] Office: [phone]

## 2017-05-20 NOTE — Progress Notes (Signed)
Patient having frequent PVCs and non-sustained runs of V-tach. Complaining of lower back pain (chronic). Dilaudid PCA in use.   Spoke to Dr. Elsworth Soho (e-link) and orders received to administer midnight lopressor dose now and STAT BMP.

## 2017-05-20 NOTE — Progress Notes (Signed)
Patient continues to have frequent runs of non-sustained V-tach and PVCs. Patient continues to be hypertensive despite lopressor and PRN hydralazine administration. Continues to have surgical and lower back pain despite PCA.   Spoke to Dr. Elsworth Soho and discussed patient condition/lab results.  Order received for IV potassium replacement and an additional dose of IV lopressor.   I also spoke to Dr. Oneida Alar (on-call for vascular) and he is aware of patient condition.

## 2017-05-20 NOTE — Op Note (Signed)
   NAME: Marcus Pruitt    MRN: [id number] DOB: 07/04/1970    DATE OF OPERATION: 05/20/2017  PREOP DIAGNOSIS:    Abdominal wound dehiscence  POSTOP DIAGNOSIS:    Same  PROCEDURE:    1.  Repair of abdominal wound dehiscence 2.  Evacuation of intra-abdominal hematoma  SURGEON: Judeth Cornfield. Scot Dock, MD, FACS  ASSIST: Leontine Locket, PA  ANESTHESIA: General  EBL: Minimal  INDICATIONS:    Marcus Pruitt is a 46 y.o. male who it undergone repair of a superior mesenteric artery dissection approximately a week ago.  He developed some drainage from his incision.  This became significant and therefore I felt that he likely had a fascial dehiscence.  He is brought back to the operating room for exploration and repair.  FINDINGS:   There was a large amount of old blood and hematoma in the abdomen which was all evacuated and the abdomen was irrigated.  The small intestine was run and looked viable throughout.  The superior mesenteric artery had a good pulse and also a good Doppler signal.  TECHNIQUE:   The patient was taken to the operating room and received a general anesthetic.  The abdomen was prepped and draped in the usual sterile fashion.  Previous incision was opened after the staples were removed.  There was a large amount of old blood which was evacuated.  The abdomen was irrigated with copious amounts of saline.  There was a large amount of hematoma in the pelvis which was also evacuated.  In addition there was a large amount of old blood in the left upper quadrant which was evacuated.  I exposed the area of the patch angioplasty of the superior mesenteric artery and there was a good pulse here and good Doppler signal.  I ran the entire small bowel which appeared viable throughout although it was distended.  The colon also looked viable.  The abdominal contents were returned to their normal position and the fascial layer was closed with 2 #1 PDS sutures.  The subcutaneous layer was closed  with 3-0 Vicryl.  The skin was closed with staples.  A sterile dressing was applied.  The patient tolerated the procedure well and was transferred to the recovery room in stable condition.  All needle and sponge counts were correct.  Deitra Mayo, MD, FACS [facility]  DATE OF DICTATION:   05/20/2017

## 2017-05-20 NOTE — Anesthesia Postprocedure Evaluation (Signed)
Anesthesia Post Note  Patient: Marcus Pruitt  Procedure(s) Performed: REPAIR OF ABDOMINAL WOUND DEHISCENCE AND EVACUATION OF HEMATOMA (N/A Abdomen)     Patient location during evaluation: PACU Anesthesia Type: General Level of consciousness: awake (Neuro at baseline) Pain management: pain level controlled Vital Signs Assessment: post-procedure vital signs reviewed and stable Respiratory status: spontaneous breathing, nonlabored ventilation, respiratory function stable and patient connected to nasal cannula oxygen Cardiovascular status: blood pressure returned to baseline and stable Postop Assessment: no apparent nausea or vomiting Anesthetic complications: no    Last Vitals:  Vitals:   05/20/17 1300 05/20/17 1345  BP: 136/83 119/69  Pulse: (!) 112 (!) 108  Resp: 17 19  Temp:    SpO2: 95% 98%    Last Pain:  Vitals:   05/20/17 1250  TempSrc: Oral  PainSc:                  Catalina Gravel

## 2017-05-20 NOTE — Anesthesia Preprocedure Evaluation (Addendum)
Anesthesia Evaluation  Patient identified by MRN, date of birth, ID band Patient awake    Reviewed: Allergy & Precautions, NPO status , Patient's Chart, lab work & pertinent test results  History of Anesthesia Complications Negative for: history of anesthetic complications  Airway Mallampati: II  TM Distance: >3 FB Neck ROM: Full    Dental  (+) Teeth Intact,    Pulmonary neg shortness of breath, sleep apnea , neg COPD, neg recent URI, former smoker,    breath sounds clear to auscultation       Cardiovascular hypertension, Pt. on medications + Peripheral Vascular Disease   Rhythm:Regular     Neuro/Psych negative neurological ROS  negative psych ROS   GI/Hepatic Neg liver ROS, abdominal dehiscence  5 Days Post-Op s/p: Repair of superior mesenteric artery dissection.   Endo/Other  negative endocrine ROS  Renal/GU negative Renal ROS     Musculoskeletal   Abdominal   Peds  Hematology  (+) Blood dyscrasia, anemia ,   Anesthesia Other Findings ? mesenteric ischemia, hypokalemia  Reproductive/Obstetrics                            Anesthesia Physical  Anesthesia Plan  ASA: IV and emergent  Anesthesia Plan: General   Post-op Pain Management:    Induction: Intravenous  PONV Risk Score and Plan: 2 and Ondansetron and Dexamethasone  Airway Management Planned: Oral ETT  Additional Equipment:   Intra-op Plan:   Post-operative Plan: Extubation in OR and Possible Post-op intubation/ventilation  Informed Consent: I have reviewed the patients History and Physical, chart, labs and discussed the procedure including the risks, benefits and alternatives for the proposed anesthesia with the patient or authorized representative who has indicated his/her understanding and acceptance.   Dental advisory given  Plan Discussed with: CRNA and Surgeon  Anesthesia Plan Comments:        Anesthesia  Quick Evaluation

## 2017-05-20 NOTE — Progress Notes (Addendum)
ANTICOAGULATION CONSULT NOTE   Pharmacy Consult for IV heparin Indication: superior mesenteric vein thrombus/VTE  No Known Allergies  Patient Measurements: Height: 5\' 6"  (167.6 cm) Weight: 228 lb 14.4 oz (103.8 kg) IBW/kg (Calculated) : 63.8 Heparin Dosing Weight: 87 kg  Vital Signs: Temp: 98.7 F (37.1 C) (11/22 0323) Temp Source: Oral (11/22 0323) BP: 141/86 (11/22 0600) Pulse Rate: 100 (11/22 0600)  Labs: Recent Labs    05/18/17 0332  05/19/17 0336 05/19/17 1000 05/19/17 2100 05/20/17 0406  HGB 8.4*  --  7.3*  --   --  9.2*  HCT 25.7*  --  21.7*  --   --  26.9*  PLT 159  --  215  --   --  228  HEPARINUNFRC 0.20*   < > 0.37 0.38  --  0.29*  CREATININE 0.75  --  0.85  --  0.85 0.85   < > = values in this interval not displayed.    Estimated Creatinine Clearance: 122.6 mL/min (by C-G formula based on SCr of 0.85 mg/dL).  Assessment: 71 yoM c/o abdominal pain found to have superior mesenteric artery thrombus. Heparin reordered post-op.   NG output noticed to be bloody this am, upon removal of stables further oozing occurred so heparin off and planning for exploration in OR of intraabdominal source. CBC up to 9.2 s/p 2 units pRBCs yesterday.  Goal of Therapy:  Heparin level 0.3-0.7 units/ml Monitor platelets by anticoagulation protocol: Yes   Plan:  -Hold heparin for now -F/U plans post-OR  ADDENDUM: OR showed old blood and hematoma in abdomen which was irrigated and evacuated. Per VVS okay to resume heparin tonight at 2000 with no bolus.  Plan: -Resume heparin with no bolus at 1800 units/hr tonight at 2000 -Check heparin level with am labs  Arrie Senate, PharmD PGY-2 Cardiology Pharmacy Resident Pager: [phone] 05/20/2017

## 2017-05-20 NOTE — Progress Notes (Signed)
Pt refuses cpap

## 2017-05-20 NOTE — Progress Notes (Signed)
Upon assessment, pt abdominal incision dressing was saturated with blood and draining down abdomen.  Applied pressure dressing and paged surgeon to bedside. Pt placed in bed new dressing applied with close monitoring. CHG, sacral pad, new gown and leads applied to pt with anticipation of surgery. Education provided to pt and family at bedside. PRN BP meds provided. Will prepare pt for OR per Dr Scot Dock orders.

## 2017-05-20 NOTE — Significant Event (Signed)
Patient oozing over dressing near umbilical that was placed over site at 11am. Called to Dr. Scot Dock regarding this, as patient is also complaining of back pain, which patient stated is chronic for him but he has not voiced it to RN today until now. Dr. Nicole Cella advised RN to call Dr. Oneida Alar, as he is in the office. RN paged Dr. Oneida Alar who was in surgery at the time and called back, that he will come to bedside to assess afterwards.    Marcus Pruitt

## 2017-05-20 NOTE — Progress Notes (Signed)
PULMONARY / CRITICAL CARE MEDICINE   Name: Marcus Pruitt MRN: [id number] DOB: [birth year]    ADMISSION DATE:  05/14/2017 CONSULTATION DATE:  05/15/17  REFERRING MD: Dr Bridgett Larsson  CHIEF COMPLAINT: SMA dissection; Vent management  HISTORY OF PRESENT ILLNESS:   [name] with hx HTN, OSA, Prostate cancer s/p resection, who presented to the [facility] ER on 11/16 PM c/o sudden onset excruciating mid-abdominal pain that began 11/16 at 8pm. CT showed questionable SMA clot. Patient started on heparin and transferred emergently to [facility] where he was brought to the OR and mesenteric angiogram showed it to actually be an SMA dissection, with multiple false lumens and with clot within it. Dr Bridgett Larsson reported the OR case was long and complicated with approx 2 LITERS of blood loss. Postop he wants to keep patient intubated while we work to stabilize him, then will readdress vent weaning.    SUBJECTIVE:  Taken urgently to the OR 05/20/2017 for wound dehiscence and evacuation of intra-abdominal hematoma.  Returned from the OR awake alert and not requiring mechanical ventilatory support.  VITAL SIGNS: BP (!) 148/96   Pulse (!) 102   Temp 99.1 F (37.3 C) (Oral)   Resp (!) 24   Ht 5\' 6"  (1.676 m)   Wt 228 lb 14.4 oz (103.8 kg)   SpO2 100%   BMI 36.95 kg/m   HEMODYNAMICS:    VENTILATOR SETTINGS:    INTAKE / OUTPUT: I/O last 3 completed shifts: In: 4353.1 [I.V.:3463.1; Blood:630; Other:60; IV Piggyback:200] Out: 9924 [Urine:2715; Emesis/NG output:1380]  PHYSICAL EXAMINATION: General: Awake alert complaining of abdominal pain  HEENT: No JVD lymphadenopathy is appreciated PSY: In pain Neuro: Follows commands, moves all extremities x4.  Complains of abdominal pain CV: Heart sounds are regular rate and rhythm PULM: Clear to auscultation GI: Abdomen is tender and distended.  No bowel sounds appreciated, abdominal dressing dry and intact extremities: Warm with 1+ edema Skin: Warm and dry   LABS:  BMET Recent  Labs  Lab 05/19/17 0336 05/19/17 2100 05/20/17 0406  NA 138 137 138  K 3.3* 3.5 3.7  CL 103 102 104  CO2 28 27 26   BUN 9 10 12   CREATININE 0.85 0.85 0.85  GLUCOSE 139* 129* 128*   Electrolytes Recent Labs  Lab 05/18/17 0332 05/19/17 0336 05/19/17 2100 05/20/17 0406  CALCIUM 7.9* 7.9* 8.1* 8.3*  MG 2.2 2.3  --  2.2  PHOS 1.4* 1.8*  --  2.0*   CBC Recent Labs  Lab 05/18/17 0332 05/19/17 0336 05/20/17 0406  WBC 13.3* 11.6* 10.6*  HGB 8.4* 7.3* 9.2*  HCT 25.7* 21.7* 26.9*  PLT 159 215 228   Coag's Recent Labs  Lab 05/15/17 0031 05/15/17 1230 05/15/17 2353  APTT 23* 28 134*  INR 0.97 1.19 1.24   Sepsis Markers Recent Labs  Lab 05/15/17 1944 05/16/17 0025 05/16/17 0558  LATICACIDVEN 1.9 1.7 1.1   ABG Recent Labs  Lab 05/15/17 1330 05/16/17 0420 05/19/17 0436  PHART 7.343* 7.374 7.399  PCO2ART 48.2* 46.0 49.7*  PO2ART 88.0 120* 69.4*   Liver Enzymes Recent Labs  Lab 05/16/17 0601 05/19/17 0336 05/20/17 0406  AST 36 36 38  ALT 18 23 23   ALKPHOS 22* 48 58  BILITOT 0.6 0.5 1.1  ALBUMIN 2.9* 2.3* 2.6*   Cardiac Enzymes No results for input(s): TROPONINI, PROBNP in the last 168 hours.  Glucose Recent Labs  Lab 05/19/17 1125 05/19/17 1530 05/19/17 1951 05/19/17 2333 05/20/17 0323 05/20/17 0847  GLUCAP 128* 112* 138* 119* 146*  125*   Imaging No results found. STUDIES:  CT Abdomen (11/16):  1. Irregular filling defect within the proximal superior mesenteric artery, concerning for thrombus. Vascular surgery consultation recommended. Correlation with serum lactate suggested. No CT findings to suggest acute bowel ischemia at this time. 2. No other acute intra-abdominal or pelvic process. 3. 3 mm nonobstructive right renal nephrolithiasis. 4. Sequelae of prior prostatectomy.  ANTIBIOTICS: Ertapenem 11/17 x1 (OR) Cefuroxime 11/17 x1 (OR) Cefepime 05/20/2017 in OR>>  SIGNIFICANT EVENTS: 11/16  Presented to [facility] with abd pain >> appears  to be SMA clot per Abdominal CT 11/17  Tx to [facility] & underwent emergent surgery for SMA dissection with clot >> surgically repaired, 2L EBL > to ICU 05/20/2017 taken urgently to the operating room for wound dehiscence and evacuation of intra-abdominal hematoma. LINES/TUBES: 18G PIV, 22G PIV Left Radial Aline 11/17 >>11/19 Foley catheter 11/17 >> ETT 11/17 >>11/19 or intubated 11/22>> PICC 11/20>>>  ASSESSMENT / PLAN: [name] with hx HTN, OSA, Prostate cancer s/p resection, who presented to the [facility] ER on 11/16 PM c/o sudden onset excruciating mid-abdominal pain that began 11/16 at 8pm. CT showed questionable SMA clot. Patient started on heparin and transferred emergently to [facility] where he was brought to the OR and mesenteric angiogram showed it to actually be an SMA dissection, with multiple false lumens and with clot within it. Dr Bridgett Larsson says the OR case was long and complicated with approx 2 LITERS of blood loss. Postop keep intubated. 05/20/2017 taken to the OR urgently at 0 900 for wound dehiscence and arterial bleed.  Returned from the OR 05/21/1999 with repair of dehiscence intra-abdominal hematoma removal.  PULMONARY A: Acute Hypoxic Respiratory failure OSA P: O2 as needed Pulmonary toilet  CARDIOVASCULAR A: SMA Dissection and SMA Clot s/p Surgical Repair Hemorrhagic Shock - resolved.  Off pressors.  Hx HTN HTN refractory P: Goal SBP < 120 PRN labetalol, hydralazine for SBP goals Add scheduled IV lopressor 5 mg IV q6 Heparin gtt per pharmacy  Will need work up for possible Marfan's or Fredderick Phenix danlos given unusual SMA dissection w/o aortic dissection in a young patient, even with chronically uncontrolled HTN Control hypertension as needed  RENAL Lab Results  Component Value Date   CREATININE 0.85 05/20/2017   CREATININE 0.85 05/19/2017   CREATININE 0.85 05/19/2017   Recent Labs  Lab 05/19/17 0336 05/19/17 2100 05/20/17 0406  K 3.3* 3.5 3.7   Recent Labs  Lab 05/19/17 0336  05/19/17 2100 05/20/17 0406  NA 138 137 138    A: AKI resolved  hypokalemia resolved  hypophosphatemia P: Trend BMP / urinary output Replace electrolytes as indicated Avoid nephrotoxic agents, ensure adequate renal perfusion TPN tolerated KVO IVF  GASTROINTESTINAL A: At Risk Malnutrition  Ileus P: NPO  Pepcid for stress ulcer prophylaxis  TPN for ileus Surgery to decide when to use GI tract  HEMATOLOGIC Recent Labs    05/19/17 0336 05/20/17 0406  HGB 7.3* 9.2*    A: Acute Blood Loss - in setting of SMA dissection, s/p surgical repair  Prostate cancer s/p resection Acute blood loss anemia on 05/20/2017 with wound dehiscence and an intra-abdominal hematoma evacuation. 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  He may be able to get a stepdown status and transfer to the [facility] service in the a.m. of 05/21/2017  - Inter-disciplinary family meet or Palliative Care meeting due by: 05/22/17  CCT 30 min   Richardson Landry Minor ACNP Maryanna Shape PCCM Pager [phone] till 1 pm If no answer page [phone] 05/20/2017, 12:08 PM

## 2017-05-21 ENCOUNTER — Encounter (HOSPITAL_COMMUNITY): Payer: Self-pay | Admitting: General Practice

## 2017-05-21 ENCOUNTER — Inpatient Hospital Stay (HOSPITAL_COMMUNITY): Payer: 59

## 2017-05-21 LAB — GLUCOSE, CAPILLARY
GLUCOSE-CAPILLARY: 147 mg/dL — AB (ref 65–99)
GLUCOSE-CAPILLARY: 138 mg/dL — AB (ref 65–99)
GLUCOSE-CAPILLARY: 119 mg/dL — AB (ref 65–99)
Glucose-Capillary: 146 mg/dL — ABNORMAL HIGH (ref 65–99)
Glucose-Capillary: 152 mg/dL — ABNORMAL HIGH (ref 65–99)

## 2017-05-21 LAB — CBC
HEMATOCRIT: 25.6 % — AB (ref 39.0–52.0)
Hemoglobin: 8.2 g/dL — ABNORMAL LOW (ref 13.0–17.0)
MCH: 28.7 pg (ref 26.0–34.0)
MCHC: 32 g/dL (ref 30.0–36.0)
MCV: 89.5 fL (ref 78.0–100.0)
PLATELETS: 182 10*3/uL (ref 150–400)
RBC: 2.86 MIL/uL — AB (ref 4.22–5.81)
RDW: 16.9 % — AB (ref 11.5–15.5)
WBC: 10.7 10*3/uL — AB (ref 4.0–10.5)

## 2017-05-21 LAB — HEPARIN LEVEL (UNFRACTIONATED)
Heparin Unfractionated: 0.24 IU/mL — ABNORMAL LOW (ref 0.30–0.70)
Heparin Unfractionated: 0.28 IU/mL — ABNORMAL LOW (ref 0.30–0.70)
Heparin Unfractionated: 0.32 IU/mL (ref 0.30–0.70)

## 2017-05-21 MED ORDER — LIDOCAINE 2% (20 MG/ML) 5 ML SYRINGE
INTRAMUSCULAR | Status: AC
  Filled 2017-05-21: qty 10

## 2017-05-21 MED ORDER — METOPROLOL TARTRATE 5 MG/5ML IV SOLN
INTRAVENOUS | Status: AC
  Filled 2017-05-21: qty 5

## 2017-05-21 MED ORDER — DIPHENHYDRAMINE HCL 50 MG/ML IJ SOLN
INTRAMUSCULAR | Status: AC
  Filled 2017-05-21: qty 1

## 2017-05-21 MED ORDER — TRAVASOL 10 % IV SOLN
INTRAVENOUS | Status: AC
  Administered 2017-05-21: 18:00:00 via INTRAVENOUS
  Filled 2017-05-21: qty 1152

## 2017-05-21 NOTE — Progress Notes (Signed)
ANTICOAGULATION CONSULT NOTE   Pharmacy Consult for IV Heparin Indication: superior mesenteric vein thrombus/VTE  No Known Allergies  Patient Measurements: Height: 5\' 6"  (167.6 cm) Weight: 222 lb 0.1 oz (100.7 kg) IBW/kg (Calculated) : 63.8 Heparin Dosing Weight: 87 kg  Vital Signs: Temp: 98.4 F (36.9 C) (11/22 2340) Temp Source: Oral (11/22 2340) BP: 164/94 (11/23 0400) Pulse Rate: 109 (11/23 0400)  Labs: Recent Labs    05/19/17 0336 05/19/17 1000 05/19/17 2100 05/20/17 0406 05/21/17 0352  HGB 7.3*  --   --  9.2*  --   HCT 21.7*  --   --  26.9*  --   PLT 215  --   --  228  --   HEPARINUNFRC 0.37 0.38  --  0.29* 0.24*  CREATININE 0.85  --  0.85 0.85  --     Estimated Creatinine Clearance: 120.7 mL/min (by C-G formula based on SCr of 0.85 mg/dL).  Assessment: 17 yoM c/o abdominal pain found to have superior mesenteric artery thrombus. Heparin reordered post-op.   NG output noticed to be bloody this am, upon removal of stables further oozing occurred so heparin off and planning for exploration in OR of intraabdominal source. CBC up to 9.2 s/p 2 units pRBCs yesterday.  -Heparin re-started last night s/p OR -Heparin level low this AM, no issues per RN.   Goal of Therapy:  Heparin level 0.3-0.7 units/ml Monitor platelets by anticoagulation protocol: Yes   Plan:  -Inc heparin to 1900 units/hr -[place], PharmD, BCPS Clinical Pharmacist Phone: [phone]

## 2017-05-21 NOTE — Progress Notes (Addendum)
PHARMACY - ADULT TOTAL PARENTERAL NUTRITION CONSULT NOTE   Pharmacy Consult for TPN Indication: Prolonged Ileus  Patient Measurements: Height: 5\' 6"  (167.6 cm) Weight: 222 lb 0.1 oz (100.7 kg) IBW/kg (Calculated) : 63.8 TPN AdjBW (KG): 72.3 Body mass index is 35.83 kg/m.  Assessment:  46 yo M presents on 11/17 with abdominal pain. Also reported nausea and emesis. PMH of cancer, HTN, and low back pain. CT was ordered and found SMA embolism. 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 Has been NPO since admit, trialed TFs and unable to titrate due to abdominal distension and no bowel sounds. Pharmacy consulted to start TPN due to prolonged ileus.  GI: Prolonged ileus s/p OR on 11/18. Failed tube feed trials with abdominal distension and no bowel sounds. Still without flatus/BM/bowel sounds. NG output high at 1,170mL yesterday. Albumin slightly low at 2.6. Prealbumin low at 6.5. Pepcid in TPN Endo: CBGs mostly controlled (120-170s) on SSI Insulin requirements in the past 24 hours: 8 units Lytes: No BMET, Mag, or Phos today. Phos and K were low yesterday and KPhos 27mmol given outside of TPN. CoCa ok at 9.2. Mg was ok at 2.2 (goal > 2 with ileus) Renal: SCr stable. BUN wnl. D5LR at 79ml/hr Pulm: Down to RA  Cards: BP elevated, ST in 100-120s. Off nicardipine gtt. On metoprolol 5 IV q6h and heparin gtt. Heparin stopped due to coffee ground emesis from NG tube 11/22 AM but resumed that PM. Hepatobil: LFTs ok. Tbili wnl but up to 1.1. Trig elevated at 453 Neuro: Dilaudid PCA, Pain score high 11/21, now improving.  ID: On cefepime. Afebrile yesterday, WBC stable at 10.7.  Best Practices: Hep gtt TPN Access: PICC triple lumen TPN start date: 11/20 >>  Nutritional Goals (per RD recommendation on 11/20): KCal: 1800-2000 Protein: 105-120 g Fluid: > 1.8 L  Current Nutrition:  NPO  Plan:  Continue TPN to 80  ml/hr tonight Hold 20% lipid emulsion for first 7 days for ICU patients per ASPEN guidelines (Start date 11/27) This TPN provides 115 g of protein, 384 g of dextrose, and 0 g of lipids for a total of 1,766 kcals, which meets ~100% of patient needs Electrolytes in TPN: no changes today- prior increased phos (25 mmol/L providing 48 mmol/day) and increased potassium (60 mEq/L providing 115 mEq/day). Cl:Ac ratio 1:1. Add MVI, trace elements, and famotidine to TPN Continue moderate SSI and adjust as needed  Monitor TPN labs, Bmet and Phos tomorrow  Sloan Leiter, PharmD, BCPS, BCCCP Clinical Pharmacist Clinical phone 05/21/2017 until 3:30PM - [phone] After hours, please call #[phone] 05/21/2017 7:32 AM

## 2017-05-21 NOTE — Progress Notes (Signed)
PULMONARY / CRITICAL CARE MEDICINE   Name: Marcus Pruitt MRN: [id number] DOB: [birth year]    ADMISSION DATE:  05/14/2017 CONSULTATION DATE:  05/15/17  REFERRING MD: Dr Bridgett Larsson  CHIEF COMPLAINT: SMA dissection; Vent management  HISTORY OF PRESENT ILLNESS:   77yoM with hx HTN, OSA, Prostate cancer s/p resection, who presented to the [facility] ER on 11/16 PM c/o sudden onset excruciating mid-abdominal pain that began 11/16 at 8pm. CT showed questionable SMA clot. Patient started on heparin and transferred emergently to [facility] where he was brought to the OR and mesenteric angiogram showed it to actually be an SMA dissection, with multiple false lumens and with clot within it. Dr Bridgett Larsson reported the OR case was long and complicated with approx 2 LITERS of blood loss. Went back to OR 11/22 for abdominal wound dehiscence and evacuation of hematoma.  SUBJECTIVE:  Taken urgently to the OR 05/20/2017 for wound dehiscence and evacuation of intra-abdominal hematoma.  Returned from the OR awake alert and not requiring mechanical ventilatory support.  Reports pain is 2/10 this AM. Still not passing gas. Wanting to get out of bed into chair today.  VITAL SIGNS: BP (!) 149/84   Pulse 95   Temp 98.8 F (37.1 C) (Oral)   Resp 18   Ht 5\' 6"  (1.676 m)   Wt 222 lb 0.1 oz (100.7 kg)   SpO2 99%   BMI 35.83 kg/m   HEMODYNAMICS:    VENTILATOR SETTINGS:    INTAKE / OUTPUT: I/O last 3 completed shifts: In: 9518 [I.V.:4026; NG/GT:30; IV Piggyback:672] Out: 8416 [SAYTK:1601; Emesis/NG output:1600; Other:400; Blood:200]  PHYSICAL EXAMINATION: General: Awake alert, NAD; asking for assistance getting out of bed today HEENT: [place] in place; NG in place, EOMI, sclera clear PSY: appropriate  Neuro: Follows commands, moves all extremities x4.  CV: Heart sounds are regular rate and rhythm; strong peripheral pulses PULM: Clear to auscultation GI: Abdomen is tender and distended.  No bowel sounds appreciated, abdominal dressing  dry and intact extremities: Warm with trace edema Skin: Warm and dry   LABS:  BMET Recent Labs  Lab 05/19/17 0336 05/19/17 2100 05/20/17 0406  NA 138 137 138  K 3.3* 3.5 3.7  CL 103 102 104  CO2 28 27 26   BUN 9 10 12   CREATININE 0.85 0.85 0.85  GLUCOSE 139* 129* 128*   Electrolytes Recent Labs  Lab 05/18/17 0332 05/19/17 0336 05/19/17 2100 05/20/17 0406  CALCIUM 7.9* 7.9* 8.1* 8.3*  MG 2.2 2.3  --  2.2  PHOS 1.4* 1.8*  --  2.0*   CBC Recent Labs  Lab 05/19/17 0336 05/20/17 0406 05/21/17 0514  WBC 11.6* 10.6* 10.7*  HGB 7.3* 9.2* 8.2*  HCT 21.7* 26.9* 25.6*  PLT 215 228 182   Coag's Recent Labs  Lab 05/15/17 0031 05/15/17 1230 05/15/17 2353  APTT 23* 28 134*  INR 0.97 1.19 1.24   Sepsis Markers Recent Labs  Lab 05/15/17 1944 05/16/17 0025 05/16/17 0558  LATICACIDVEN 1.9 1.7 1.1   ABG Recent Labs  Lab 05/15/17 1330 05/16/17 0420 05/19/17 0436  PHART 7.343* 7.374 7.399  PCO2ART 48.2* 46.0 49.7*  PO2ART 88.0 120* 69.4*   Liver Enzymes Recent Labs  Lab 05/16/17 0601 05/19/17 0336 05/20/17 0406  AST 36 36 38  ALT 18 23 23   ALKPHOS 22* 48 58  BILITOT 0.6 0.5 1.1  ALBUMIN 2.9* 2.3* 2.6*   Cardiac Enzymes No results for input(s): TROPONINI, PROBNP in the last 168 hours.  Glucose Recent Labs  Lab  05/20/17 0323 05/20/17 0847 05/20/17 1259 05/20/17 1706 05/20/17 2100 05/20/17 2351  GLUCAP 146* 125* 177* 135* 138* 145*   Imaging Dg Chest Port 1 View  Result Date: 05/21/2017 CLINICAL DATA:  Respiratory failure EXAM: PORTABLE CHEST 1 VIEW COMPARISON:  05/19/2017 FINDINGS: Right subclavian stent is catheter: Cavoatrial junction. Nasogastric tube enters the stomach. Low lung volumes are present, causing crowding of the pulmonary vasculature. Right infrahilar airspace opacity is similar to prior. Accounting for the low lung volumes, left lung appears grossly clear. IMPRESSION: 1. Mild right infrahilar airspace opacity, favoring  atelectasis over pneumonia given the low lung volumes. Electronically Signed   By: Van Clines M.D.   On: 05/21/2017 07:58   STUDIES:  CT Abdomen (11/16):  1. Irregular filling defect within the proximal superior mesenteric artery, concerning for thrombus. Vascular surgery consultation recommended. Correlation with serum lactate suggested. No CT findings to suggest acute bowel ischemia at this time. 2. No other acute intra-abdominal or pelvic process. 3. 3 mm nonobstructive right renal nephrolithiasis. 4. Sequelae of prior prostatectomy.  ANTIBIOTICS: Ertapenem 11/17 x1 (OR) Cefuroxime 11/17 x1 (OR) Cefepime 05/20/2017 in OR>>  SIGNIFICANT EVENTS: 11/16  Presented to [facility] with abd pain >> appears to be SMA clot per Abdominal CT 11/17  Tx to [facility] & underwent emergent surgery for SMA dissection with clot >> surgically repaired, 2L EBL > to ICU 05/20/2017 taken urgently to the operating room for wound dehiscence and evacuation of intra-abdominal hematoma. LINES/TUBES: 18G PIV, 22G PIV Left Radial Aline 11/17 >>11/19 Foley catheter 11/17 >> ETT 11/17 >>11/19 or intubated 11/22>> PICC 11/20>>>  ASSESSMENT / PLAN: [name] with hx HTN, OSA, Prostate cancer s/p resection, who presented to the [facility] ER on 11/16 PM c/o sudden onset excruciating mid-abdominal pain that began 11/16 at 8pm. CT showed questionable SMA clot. Patient started on heparin and transferred emergently to [facility] where he was brought to the OR and mesenteric angiogram showed it to actually be an SMA dissection, with multiple false lumens and with clot within it. Dr Bridgett Larsson says the OR case was long and complicated with approx 2 LITERS of blood loss. Postop keep intubated. 05/20/2017 taken to the OR urgently at 0 900 for wound dehiscence and arterial bleed.  Returned from the OR 05/21/1999 with repair of dehiscence intra-abdominal hematoma removal.  PULMONARY A: Acute Hypoxic Respiratory failure (resolved) OSA P: O2 as  needed Pulmonary toilet  CARDIOVASCULAR A: SMA Dissection and SMA Clot s/p Surgical Repair Hemorrhagic Shock - resolved.  Off pressors.  Hx HTN HTN refractory P: Goal SBP < 120 PRN labetalol, hydralazine for SBP goals Add scheduled IV lopressor 5 mg IV q6 Heparin gtt per pharmacy  On Lisinopril-HCTZ as outpatient, but unable to use gut currently given recent surgery and ileus   RENAL Lab Results  Component Value Date   CREATININE 0.85 05/20/2017   CREATININE 0.85 05/19/2017   CREATININE 0.85 05/19/2017   Recent Labs  Lab 05/19/17 0336 05/19/17 2100 05/20/17 0406  K 3.3* 3.5 3.7   Recent Labs  Lab 05/19/17 0336 05/19/17 2100 05/20/17 0406  NA 138 137 138    A: AKI resolved  hypokalemia resolved  hypophosphatemia P: Trend BMP / urinary output Replace electrolytes as indicated Avoid nephrotoxic agents, ensure adequate renal perfusion TPN tolerated, pharmacy consult following KVO IVF  GASTROINTESTINAL A: At Risk Malnutrition  Ileus P: NPO  Pepcid for stress ulcer prophylaxis  TPN for ileus Surgery to decide when to use GI tract Up and out of bed as  able  HEMATOLOGIC Recent Labs    05/20/17 0406 05/21/17 0514  HGB 9.2* 8.2*    A: Acute Blood Loss - in setting of SMA dissection, s/p surgical repair  Prostate cancer s/p resection Acute blood loss anemia on 05/20/2017 with wound dehiscence and an intra-abdominal hematoma evacuation. 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 Now stepdown status and could likely transfer to the [facility] AM today 05/21/17 given he remains extubated and hemodynamically stable  - Inter-disciplinary family meet or Palliative Care meeting due by: 05/22/17  Patient care time 25 minutes   Marlise Eves, MD [place] 9:06 AM, 05/21/17

## 2017-05-21 NOTE — Progress Notes (Signed)
[facility]

## 2017-05-21 NOTE — Progress Notes (Signed)
ANTICOAGULATION CONSULT NOTE   Pharmacy Consult for IV Heparin Indication: superior mesenteric vein thrombus/VTE  No Known Allergies  Patient Measurements: Height: 5\' 6"  (167.6 cm) Weight: 222 lb 0.1 oz (100.7 kg) IBW/kg (Calculated) : 63.8 Heparin Dosing Weight: 87 kg  Vital Signs: Temp: 100.7 F (38.2 C) (11/23 1204) Temp Source: Oral (11/23 1204) BP: 148/88 (11/23 1204) Pulse Rate: 110 (11/23 1300)  Labs: Recent Labs    05/19/17 0336  05/19/17 2100 05/20/17 0406 05/21/17 0352 05/21/17 0514 05/21/17 1322  HGB 7.3*  --   --  9.2*  --  8.2*  --   HCT 21.7*  --   --  26.9*  --  25.6*  --   PLT 215  --   --  228  --  182  --   HEPARINUNFRC 0.37   < >  --  0.29* 0.24*  --  0.28*  CREATININE 0.85  --  0.85 0.85  --   --   --    < > = values in this interval not displayed.    Estimated Creatinine Clearance: 120.7 mL/min (by C-G formula based on SCr of 0.85 mg/dL).  Assessment: 63 yoM c/o abdominal pain found to have superior mesenteric artery thrombus. Heparin temporarily held due to bloody OG output but OR revealed old hematoma which was evacuated and heparin resumed.   Heparin level subtherapeutic at 0.28, no s/Sx bleeding noted today.  Goal of Therapy:  Heparin level 0.3-0.7 units/ml Monitor platelets by anticoagulation protocol: Yes   Plan:  -Increase heparin slightly to 2000 units/hr -Recheck 6-hr heparin level -Daily heparin level, CBC, S/Sx bleeding  Arrie Senate, PharmD, BCPS PGY-2 Cardiology Pharmacy Resident Pager: [phone] 05/21/2017

## 2017-05-21 NOTE — Progress Notes (Signed)
ANTICOAGULATION CONSULT NOTE   Pharmacy Consult for IV Heparin Indication: superior mesenteric vein thrombus/VTE  No Known Allergies  Patient Measurements: Height: 5\' 6"  (167.6 cm) Weight: 222 lb 0.1 oz (100.7 kg) IBW/kg (Calculated) : 63.8 Heparin Dosing Weight: 87 kg  Vital Signs: Temp: 99.1 F (37.3 C) (11/23 1900) Temp Source: Oral (11/23 1900) BP: 155/97 (11/23 2030) Pulse Rate: 110 (11/23 2100)  Labs: Recent Labs    05/19/17 0336  05/19/17 2100 05/20/17 0406 05/21/17 0352 05/21/17 0514 05/21/17 1322 05/21/17 2028  HGB 7.3*  --   --  9.2*  --  8.2*  --   --   HCT 21.7*  --   --  26.9*  --  25.6*  --   --   PLT 215  --   --  228  --  182  --   --   HEPARINUNFRC 0.37   < >  --  0.29* 0.24*  --  0.28* 0.32  CREATININE 0.85  --  0.85 0.85  --   --   --   --    < > = values in this interval not displayed.    Estimated Creatinine Clearance: 120.7 mL/min (by C-G formula based on SCr of 0.85 mg/dL).  Assessment: 47 yoM c/o abdominal pain found to have superior mesenteric artery thrombus. Heparin temporarily held due to bloody OG output but OR revealed old hematoma which was evacuated and heparin resumed.  -heparin at goal after increase to 2000 units/hr  Goal of Therapy:  Heparin level 0.3-0.7 units/ml Monitor platelets by anticoagulation protocol: Yes   Plan:   -No heparin changes needed -Daily heparin level, CBC  Hildred Laser, Pharm D 05/21/2017 9:25 PM

## 2017-05-21 NOTE — Progress Notes (Signed)
   VASCULAR SURGERY ASSESSMENT & PLAN:   1 Day Post-Op s/p: Repair of abdominal dehiscence and evacuation of intra-abdominal hematoma  GI/NUTRITION: Still no flatus, BM, or bowel sounds.  His intra-abdominal hematoma may have been contributing to his ileus.  His bowel appeared well perfused.  We will continue NG tube until bowel function returns and then hopefully can start diet soon.  The NG tube has had minimal drainage.  However, in the meantime he is receiving TPN.  Increase activity.  His bladder was quite distended intraoperatively.  Therefore I would leave the Foley another 24 hours and then discontinue.  SUBJECTIVE:   No flatus.  No BM.  Pain under good control.  No nausea.  PHYSICAL EXAM:   Vitals:   05/21/17 0200 05/21/17 0300 05/21/17 0400 05/21/17 0500  BP: (!) 145/82 (!) 153/94 (!) 164/94 (!) 152/90  Pulse: (!) 114 (!) 114 (!) 109 (!) 109  Resp: (!) 23 20 (!) 21 17  Temp:   99.4 F (37.4 C)   TempSrc:   Oral   SpO2: 100% 98% 100% 100%  Weight:   222 lb 0.1 oz (100.7 kg)   Height:       Abdomen distended.  No bowel sounds.  Dressing dry. Lungs clear bilaterally to auscultation.  LABS:   Lab Results  Component Value Date   WBC 10.7 (H) 05/21/2017   HGB 8.2 (L) 05/21/2017   HCT 25.6 (L) 05/21/2017   MCV 89.5 05/21/2017   PLT 182 05/21/2017   Lab Results  Component Value Date   CREATININE 0.85 05/20/2017   Lab Results  Component Value Date   INR 1.24 05/15/2017   CBG (last 3)  Recent Labs    05/20/17 1706 05/20/17 2100 05/20/17 2351  GLUCAP 135* 138* 145*    PROBLEM LIST:    Active Problems:   Mesenteric artery vascular embolism (HCC)   Vascular disorder of small intestine (HCC)   Dissection of unspecified artery (HCC)   CURRENT MEDS:   . chlorhexidine  15 mL Mouth Rinse BID  . Chlorhexidine Gluconate Cloth  6 each Topical Daily  . HYDROmorphone   Intravenous Q4H  . insulin aspart  0-15 Units Subcutaneous Q4H  . mouth rinse  15 mL  Mouth Rinse q12n4p  . metoprolol tartrate  5 mg Intravenous Q6H  . sodium chloride flush  10-40 mL Intracatheter [place]: [phone] Office: [phone] 05/21/2017

## 2017-05-21 NOTE — Progress Notes (Signed)
Unable to wear BiPAP due to NG tube.

## 2017-05-22 DIAGNOSIS — K559 Vascular disorder of intestine, unspecified: Secondary | ICD-10-CM

## 2017-05-22 LAB — GLUCOSE, CAPILLARY
GLUCOSE-CAPILLARY: 127 mg/dL — AB (ref 65–99)
GLUCOSE-CAPILLARY: 148 mg/dL — AB (ref 65–99)
Glucose-Capillary: 148 mg/dL — ABNORMAL HIGH (ref 65–99)
Glucose-Capillary: 140 mg/dL — ABNORMAL HIGH (ref 65–99)
Glucose-Capillary: 130 mg/dL — ABNORMAL HIGH (ref 65–99)

## 2017-05-22 LAB — CBC
HCT: 24.8 % — ABNORMAL LOW (ref 39.0–52.0)
HEMOGLOBIN: 8.3 g/dL — AB (ref 13.0–17.0)
MCH: 27.9 pg (ref 26.0–34.0)
MCHC: 33.5 g/dL (ref 30.0–36.0)
MCV: 83.5 fL (ref 78.0–100.0)
Platelets: 230 10*3/uL (ref 150–400)
RBC: 2.97 MIL/uL — AB (ref 4.22–5.81)
RDW: 15.9 % — ABNORMAL HIGH (ref 11.5–15.5)
WBC: 13.6 10*3/uL — AB (ref 4.0–10.5)

## 2017-05-22 LAB — BASIC METABOLIC PANEL
ANION GAP: 6 (ref 5–15)
BUN: 14 mg/dL (ref 6–20)
CALCIUM: 8.4 mg/dL — AB (ref 8.9–10.3)
CO2: 27 mmol/L (ref 22–32)
Chloride: 104 mmol/L (ref 101–111)
Creatinine, Ser: 0.83 mg/dL (ref 0.61–1.24)
Glucose, Bld: 148 mg/dL — ABNORMAL HIGH (ref 65–99)
Potassium: 4.4 mmol/L (ref 3.5–5.1)
SODIUM: 137 mmol/L (ref 135–145)

## 2017-05-22 LAB — PHOSPHORUS: PHOSPHORUS: 3.7 mg/dL (ref 2.5–4.6)

## 2017-05-22 LAB — MAGNESIUM: MAGNESIUM: 2.1 mg/dL (ref 1.7–2.4)

## 2017-05-22 LAB — HEPARIN LEVEL (UNFRACTIONATED): Heparin Unfractionated: 0.3 IU/mL (ref 0.30–0.70)

## 2017-05-22 MED ORDER — MIDAZOLAM HCL 2 MG/2ML IJ SOLN
INTRAMUSCULAR | Status: AC
  Filled 2017-05-22: qty 2

## 2017-05-22 MED ORDER — METOPROLOL TARTRATE 5 MG/5ML IV SOLN
10.0000 mg | Freq: Four times a day (QID) | INTRAVENOUS | Status: DC
  Administered 2017-05-22 – 2017-05-28 (×24): 10 mg via INTRAVENOUS
  Filled 2017-05-22 (×26): qty 10

## 2017-05-22 MED ORDER — TRAVASOL 10 % IV SOLN
INTRAVENOUS | Status: AC
  Administered 2017-05-22: 19:00:00 via INTRAVENOUS
  Filled 2017-05-22: qty 1152

## 2017-05-22 MED ORDER — SODIUM CHLORIDE 0.9 % IV SOLN
250.0000 mL | INTRAVENOUS | Status: DC | PRN
  Administered 2017-05-26: 250 mL via INTRAVENOUS

## 2017-05-22 MED ORDER — FENTANYL CITRATE (PF) 100 MCG/2ML IJ SOLN
INTRAMUSCULAR | Status: AC
  Filled 2017-05-22: qty 2

## 2017-05-22 NOTE — Consult Note (Signed)
[facility] TEAM 1 - Stepdown/ICU TEAM CONSULT F/U NOTE  Webb Weed NFA:[id number] DOB: Aug 09, 1970 DOA: 05/14/2017 PCP: Laurey Morale, MD  Admit HPI / Brief Narrative: 46yo M with hx HTN, OSA, and Prostate cancer s/p resection who presented to the [facility] ER on 11/16 c/o sudden onset excruciating mid-abdominal pain. CT showed questionable SMA clot. Patient was started on heparin and transferred emergently to [facility] where he was brought to the OR and mesenteric angiogram showed it to actually be an SMA dissection, with multiple false lumens and a clot within it. Dr Bridgett Larsson reported the OR case was long and complicated with approx 2 L of blood loss. Went back to OR 11/22 for abdominal wound dehiscence and evacuation of a large hematoma.  [facility] was asked by PCCM to assume the role of medical consultant as of 11/24.  PCCM was following the pt primarily for vent management while the pt was in the ICU.  The Vascular Surgery service is the attending of record.    HPI/Subjective: Resting comfortably in a bedside chair.  Has been up w/ PT.  Denies cp, sob, n/v.    Recommendations/Plan:  SMA Dissection and SMA Clot s/p Surgical Repair - Repair of abdominal dehiscence and evacuation of intra-abdominal hematoma  Care per Vascular Surgery   Hemorrhagic Shock - acute blood loss anemia  Resolved s/p transfusion of 4U PRBC - follow trend   Acute Hypoxic Respiratory failure Postoperatively - primarily related to acute illness and complex nature of surgery - resolved w/ pt now on minimal Calcium O2 support  OSA Cont CPAP per home regimen   HTN  BP not severely elevated but not yet at goal - adjust tx and follow  Ileus Currently on TPN - care as per surgical team - NG being removed today - maximize K+ and Mg levels   Prostate cancer s/p robotic prostatectomy and pelvic lymph node dissection  Acute Encephalopathy  Due to acute critical illness - resolved   Code Status: FULL Family Communication: spoke w/ family  at bedside   Antibiotics: Cefepime 11/19 >  DVT prophylaxis: Heparin gtt   Objective: Blood pressure (!) 159/97, pulse 95, temperature 99 F (37.2 C), temperature source Oral, resp. rate 19, height 5\' 6"  (1.676 m), weight 100.5 kg (221 lb 8 oz), SpO2 98 %.  Intake/Output Summary (Last 24 hours) at 05/22/2017 0925 Last data filed at 05/22/2017 0900 Gross per 24 hour  Intake 2644 ml  Output 3390 ml  Net -746 ml    Exam: General: No acute respiratory distress Lungs: Clear to auscultation bilaterally without wheezes or crackles Cardiovascular: Regular rate and rhythm without murmur gallop or rub normal S1 and S2 Abdomen: distended, BS hypoactive, dressing intact, no rebound  Extremities: No significant cyanosis, clubbing, or edema bilateral lower extremities  Data Reviewed: Basic Metabolic Panel: Recent Labs  Lab 05/16/17 0601  05/18/17 0332 05/19/17 0336 05/19/17 2100 05/20/17 0406 05/22/17 0442  NA 137   < > 139 138 137 138 137  K 3.9   < > 3.6 3.3* 3.5 3.7 4.4  CL 107   < > 105 103 102 104 104  CO2 27   < > 27 28 27 26 27   GLUCOSE 132*   < > 139* 139* 129* 128* 148*  BUN 11   < > 6 9 10 12 14   CREATININE 1.01   < > 0.75 0.85 0.85 0.85 0.83  CALCIUM 7.3*   < > 7.9* 7.9* 8.1* 8.3* 8.4*  MG 2.2  --  2.2 2.3  --  2.2 2.1  PHOS 2.3*  --  1.4* 1.8*  --  2.0* 3.7   < > = values in this interval not displayed.    CBC: Recent Labs  Lab 05/18/17 0332 05/19/17 0336 05/20/17 0406 05/21/17 0514 05/22/17 0442  WBC 13.3* 11.6* 10.6* 10.7* 13.6*  HGB 8.4* 7.3* 9.2* 8.2* 8.3*  HCT 25.7* 21.7* 26.9* 25.6* 24.8*  MCV 83.7 82.8 82.5 89.5 83.5  PLT 159 215 228 182 230    Liver Function Tests: Recent Labs  Lab 05/16/17 0601 05/19/17 0336 05/20/17 0406  AST 36 36 38  ALT 18 23 23   ALKPHOS 22* 48 58  BILITOT 0.6 0.5 1.1  PROT 4.8* 5.6* 6.0*  ALBUMIN 2.9* 2.3* 2.6*   Recent Labs  Lab 05/15/17 1230 05/16/17 0601 05/18/17 0332  LIPASE 58*  --  18  AMYLASE 126*  104* 65    Coags: Recent Labs  Lab 05/15/17 1230 05/15/17 2353  INR 1.19 1.24   Recent Labs  Lab 05/15/17 1230 05/15/17 2353  APTT 28 134*    CBG: Recent Labs  Lab 05/21/17 1158 05/21/17 1604 05/21/17 2005 05/22/17 0014 05/22/17 0750  GLUCAP 146* 152* 119* 148* 148*    Recent Results (from the past 240 hour(s))  MRSA PCR Screening     Status: None   Collection Time: 05/15/17  1:17 PM  Result Value Ref Range Status   MRSA by PCR NEGATIVE NEGATIVE Final    Comment:        The GeneXpert MRSA Assay (FDA approved for NASAL specimens only), is one component of a comprehensive MRSA colonization surveillance program. It is not intended to diagnose MRSA infection nor to guide or monitor treatment for MRSA infections.      Studies:   Recent x-ray studies have been reviewed in detail by the Attending Physician  Scheduled Meds:  Scheduled Meds: . fentaNYL      . midazolam      . chlorhexidine  15 mL Mouth Rinse BID  . Chlorhexidine Gluconate Cloth  6 each Topical Daily  . HYDROmorphone   Intravenous Q4H  . insulin aspart  0-15 Units Subcutaneous Q4H  . mouth rinse  15 mL Mouth Rinse q12n4p  . metoprolol tartrate  5 mg Intravenous Q6H  . sodium chloride flush  10-40 mL Intracatheter Q12H   Shala Baumbach T , MD   [facility] Office  [phone] Pager - Text Page per Shea Evans as per below:  On-Call/Text Page:      [email or web address]      password [email or web address]  If 7PM-7AM, please contact night-coverage [email or web address] Password [email or web address] 05/22/2017, 9:25 AM   LOS: 7 days

## 2017-05-22 NOTE — Progress Notes (Signed)
Physical Therapy Treatment Patient Details Name: Marcus Pruitt MRN: [id number] DOB: 03/07/1971 Today's Date: 05/22/2017    History of Present Illness Pt is a 46 y.o. male who presented to the [facility] ER on 05/14/17 with c/o sudden onset excruciating mid-abdominal pain; CT showed questionable SMA clot. Pt started on heparin and transferred emergently to [facility] where he was brought to the OR and mesenteric angiogram showed it to actually be an SMA dissection, with multiple false lumens and with clot within it. Intubated 11/16-11/19. Pertinent PMH includes HTN, OSA, prostate cancer s/p resection, LBP, R ear hearing loss.    PT Comments    Pt continues to progress well with mobility.    Follow Up Recommendations  No PT follow up;Supervision - Intermittent     Equipment Recommendations  Other (comment)(TBD)    Recommendations for Other Services       Precautions / Restrictions Precautions Precautions: None Restrictions Weight Bearing Restrictions: No    Mobility  Bed Mobility               General bed mobility comments: Pt up in chair  Transfers Overall transfer level: Needs assistance Equipment used: Rolling walker (2 wheeled) Transfers: Sit to/from Stand Sit to Stand: Min guard         General transfer comment: Incr time to rise but no physical assist required  Ambulation/Gait Ambulation/Gait assistance: Supervision Ambulation Distance (Feet): 400 Feet Assistive device: Rolling walker (2 wheeled) Gait Pattern/deviations: Step-through pattern;Decreased stride length Gait velocity: Decreased Gait velocity interpretation: Below normal speed for age/gender General Gait Details: Slow gait. Good posture. Supervision for lines/safety. Amb on RA with VSS throughout   Stairs            Wheelchair Mobility    Modified Rankin (Stroke Patients Only)       Balance Overall balance assessment: Needs assistance Sitting-balance support: No upper extremity supported;Feet  supported Sitting balance-Leahy Scale: Good     Standing balance support: No upper extremity supported Standing balance-Leahy Scale: Fair                              Cognition Arousal/Alertness: Awake/alert Behavior During Therapy: WFL for tasks assessed/performed Overall Cognitive Status: Within Functional Limits for tasks assessed                                        Exercises      General Comments        Pertinent Vitals/Pain Pain Assessment: Faces Faces Pain Scale: Hurts little more Pain Location: Abdomen Pain Descriptors / Indicators: Sore;Grimacing Pain Intervention(s): Limited activity within patient's tolerance;PCA encouraged    Home Living                      Prior Function            PT Goals (current goals can now be found in the care plan section) Progress towards PT goals: Progressing toward goals    Frequency    Min 3X/week      PT Plan Current plan remains appropriate    Co-evaluation              AM-PAC PT "6 Clicks" Daily Activity  Outcome Measure  Difficulty turning over in bed (including adjusting bedclothes, sheets and blankets)?: None Difficulty moving from lying on back to sitting on  the side of the bed? : Unable Difficulty sitting down on and standing up from a chair with arms (e.g., wheelchair, bedside commode, etc,.)?: A Little Help needed moving to and from a bed to chair (including a wheelchair)?: A Little Help needed walking in hospital room?: A Little Help needed climbing 3-5 steps with a railing? : A Little 6 Click Score: 17    End of Session   Activity Tolerance: Patient tolerated treatment well Patient left: in chair;with call bell/phone within reach;with family/visitor present Nurse Communication: Mobility status PT Visit Diagnosis: Other abnormalities of gait and mobility (R26.89);Pain Pain - part of body: (Abdomen)     Time: 7793-9030 PT Time Calculation (min)  (ACUTE ONLY): 22 min  Charges:  $Gait Training: 8-22 mins                    G Codes:       [facility] PT [place] 05/22/2017, 9:46 AM

## 2017-05-22 NOTE — Progress Notes (Addendum)
[place] CONSULT NOTE   Pharmacy Consult for TPN Indication: Prolonged Ileus  Patient Measurements: Height: 5\' 6"  (167.6 cm) Weight: 221 lb 8 oz (100.5 kg) IBW/kg (Calculated) : 63.8 TPN AdjBW (KG): 72.3 Body mass index is 35.75 kg/m.  Assessment:  46 yo M presents on 11/17 with abdominal pain. Also reported nausea and emesis. PMH of cancer, HTN, and low back pain. CT was ordered and found SMA embolism. 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 Has been NPO since admit, trialed TFs and unable to titrate due to abdominal distension and no bowel sounds. Pharmacy consulted to start TPN due to prolonged ileus.  GI: Prolonged ileus s/p OR on 11/18. Failed tube feed trials with abdominal distension and no bowel sounds. Now with flatus and BM. Albumin slightly low at 2.6. Prealbumin low at 6.5. Pepcid in TPN. Discontinuing NGT today, but keeping NPO with ice chips. Continues on TPN. Endo: CBGs controlled (120-140s) on SSI Insulin requirements in the past 24 hours: 11 units Lytes: Lytes wnl today. CoCa ok at 9.5.  Renal: SCr stable. BUN wnl. D5LR at 64ml/hr Pulm:2L [place]  Cards: BP elevated, ST in 90-low 100s. Off nicardipine gtt. On metoprolol 5 IV q6h and heparin gtt. Heparin stopped due to coffee ground emesis from NG tube 11/22 AM but resumed that PM. Hepatobil: LFTs ok. Tbili wnl but up to 1.1. Trig elevated at 453 Neuro: Dilaudid PCA, Pain score 2. ID: On cefepime. Tmax 100.1, WBC up 13.6.  Best Practices: Hep gtt TPN Access: PICC triple lumen TPN start date: 11/20 >>  Nutritional Goals (per RD recommendation on 11/20): KCal: 1800-2000 Protein: 105-120 g Fluid: > 1.8 L  Current Nutrition:  NPO with ice chips  Plan:  Continue TPN to 80 ml/hr tonight Hold 20% lipid emulsion for first 7 days for ICU patients per ASPEN guidelines (Start date 11/27) This TPN  provides 115 g of protein, 384 g of dextrose, and 0 g of lipids for a total of 1,766 kcals, which meets ~100% of patient needs Electrolytes in TPN: no changes today- continue with prior increased phos (25 mmol/L providing 48 mmol/day) and increased potassium (60 mEq/L providing 115 mEq/day). Cl:Ac ratio 1:1. Add MVI, trace elements, and famotidine to TPN Continue moderate SSI and adjust as needed  Monitor TPN labs Follow-up advancement of diet and ability to wean TPN  Sloan Leiter, PharmD, BCPS, BCCCP Clinical Pharmacist Clinical phone 05/22/2017 until 3:30PM - #[phone] After hours, please call #[phone] 05/22/2017 7:37 AM

## 2017-05-22 NOTE — Progress Notes (Signed)
   VASCULAR SURGERY ASSESSMENT & PLAN:   POD 7: Repair of superior mesenteric artery dissection POD 2: Repair of abdominal dehiscence and evacuation of intra-abdominal hematoma   GI NUTRITION: He is passing flatus and had a bowel movement.  We will discontinue his NG tube and keep n.p.o. except for ice chips.  He continues to get TNA for nutrition.  His renal function is normal.  Ambulate.  Transferred to 4 E.  Continues IV heparin.  We will need to discuss long-term plans for anticoagulation with Dr. Bridgett Larsson.  SUBJECTIVE:   Had a bowel movement.  Passing flatus.  No nausea.  PHYSICAL EXAM:   Vitals:   05/22/17 0330 05/22/17 0400 05/22/17 0430 05/22/17 0500  BP:  (!) 144/91 (!) 142/70 (!) 146/82  Pulse:  (!) 111 (!) 111 (!) 108  Resp: (!) 21 (!) 22 19 17   Temp:  100.1 F (37.8 C)    TempSrc:  Oral    SpO2:  93% 93% 94%  Weight:      Height:       Abdomen still distended.  Hypoactive bowel sounds. Lungs clear. No significant lower extremity swelling.  LABS:   Lab Results  Component Value Date   WBC 13.6 (H) 05/22/2017   HGB 8.3 (L) 05/22/2017   HCT 24.8 (L) 05/22/2017   MCV 83.5 05/22/2017   PLT 230 05/22/2017   Lab Results  Component Value Date   CREATININE 0.83 05/22/2017   Lab Results  Component Value Date   INR 1.24 05/15/2017   CBG (last 3)  Recent Labs    05/21/17 1604 05/21/17 2005 05/22/17 0014  GLUCAP 152* 119* 148*    PROBLEM LIST:    Active Problems:   Mesenteric artery vascular embolism (HCC)   Vascular disorder of small intestine (HCC)   Dissection of unspecified artery (HCC)   CURRENT MEDS:   . chlorhexidine  15 mL Mouth Rinse BID  . Chlorhexidine Gluconate Cloth  6 each Topical Daily  . HYDROmorphone   Intravenous Q4H  . insulin aspart  0-15 Units Subcutaneous Q4H  . mouth rinse  15 mL Mouth Rinse q12n4p  . metoprolol tartrate  5 mg Intravenous Q6H  . sodium chloride flush  10-40 mL Intracatheter [place]: [phone] Office: [phone] 05/22/2017

## 2017-05-22 NOTE — Progress Notes (Signed)
ANTICOAGULATION CONSULT NOTE   Pharmacy Consult for IV Heparin Indication: superior mesenteric vein thrombus/VTE  No Known Allergies  Patient Measurements: Height: 5\' 6"  (167.6 cm) Weight: 221 lb 8 oz (100.5 kg) IBW/kg (Calculated) : 63.8 Heparin Dosing Weight: 87 kg  Vital Signs: Temp: 100.1 F (37.8 C) (11/24 0400) Temp Source: Oral (11/24 0400) BP: 159/97 (11/24 0640) Pulse Rate: 95 (11/24 0700)  Labs: Recent Labs    05/19/17 2100  05/20/17 0406  05/21/17 0514 05/21/17 1322 05/21/17 2028 05/22/17 0338 05/22/17 0442  HGB  --    < > 9.2*  --  8.2*  --   --   --  8.3*  HCT  --   --  26.9*  --  25.6*  --   --   --  24.8*  PLT  --   --  228  --  182  --   --   --  230  HEPARINUNFRC  --   --  0.29*   < >  --  0.28* 0.32 0.30  --   CREATININE 0.85  --  0.85  --   --   --   --   --  0.83   < > = values in this interval not displayed.    Estimated Creatinine Clearance: 123.5 mL/min (by C-G formula based on SCr of 0.83 mg/dL).  Assessment: 6 yoM c/o abdominal pain found to have superior mesenteric artery thrombus. Heparin temporarily held due to bloody OG output but OR revealed old hematoma which was evacuated and heparin resumed.   Heparin level therapeutic at 0.30, no s/Sx bleeding noted by RN, CBC stable.  Goal of Therapy:  Heparin level 0.3-0.7 units/ml Monitor platelets by anticoagulation protocol: Yes   Plan:  -Increase heparin slightly to 2100 units/hr -Daily heparin level, CBC, S/Sx bleeding  Arrie Senate, PharmD, BCPS PGY-2 Cardiology Pharmacy Resident Pager: [phone] 05/22/2017

## 2017-05-22 NOTE — Progress Notes (Signed)
Patient was transferred to 4E19 with no distress noted. Family at bedside. Report given and no questions. Will transfer care at this time.

## 2017-05-23 LAB — CBC
HEMATOCRIT: 25.7 % — AB (ref 39.0–52.0)
Hemoglobin: 8.4 g/dL — ABNORMAL LOW (ref 13.0–17.0)
MCH: 27.9 pg (ref 26.0–34.0)
MCHC: 32.7 g/dL (ref 30.0–36.0)
MCV: 85.4 fL (ref 78.0–100.0)
Platelets: 308 10*3/uL (ref 150–400)
RBC: 3.01 MIL/uL — ABNORMAL LOW (ref 4.22–5.81)
RDW: 16.4 % — AB (ref 11.5–15.5)
WBC: 16.6 10*3/uL — AB (ref 4.0–10.5)

## 2017-05-23 LAB — HEPARIN LEVEL (UNFRACTIONATED)
Heparin Unfractionated: 0.19 IU/mL — ABNORMAL LOW (ref 0.30–0.70)
Heparin Unfractionated: 0.52 IU/mL (ref 0.30–0.70)

## 2017-05-23 LAB — GLUCOSE, CAPILLARY
GLUCOSE-CAPILLARY: 109 mg/dL — AB (ref 65–99)
GLUCOSE-CAPILLARY: 132 mg/dL — AB (ref 65–99)
GLUCOSE-CAPILLARY: 122 mg/dL — AB (ref 65–99)
GLUCOSE-CAPILLARY: 104 mg/dL — AB (ref 65–99)
Glucose-Capillary: 118 mg/dL — ABNORMAL HIGH (ref 65–99)
Glucose-Capillary: 134 mg/dL — ABNORMAL HIGH (ref 65–99)

## 2017-05-23 MED ORDER — DEXTROSE 5 % IV SOLN
1.0000 g | Freq: Three times a day (TID) | INTRAVENOUS | Status: DC
  Administered 2017-05-23 – 2017-05-24 (×2): 1 g via INTRAVENOUS
  Filled 2017-05-23 (×3): qty 1

## 2017-05-23 MED ORDER — ALTEPLASE 2 MG IJ SOLR
2.0000 mg | Freq: Once | INTRAMUSCULAR | Status: AC
  Administered 2017-05-24: 2 mg
  Filled 2017-05-23: qty 2

## 2017-05-23 MED ORDER — TRAVASOL 10 % IV SOLN
INTRAVENOUS | Status: AC
  Administered 2017-05-23: 19:00:00 via INTRAVENOUS
  Filled 2017-05-23: qty 1152

## 2017-05-23 NOTE — Plan of Care (Signed)
  Clinical Measurements: Will remain free from infection 05/23/2017 2051 - Progressing by Blair Promise, RN   Activity: Risk for activity intolerance will decrease 05/23/2017 2051 - Progressing by Blair Promise, RN   Coping: Level of anxiety will decrease 05/23/2017 2051 - Progressing by Blair Promise, RN

## 2017-05-23 NOTE — Consult Note (Signed)
[place] TEAM 1 - Stepdown/ICU TEAM CONSULT F/U NOTE  Marcus Pruitt OJJ:[id number] DOB: March 05, 1971 DOA: 05/14/2017 PCP: Laurey Morale, MD  Admit HPI / Brief Narrative: 46yo M with hx HTN, OSA, and Prostate cancer s/p resection who presented to the [facility] ER on 11/16 c/o sudden onset excruciating mid-abdominal pain. CT showed questionable SMA clot. Patient was started on heparin and transferred emergently to [facility] where he was brought to the OR and mesenteric angiogram showed it to actually be an SMA dissection, with multiple false lumens and a clot within it. Dr Bridgett Larsson reported the OR case was long and complicated with approx 2 L of blood loss. Went back to OR 11/22 for abdominal wound dehiscence and evacuation of a large hematoma.  TRH was asked by PCCM to assume the role of medical consultant as of 11/24.  PCCM was following the pt primarily for vent management while the pt was in the ICU.  The Vascular Surgery service is the attending of record.    HPI/Subjective: Medical issues stable today.  TRH will follow up tomorrow.    Recommendations/Plan:  SMA Dissection and SMA Clot s/p Surgical Repair - Repair of abdominal dehiscence and evacuation of intra-abdominal hematoma  Care per Vascular Surgery   Hemorrhagic Shock - acute blood loss anemia  Resolved s/p transfusion of 4U PRBC - follow trend   Acute Hypoxic Respiratory failure Postoperatively - primarily related to acute illness and complex nature of surgery - resolved w/ pt now on RA  OSA Cont CPAP per home regimen   HTN  BP variable - no change in tx today   Ileus Diet being slowly advanced as per surgical team - K+ and Mg at goal   Prostate cancer s/p robotic prostatectomy and pelvic lymph node dissection  Acute Encephalopathy  Due to acute critical illness - resolved   Code Status: FULL Family Communication:   Antibiotics: Cefepime 11/19 >  DVT prophylaxis: Heparin gtt   Objective: Blood pressure (!) 153/99, pulse  96, temperature 99.5 F (37.5 C), temperature source Oral, resp. rate 14, height 5\' 6"  (1.676 m), weight 99.5 kg (219 lb 5.7 oz), SpO2 97 %.  Intake/Output Summary (Last 24 hours) at 05/23/2017 1540 Last data filed at 05/23/2017 1200 Gross per 24 hour  Intake 2720 ml  Output 200 ml  Net 2520 ml    Exam: No exam today   Data Reviewed: Basic Metabolic Panel: Recent Labs  Lab 05/18/17 0332 05/19/17 0336 05/19/17 2100 05/20/17 0406 05/22/17 0442  NA 139 138 137 138 137  K 3.6 3.3* 3.5 3.7 4.4  CL 105 103 102 104 104  CO2 27 28 27 26 27   GLUCOSE 139* 139* 129* 128* 148*  BUN 6 9 10 12 14   CREATININE 0.75 0.85 0.85 0.85 0.83  CALCIUM 7.9* 7.9* 8.1* 8.3* 8.4*  MG 2.2 2.3  --  2.2 2.1  PHOS 1.4* 1.8*  --  2.0* 3.7    CBC: Recent Labs  Lab 05/19/17 0336 05/20/17 0406 05/21/17 0514 05/22/17 0442 05/23/17 0224  WBC 11.6* 10.6* 10.7* 13.6* 16.6*  HGB 7.3* 9.2* 8.2* 8.3* 8.4*  HCT 21.7* 26.9* 25.6* 24.8* 25.7*  MCV 82.8 82.5 89.5 83.5 85.4  PLT 215 228 182 230 308    Liver Function Tests: Recent Labs  Lab 05/19/17 0336 05/20/17 0406  AST 36 38  ALT 23 23  ALKPHOS 48 58  BILITOT 0.5 1.1  PROT 5.6* 6.0*  ALBUMIN 2.3* 2.6*   Recent Labs  Lab  05/18/17 0332  LIPASE 18  AMYLASE 65    CBG: Recent Labs  Lab 05/22/17 2053 05/23/17 0032 05/23/17 0343 05/23/17 0812 05/23/17 1129  GLUCAP 130* 118* 109* 132* 134*    Recent Results (from the past 240 hour(s))  MRSA PCR Screening     Status: None   Collection Time: 05/15/17  1:17 PM  Result Value Ref Range Status   MRSA by PCR NEGATIVE NEGATIVE Final    Comment:        The GeneXpert MRSA Assay (FDA approved for NASAL specimens only), is one component of a comprehensive MRSA colonization surveillance program. It is not intended to diagnose MRSA infection nor to guide or monitor treatment for MRSA infections.      Studies:   Recent x-ray studies have been reviewed in detail by the Attending  Physician  Scheduled Meds:  Scheduled Meds: . chlorhexidine  15 mL Mouth Rinse BID  . Chlorhexidine Gluconate Cloth  6 each Topical Daily  . HYDROmorphone   Intravenous Q4H  . insulin aspart  0-15 Units Subcutaneous Q4H  . mouth rinse  15 mL Mouth Rinse q12n4p  . metoprolol tartrate  10 mg Intravenous Q6H  . sodium chloride flush  10-40 mL Intracatheter Q12H   Edras Wilford T , MD   [facility] Office  [phone] Pager - Text Page per Shea Evans as per below:  On-Call/Text Page:      [email or web address]      password [email or web address]  If 7PM-7AM, please contact night-coverage [email or web address] Password [email or web address] 05/23/2017, 3:40 PM   LOS: 8 days

## 2017-05-23 NOTE — Progress Notes (Addendum)
  Progress Note    05/23/2017 8:17 AM 3 Days Post-Op  Subjective:  Asks if he can eat/drink b/c he is thirsty and wants apple juice.    Low grade fever 99.4-99.6 over nigith HR 80's-110's NSR/ST 427'C-623'J systolic 62% RA  Vitals:   05/23/17 0027 05/23/17 0344  BP: (!) 139/99 (!) 110/98  Pulse: (!) 112 (!) 125  Resp: (!) 23 (!) 21  Temp: 99.4 F (37.4 C) 99.6 F (37.6 C)  SpO2: 95% 97%    Physical Exam: Cardiac:  regular Lungs:  Non labored Incisions:  Dressing is clean and dry Abdomen:  Mildly distended; +flatus; +BS  CBC    Component Value Date/Time   WBC 16.6 (H) 05/23/2017 0224   RBC 3.01 (L) 05/23/2017 0224   HGB 8.4 (L) 05/23/2017 0224   HCT 25.7 (L) 05/23/2017 0224   PLT 308 05/23/2017 0224   MCV 85.4 05/23/2017 0224   MCH 27.9 05/23/2017 0224   MCHC 32.7 05/23/2017 0224   RDW 16.4 (H) 05/23/2017 0224   LYMPHSABS 1.3 03/11/2015 0904   MONOABS 0.4 03/11/2015 0904   EOSABS 0.3 03/11/2015 0904   BASOSABS 0.0 03/11/2015 0904    BMET    Component Value Date/Time   NA 137 05/22/2017 0442   K 4.4 05/22/2017 0442   CL 104 05/22/2017 0442   CO2 27 05/22/2017 0442   GLUCOSE 148 (H) 05/22/2017 0442   BUN 14 05/22/2017 0442   CREATININE 0.83 05/22/2017 0442   CALCIUM 8.4 (L) 05/22/2017 0442   GFRNONAA >60 05/22/2017 0442   GFRAA >60 05/22/2017 0442    INR    Component Value Date/Time   INR 1.24 05/15/2017 2353     Intake/Output Summary (Last 24 hours) at 05/23/2017 0817 Last data filed at 05/23/2017 0805 Gross per 24 hour  Intake 2119.65 ml  Output 375 ml  Net 1744.65 ml     Assessment:  46 y.o. male is s/p:  1. Right common femoral artery cannulation under ultrasound guidance 2. Placement of catheter in aorta 3. Aortogram 4. Thrombectomy of superior mesenteric artery 5. 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 Bovine patch angioplasty superior mesenteric artery  7. Selection of superior mesenteric artery   8. Superior mesenteric artery angiogram 7 Days Post-Op And 1.  Repair of abdominal wound dehiscence 2.  Evacuation of intra-abdominal hematoma 3 Days Post-Op   Plan: -pt doing well this am-passing flatus but no further BM; he does have BS and will advance him to clear liquids today-if he tolerates, will advance diet again tomorrow. -mobilize out of bed to chair and walking -DVT prophylaxis:  Heparin gtt per pharmacy-will need to transition to po anticoagulation prior to discharge.   -leukocytosis slightly elevated and low grade fever overnight-most likely atelectasis and peri-op-will encourage IS and out of bed. -check cbc tomorrow   Leontine Locket, PA-C [facility] [phone] 05/23/2017 8:17 AM  I have interviewed the patient and examined the patient. I agree with the findings by the PA. He has some hypoactive bowel sounds.  He has moved his bowels yesterday.  Passing flatus.  We will start clears slowly.  Increase activity.  Minimize pain medication.  Gae Gallop, MD [phone]

## 2017-05-23 NOTE — Progress Notes (Signed)
ANTICOAGULATION CONSULT NOTE - Follow Up Consult  Pharmacy Consult for heparin Indication: superior mesenteric vein thrombus  Labs: Recent Labs    05/20/17 0406  05/21/17 0514  05/21/17 2028 05/22/17 0338 05/22/17 0442 05/23/17 0224  HGB 9.2*  --  8.2*  --   --   --  8.3* 8.4*  HCT 26.9*  --  25.6*  --   --   --  24.8* 25.7*  PLT 228  --  182  --   --   --  230 308  HEPARINUNFRC 0.29*   < >  --    < > 0.32 0.30  --  0.19*  CREATININE 0.85  --   --   --   --   --  0.83  --    < > = values in this interval not displayed.    Assessment/Plan:  46yo male subtherapeutic on heparin though RN reports that pt had accidentally pulled IV line and heparin was off ~2hr. Will continue gtt at current rate and check additional level.   Wynona Neat, PharmD, BCPS  05/23/2017,4:00 AM

## 2017-05-23 NOTE — Progress Notes (Signed)
[place] CONSULT NOTE   Pharmacy Consult for TPN Indication: Prolonged Ileus  Patient Measurements: Height: 5\' 6"  (167.6 cm) Weight: 219 lb 5.7 oz (99.5 kg) IBW/kg (Calculated) : 63.8 TPN AdjBW (KG): 72.3 Body mass index is 35.41 kg/m.  Assessment:  46 yo M presents on 11/17 with abdominal pain. Also reported nausea and emesis. PMH of cancer, HTN, and low back pain. CT was ordered and found SMA embolism. 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 Has been NPO since admit, trialed TFs and unable to titrate due to abdominal distension and no bowel sounds. Pharmacy consulted to start TPN due to prolonged ileus.  GI: Prolonged ileus s/p OR on 11/18. Failed tube feed trials with abdominal distension and no bowel sounds. Now with flatus and BM. Albumin slightly low at 2.6. Prealbumin low at 6.5. Pepcid in TPN. Discontinuing NGT today, but keeping NPO with ice chips. Continues on TPN. Endo: CBGs controlled (120-140s) on SSI Insulin requirements in the past 24 hours: 8 units Lytes: Lytes wnl today. CoCa ok at 9.5.  Renal: SCr stable. BUN wnl. D5LR at 23ml/hr Pulm:2L [place]  Cards: BP wnl-elevated, ST in 90s. On metoprolol 5 IV q6h and heparin gtt. Heparin stopped due to coffee ground emesis from NG tube 11/22 AM but resumed that PM. Hepatobil: LFTs ok. Tbili wnl but up to 1.1. Trig elevated at 453 Neuro: Dilaudid PCA, Pain score 2. ID: On cefepime. Tmax 99.6, WBC up 16.6.  Best Practices: Hep gtt TPN Access: PICC triple lumen TPN start date: 11/20 >>  Nutritional Goals (per RD recommendation on 11/20): KCal: 1800-2000 Protein: 105-120 g Fluid: > 1.8 L  Current Nutrition:  NPO with ice chips  Plan:  Continue TPN to 80 ml/hr tonight Hold 20% lipid emulsion for first 7 days for ICU patients per ASPEN guidelines (Start date 11/27) This TPN provides 115 g of protein, 384 g  of dextrose, and 0 g of lipids for a total of 1,766 kcals, which meets ~100% of patient needs Electrolytes in TPN: no changes today- continue with prior increased phos (25 mmol/L providing 48 mmol/day) and increased potassium (60 mEq/L providing 115 mEq/day). Cl:Ac ratio 1:1. Add MVI, trace elements, and famotidine to TPN Continue moderate SSI and adjust as needed  Monitor TPN labs Follow-up advancement of diet and ability to wean TPN  Sloan Leiter, PharmD, BCPS, BCCCP Clinical Pharmacist Clinical phone 05/23/2017 until 3:30PM - #[phone] After hours, please call #[phone] 05/23/2017 7:24 AM

## 2017-05-23 NOTE — Plan of Care (Signed)
  Education: Knowledge of General Education information will improve 05/23/2017 0029 - Progressing by Blair Promise, RN   Health Behavior/Discharge Planning: Ability to manage health-related needs will improve 05/23/2017 0029 - Progressing by Blair Promise, RN   Clinical Measurements: Will remain free from infection 05/23/2017 0029 - Progressing by Blair Promise, RN   Safety: Ability to remain free from injury will improve 05/23/2017 0029 - Progressing by Blair Promise, RN

## 2017-05-23 NOTE — Progress Notes (Signed)
ANTICOAGULATION CONSULT NOTE - Follow Up Consult  Pharmacy Consult for IV Heparin Indication: superior mesenteric vein thrombus/VTE  No Known Allergies  Patient Measurements: Height: 5\' 6"  (167.6 cm) Weight: 219 lb 5.7 oz (99.5 kg) IBW/kg (Calculated) : 63.8 Heparin Dosing Weight: 87 kg  Vital Signs: Temp: 99.1 F (37.3 C) (11/25 0747) Temp Source: Oral (11/25 0747) BP: 144/92 (11/25 0747) Pulse Rate: 88 (11/25 0747)  Labs: Recent Labs    05/21/17 0514  05/22/17 0338 05/22/17 0442 05/23/17 0224 05/23/17 0940  HGB 8.2*  --   --  8.3* 8.4*  --   HCT 25.6*  --   --  24.8* 25.7*  --   PLT 182  --   --  230 308  --   HEPARINUNFRC  --    < > 0.30  --  0.19* 0.52  CREATININE  --   --   --  0.83  --   --    < > = values in this interval not displayed.    Estimated Creatinine Clearance: 122.8 mL/min (by C-G formula based on SCr of 0.83 mg/dL).  Medications: Heparin @ 2100 units/hr  Assessment: 40 yoM c/o abdominal pain found to have superior mesenteric artery thrombus. Heparin temporarily held due to bloody OG output but OR revealed old hematoma which was evacuated and heparin resumed.   Heparin level therapeutic at 0.52. CBC low but stable. No further bleeding reported.  Goal of Therapy:  Heparin level 0.3-0.7 units/ml Monitor platelets by anticoagulation protocol: Yes   Plan:  1) Continue heparin at 2100 units/hr 2) Daily heparin level and CBC  Nena Jordan, PharmD, BCPS 05/23/2017

## 2017-05-23 NOTE — Progress Notes (Signed)
Occupational Therapy Treatment Patient Details Name: Marcus Pruitt MRN: [id number] DOB: [birth year] Today's Date: 05/23/2017    History of present illness Pt is a 46 y.o. male who presented to the [facility] ER on 05/14/17 with c/o sudden onset excruciating mid-abdominal pain; CT showed questionable SMA clot. Pt started on heparin and transferred emergently to [facility] where he was brought to the OR and mesenteric angiogram showed it to actually be an SMA dissection, with multiple false lumens and with clot within it. Intubated 11/16-11/19. Pertinent PMH includes HTN, OSA, prostate cancer s/p resection, LBP, R ear hearing loss.   OT comments  Pt demonstrating good progress and has met initial OT goals. Updated goals in care plans section. Pt currently able to complete toilet transfers with supervision and RW, LB ADL with min guard assist, and standing grooming tasks with supervision. Initiated education concerning energy conservation and benefits of maximizing participation in ADL while admitted and pt verbalizes understanding. He would continue to benefit from OT services while admitted and OT will continue to follow.   Follow Up Recommendations  No OT follow up;Supervision/Assistance - 24 hour    Equipment Recommendations  3 in 1 bedside commode    Recommendations for Other Services      Precautions / Restrictions Precautions Precautions: None Restrictions Weight Bearing Restrictions: No       Mobility Bed Mobility Overal bed mobility: Needs Assistance Bed Mobility: Rolling;Sit to Sidelying Rolling: Supervision Sidelying to sit: Supervision       General bed mobility comments: Supervision for safety.   Transfers Overall transfer level: Needs assistance Equipment used: Rolling walker (2 wheeled) Transfers: Sit to/from Stand Sit to Stand: Supervision         General transfer comment: Supervision for safety.     Balance Overall balance assessment: Needs assistance Sitting-balance  support: No upper extremity supported;Feet supported Sitting balance-Leahy Scale: Good     Standing balance support: No upper extremity supported;Bilateral upper extremity supported;During functional activity Standing balance-Leahy Scale: Fair Standing balance comment: Able to statically stand without UE support.                            ADL either performed or assessed with clinical judgement   ADL Overall ADL's : Needs assistance/impaired Eating/Feeding: Set up;Sitting   Grooming: Supervision/safety;Standing   Upper Body Bathing: Min guard;Sitting           Lower Body Dressing: Min guard;Sit to/from stand   Toilet Transfer: Supervision/safety;Ambulation;RW           Functional mobility during ADLs: Rolling walker;Supervision/safety General ADL Comments: Pt reports completing his own bathing this morning and that this was very difficult but he was able to complete. Discussed energy conservation strategies.      Vision   Vision Assessment?: No apparent visual deficits   Perception     Praxis      Cognition Arousal/Alertness: Awake/alert Behavior During Therapy: WFL for tasks assessed/performed Overall Cognitive Status: Within Functional Limits for tasks assessed                                          Exercises     Shoulder Instructions       General Comments Pt's sister present and engaged in session.     Pertinent Vitals/ Pain       Pain Assessment: Faces Faces Pain  Scale: Hurts little more Pain Location: Abdomen Pain Descriptors / Indicators: Sore;Grimacing;Tightness(tightness at incision) Pain Intervention(s): Limited activity within patient's tolerance;Monitored during session;Repositioned  Home Living                                          Prior Functioning/Environment              Frequency  Min 2X/week        Progress Toward Goals  OT Goals(current goals can now be found in the  care plan section)  Progress towards OT goals: Goals met and updated - see care plan  Acute Rehab OT Goals Patient Stated Goal: Decreased pain OT Goal Formulation: With patient/family Time For Goal Achievement: 05/31/17 Potential to Achieve Goals: Good ADL Goals Pt Will Perform Lower Body Dressing: sit to/from stand;Independently Pt Will Transfer to Toilet: Independently;ambulating;regular height toilet Pt Will Perform Toileting - Clothing Manipulation and hygiene: Independently;sit to/from stand Pt Will Perform Tub/Shower Transfer: with modified independence;shower seat;Tub transfer  Plan Discharge plan remains appropriate    Co-evaluation                 AM-PAC PT "6 Clicks" Daily Activity     Outcome Measure   Help from another person eating meals?: A Little Help from another person taking care of personal grooming?: A Little Help from another person toileting, which includes using toliet, bedpan, or urinal?: A Little Help from another person bathing (including washing, rinsing, drying)?: A Little Help from another person to put on and taking off regular upper body clothing?: A Little Help from another person to put on and taking off regular lower body clothing?: A Little 6 Click Score: 18    End of Session Equipment Utilized During Treatment: Gait belt  OT Visit Diagnosis: Pain;Other abnormalities of gait and mobility (R26.89) Pain - Right/Left: (abdomen) Pain - part of body: (abdomen)   Activity Tolerance Patient tolerated treatment well   Patient Left with call bell/phone within reach;with family/visitor present;in bed(sitting at EOB to eat breakfast)   Nurse Communication Mobility status        Time: 0950-1010 OT Time Calculation (min): 20 min  Charges: OT General Charges $OT Visit: 1 Visit OT Treatments $Self Care/Home Management : 8-22 mins  Norman Herrlich, MS OTR/L  Pager: [place] A Ronit Marczak 05/23/2017, 11:55 AM

## 2017-05-24 ENCOUNTER — Encounter (HOSPITAL_COMMUNITY): Payer: Self-pay | Admitting: Vascular Surgery

## 2017-05-24 ENCOUNTER — Inpatient Hospital Stay (HOSPITAL_COMMUNITY): Payer: 59

## 2017-05-24 LAB — DIFFERENTIAL
BASOS PCT: 1 %
Basophils Absolute: 0.2 10*3/uL — ABNORMAL HIGH (ref 0.0–0.1)
EOS PCT: 2 %
Eosinophils Absolute: 0.3 10*3/uL (ref 0.0–0.7)
Lymphocytes Relative: 9 %
Lymphs Abs: 1.6 10*3/uL (ref 0.7–4.0)
MONO ABS: 1.2 10*3/uL — AB (ref 0.1–1.0)
MONOS PCT: 7 %
Neutro Abs: 14 10*3/uL — ABNORMAL HIGH (ref 1.7–7.7)
Neutrophils Relative %: 81 %

## 2017-05-24 LAB — COMPREHENSIVE METABOLIC PANEL
ALK PHOS: 232 U/L — AB (ref 38–126)
ALT: 138 U/L — AB (ref 17–63)
AST: 88 U/L — AB (ref 15–41)
Albumin: 2.7 g/dL — ABNORMAL LOW (ref 3.5–5.0)
Anion gap: 7 (ref 5–15)
BUN: 13 mg/dL (ref 6–20)
CALCIUM: 8.5 mg/dL — AB (ref 8.9–10.3)
CHLORIDE: 101 mmol/L (ref 101–111)
CO2: 27 mmol/L (ref 22–32)
CREATININE: 1.01 mg/dL (ref 0.61–1.24)
Glucose, Bld: 126 mg/dL — ABNORMAL HIGH (ref 65–99)
Potassium: 4.3 mmol/L (ref 3.5–5.1)
Sodium: 135 mmol/L (ref 135–145)
Total Bilirubin: 0.8 mg/dL (ref 0.3–1.2)
Total Protein: 6.6 g/dL (ref 6.5–8.1)

## 2017-05-24 LAB — GLUCOSE, CAPILLARY
GLUCOSE-CAPILLARY: 123 mg/dL — AB (ref 65–99)
GLUCOSE-CAPILLARY: 95 mg/dL (ref 65–99)
Glucose-Capillary: 123 mg/dL — ABNORMAL HIGH (ref 65–99)
Glucose-Capillary: 129 mg/dL — ABNORMAL HIGH (ref 65–99)
Glucose-Capillary: 135 mg/dL — ABNORMAL HIGH (ref 65–99)
Glucose-Capillary: 138 mg/dL — ABNORMAL HIGH (ref 65–99)
Glucose-Capillary: 142 mg/dL — ABNORMAL HIGH (ref 65–99)

## 2017-05-24 LAB — CBC
HCT: 24.7 % — ABNORMAL LOW (ref 39.0–52.0)
HEMOGLOBIN: 8.1 g/dL — AB (ref 13.0–17.0)
MCH: 27.5 pg (ref 26.0–34.0)
MCHC: 32.8 g/dL (ref 30.0–36.0)
MCV: 83.7 fL (ref 78.0–100.0)
Platelets: 327 10*3/uL (ref 150–400)
RBC: 2.95 MIL/uL — AB (ref 4.22–5.81)
RDW: 15.7 % — ABNORMAL HIGH (ref 11.5–15.5)
WBC: 17.3 10*3/uL — AB (ref 4.0–10.5)

## 2017-05-24 LAB — TRIGLYCERIDES: Triglycerides: 117 mg/dL (ref <150)

## 2017-05-24 LAB — HEPARIN LEVEL (UNFRACTIONATED)
HEPARIN UNFRACTIONATED: 0.78 [IU]/mL — AB (ref 0.30–0.70)
HEPARIN UNFRACTIONATED: 0.61 [IU]/mL (ref 0.30–0.70)
Heparin Unfractionated: 0.74 IU/mL — ABNORMAL HIGH (ref 0.30–0.70)

## 2017-05-24 LAB — PREALBUMIN: PREALBUMIN: 16.7 mg/dL — AB (ref 18–38)

## 2017-05-24 LAB — PHOSPHORUS: PHOSPHORUS: 3.9 mg/dL (ref 2.5–4.6)

## 2017-05-24 LAB — MAGNESIUM: MAGNESIUM: 2.1 mg/dL (ref 1.7–2.4)

## 2017-05-24 MED ORDER — INSULIN ASPART 100 UNIT/ML ~~LOC~~ SOLN
0.0000 [IU] | Freq: Three times a day (TID) | SUBCUTANEOUS | Status: DC
  Administered 2017-05-24: 2 [IU] via SUBCUTANEOUS

## 2017-05-24 MED ORDER — TRAVASOL 10 % IV SOLN
INTRAVENOUS | Status: DC
  Filled 2017-05-24: qty 1152

## 2017-05-24 MED ORDER — CIPROFLOXACIN IN D5W 400 MG/200ML IV SOLN
400.0000 mg | Freq: Two times a day (BID) | INTRAVENOUS | Status: DC
  Administered 2017-05-24 – 2017-05-26 (×5): 400 mg via INTRAVENOUS
  Filled 2017-05-24 (×6): qty 200

## 2017-05-24 MED ORDER — METRONIDAZOLE IN NACL 5-0.79 MG/ML-% IV SOLN
500.0000 mg | Freq: Four times a day (QID) | INTRAVENOUS | Status: DC
  Administered 2017-05-24 – 2017-05-26 (×9): 500 mg via INTRAVENOUS
  Filled 2017-05-24 (×9): qty 100

## 2017-05-24 MED FILL — Heparin Sodium (Porcine) Inj 5000 Unit/ML: INTRAMUSCULAR | Qty: 1 | Status: AC

## 2017-05-24 NOTE — Progress Notes (Signed)
ANTICOAGULATION CONSULT NOTE - Follow Up Consult  Pharmacy Consult for IV Heparin Indication: superior mesenteric vein thrombus/VTE  No Known Allergies  Patient Measurements: Height: 5\' 6"  (167.6 cm) Weight: 217 lb 9.6 oz (98.7 kg) IBW/kg (Calculated) : 63.8 Heparin Dosing Weight: 87 kg  Vital Signs: Temp: 99 F (37.2 C) (11/26 2031) Temp Source: Oral (11/26 2031) BP: 139/82 (11/26 2031) Pulse Rate: 87 (11/26 2031)  Labs: Recent Labs    05/22/17 0442 05/23/17 0224  05/24/17 0519 05/24/17 1350 05/24/17 2033  HGB 8.3* 8.4*  --  8.1*  --   --   HCT 24.8* 25.7*  --  24.7*  --   --   PLT 230 308  --  327  --   --   HEPARINUNFRC  --  0.19*   < > 0.78* 0.74* 0.61  CREATININE 0.83  --   --  1.01  --   --    < > = values in this interval not displayed.    Estimated Creatinine Clearance: 100.6 mL/min (by C-G formula based on SCr of 1.01 mg/dL).  Medications: Heparin @ 2000 units/hr  Assessment: 14 yoM c/o abdominal pain found to have superior mesenteric artery thrombus. Heparin temporarily held due to bloody OG output but OR revealed old hematoma which was evacuated and heparin resumed.   Heparin level = 0.61 therapeutic after rate decrease earlier today.  Goal of Therapy:  Heparin level 0.3-0.7 units/ml Monitor platelets by anticoagulation protocol: Yes   Plan:  Continue heparin 1850 units/hr Daily heparin level and CBC  Maryanna Shape, PharmD, BCPS  Clinical Pharmacist  Pager: [phone]   05/24/2017 9:57 PM

## 2017-05-24 NOTE — Progress Notes (Addendum)
[place] NOTE   Pharmacy Consult for TPN Indication: Prolonged Ileus  Patient Measurements: Height: '5\' 6"'  (167.6 cm) Weight: 217 lb 9.6 oz (98.7 kg) IBW/kg (Calculated) : 63.8 TPN AdjBW (KG): 72.3 Body mass index is 35.12 kg/m.  Assessment:  46 yo M presents on 11/17 with abdominal pain. Also reported nausea and emesis. PMH of cancer, HTN, and low back pain. CT was ordered and found SMA embolism. 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 Has been NPO since admit, trialed TFs and unable to titrate due to abdominal distension and no bowel sounds. Pharmacy consulted to start TPN due to prolonged ileus.  GI: Prolonged ileus s/p OR on 11/18. Failed tube feed trials with abdominal distension and no bowel sounds. Now with +flatus and LBM 11/25. Albumin slightly low at 2.7. Prealbumin up to 16.7. Pepcid in TPN. Feels thirsty, asking if can eat/drink. D/c NGT 11/25, start clear liquids. Abdomen distended 11/26 - stat U/S RUQ - no gallbladder findings, NPO for now Endo: CBGs controlled (100-120s) on SSI Insulin requirements in the past 24 hours: 10 units Lytes: Lytes remain wnl today. CoCa ok at 9.3.  Renal: SCr stable. BUN wnl Pulm: [place]  Cards: BP wnl-elevated, HR ok. On metoprolol 5 IV q6h and heparin gtt. Heparin stopped due to coffee ground emesis from NGT 11/22 but resumed that PM. Hepatobil: LFTs and alk phos bumped - AST 88 / ALT 138 / Alk Phos 232. Tbili WNL. Trig decreased 453>117 Neuro: Dilaudid PCA, Pain score 3. ID: Cefepime d/c'd > Cipro/Flagyl added 11/26. Afebrile, WBC up 17.3  Best Practices: Hep gtt TPN Access: PICC triple lumen TPN start date: 11/20 >>  Nutritional Goals (per RD recommendation on 11/20): KCal: 1800-2000 Protein: 105-120 g Fluid: > 1.8 L  Current Nutrition:  NPO TPN  Plan:  Continue TPN at 80 ml/hr. Add lipids to TPN today as  patient is transferred out of ICU This TPN provides 115 g of protein, 250 g of dextrose, and 63 g of lipids for a total of 1943 kcals, which meets 100% of patient needs Electrolytes in TPN: no changes today- continue with prior increased phos (25 mmol/L) and increased potassium (60 mEq/L). Cl:Ac ratio 1:1 Add MVI, trace elements, and famotidine to TPN Continue moderate SSI and adjust as needed  Monitor TPN labs Follow-up Stat U/S RUQ, Surgery plan for TPN D/c cefepime with Cipro/Flagyl added?   Elicia Lamp, PharmD, BCPS Clinical Pharmacist Rx Phone # for today: [phone] After 3:30PM, please call Main Rx: [phone] 05/24/2017 8:17 AM    ADDENDUM: Dr. Bridgett Larsson requests to finish TPN bag for today and not to order another bag tonight. Unclear if TPN consult to be discontinued - have paged Vascular Surgery to clarify plan.  Elicia Lamp, PharmD, BCPS Clinical Pharmacist 05/24/2017 1:42 PM

## 2017-05-24 NOTE — Progress Notes (Signed)
ANTICOAGULATION CONSULT NOTE - Follow Up Consult  Pharmacy Consult for IV Heparin Indication: superior mesenteric vein thrombus/VTE  No Known Allergies  Patient Measurements: Height: 5\' 6"  (167.6 cm) Weight: 217 lb 9.6 oz (98.7 kg) IBW/kg (Calculated) : 63.8 Heparin Dosing Weight: 87 kg  Vital Signs: Temp: 99.2 F (37.3 C) (11/26 1416) Temp Source: Oral (11/26 1416) BP: 141/94 (11/26 1416) Pulse Rate: 84 (11/26 1416)  Labs: Recent Labs    05/22/17 0442 05/23/17 0224 05/23/17 0940 05/24/17 0519 05/24/17 1350  HGB 8.3* 8.4*  --  8.1*  --   HCT 24.8* 25.7*  --  24.7*  --   PLT 230 308  --  327  --   HEPARINUNFRC  --  0.19* 0.52 0.78* 0.74*  CREATININE 0.83  --   --  1.01  --     Estimated Creatinine Clearance: 100.6 mL/min (by C-G formula based on SCr of 1.01 mg/dL).  Medications: Heparin @ 2000 units/hr  Assessment: 55 yoM c/o abdominal pain found to have superior mesenteric artery thrombus. Heparin temporarily held due to bloody OG output but OR revealed old hematoma which was evacuated and heparin resumed.   Heparin level was therapeutic, but increased overnight- most recently elevated at 0.74 units/mL on 2000 units/hr. Confirmed with patient this was drawn from opposite arm, peripheral stick (not off line).  Goal of Therapy:  Heparin level 0.3-0.7 units/ml Monitor platelets by anticoagulation protocol: Yes   Plan:  Decrease heparin to 1850 units/hr Heparin level in 6 hours Daily heparin level and CBC  Catie Chiao D. Isaly Fasching, PharmD, [place] Clinical Pharmacist Clinical Phone for 05/24/2017 until 3:30pm: [phone] If after 3:30pm, please call main pharmacy at [phone] 05/24/2017 2:40 PM

## 2017-05-24 NOTE — Progress Notes (Addendum)
Progress Note    05/24/2017 7:25 AM 4 Days Post-Op  Subjective:  "I'm not doing so good this morning-the bed is soaked"  Tm 100.1 now 99.5 HR 80's-100's NSR 202'R-427'C systolic 62% RA  Vitals:   05/24/17 0409 05/24/17 0410  BP: (!) 144/93 (!) 144/93  Pulse: 94 93  Resp: 18 (!) 23  Temp: 99.3 F (37.4 C) 99.5 F (37.5 C)  SpO2: 93% 98%    Physical Exam: General:  Mild distress; diaphoretic Cardiac:  regular Lungs:  Decreased BS at bases Incisions:  Clean and dry with staples in tact. Extremities:  Bilateral feet are warm Abdomen:  Distended; decreased BS throughout; -flatus; +nausea overnight; -BM  CBC    Component Value Date/Time   WBC 17.3 (H) 05/24/2017 0519   RBC 2.95 (L) 05/24/2017 0519   HGB 8.1 (L) 05/24/2017 0519   HCT 24.7 (L) 05/24/2017 0519   PLT 327 05/24/2017 0519   MCV 83.7 05/24/2017 0519   MCH 27.5 05/24/2017 0519   MCHC 32.8 05/24/2017 0519   RDW 15.7 (H) 05/24/2017 0519   LYMPHSABS 1.6 05/24/2017 0519   MONOABS 1.2 (H) 05/24/2017 0519   EOSABS 0.3 05/24/2017 0519   BASOSABS 0.2 (H) 05/24/2017 0519   CMP Latest Ref Rng & Units 05/24/2017 05/22/2017 05/20/2017  Glucose 65 - 99 mg/dL 126(H) 148(H) 128(H)  BUN 6 - 20 mg/dL '13 14 12  ' Creatinine 0.61 - 1.24 mg/dL 1.01 0.83 0.85  Sodium 135 - 145 mmol/L 135 137 138  Potassium 3.5 - 5.1 mmol/L 4.3 4.4 3.7  Chloride 101 - 111 mmol/L 101 104 104  CO2 22 - 32 mmol/L '27 27 26  ' Calcium 8.9 - 10.3 mg/dL 8.5(L) 8.4(L) 8.3(L)  Total Protein 6.5 - 8.1 g/dL 6.6 - 6.0(L)  Total Bilirubin 0.3 - 1.2 mg/dL 0.8 - 1.1  Alkaline Phos 38 - 126 U/L 232(H) - 58  AST 15 - 41 U/L 88(H) - 38  ALT 17 - 63 U/L 138(H) - 23    INR    Component Value Date/Time   INR 1.24 05/15/2017 2353     Intake/Output Summary (Last 24 hours) at 05/24/2017 0725 Last data filed at 05/24/2017 0600 Gross per 24 hour  Intake 3206.67 ml  Output -  Net 3206.67 ml     Assessment:  46 y.o. male is s/p:  1. Rightcommon  femoral arterycannulation under ultrasound guidance 2. Placement of catheter in aorta 3. Aortogram 4. Thrombectomy ofsuperior mesenteric artery 5. Openresection ofsuperior mesenteric arterydissectionflap 6. Bovine patch angioplastysuperior mesenteric artery 7. Selection ofsuperior mesenteric artery 8. Superior mesenteric arteryangiogram 8 Days Post-Op And 1.Repair of abdominal wound dehiscence 2.Evacuation of intra-abdominal hematoma  4 Days Post-Op  Plan: -pt with low grade fever and leukocytosis this am.  Will get flat and upright xray.  Will start abx empirically.  Invanz per Dr. Bridgett Larsson -elevated alk phos and liver enzymes-will get stat u/s RUQ;  If this is negative, will need to stop TPN.  Npo for now until after u/s -distended abdomen with decreased BS and minimal flatus-may need to replace NGT or enema. -DVT prophylaxis:  Heparin gtt   Leontine Locket, PA-C [facility] [phone] 05/24/2017 7:25 AM   Addendum  I have independently interviewed and examined the patient, and I agree with the physician assistant's findings.  Pt feeling better after passing gas in bathroom.  Still quite distended.  Non-acute abdomen.  - Check RUQ [place] to r/o cholecystitis - Flat/Upright Abd Xray to evaluate bowel gas pattern -  LFT and Alk phos elevation may be due to TPN also - Climbing WBC concerning, will start some empiric abx for GI source (cipro/flagyl). - Pt is at high risk for intra-abdominal abscess from intra-abdominal hematoma and superinfection of such - if WBC and fever curve continue to climb, will get CT abd/pelvis - May need suppository vs enema for additional distal stimulation  Adele Barthel, MD, FACS [facility] [place]: [phone] Pager: [phone]  05/24/2017, 8:15 AM  Addendum  Radiology     Dg Abd 2 Views  Result Date: 05/24/2017 CLINICAL DATA:  Postop.  Distention. EXAM: ABDOMEN - 2 VIEW  COMPARISON:  05/18/2017 FINDINGS: Small bowel dilatation noted in the mid calf. Scattered air-fluid levels on decubitus view. No free air. No organomegaly. IMPRESSION: Dilated central small bowel loops with air-fluid levels. Findings concerning for small bowel obstruction. Electronically Signed   By: Rolm Baptise M.D.   On: 05/24/2017 09:10   US Abdomen Limited Ruq  Result Date: 05/24/2017 CLINICAL DATA:  Elevated alkaline phosphatase. EXAM: ULTRASOUND ABDOMEN LIMITED RIGHT UPPER QUADRANT COMPARISON:  CT abdomen and pelvis 05/14/2017 FINDINGS: Gallbladder: No gallstones or wall thickening visualized. No sonographic Murphy sign noted by sonographer. Pericholecystic fluid is present. Common bile duct: Diameter: 3 mm Liver: No focal lesion identified. Within normal limits in parenchymal echogenicity. Mildly liver contour. Small volume perihepatic free fluid. Portal vein is patent on color Doppler imaging with normal direction of blood flow towards the liver. Incidental note of an approximately 2 cm right renal cyst. IMPRESSION: 1. Small volume perihepatic free fluid. 2. Mildly nodular liver contour which could reflect cirrhosis. No focal liver abnormality identified. 3. Normal gallbladder.  No biliary dilatation. Electronically Signed   By: Logan Bores M.D.   On: 05/24/2017 09:23   - No gallbladder findings on [place], so LFT and Alkphos might be related to TPN - Finish TPN bag.  Don't order another bag. - I reviewed the Abd films and I don't think they are c/w SBO.  His presentation also isn't c/w such.  Probably some residual ileus. - d/c cefepime given chg to Cipro/Flagyl  Adele Barthel, MD, FACS [facility] [place] Office: [phone] Pager: [phone]  05/24/2017, 9:57 AM

## 2017-05-24 NOTE — Consult Note (Signed)
  [place] TEAM 1 - Stepdown/ICU TEAM  Chart reviewed.    All active issues are presently being managed by Vascular Surgery, the attending of record.    [facility] will sign off at this time.  Please call should our further assistance in the care of this nice gentlemen be desired.  Cherene Altes, MD [facility] Office  [phone] Pager - Text Page per Amion as per below:  On-Call/Text Page:      [email or web address]      password [email or web address]  If 7PM-7AM, please contact night-coverage [email or web address] Password [email or web address] 05/24/2017, 2:53 PM

## 2017-05-24 NOTE — Progress Notes (Signed)
ANTICOAGULATION CONSULT NOTE - Follow Up Consult  Pharmacy Consult for heparin Indication: superior mesenteric vein thrombus  Labs: Recent Labs    05/22/17 0442 05/23/17 0224 05/23/17 0940 05/24/17 0519  HGB 8.3* 8.4*  --  8.1*  HCT 24.8* 25.7*  --  24.7*  PLT 230 308  --  327  HEPARINUNFRC  --  0.19* 0.52 0.78*  CREATININE 0.83  --   --  1.01    Assessment: 46yo male now above goal on heparin after one level at goal.  Goal of Therapy:  Heparin level 0.3-0.7 units/ml   Plan:  Will decrease heparin gtt slightly to 2000 units/hr and check level in [place], PharmD, BCPS  05/24/2017,6:32 AM

## 2017-05-25 LAB — GLUCOSE, CAPILLARY
GLUCOSE-CAPILLARY: 95 mg/dL (ref 65–99)
Glucose-Capillary: 112 mg/dL — ABNORMAL HIGH (ref 65–99)
Glucose-Capillary: 99 mg/dL (ref 65–99)
Glucose-Capillary: 101 mg/dL — ABNORMAL HIGH (ref 65–99)

## 2017-05-25 LAB — COMPREHENSIVE METABOLIC PANEL
ALK PHOS: 227 U/L — AB (ref 38–126)
ALT: 149 U/L — ABNORMAL HIGH (ref 17–63)
ANION GAP: 8 (ref 5–15)
AST: 81 U/L — ABNORMAL HIGH (ref 15–41)
Albumin: 2.7 g/dL — ABNORMAL LOW (ref 3.5–5.0)
BUN: 14 mg/dL (ref 6–20)
CALCIUM: 8.3 mg/dL — AB (ref 8.9–10.3)
CO2: 25 mmol/L (ref 22–32)
CREATININE: 1.1 mg/dL (ref 0.61–1.24)
Chloride: 101 mmol/L (ref 101–111)
Glucose, Bld: 105 mg/dL — ABNORMAL HIGH (ref 65–99)
Potassium: 4.8 mmol/L (ref 3.5–5.1)
SODIUM: 134 mmol/L — AB (ref 135–145)
TOTAL PROTEIN: 6.7 g/dL (ref 6.5–8.1)
Total Bilirubin: 0.8 mg/dL (ref 0.3–1.2)

## 2017-05-25 LAB — CBC
HCT: 24.3 % — ABNORMAL LOW (ref 39.0–52.0)
HEMOGLOBIN: 7.9 g/dL — AB (ref 13.0–17.0)
MCH: 27.5 pg (ref 26.0–34.0)
MCHC: 32.5 g/dL (ref 30.0–36.0)
MCV: 84.7 fL (ref 78.0–100.0)
PLATELETS: 347 10*3/uL (ref 150–400)
RBC: 2.87 MIL/uL — AB (ref 4.22–5.81)
RDW: 15.8 % — ABNORMAL HIGH (ref 11.5–15.5)
WBC: 16.9 10*3/uL — AB (ref 4.0–10.5)

## 2017-05-25 LAB — PHOSPHORUS: PHOSPHORUS: 3.6 mg/dL (ref 2.5–4.6)

## 2017-05-25 LAB — HEPARIN LEVEL (UNFRACTIONATED): Heparin Unfractionated: 0.61 IU/mL (ref 0.30–0.70)

## 2017-05-25 LAB — MAGNESIUM: MAGNESIUM: 2.1 mg/dL (ref 1.7–2.4)

## 2017-05-25 MED ORDER — FLEET ENEMA 7-19 GM/118ML RE ENEM
1.0000 | ENEMA | Freq: Once | RECTAL | Status: DC
  Filled 2017-05-25: qty 1

## 2017-05-25 MED ORDER — FAMOTIDINE 20 MG PO TABS
20.0000 mg | ORAL_TABLET | Freq: Two times a day (BID) | ORAL | Status: DC
  Administered 2017-05-25 – 2017-05-28 (×7): 20 mg via ORAL
  Filled 2017-05-25 (×7): qty 1

## 2017-05-25 MED ORDER — ENOXAPARIN SODIUM 100 MG/ML ~~LOC~~ SOLN
100.0000 mg | Freq: Two times a day (BID) | SUBCUTANEOUS | Status: DC
  Administered 2017-05-25 (×2): 100 mg via SUBCUTANEOUS
  Filled 2017-05-25 (×2): qty 1

## 2017-05-25 MED ORDER — ENSURE ENLIVE PO LIQD
237.0000 mL | Freq: Two times a day (BID) | ORAL | Status: DC
  Administered 2017-05-25: 237 mL via ORAL

## 2017-05-25 NOTE — Progress Notes (Signed)
Patient refused CPAP at this time, no distress noted. 

## 2017-05-25 NOTE — Progress Notes (Addendum)
ADDENDUM To transition from IV heparin to subQ treatment Lovenox.  Plan: Stop heparin now. In 1 hour, start Lovenox 1mg /kg (100mg ) subQ q12h CBC in the AM   Aariyana Manz D. Lizet Kelso, PharmD, BCPS Clinical Pharmacist 05/25/2017 10:25 AM    ANTICOAGULATION CONSULT NOTE - Follow Up [place] for IV Heparin Indication: superior mesenteric vein thrombus/VTE  No Known Allergies  Patient Measurements: Height: 5\' 6"  (167.6 cm) Weight: 214 lb 11.2 oz (97.4 kg) IBW/kg (Calculated) : 63.8 Heparin Dosing Weight: 87 kg  Vital Signs: Temp: 98.6 F (37 C) (11/27 0321) Temp Source: Oral (11/27 0321) BP: 128/85 (11/27 0321)  Labs: Recent Labs    05/23/17 0224  05/24/17 0519 05/24/17 1350 05/24/17 2033 05/25/17 0224  HGB 8.4*  --  8.1*  --   --  7.9*  HCT 25.7*  --  24.7*  --   --  24.3*  PLT 308  --  327  --   --  347  HEPARINUNFRC 0.19*   < > 0.78* 0.74* 0.61 0.61  CREATININE  --   --  1.01  --   --  1.10   < > = values in this interval not displayed.    Estimated Creatinine Clearance: 91.6 mL/min (by C-G formula based on SCr of 1.1 mg/dL).  Medications: Heparin @ 2000 units/hr  Assessment: 27 yoM c/o abdominal pain found to have superior mesenteric artery thrombus. Heparin temporarily held due to bloody OG output but OR revealed old hematoma which was evacuated and heparin resumed.   Heparin level now therapeutic x2 on 1850 untits/hr after a rate decrease yesterday morning. Hgb with slight drop to 7.9, plts stable at 347- no bleeding noted.  Goal of Therapy:  Heparin level 0.3-0.7 units/ml Monitor platelets by anticoagulation protocol: Yes   Plan:  Continue heparin at 1850 units/hr Daily heparin level and CBC Follow for ability to transition to oral anticoagulant  Kaitlan Bin D. Ziggy Chanthavong, PharmD, [place] Clinical Pharmacist Clinical Phone for 05/25/2017 until 3:30pm: [phone] If after 3:30pm, please call main pharmacy at [phone] 05/25/2017 8:50 AM

## 2017-05-25 NOTE — Progress Notes (Signed)
Per insurance check for Xarelto  # 4. S/W  TAWNY  @ [facility] # [phone]    1. XARELTO  20 MG BID   COVER- YES  CO-PAY- $ 75.00  TIER- 3 DRUG  PRIOR APPROVAL- NO   PATIENT PAY A % OF 30 % OR A MAX  DEDUCTIBLE: NO   PREFERRED PHARMACY : [facility]

## 2017-05-25 NOTE — Plan of Care (Signed)
  Health Behavior/Discharge Planning: Ability to manage health-related needs will improve 05/25/2017 0355 - Progressing by Peggye Pitt, RN   Coping: Level of anxiety will decrease 05/25/2017 0355 - Progressing by Peggye Pitt, RN   Elimination: Will not experience complications related to bowel motility 05/25/2017 0355 - Progressing by Peggye Pitt, RN   Safety: Ability to remain free from injury will improve 05/25/2017 0355 - Progressing by Peggye Pitt, RN   Completed/Met Education: Knowledge of General Education information will improve 05/25/2017 0355 - Completed/Met by Peggye Pitt, RN Nutrition: Adequate nutrition will be maintained 05/25/2017 0355 - Completed/Met by Peggye Pitt, RN Elimination: Will not experience complications related to urinary retention 05/25/2017 0355 - Completed/Met by Peggye Pitt, RN   Safety: Ability to remain free from injury will improve 05/25/2017 0355 - Progressing by Peggye Pitt, RN   Education: Knowledge of General Education information will improve 05/25/2017 0355 - Completed/Met by Peggye Pitt, RN   Nutrition: Adequate nutrition will be maintained 05/25/2017 0355 - Completed/Met by Peggye Pitt, RN

## 2017-05-25 NOTE — Progress Notes (Signed)
Physical Therapy Treatment Patient Details Name: Marcus Pruitt MRN: [id number] DOB: 02-23-1971 Today's Date: 05/25/2017    History of Present Illness Pt is a 46 y.o. male who presented to the [facility] ER on 05/14/17 with c/o sudden onset excruciating mid-abdominal pain; CT showed questionable SMA clot. 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 Intubated 11/16-11/19. Pertinent PMH includes HTN, OSA, prostate cancer s/p resection, LBP, R ear hearing loss.    PT Comments    Pt in recliner upon arrival. Not motivated to ambulate due to discomfort in abdomen but willing to try. Supervision for safety. Performed proper sit to stand with good foot and hand placement. Pt supervision for gait with RW progressing to gait without RW. Able to walk increased distance without rest break. Reports discomfort decreased at end of session. Pt continues to progress well. Pt will continue to benefit from PT to help increase their strength/endurance  and independence with mobility.  Follow Up Recommendations  No PT follow up;Supervision - Intermittent     Equipment Recommendations       Recommendations for Other Services       Precautions / Restrictions Precautions Precautions: None Restrictions Weight Bearing Restrictions: No    Mobility  Bed Mobility               General bed mobility comments: Pt in recliner upon arrival  Transfers Overall transfer level: Needs assistance Equipment used: Rolling walker (2 wheeled) Transfers: Sit to/from Stand Sit to Stand: Supervision         General transfer comment: Supervision for safety.   Ambulation/Gait   Ambulation Distance (Feet): 734 Feet(471ft with RW 400 without RW) Assistive device: None;Rolling walker (2 wheeled) Gait Pattern/deviations: Step-through pattern;Decreased stride  length Gait velocity: Decreased   General Gait Details: Slow steady gait. Supervision for lines/safety. Pt able to ambulate with RW progressed to ambulating without RW. No LOB. Pt reported discomfort and pressure in abdomen decreasing throughout session   Stairs            Wheelchair Mobility    Modified Rankin (Stroke Patients Only)       Balance Overall balance assessment: Needs assistance Sitting-balance support: No upper extremity supported;Feet supported Sitting balance-Leahy Scale: Good Sitting balance - Comments: Pt able to perfrom seated exercises with no UE support     Standing balance-Leahy Scale: Fair Standing balance comment: Able to statically stand without UE support while SPTA adjusted walker                            Cognition Arousal/Alertness: Awake/alert Behavior During Therapy: WFL for tasks assessed/performed Overall Cognitive Status: Within Functional Limits for tasks assessed                                        Exercises General Exercises - Lower Extremity Gluteal Sets: AROM;15 reps;Both Heel Raises: AROM;Both;20 reps Other Exercises Other Exercises: towel squeeze 10 reps 5 sec holds    General Comments        Pertinent Vitals/Pain Pain Assessment: 0-10 Pain Score: 4  Pain Location: Abdomen Pain Descriptors / Indicators: Pressure;Discomfort;Tightness Pain Intervention(s): Monitored during session;Repositioned    Home Living  Prior Function            PT Goals (current goals can now be found in the care plan section) Acute Rehab PT Goals Patient Stated Goal: to get better Potential to Achieve Goals: Good Progress towards PT goals: Progressing toward goals    Frequency    Min 3X/week      PT Plan Current plan remains appropriate    Co-evaluation              AM-PAC PT "6 Clicks" Daily Activity  Outcome Measure  Difficulty turning over in bed  (including adjusting bedclothes, sheets and blankets)?: None Difficulty moving from lying on back to sitting on the side of the bed? : Unable Difficulty sitting down on and standing up from a chair with arms (e.g., wheelchair, bedside commode, etc,.)?: A Little Help needed moving to and from a bed to chair (including a wheelchair)?: A Little Help needed walking in hospital room?: A Little Help needed climbing 3-5 steps with a railing? : A Little 6 Click Score: 17    End of Session Equipment Utilized During Treatment: Gait belt Activity Tolerance: Patient tolerated treatment well Patient left: in chair;with call bell/phone within reach Nurse Communication: Mobility status PT Visit Diagnosis: Other abnormalities of gait and mobility (R26.89);Pain     Time: 7680-8811 PT Time Calculation (min) (ACUTE ONLY): 28 min  Charges:  $Gait Training: 23-37 mins                    G Codes:  Functional Assessment Tool Used: AM-PAC 6 Clicks Basic Mobility    [place], [place] }   Fransisca Connors 05/25/2017, 5:22 PM

## 2017-05-25 NOTE — Progress Notes (Signed)
Nutrition Follow Up  DOCUMENTATION CODES:   Obesity unspecified  INTERVENTION:    Ensure Enlive po BID, each supplement provides 350 kcal and 20 grams of protein  NUTRITION DIAGNOSIS:   Inadequate oral intake related to altered GI function, decreased appetite as evidenced by meal completion < 25%, ongoing  GOAL:   Patient will meet greater than or equal to 90% of their needs, progressing  MONITOR:   Diet advancement, PO intake, Supplement acceptance, Labs, Weight trends, Skin  ASSESSMENT:   Pt with PMH significant for HTN, and prostate cancer s/p resection. Presents this admission with with complaints of extreme abdominal pain. CT showed questionable SMA clot, mesenteric angiogram confirmed SMA dissection. Pt was intubated prior to surgery.   11/17- thrombectomy of superior mesenteric artery with patch placement 11/19- extubated, TF d/c 11/20- TPN started  TPN discontinued 11/26. NGT out. Advanced to Clear Liquids 11/26. Now on Full Liquids. Pt reports his appetite is slowly improving. Had some coffee and pudding this AM.  He is amenable to trying Ensure Enlive nutrition supplements. Labs and medications reviewed. Na 134 (L). CBG's [id number].  Diet Order:  Diet full liquid Room service appropriate? Yes; Fluid consistency: Thin  EDUCATION NEEDS:   Not appropriate for education at this time  Skin:  Skin Assessment: Reviewed RN Assessment  Last BM:  11/26  Height:   Ht Readings from Last 1 Encounters:  05/14/17 5\' 6"  (1.676 m)    Weight:   Wt Readings from Last 1 Encounters:  05/25/17 214 lb 11.2 oz (97.4 kg)    Ideal Body Weight:  64.5 kg  BMI:  Body mass index is 34.65 kg/m.  Estimated Nutritional Needs:   Kcal:  1800-2000 kcal/day  Protein:  105-120 grams  Fluid:  >1.8 L/day  Arthur Holms, RD, LDN Pager #: [phone] After-Hours Pager #: [phone]

## 2017-05-25 NOTE — Progress Notes (Addendum)
Progress Note    05/25/2017 7:44 AM 5 Days Post-Op  Subjective:  Says he feels a little better but still bloated; had small BM and flatus last evening  Tm 99.2 now afebrile HR 80's NSR 884'Z-660'Y systolic 301% RA  Vitals:   05/25/17 0321 05/25/17 0548  BP: 128/85   Pulse:    Resp: 20 (!) 22  Temp: 98.6 F (37 C)   SpO2: 100%     Physical Exam: Cardiac:  regular Lungs:  Non labored Incisions:  Laparotomy incision is clean and dry with staples in tact; no drainage. Extremities:  Bilaterally feet warm  Abdomen:  Distended; hypoactive bowel sounds  CBC    Component Value Date/Time   WBC 16.9 (H) 05/25/2017 0224   RBC 2.87 (L) 05/25/2017 0224   HGB 7.9 (L) 05/25/2017 0224   HCT 24.3 (L) 05/25/2017 0224   PLT 347 05/25/2017 0224   MCV 84.7 05/25/2017 0224   MCH 27.5 05/25/2017 0224   MCHC 32.5 05/25/2017 0224   RDW 15.8 (H) 05/25/2017 0224   LYMPHSABS 1.6 05/24/2017 0519   MONOABS 1.2 (H) 05/24/2017 0519   EOSABS 0.3 05/24/2017 0519   BASOSABS 0.2 (H) 05/24/2017 0519    BMET    Component Value Date/Time   NA 134 (L) 05/25/2017 0224   K 4.8 05/25/2017 0224   CL 101 05/25/2017 0224   CO2 25 05/25/2017 0224   GLUCOSE 105 (H) 05/25/2017 0224   BUN 14 05/25/2017 0224   CREATININE 1.10 05/25/2017 0224   CALCIUM 8.3 (L) 05/25/2017 0224   GFRNONAA >60 05/25/2017 0224   GFRAA >60 05/25/2017 0224    INR    Component Value Date/Time   INR 1.24 05/15/2017 2353     Intake/Output Summary (Last 24 hours) at 05/25/2017 0744 Last data filed at 05/25/2017 0600 Gross per 24 hour  Intake 1140 ml  Output 1400 ml  Net -260 ml     Assessment:  46 y.o. male is s/p:  1. Rightcommon femoral arterycannulation under ultrasound guidance 2. Placement of catheter in aorta 3. Aortogram 4. Thrombectomy ofsuperior mesenteric artery 5. Openresection ofsuperior mesenteric arterydissectionflap 6. Bovine patch angioplastysuperior mesenteric artery 7. Selection  ofsuperior mesenteric artery 8. Superior mesenteric arteryangiogram 9 Days Post-Op And 1.Repair of abdominal wound dehiscence 2.Evacuation of intra-abdominal hematoma   5 Days Post-Op   Plan: -pt feeling a bit better this morning.  He is passing flatus and had a small BM this morning.  Will plan for enema this morning.  He is on the PCA-will transition to po today or tomorrow depending on his bowel function.   ALT increased today, however, the AST and alk phos decreased slightly.   TPN is off.  GB u/s normal and no focal liver abnormality.   -slight improvement in leukocytosis with one episode of low grade fever last pm (2000) now afebrile.  Will continue empiric abx -acute blood loss anemia-slight drop in hgb but stable-pt tolerating -encouraged pt to continue walking and using IS -clear liquids as tolerated. -check labs in am -DVT prophylaxis-heparin with eventual bridge to Xarelto for discharge.   Leontine Locket, PA-C [facility] [phone] 05/25/2017 7:44 AM  Addendum  I have independently interviewed and examined the patient, and I agree with the physician assistant's findings.  In bathroom having another bowel movement, so I suspect return of bowel function.  - advance diet as tolerated - continue empiric abx - start Xarelto tomorrow is improvement continues - home once bowel functions fully recovered  Adele Barthel, MD, FACS [facility] [place] Office: [phone] Pager: [phone]  05/25/2017, 9:33 AM

## 2017-05-26 ENCOUNTER — Telehealth: Payer: Self-pay | Admitting: Vascular Surgery

## 2017-05-26 LAB — CBC
HCT: 24 % — ABNORMAL LOW (ref 39.0–52.0)
Hemoglobin: 7.7 g/dL — ABNORMAL LOW (ref 13.0–17.0)
MCH: 27.1 pg (ref 26.0–34.0)
MCHC: 32.1 g/dL (ref 30.0–36.0)
MCV: 84.5 fL (ref 78.0–100.0)
PLATELETS: 404 10*3/uL — AB (ref 150–400)
RBC: 2.84 MIL/uL — AB (ref 4.22–5.81)
RDW: 15.9 % — AB (ref 11.5–15.5)
WBC: 10.9 10*3/uL — AB (ref 4.0–10.5)

## 2017-05-26 LAB — GLUCOSE, CAPILLARY
GLUCOSE-CAPILLARY: 92 mg/dL (ref 65–99)
GLUCOSE-CAPILLARY: 98 mg/dL (ref 65–99)
GLUCOSE-CAPILLARY: 106 mg/dL — AB (ref 65–99)
GLUCOSE-CAPILLARY: 100 mg/dL — AB (ref 65–99)

## 2017-05-26 MED ORDER — CIPROFLOXACIN HCL 500 MG PO TABS
500.0000 mg | ORAL_TABLET | Freq: Two times a day (BID) | ORAL | Status: DC
  Administered 2017-05-26 – 2017-05-27 (×2): 500 mg via ORAL
  Filled 2017-05-26 (×2): qty 1

## 2017-05-26 MED ORDER — RIVAROXABAN 15 MG PO TABS
15.0000 mg | ORAL_TABLET | Freq: Two times a day (BID) | ORAL | Status: DC
Start: 2017-05-26 — End: 2017-05-28
  Administered 2017-05-26 – 2017-05-28 (×5): 15 mg via ORAL
  Filled 2017-05-26 (×5): qty 1

## 2017-05-26 MED ORDER — METRONIDAZOLE 500 MG PO TABS
500.0000 mg | ORAL_TABLET | Freq: Three times a day (TID) | ORAL | Status: DC
  Administered 2017-05-26 – 2017-05-27 (×4): 500 mg via ORAL
  Filled 2017-05-26 (×4): qty 1

## 2017-05-26 MED ORDER — OXYCODONE-ACETAMINOPHEN 5-325 MG PO TABS
1.0000 | ORAL_TABLET | Freq: Four times a day (QID) | ORAL | Status: DC | PRN
  Administered 2017-05-26: 1 via ORAL
  Administered 2017-05-26 – 2017-05-28 (×6): 2 via ORAL
  Administered 2017-05-28: 1 via ORAL
  Filled 2017-05-26: qty 1
  Filled 2017-05-26 (×6): qty 2
  Filled 2017-05-26: qty 1

## 2017-05-26 MED ORDER — MORPHINE SULFATE (PF) 2 MG/ML IV SOLN
2.0000 mg | INTRAVENOUS | Status: DC | PRN
  Administered 2017-05-26 – 2017-05-28 (×2): 2 mg via INTRAVENOUS
  Filled 2017-05-26 (×2): qty 1

## 2017-05-26 NOTE — Discharge Instructions (Addendum)
Information on my medicine - XARELTO (rivaroxaban)  This medication education was reviewed with me or my healthcare representative as part of my discharge preparation.    WHY WAS XARELTO PRESCRIBED FOR YOU? Xarelto was prescribed to treat blood clots that may have been found in the veins of your legs (deep vein thrombosis) or in your lungs (pulmonary embolism) and to reduce the risk of them occurring again.  What do you need to know about Xarelto? The starting dose is one 15 mg tablet taken TWICE daily with food for the FIRST 21 DAYS then on 06/16/2017  the dose is changed to one 20 mg tablet taken ONCE A DAY with your evening meal.  DO NOT stop taking Xarelto without talking to the health care provider who prescribed the medication.  Refill your prescription for 20 mg tablets before you run out.  After discharge, you should have regular check-up appointments with your healthcare provider that is prescribing your Xarelto.  In the future your dose may need to be changed if your kidney function changes by a significant amount.  What do you do if you miss a dose? If you are taking Xarelto TWICE DAILY and you miss a dose, take it as soon as you remember. You may take two 15 mg tablets (total 30 mg) at the same time then resume your regularly scheduled 15 mg twice daily the next day.  If you are taking Xarelto ONCE DAILY and you miss a dose, take it as soon as you remember on the same day then continue your regularly scheduled once daily regimen the next day. Do not take two doses of Xarelto at the same time.   Important Safety Information Xarelto is a blood thinner medicine that can cause bleeding. You should call your healthcare provider right away if you experience any of the following: ? Bleeding from an injury or your nose that does not stop. ? Unusual colored urine (red or dark brown) or unusual colored stools (red or black). ? Unusual bruising for unknown reasons. ? A serious fall  or if you hit your head (even if there is no bleeding).  Some medicines may interact with Xarelto and might increase your risk of bleeding while on Xarelto. To help avoid this, consult your healthcare provider or pharmacist prior to using any new prescription or non-prescription medications, including herbals, vitamins, non-steroidal anti-inflammatory drugs (NSAIDs) and supplements.  This website has more information on Xarelto: [email or web address]    [facility]  Discharge Instructions   Open Aortic Surgery  Please refer to the following instructions for your post-procedure care. Your surgeon or Physician Assistant will discuss any changes with you.  Activity  Avoid lifting more than eight pounds (a gallon of milk) until after your first post-operative visit. You are encouraged to walk as much as you can. You can slowly return to normal activities but must avoid strenuous activity and heavy lifting until your doctor tells you it's OK. Heavy lifting can hurt the incision and cause a hernia. Avoid activities such as vacuuming or swinging a golf club. It is normal to feel tired for several weeks after your surgery. Do not drive until your doctor gives the OK and you are no longer taking prescription pain medications. It is also normal to have difficulty with sleep habits, eating and bowl movements after surgery. These will go away with time.  Bathing/Showering  You may shower after you go home. Do not soak in a bathtub, hot tub,  or swim until the incision heals.  Incision Care  Shower every day. Clean your incision with mild soap and water. Pat the area dry with a clean towel. You do not need a bandage unless otherwise instructed. Do not apply any ointments or creams to your incision. You may have skin glue on your incision. Do not peel it off. It will come off on its own in about one week. If you have staples or sutures along your incision, they will be  removed at your post op appointment.  Diet  Resume your normal diet. There are no special food restriction following this procedure. A low fat/low cholesterol diet is recommended for all patients with vascular disease. After your aortic surgery, it's normal to feel full faster than usual and to not feel as hungry as you normally would. You will probably lose weight initially following your surgery. It's best to eat small, frequent meals over the course of the day. Call the office if you find that you are unable to eat even small meals. In order to heal from your surgery, it is CRITICAL to get adequate nutrition. Your body requires vitamins, minerals, and protein. Vegetables are the best source of vitamins and minerals. causing pain, you may take over-the-counter pain reliever such as acetaminophen (Tylenol). If you were prescribed a stronger pain medication, please be aware these medication can cause nausea and constipation. Prevent nausea by taking the medication with a snack or meal. Avoid constipation by drinking plenty of fluids and eating foods with a high amount of fiber, such as fruits, vegetables and grains. Take 100mg  of the over-the-counter stool softener Colace twice a day as needed to help with constipation. A laxative, such as Milk of Magnesia, may be recommended for you at this time. Do not take a laxative unless your surgeon or P.A. tells you it's OK. Do not take Tylenol if you are taking stronger pain medications (such as Percocet).  Follow Up  Our office will schedule a follow up appointment 2-3 weeks after discharge.  Please call us immediately for any of the following conditions    .     Severe or worsening pain in your legs or feet or in your abdomen back or chest. Increased pain, redness drainage (pus) from your incision site. Increased abdominal pain, bloating, nausea, vomiting, or persistent diarrhea. Fever of 101 degrees or higher. Swelling in your leg (s).  Reduce your  risk of vascular disease  Stop smoking. If you would like help, call [facility] at 1-800-QUIT-NOW [phone]) or [place] at [phone]. Manage your cholesterol Maintain a desired weight Control your diabetes Keep your blood pressure down  If you have any questions please call the office at [phone].

## 2017-05-26 NOTE — Care Management Note (Signed)
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                Action/Plan: NCM will follow for dc needs.   Expected Discharge Date:                 Expected Discharge Plan:  Home/Self Care  In-House Referral:  NA  Discharge planning Services  CM Consult, Medication Assistance  Post Acute Care Choice:  NA Choice offered to:     DME Arranged:    DME Agency:     HH Arranged:    HH Agency:     Status of Service:  In process, will continue to follow  If discussed at Long Length of Stay Meetings, dates discussed:    Discharge Disposition:   Additional Comments:  05/26/17- 1115- Marvetta Gibbons RN, CM-  Pt has been started on Xarelto- benefits check completed for copay cost- -spoke with pt at bedside- cost info shared - $75/mo- provided pt with both 30 day free and copay assist cards to use - pt states he uses [facility] on [address] will continue to follow. Anticipate return home  Dawayne Patricia, RN 05/26/2017, 11:30 AM [phone]

## 2017-05-26 NOTE — Progress Notes (Signed)
ANTICOAGULATION CONSULT NOTE - Follow Up Consult  Pharmacy Consult for Lovenox > Xarelto Indication: superior mesenteric vein thrombus/VTE  No Known Allergies  Patient Measurements: Height: 5\' 6"  (167.6 cm) Weight: 211 lb 11.2 oz (96 kg) IBW/kg (Calculated) : 63.8 Heparin Dosing Weight: 87 kg  Vital Signs: Temp: 99.4 F (37.4 C) (11/28 0407) Temp Source: Oral (11/28 0407) BP: 127/90 (11/28 0407)  Labs: Recent Labs    05/24/17 0519 05/24/17 1350 05/24/17 2033 05/25/17 0224 05/26/17 0317  HGB 8.1*  --   --  7.9* 7.7*  HCT 24.7*  --   --  24.3* 24.0*  PLT 327  --   --  347 404*  HEPARINUNFRC 0.78* 0.74* 0.61 0.61  --   CREATININE 1.01  --   --  1.10  --     Estimated Creatinine Clearance: 91 mL/min (by C-G formula based on SCr of 1.1 mg/dL).   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 Heparin temporarily held due to bloody OG output but OR revealed old hematoma which was evacuated and heparin resumed.   Patient was transitioned to Lovenox yesterday, now to switch to Xarelto for discharge planning.  Last dose of Lovenox given yesterday evening ~2130. Can start Xarelto this morning.  Hgb with slight drop to 7.7, plts stable at 404- no bleeding noted.  Goal of Therapy:  Heparin level 0.3-0.7 units/ml Monitor platelets by anticoagulation protocol: Yes   Plan:  Xarelto 15mg  PO BID x21 days, then on 06/16/2017 switch to Xarelto 20mg  qsupper Daily CBC Will educate prior to discharge  Lamaria Hildebrandt D. Miciah Covelli, PharmD, [place] Clinical Pharmacist Clinical Phone for 05/26/2017 until 3:30pm: [phone] If after 3:30pm, please call main pharmacy at [phone] 05/26/2017 8:20 AM

## 2017-05-26 NOTE — Progress Notes (Signed)
Occupational Therapy Treatment Patient Details Name: Marcus Pruitt MRN: [id number] DOB: August 11, 1970 Today's Date: 05/26/2017    History of present illness Pt is a 46 y.o. male who presented to the [facility] ER on 05/14/17 with c/o sudden onset excruciating mid-abdominal pain; CT showed questionable SMA clot. Pt started on heparin and transferred emergently to [facility] where he was brought to the OR and mesenteric angiogram showed it to actually be an SMA dissection, with multiple false lumens and with clot within it. Intubated 11/16-11/19. Pertinent PMH includes HTN, OSA, prostate cancer s/p resection, LBP, R ear hearing loss.   OT comments  Pt demonstrating progress toward OT goals this session. He was able to complete toilet transfers and toileting hygiene without RW this session requiring only supervision for safety. As pt became fatigued, he did benefit from B UE support from RW during functional mobility in hallway for simulated IADL tasks. Pt continues to demonstrate good motivation to participate despite pain this session. OT will continue to follow while admitted.    Follow Up Recommendations  No OT follow up;Supervision/Assistance - 24 hour    Equipment Recommendations  3 in 1 bedside commode    Recommendations for Other Services      Precautions / Restrictions Precautions Precautions: None Restrictions Weight Bearing Restrictions: No       Mobility Bed Mobility               General bed mobility comments: OOB in chair on my arrival  Transfers Overall transfer level: Needs assistance Equipment used: Rolling walker (2 wheeled);None Transfers: Sit to/from Stand Sit to Stand: Supervision         General transfer comment: Supervision for safety with and without RW.     Balance Overall balance assessment: Needs assistance Sitting-balance support: No upper extremity supported;Feet supported Sitting balance-Leahy Scale: Good Sitting balance - Comments: Pt able to perfrom seated  exercises with no UE support   Standing balance support: No upper extremity supported;Bilateral upper extremity supported;During functional activity Standing balance-Leahy Scale: Fair Standing balance comment: Able to complete dynamic tasks without RW initially but as he fatigued required RW.                            ADL either performed or assessed with clinical judgement   ADL Overall ADL's : Needs assistance/impaired                         Toilet Transfer: Supervision/safety;Ambulation;RW Toilet Transfer Details (indicate cue type and reason): with and without RW Toileting- Clothing Manipulation and Hygiene: Supervision/safety;Sit to/from stand       Functional mobility during ADLs: Supervision/safety;Rolling walker General ADL Comments: Pt able to complete ambulation to bathroom for toilet transfer without RW and good stability this session. As he fatigued requested use of RW for ambulation in hallway.      Vision   Vision Assessment?: No apparent visual deficits   Perception     Praxis      Cognition Arousal/Alertness: Awake/alert Behavior During Therapy: WFL for tasks assessed/performed Overall Cognitive Status: Within Functional Limits for tasks assessed                                          Exercises     Shoulder Instructions       General Comments  Pertinent Vitals/ Pain       Pain Assessment: 0-10 Pain Score: 2  Pain Location: Abdomen Pain Descriptors / Indicators: Pressure;Discomfort;Tightness  Home Living                                          Prior Functioning/Environment              Frequency  Min 2X/week        Progress Toward Goals  OT Goals(current goals can now be found in the care plan section)  Progress towards OT goals: Progressing toward goals  Acute Rehab OT Goals Patient Stated Goal: to get better OT Goal Formulation: With patient/family Time For Goal  Achievement: 05/31/17 Potential to Achieve Goals: Good  Plan Discharge plan remains appropriate    Co-evaluation                 AM-PAC PT "6 Clicks" Daily Activity     Outcome Measure   Help from another person eating meals?: A Little Help from another person taking care of personal grooming?: A Little Help from another person toileting, which includes using toliet, bedpan, or urinal?: A Little Help from another person bathing (including washing, rinsing, drying)?: A Little Help from another person to put on and taking off regular upper body clothing?: A Little Help from another person to put on and taking off regular lower body clothing?: A Little 6 Click Score: 18    End of Session Equipment Utilized During Treatment: Gait belt  OT Visit Diagnosis: Pain;Other abnormalities of gait and mobility (R26.89) Pain - Right/Left: (abdomen) Pain - part of body: (abdomen)   Activity Tolerance Patient tolerated treatment well   Patient Left in chair;with call bell/phone within reach   Nurse Communication Mobility status        Time: 0350-0938 OT Time Calculation (min): 29 min  Charges: OT General Charges $OT Visit: 1 Visit OT Treatments $Self Care/Home Management : 23-37 mins  Norman Herrlich, MS OTR/L  Pager: [place] 05/26/2017, 11:42 AM

## 2017-05-26 NOTE — Progress Notes (Signed)
PCA pump d/c. 8 mL of hydromorphone wasted in sharps. Verified with Estill Bamberg, RN

## 2017-05-26 NOTE — Telephone Encounter (Signed)
Sched appt 06/04/17 at 4:00. Spoke to pt.

## 2017-05-26 NOTE — Telephone Encounter (Signed)
-----   Message from Mena Goes, RN sent at 05/26/2017 12:58 PM EST ----- Regarding: staple removal 06/04/17   ----- Message ----- From: Gabriel Earing, PA-C Sent: 05/26/2017   7:30 AM To: Vvs Charge Pool  F/u with Dr. Bridgett Larsson 06/04/17 and he will need staples removed at that time.  S/p SMA thrombectomy and re-exploration and closure  Thanks, Aldona Bar

## 2017-05-26 NOTE — Progress Notes (Addendum)
  Progress Note    05/26/2017 7:21 AM 6 Days Post-Op  Subjective:  Feeling better; had 4 BM's yesterday  Tm 99.4 HR 70's-90's NSR 161'W-960'A systolic 54% RA  Vitals:   05/26/17 0500 05/26/17 0527  BP:    Pulse:    Resp: 20 16  Temp:    SpO2:  98%    Physical Exam: Cardiac:  regular Lungs:  Non labored Incisions:  Clean and dry with staples in tact Abdomen:  Softer today and less distended; +BMx4; +flatus; -N/V  CBC    Component Value Date/Time   WBC 10.9 (H) 05/26/2017 0317   RBC 2.84 (L) 05/26/2017 0317   HGB 7.7 (L) 05/26/2017 0317   HCT 24.0 (L) 05/26/2017 0317   PLT 404 (H) 05/26/2017 0317   MCV 84.5 05/26/2017 0317   MCH 27.1 05/26/2017 0317   MCHC 32.1 05/26/2017 0317   RDW 15.9 (H) 05/26/2017 0317   LYMPHSABS 1.6 05/24/2017 0519   MONOABS 1.2 (H) 05/24/2017 0519   EOSABS 0.3 05/24/2017 0519   BASOSABS 0.2 (H) 05/24/2017 0519    BMET    Component Value Date/Time   NA 134 (L) 05/25/2017 0224   K 4.8 05/25/2017 0224   CL 101 05/25/2017 0224   CO2 25 05/25/2017 0224   GLUCOSE 105 (H) 05/25/2017 0224   BUN 14 05/25/2017 0224   CREATININE 1.10 05/25/2017 0224   CALCIUM 8.3 (L) 05/25/2017 0224   GFRNONAA >60 05/25/2017 0224   GFRAA >60 05/25/2017 0224    INR    Component Value Date/Time   INR 1.24 05/15/2017 2353     Intake/Output Summary (Last 24 hours) at 05/26/2017 0721 Last data filed at 05/26/2017 0322 Gross per 24 hour  Intake 1250 ml  Output -  Net 1250 ml     Assessment:  46 y.o. male is s/p:  1. Rightcommon femoral arterycannulation under ultrasound guidance 2. Placement of catheter in aorta 3. Aortogram 4. Thrombectomy ofsuperior mesenteric artery 5. Openresection ofsuperior mesenteric arterydissectionflap 6. Bovine patch angioplastysuperior mesenteric artery 7. Selection ofsuperior mesenteric artery 8. Superior mesenteric arteryangiogram 10Days Post-Op And 1.Repair of abdominal wound  dehiscence 2.Evacuation of intra-abdominal hematoma   6 Days Post-Op   Plan: -pt doing well this am-had BM x 4 (2 solid and 2 not solid).  Abdomen is less distended.  Tolerating diet-will advance diet -CMP pending. -acute blood loss anemia-hgb trended down slightly-pt tolerating -d/c PCA and start oral pain medication -leukocytosis improved-? D/c IV abx or change to po--will d/w Dr. Bridgett Larsson -continue IS and mobilization -DVT prophylaxis:  Heparin to Xarelto today -anticipate discharge tomorrow if pt does well today and f/u with Dr. Bridgett Larsson next Friday to have staples removed.    Leontine Locket, PA-C [facility] [phone] 05/26/2017 7:21 AM  Addendum  I have independently interviewed and examined the patient, and I agree with the physician assistant's findings.  Bowel function returning.  Advance diet as tolerated.  Start oral pain medication.  Switch anticoagulation to Xarelto. Ok to switch to PO Cipro and Flagyl for 10 days total of both.  - home likely in the next few days if everything set up for home  Adele Barthel, MD, [facility] [place]: [phone] Pager: [phone]  05/26/2017, 12:21 PM

## 2017-05-26 NOTE — Progress Notes (Signed)
Patient refused CPAP once again, a little agitated but alert and oriented. NO distress noted at this time, RCP will continue to monitor.

## 2017-05-27 ENCOUNTER — Telehealth: Payer: Self-pay

## 2017-05-27 LAB — GLUCOSE, CAPILLARY
GLUCOSE-CAPILLARY: 111 mg/dL — AB (ref 65–99)
GLUCOSE-CAPILLARY: 94 mg/dL (ref 65–99)
Glucose-Capillary: 100 mg/dL — ABNORMAL HIGH (ref 65–99)
Glucose-Capillary: 98 mg/dL (ref 65–99)

## 2017-05-27 LAB — CBC
HCT: 25.7 % — ABNORMAL LOW (ref 39.0–52.0)
Hemoglobin: 8.1 g/dL — ABNORMAL LOW (ref 13.0–17.0)
MCH: 26.9 pg (ref 26.0–34.0)
MCHC: 31.5 g/dL (ref 30.0–36.0)
MCV: 85.4 fL (ref 78.0–100.0)
PLATELETS: 485 10*3/uL — AB (ref 150–400)
RBC: 3.01 MIL/uL — ABNORMAL LOW (ref 4.22–5.81)
RDW: 16 % — AB (ref 11.5–15.5)
WBC: 10.3 10*3/uL (ref 4.0–10.5)

## 2017-05-27 MED ORDER — RIVAROXABAN (XARELTO) VTE STARTER PACK (15 & 20 MG): ORAL_TABLET | ORAL | 0 refills | Status: DC

## 2017-05-27 MED ORDER — METRONIDAZOLE 500 MG PO TABS: 500.0000 mg | ORAL_TABLET | Freq: Three times a day (TID) | ORAL | 0 refills | Status: DC

## 2017-05-27 MED ORDER — CIPROFLOXACIN HCL 500 MG PO TABS: 500.0000 mg | ORAL_TABLET | Freq: Two times a day (BID) | ORAL | 0 refills | Status: DC

## 2017-05-27 MED ORDER — RIVAROXABAN 20 MG PO TABS: 20.0000 mg | ORAL_TABLET | Freq: Every day | ORAL | 11 refills | Status: DC

## 2017-05-27 MED ORDER — OXYCODONE-ACETAMINOPHEN 5-325 MG PO TABS: 1.0000 | ORAL_TABLET | Freq: Four times a day (QID) | ORAL | 0 refills | Status: DC | PRN

## 2017-05-27 MED ORDER — CIPROFLOXACIN HCL 500 MG PO TABS
500.0000 mg | ORAL_TABLET | Freq: Two times a day (BID) | ORAL | Status: DC
  Administered 2017-05-27 – 2017-05-28 (×2): 500 mg via ORAL
  Filled 2017-05-27 (×2): qty 1

## 2017-05-27 MED ORDER — METRONIDAZOLE 500 MG PO TABS
500.0000 mg | ORAL_TABLET | Freq: Three times a day (TID) | ORAL | Status: DC
  Administered 2017-05-27 – 2017-05-28 (×2): 500 mg via ORAL
  Filled 2017-05-27 (×2): qty 1

## 2017-05-27 NOTE — Progress Notes (Addendum)
Progress Note    05/27/2017 7:29 AM 7 Days Post-Op  Subjective:  Says he had several loose BM's yesterday and this am after eating.  For the past 3-4 days has woken up sweaty and cold, but this morning was a little better.   Tm 100.1 now 98.8 HR 35'H-29'J NSR 242'A systolic 834% RA  Vitals:   05/26/17 2319 05/27/17 0420  BP: 136/88 132/90  Pulse: 85 81  Resp: (!) 25 16  Temp: 98.9 F (37.2 C) 98.8 F (37.1 C)  SpO2: 100% 100%    Physical Exam: Cardiac:  regular Lungs:  Non labored Incisions:  Clean and dry with staples in tact. Abdomen:  Soft, NT/ND; +BM  CBC    Component Value Date/Time   WBC 10.3 05/27/2017 0448   RBC 3.01 (L) 05/27/2017 0448   HGB 8.1 (L) 05/27/2017 0448   HCT 25.7 (L) 05/27/2017 0448   PLT 485 (H) 05/27/2017 0448   MCV 85.4 05/27/2017 0448   MCH 26.9 05/27/2017 0448   MCHC 31.5 05/27/2017 0448   RDW 16.0 (H) 05/27/2017 0448   LYMPHSABS 1.6 05/24/2017 0519   MONOABS 1.2 (H) 05/24/2017 0519   EOSABS 0.3 05/24/2017 0519   BASOSABS 0.2 (H) 05/24/2017 0519    BMET    Component Value Date/Time   NA 134 (L) 05/25/2017 0224   K 4.8 05/25/2017 0224   CL 101 05/25/2017 0224   CO2 25 05/25/2017 0224   GLUCOSE 105 (H) 05/25/2017 0224   BUN 14 05/25/2017 0224   CREATININE 1.10 05/25/2017 0224   CALCIUM 8.3 (L) 05/25/2017 0224   GFRNONAA >60 05/25/2017 0224   GFRAA >60 05/25/2017 0224    INR    Component Value Date/Time   INR 1.24 05/15/2017 2353     Intake/Output Summary (Last 24 hours) at 05/27/2017 0729 Last data filed at 05/27/2017 0453 Gross per 24 hour  Intake 11.7 ml  Output -  Net 11.7 ml     Assessment:  46 y.o. male is s/p:  1. Rightcommon femoral arterycannulation under ultrasound guidance 2. Placement of catheter in aorta 3. Aortogram 4. Thrombectomy ofsuperior mesenteric artery 5. Openresection ofsuperior mesenteric arterydissectionflap 6. Bovine patch angioplastysuperior mesenteric  artery 7. Selection ofsuperior mesenteric artery 8. Superior mesenteric arteryangiogram 11Days Post-Op And 1.Repair of abdominal wound dehiscence 2.Evacuation of intra-abdominal hematoma  7 Days Post-Op   Plan: -pt with several loose stools yesterday and a couple this morning.  RN called yesterday for order for C diff, but do not see preliminary report.   Suspect this is not C diff as pt has only been on Abx for 3 days.  He is also on Flagyl.   -will keep another day or two before discharge.   -check labs daily -leukocytosis improving but still with low grade fever over night.  Encouraged pt to continue using IS and walking. -acute blood loss anemia-hgb improving. -DVT prophylaxis:  Xarelto   Leontine Locket, PA-C [facility] [phone] 05/27/2017 7:29 AM   Addendum  I have independently interviewed and examined the patient, and I agree with the physician assistant's findings.  Suspect diarrhea is just resolution of ileus.  C-diff sent as precaution.  Pt on cipro/flagyl for possible intra-abd infection of residual hematoma pockets from prior procedures.  WBC coming down as is the fever curve.    - Hopefully diarrhea and fevers resolve over the next 1-2 days - Ok to d/c once afebrile and C-diff result come back  Adele Barthel, MD, [facility] [place] Office: [phone] Pager: [phone]  05/27/2017, 4:56 PM

## 2017-05-27 NOTE — Telephone Encounter (Signed)
Spoke with pt regarding STD paperwork letting him know a $29 fee needs to be paid before I can release papers. Patient stated he would call back tomorrow.

## 2017-05-27 NOTE — Discharge Summary (Signed)
Discharge Summary    Marcus Pruitt 01/05/1971 46 y.o. male  [id number]  Admission Date: 05/14/2017  Discharge Date: 05/28/17  Physician: Conrad Churdan, MD  Admission Diagnosis: Occlusion of superior mesenteric artery [facility]) [K55.069]   HPI:   This is a 46 y.o. male  who presents with cc: abdominal pain.  Reported pt presented initially with pain out of proportion to examination.   CT abd/pelvis was ordered due for evaluation of abdominal pain.  Pt noted onset pain this PM, reportedly around 8 PM.  +N/-emesis.  No hematochezia, melena or hematemesis.  Pt denies any cardiac arrhythmia.  Pt's abd pain has abated somewhat with narcotic administration.   Hospital Course:  The patient was admitted to the hospital and taken to the operating room on 05/20/2017 and underwent: 1. Right common femoral artery cannulation under ultrasound guidance 2. Placement of catheter in aorta 3. Aortogram 4. Thrombectomy of superior mesenteric artery 5. 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 Bovine patch angioplasty superior mesenteric artery  7. Selection of superior mesenteric artery  8. Superior mesenteric artery angiogram  FINDING(S): 1.  Ectatic superior mesenteric artery (12-14 mm) with external evidence of dissection 2.  On A-P aortogram: possible occlusion of SMA shortly after take off of SMA 3.  On Lateral Aortogram: compromised flow in distal 1/2 of superior mesenteric artery on initial aortogram with thrombus vs dissection flap in proximal segment of superior mesenteric artery  4.  Patent celiac artery 5.  Viable bowel throughout 6.  Four fenestrations of the superior mesenteric artery with obvious thrombus in false lumen 7.  Multiple phasic signals in superior mesenteric artery after resection dissection flap and bovine patch angioplasty 8.  Some distal stenosis >50% in superior mesenteric artery past the first order bifurcation in superior mesenteric artery:  waveforms not consistent with such  The pt tolerated the procedure well and was transported to the ICU intubated due to extended ventilation, hx of sleep apnea, and anticipated vigorous resuscitation needed in ICU.    The pt was started on heparin post operatively for SMV thrombus.    By POD 2, he had a Tm of 102.  His WBC was normal and abdominal exam was unremarkable.  Will need to follow closely and consider CT scan to look for ischemic bowel if he has a persistent fever or develops an elevated white blood cell count.  He is currently not on antibiotics.  I will start him on cefepime DAY 1.  He was extubated on POD 2.   On POD 3, he was hemodynamically stable not requiring pressors and was doing well extubated.  He had good UOP and normal creatinine.  His Tm was 99 and WBC of 13k--he is on day 2 of Cefepime.   On POD 4, his abdominal pain had significantly improved, but still no flatus or BM.  He will keep the NGT until his bowel function returns.  He was requiring Cardene and therefore left in the ICU.  He did have acute blood loss anemia and was transfused 2 units.    On POD 5, he had minimal bowel function and ws continued on TNA.  He appeared to have some oozing from the staple line at the umbilicus.  A staple was removed and subsequently, he developed significant oozing of old blood, which suggest intra abdominal old blood and a small fascial dehiscence.   He continued to have significant drainage from the midportion of his incision and therefore was taken back to the  operating room for re-closure of his fascia.    On 05/20/17, he was taken to the operating room and underwent:  1.  Repair of abdominal wound dehiscence 2.  Evacuation of intra-abdominal hematoma  Intraoperative findings as follows: There was a large amount of old blood and hematoma in the abdomen which was all evacuated and the abdomen was irrigated.  The small intestine was run and looked viable throughout.  The superior  mesenteric artery had a good pulse and also a good Doppler signal.  On POD 6/1, he still had no flatus or BM or bowel sounds.  His intra-abdominal hematoma may have been contributing to his ileus.  His bowel appeared well perfused.  We will continue NG tube until bowel function returns and then hopefully can start diet soon.  The NG tube has had minimal drainage.  However, in the meantime he is receiving TPN.  Given the fact that his bladder was distended at the time of surgery, his foley was left in another day.    On POD 7/2, he had started passing flatus and had a BM.  The NGT was removed and he was kept npo but continued TNA.  Continue IV heparin.   On POD 8/3, he was passing more flatus and he was advanced to a clear liquid diet.  Increase mobilization.  He had a low grade fever overnight with mild leukocytosis, which was most likely related to atelectasis.  He is encouraged to continue his IS and mobilize.    On POD 9/4, he continued to have a low grade fever and leukocytosis.  A flat and upright xray was obtained.  He was started on empiric IV abx.  He did have an elevated alk phos and his liver enzymes were elevated and an u/s was ordered.  He was made npo.   - No gallbladder findings on [place], so LFT and Alkphos might be related to TPN - Finish TPN bag.  Don't order another bag. - I reviewed the Abd films and I don't think they are c/w SBO.  His presentation also isn't c/w such.  Probably some residual ileus. - d/c cefepime given chg to Cipro/Flagyl  On POD 10/5, he was feeling a little bit better.  He had a small BM and flatus. His PCA was transitioned to po pain medication.  His alk phos decreased.  TPN was turned off.  He did have slight improvement in his leukocytosis.    On POD 10/6, is abdomen was less distended.  He had 4 bowel movements (2 solid and 2 loose).  He was tolerating diet.  His heparin was transitioned to Xarelto.  His abx were switched to po Cipro and Flagyl for a total of  10 days for both.    On POD 11/7, he had had more loose stools and RN had called for C diff order, but no preliminary report for this.  His leukocytosis continued to improve.  His acute blood loss anemia also continued to improve.  Suspect diarrhea is just resolution of ileus.  C-diff sent as precaution.  Pt on cipro/flagyl for possible intra-abd infection of residual hematoma pockets from prior procedures.  WBC coming down as is the fever curve.    On POD 12/8, C diff was apparently not ordered and therefore ordered.   C diff was negative.  He was discharged home.   The remainder of the hospital course consisted of increasing mobilization and increasing intake of solids without difficulty.  CBC    Component  Value Date/Time   WBC 10.3 05/27/2017 0448   RBC 3.01 (L) 05/27/2017 0448   HGB 8.1 (L) 05/27/2017 0448   HCT 25.7 (L) 05/27/2017 0448   PLT 485 (H) 05/27/2017 0448   MCV 85.4 05/27/2017 0448   MCH 26.9 05/27/2017 0448   MCHC 31.5 05/27/2017 0448   RDW 16.0 (H) 05/27/2017 0448   LYMPHSABS 1.6 05/24/2017 0519   MONOABS 1.2 (H) 05/24/2017 0519   EOSABS 0.3 05/24/2017 0519   BASOSABS 0.2 (H) 05/24/2017 0519    BMET    Component Value Date/Time   NA 134 (L) 05/25/2017 0224   K 4.8 05/25/2017 0224   CL 101 05/25/2017 0224   CO2 25 05/25/2017 0224   GLUCOSE 105 (H) 05/25/2017 0224   BUN 14 05/25/2017 0224   CREATININE 1.10 05/25/2017 0224   CALCIUM 8.3 (L) 05/25/2017 0224   GFRNONAA >60 05/25/2017 0224   GFRAA >60 05/25/2017 0224        Discharge Diagnosis:  Occlusion of superior mesenteric artery (HCC) [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 Vascular disorder of small intestine ([place]) 05/15/2017  . Dissection of unspecified artery ([place]) 05/15/2017  . Prostate cancer ([place]) 02/21/2016  . Blepharitis of eyelid of right eye 05/08/2014  . HTN (hypertension) 03/30/2014   . OBSTRUCTIVE SLEEP APNEA 08/04/2010  . SNORING 07/21/2010  . ERECTILE DYSFUNCTION, ORGANIC 06/13/2010  . STRESS REACTION, ACUTE, WITH EMOTIONAL DISTURBANCE 04/18/2009  . INSOMNIA 04/18/2009  . Unspecified hearing loss 03/06/2009  . ALLERGIC RHINITIS 03/06/2009  . KNEE PAIN, BILATERAL 03/06/2009  . LOW BACK PAIN 03/06/2009  . PES PLANUS 03/06/2009   Past Medical History:  Diagnosis Date  . Allergy   . Cancer ([place])   . Hearing loss in right ear   . Hypertension   . Knee pain   . Low back pain   . Sleep apnea      Allergies as of 05/27/2017   No Known Allergies     Medication List    STOP taking these medications   benzonatate 100 MG capsule Commonly known as:  TESSALON   cyclobenzaprine 5 MG tablet Commonly known as:  FLEXERIL   predniSONE 10 MG tablet Commonly known as:  DELTASONE     TAKE these medications   ciprofloxacin 500 MG tablet Commonly known as:  CIPRO Take 1 tablet (500 mg total) by mouth 2 (two) times daily.   lisinopril-hydrochlorothiazide 10-12.5 MG tablet Commonly known as:  PRINZIDE,ZESTORETIC Take 1 tablet by mouth daily.   metroNIDAZOLE 500 MG tablet Commonly known as:  FLAGYL Take 1 tablet (500 mg total) by mouth every 8 (eight) hours.   oxyCODONE-acetaminophen 5-325 MG tablet Commonly known as:  PERCOCET/ROXICET Take 1 tablet by mouth every 6 (six) hours as needed for moderate pain.   Rivaroxaban 15 & 20 MG Tbpk Take as directed on package: Start with one 55m tablet by mouth twice a day with food. On Day 22, switch to one 254mtablet once a day with food.   rivaroxaban 20 MG Tabs tablet Commonly known as:  XARELTO Take 1 tablet (20 mg total) by mouth daily with supper.       Instructions: Information on my medicine - XARELTO (rivaroxaban)  This medication education was reviewed with me or my healthcare representative as part of my discharge preparation.    WHY WAS XARELTO PRESCRIBED FOR YOU? Xarelto was prescribed to  treat blood  clots that may have been found in the veins of your legs (deep vein thrombosis) or in your lungs (pulmonary embolism) and to reduce the risk of them occurring again.  What do you need to know about Xarelto? The starting dose is one 15 mg tablet taken TWICE daily with food for the FIRST 21 DAYS then on 06/16/2017  the dose is changed to one 20 mg tablet taken ONCE A DAY with your evening meal.  DO NOT stop taking Xarelto without talking to the health care provider who prescribed the medication.  Refill your prescription for 20 mg tablets before you run out.  After discharge, you should have regular check-up appointments with your healthcare provider that is prescribing your Xarelto.  In the future your dose may need to be changed if your kidney function changes by a significant amount.  What do you do if you miss a dose? If you are taking Xarelto TWICE DAILY and you miss a dose, take it as soon as you remember. You may take two 15 mg tablets (total 30 mg) at the same time then resume your regularly scheduled 15 mg twice daily the next day.  If you are taking Xarelto ONCE DAILY and you miss a dose, take it as soon as you remember on the same day then continue your regularly scheduled once daily regimen the next day. Do not take two doses of Xarelto at the same time.   Important Safety Information Xarelto is a blood thinner medicine that can cause bleeding. You should call your healthcare provider right away if you experience any of the following: ? Bleeding from an injury or your nose that does not stop. ? Unusual colored urine (red or dark brown) or unusual colored stools (red or black). ? Unusual bruising for unknown reasons. ? A serious fall or if you hit your head (even if there is no bleeding).  Some medicines may interact with Xarelto and might increase your risk of bleeding while on Xarelto. To help avoid this, consult your healthcare provider or pharmacist prior to  using any new prescription or non-prescription medications, including herbals, vitamins, non-steroidal anti-inflammatory drugs (NSAIDs) and supplements.  This website has more information on Xarelto: [email or web address]    [facility]  Discharge Instructions   Open Aortic Surgery  Please refer to the following instructions for your post-procedure care. Your surgeon or Physician Assistant will discuss any changes with you.  Activity  Avoid lifting more than eight pounds (a gallon of milk) until after your first post-operative visit. You are encouraged to walk as much as you can. You can slowly return to normal activities but must avoid strenuous activity and heavy lifting until your doctor tells you it's OK. Heavy lifting can hurt the incision and cause a hernia. Avoid activities such as vacuuming or swinging a golf club. It is normal to feel tired for several weeks after your surgery. Do not drive until your doctor gives the OK and you are no longer taking prescription pain medications. It is also normal to have difficulty with sleep habits, eating and bowl movements after surgery. These will go away with time.  Bathing/Showering  You may shower after you go home. Do not soak in a bathtub, hot tub, or swim until the incision heals.  Incision Care  Shower every day. Clean your incision with mild soap and water. Pat the area dry with a clean towel. You do not need a bandage unless otherwise instructed. Do not  apply any ointments or creams to your incision. You may have skin glue on your incision. Do not peel it off. It will come off on its own in about one week. If you have staples or sutures along your incision, they will be removed at your post op appointment.  Diet  Resume your normal diet. There are no special food restriction following this procedure. A low fat/low cholesterol diet is recommended for all patients with vascular disease. After your aortic  surgery, it's normal to feel full faster than usual and to not feel as hungry as you normally would. You will probably lose weight initially following your surgery. It's best to eat small, frequent meals over the course of the day. Call the office if you find that you are unable to eat even small meals. In order to heal from your surgery, it is CRITICAL to get adequate nutrition. Your body requires vitamins, minerals, and protein. Vegetables are the best source of vitamins and minerals. causing pain, you may take over-the-counter pain reliever such as acetaminophen (Tylenol). If you were prescribed a stronger pain medication, please be aware these medication can cause nausea and constipation. Prevent nausea by taking the medication with a snack or meal. Avoid constipation by drinking plenty of fluids and eating foods with a high amount of fiber, such as fruits, vegetables and grains. Take 134m of the over-the-counter stool softener Colace twice a day as needed to help with constipation. A laxative, such as Milk of Magnesia, may be recommended for you at this time. Do not take a laxative unless your surgeon or P.A. tells you it's OK.  Do not take Tylenol if you are taking stronger pain medications (such as Percocet).  Follow Up  Our office will schedule a follow up appointment 2-3 weeks after discharge.  Please call [place]mediately for any of the following conditions    .     Severe or worsening pain in your legs or feet or in your abdomen back or chest. Increased pain, redness drainage (pus) from your incision site. Increased abdominal pain, bloating, nausea, vomiting, or persistent diarrhea. Fever of 101 degrees or higher. Swelling in your leg (s).  Reduce your risk of vascular disease  Stop smoking. If you would like help, call [facility] at 1-800-QUIT-NOW (204-362-2907 or [place]at 3249-074-1503 Manage your cholesterol Maintain a desired weight Control your diabetes Keep your blood  pressure down  If you have any questions please call the office at [phone]    Prescriptions given: 1.  Roxicet #30 No Refill 2.  Flagyl 5064mtid #24 No Refill 3.  Cipro 50023mid #12 No refill 4.  Xarelto starter pack 5.  Xarelto 61m6mily #30 11Refills (to start when he has completed his Xarelto starter pack)  Disposition: home  Patient's condition: is Good  Follow up: 1. Dr. ChenBridgett LarssonDecember 7, 2018 at [id number].-[facility] [phone]/2018  8:08 AM   Addendum  Marcus Pruitt 46 y83. ([birth year]le presented with sx consistent with acute mesenteric ischemia.  CT abd/pelvis with contrast was concerning for possible thrombosis of SMA.  He taken to OR for mesenteric angiogram.  PA/Lateral aortogram suggested possible thrombus vs dissection in proximal SMA.  Given the AMI presentation, I felt this represented a sx dissection even if it was a dissection so exploration was indicated.  He underwent exploratory celiotomy with viable intestines.  An exposure of the SMA at the root of  mesentery was completed.  The SMA was found to be enlarged with external signs of possible dissection.  I elected to explore the SMA given concerns of possible thrombosis of the SMA.  I found multiple entry and rentry tears in the intimal with thrombosis of presumed false lumens.  I felt this put this patient at risk of acute embolism given the multiple entry and rentry tears.  I did a endarterectomy of the SMA from the genu of the proximal artery down to the first order division.  I passed a Fogarty catheter distally in the SMA and retrieved an intimal flap suggesting that a portion of the flap had embolized distally.  This returned significantly improved backbleeding from the SMA.  I sewed a bovine patch onto the SMA due to concerns of lumen compromise, I also had concerns with the integrity of a direct repair.  On completion angiogram, I felt the SMA  perfusion had been preserved with some non-hemodynamically significant stenosis distal to the first order branches.  At the end of the case, all intestines appeared viable.  The patient's abdominal wall was repaired in the standard fashion, taking care to try to repair the area of prior umbilical hernia repair.  The patient was started on no bolus heparin drip given the severity of the disease in the SMA.  I felt that maintaining perfusion of the SMA was more important and possible intra-abdominal bleeding.  Post-operatively this patient was transferred to the ICU on ventilator.  He was slowly weaned off the ventilator and Dr. Scot Dock took over care for me for roughly a week.  During this time, the patient slowly recovered but developed a small dehiscence at the prior umbilical hernia repair site.  He was brought back to the OR and a washout and exploration was completed.  Pelvic hematoma was evacuated and the fascia was reapproximated.  After this, the patient's recovery accelerated.  He regained bowel function but had recurrent low grade fevers.  I felt this might be due to infect of residual intra-abdominal hematoma, so he placed on Cipro/Flagyl to empirically treat such.  His fever curve and WBC appropriately responded to such therapy.  He was finally transitioned to Xarelto for anticoagulation.  The plan is anticoagulation for 3 months to help the SMA heal and remodel.  With plan to repeat CTA abd/pelvis in 3 months.  The patient will finish 10 days of Cipro/Flagyl.  Staples will come out at 10-14 days total.   Adele Barthel, MD, [facility] [place] Office: [phone] Pager: [phone]  06/01/2017, 8:52 PM

## 2017-05-28 DIAGNOSIS — Z0279 Encounter for issue of other medical certificate: Secondary | ICD-10-CM

## 2017-05-28 LAB — COMPREHENSIVE METABOLIC PANEL
ALK PHOS: 169 U/L — AB (ref 38–126)
ALT: 113 U/L — AB (ref 17–63)
AST: 73 U/L — AB (ref 15–41)
Albumin: 2.7 g/dL — ABNORMAL LOW (ref 3.5–5.0)
Anion gap: 6 (ref 5–15)
BILIRUBIN TOTAL: 0.6 mg/dL (ref 0.3–1.2)
BUN: 12 mg/dL (ref 6–20)
CALCIUM: 8.1 mg/dL — AB (ref 8.9–10.3)
CHLORIDE: 107 mmol/L (ref 101–111)
CO2: 20 mmol/L — ABNORMAL LOW (ref 22–32)
CREATININE: 1.01 mg/dL (ref 0.61–1.24)
Glucose, Bld: 110 mg/dL — ABNORMAL HIGH (ref 65–99)
Potassium: 4 mmol/L (ref 3.5–5.1)
Sodium: 133 mmol/L — ABNORMAL LOW (ref 135–145)
TOTAL PROTEIN: 6.5 g/dL (ref 6.5–8.1)

## 2017-05-28 LAB — CBC
HEMATOCRIT: 25.6 % — AB (ref 39.0–52.0)
Hemoglobin: 8.1 g/dL — ABNORMAL LOW (ref 13.0–17.0)
MCH: 27.2 pg (ref 26.0–34.0)
MCHC: 31.6 g/dL (ref 30.0–36.0)
MCV: 85.9 fL (ref 78.0–100.0)
PLATELETS: 498 10*3/uL — AB (ref 150–400)
RBC: 2.98 MIL/uL — AB (ref 4.22–5.81)
RDW: 16 % — ABNORMAL HIGH (ref 11.5–15.5)
WBC: 10.6 10*3/uL — AB (ref 4.0–10.5)

## 2017-05-28 LAB — C DIFFICILE QUICK SCREEN W PCR REFLEX
C DIFFICILE (CDIFF) INTERP: NOT DETECTED
C DIFFICILE (CDIFF) TOXIN: NEGATIVE
C Diff antigen: NEGATIVE

## 2017-05-28 MED ORDER — SIMETHICONE 80 MG PO CHEW
80.0000 mg | CHEWABLE_TABLET | Freq: Four times a day (QID) | ORAL | Status: DC | PRN
  Filled 2017-05-28: qty 1

## 2017-05-28 NOTE — Progress Notes (Signed)
Physical Therapy Treatment Patient Details Name: Demitri Kucinski MRN: [id number] DOB: 04-May-1971 Today's Date: 05/28/2017    History of Present Illness Pt is a 46 y.o. male who presented to the [facility] ER on 05/14/17 with c/o sudden onset excruciating mid-abdominal pain; CT showed questionable SMA clot. Pt started on heparin and transferred emergently to [facility] where he was brought to the OR and mesenteric angiogram showed it to actually be an SMA dissection, with multiple false lumens and with clot within it. Intubated 11/16-11/19. Pertinent PMH includes HTN, OSA, prostate cancer s/p resection, LBP, R ear hearing loss.   PT Comments    Pt has progressed very well with mobility. Focused on bed mobility to simulate home environment and importance of laying flat to stretch abdominal incision as tolerated. Amb 600' independently. Pt will be d/c'ing home with 21 steps to enter home; mod indep for stairs with rail. Pt has met short-term acute PT goals. All education completed and questions answered. Encouraged to continue ambulating during hospital stay. Feel pt is safe to d/c home independently. D/c acute PT.   Follow Up Recommendations  No PT follow up;Supervision - Intermittent     Equipment Recommendations  None recommended by PT    Recommendations for Other Services       Precautions / Restrictions Precautions Precautions: None Restrictions Weight Bearing Restrictions: No    Mobility  Bed Mobility Overal bed mobility: Independent             General bed mobility comments: Bed flat, adjusted to height of bed at home, no rails; good technique  Transfers Overall transfer level: Independent Equipment used: None Transfers: Sit to/from Stand Sit to Stand: Independent            Ambulation/Gait Ambulation/Gait assistance: Independent Ambulation Distance (Feet): 600 Feet Assistive device: None Gait Pattern/deviations: Step-through pattern;Decreased stride length Gait velocity:  Decreased Gait velocity interpretation: <1.8 ft/sec, indicative of risk for recurrent falls General Gait Details: Slow, controlled amb with no AD. Pt holding lower abdomen secondary to increased pressure   Stairs Stairs: Yes   Stair Management: One rail Right;Alternating pattern;Forwards Number of Stairs: 21 General stair comments: Ascend/descended mod indep with single UE support on rail; good technique  Wheelchair Mobility    Modified Rankin (Stroke Patients Only)       Balance     Sitting balance-Leahy Scale: Good Sitting balance - Comments: Donned socks sitting EOB indep     Standing balance-Leahy Scale: Good                              Cognition Arousal/Alertness: Awake/alert Behavior During Therapy: WFL for tasks assessed/performed Overall Cognitive Status: Within Functional Limits for tasks assessed                                        Exercises      General Comments        Pertinent Vitals/Pain Pain Assessment: Faces Faces Pain Scale: Hurts a little bit Pain Location: Abdomen Pain Descriptors / Indicators: Pressure;Discomfort;Tightness Pain Intervention(s): Monitored during session    Home Living                      Prior Function            PT Goals (current goals can now be found in the care  plan section) Acute Rehab PT Goals Patient Stated Goal: to get better PT Goal Formulation: With patient Time For Goal Achievement: 05/31/17 Potential to Achieve Goals: Good Progress towards PT goals: Goals met/education completed, patient discharged from PT    Frequency           PT Plan Current plan remains appropriate    Co-evaluation              AM-PAC PT "6 Clicks" Daily Activity  Outcome Measure  Difficulty turning over in bed (including adjusting bedclothes, sheets and blankets)?: None Difficulty moving from lying on back to sitting on the side of the bed? : None Difficulty sitting down  on and standing up from a chair with arms (e.g., wheelchair, bedside commode, etc,.)?: None Help needed moving to and from a bed to chair (including a wheelchair)?: None Help needed walking in hospital room?: None Help needed climbing 3-5 steps with a railing? : None 6 Click Score: 24    End of Session Equipment Utilized During Treatment: Gait belt Activity Tolerance: Patient tolerated treatment well Patient left: in bed;with call bell/phone within reach Nurse Communication: Mobility status PT Visit Diagnosis: Other abnormalities of gait and mobility (R26.89);Pain Pain - part of body: (Abdomen)     Time: 4128-2081 PT Time Calculation (min) (ACUTE ONLY): 23 min  Charges:  $Gait Training: 8-22 mins $Therapeutic Activity: 8-22 mins                    G Codes:      Mabeline Caras, PT, DPT Acute Rehab Services  Pager: [place] 05/28/2017, 8:33 AM

## 2017-05-28 NOTE — Progress Notes (Signed)
Pt to D/C home per order. Reviewed/educated pt and girlfriend on d/c medications, instructions, follow-up appointments, when to call the MD. Answered all questions. Pt has all paperwork, Rx and belongings. D/C via wheelchair with girlfriend. Pt stable and much improved. Pola Corn, RN

## 2017-05-28 NOTE — Progress Notes (Addendum)
[facility] had a few loose stools, no chills and no fever.  Pain controlled with scheduled PO percocet.   Objective (!) 137/91 80 98.3 F (36.8 C) (Oral) 20 98%  Intake/Output Summary (Last 24 hours) at 05/28/2017 0750 Last data filed at 05/27/2017 1608 Gross per 24 hour  Intake 30 ml  Output -  Net 30 ml    Palpable pedal pulses Abdomin soft, incision healing well. Heart RRR Lungs non labored  Assessment/Planning: Assessment:  46 y.o. male is s/p:  1. Rightcommon femoral arterycannulation under ultrasound guidance 2. Placement of catheter in aorta 3. Aortogram 4. Thrombectomy ofsuperior mesenteric artery 5. Openresection ofsuperior mesenteric arterydissectionflap 6. Bovine patch angioplastysuperior mesenteric artery 7. Selection ofsuperior mesenteric artery 8. Superior mesenteric arteryangiogram 12Days Post-Op And 1.Repair of abdominal wound dehiscence 2.Evacuation of intra-abdominal hematoma  8 Days Post-Op    No c diff was ordered yesterday that I can find.  I ordered it this am. Xarelto started WBC 10.6 slight increase from yesterday 10.3 Afebrile HGB 8.1 no change from yesterday stable Pending C diff and Dr.Chens partner review of the patient   Marcus Pruitt 05/28/2017 7:50 AM --  Laboratory Lab Results: Recent Labs    05/27/17 0448 05/28/17 0505  WBC 10.3 10.6*  HGB 8.1* 8.1*  HCT 25.7* 25.6*  PLT 485* 498*   BMET Recent Labs    05/28/17 0505  NA 133*  K 4.0  CL 107  CO2 20*  GLUCOSE 110*  BUN 12  CREATININE 1.01  CALCIUM 8.1*    COAG Lab Results  Component Value Date   INR 1.24 05/15/2017   INR 1.19 05/15/2017   INR 0.97 05/15/2017   No results found for: PTT   I have independently interviewed and examined patient and agree with PA assessment and plan above. Abdominal exam is benign, no leukocytosis and fever curve improved.   Marcus Pruitt C.  Marcus Matters, MD [name] Office: [phone] Pager: [phone]

## 2017-05-28 NOTE — Progress Notes (Signed)
Pt continuously refuses to wear CPAP. CPAP machine is at bedside if pt changes mind. Advised pt to call if needed anything.

## 2017-05-28 NOTE — Care Management Note (Signed)
Case Management Note Original Note Created Zenon Mayo, RN 05/19/2017, 11:31 AM  Patient Details  Name: Marcus Pruitt MRN: [id number] Date of Birth: 19-Jan-1971  Subjective/Objective:    From home,  POD 4 mesenteric artery dissection, conts on heparin, will need NG tube until bowel function returns, on Cardene drip, has ABLA, will be transfused 2 units.                Action/Plan: NCM will follow for dc needs.   Expected Discharge Date:                 Expected Discharge Plan:  Home/Self Care  In-House Referral:  NA  Discharge planning Services  CM Consult, Medication Assistance  Post Acute Care Choice:  NA Choice offered to:     DME Arranged:    DME Agency:     HH Arranged:    HH Agency:     Status of Service:  Completed, signed off  If discussed at [place] of Stay Meetings, dates discussed:    Discharge Disposition: home/self care   Additional Comments:  05/28/17- 1030- Kylynn Street RN, CM- pt for d/c home today- no further CM needs noted for discharge.   05/26/17- 1115- Marvetta Gibbons RN, CM-  Pt has been started on Xarelto- benefits check completed for copay cost- -spoke with pt at bedside- cost info shared - $75/mo- provided pt with both 30 day free and copay assist cards to use - pt states he uses [facility] on [address] will continue to follow. Anticipate return home  Dawayne Patricia, RN 05/28/2017, 10:38 AM [phone]

## 2017-05-31 ENCOUNTER — Telehealth: Payer: Self-pay | Admitting: Family Medicine

## 2017-05-31 NOTE — Telephone Encounter (Signed)
Transition Care Management Follow-up Telephone Call   Date discharged? 05/28/2017   How have you been since you were released from the hospital? Hard to get comfortable while trying to sleep, some pain during positioning   Do you understand why you were in the hospital? yes    Do you understand the discharge instructions? yes   Where were you discharged to? home    Items Reviewed:  Medications reviewed: yes  Allergies reviewed: yes  Dietary changes reviewed: no  Referrals reviewed: no   Functional Questionnaire:   Activities of Daily Living (ADLs):   He states they are independent in the following: ambulation, bathing and hygiene, feeding, continence, grooming, toileting and dressing States they require assistance with the following: none   Any transportation issues/concerns?: no   Any patient concerns? no   Confirmed importance and date/time of follow-up visits scheduled no, patient will call back to schedule, wanted to wait until he see's cardiology this week.   Confirmed with patient if condition begins to worsen call PCP or go to the ER.  Patient was given the office number and encouraged to call back with question or concerns.  : yes

## 2017-06-02 NOTE — Progress Notes (Signed)
    Post-operative Open AAA Repair   History of Present Illness   Marcus Pruitt is a 46 y.o. male who presents post-op s/p TE SMA, open resection of SMA dissection flap, BPA SMA (05/15/17) and X-lap, repair of dehiscence and pelvic washout (05/20/17).  The patient continues to have abdominal pain but improved.  He does remains anorexic and has heart burn.  He still has occasional chills without a fever.  His BP has been well controlled.  Current Outpatient Medications  Medication Sig Dispense Refill  . ciprofloxacin (CIPRO) 500 MG tablet Take 1 tablet (500 mg total) by mouth 2 (two) times daily. 12 tablet 0  . lisinopril-hydrochlorothiazide (PRINZIDE,ZESTORETIC) 10-12.5 MG tablet Take 1 tablet by mouth daily. 90 tablet 3  . metroNIDAZOLE (FLAGYL) 500 MG tablet Take 1 tablet (500 mg total) by mouth every 8 (eight) hours. 24 tablet 0  . oxyCODONE-acetaminophen (PERCOCET/ROXICET) 5-325 MG tablet Take 1 tablet by mouth every 6 (six) hours as needed for moderate pain. 30 tablet 0  . rivaroxaban (XARELTO) 20 MG TABS tablet Take 1 tablet (20 mg total) by mouth daily with supper. 30 tablet 11  . Rivaroxaban 15 & 20 MG TBPK Take as directed on package: Start with one 15mg  tablet by mouth twice a day with food. On Day 22, switch to one 20mg  tablet once a day with food. 51 each 0   No current facility-administered medications for this visit.      For VQI Use Only   PRE-ADM LIVING: Home  AMB STATUS: Ambulatory   Physical Examination   Vitals:   06/04/17 1554 06/04/17 1557  BP: (!) 89/66 96/63  Pulse: (!) 109   Resp: 20   Temp: 97.8 F (36.6 C)   TempSrc: Oral   Weight: 192 lb (87.1 kg)   Height: 5\' 6"  (1.676 m)     Gastrointestinal: soft, appropriately tender, mildly distended (improved), -G/R, - HSM, - masses, - CVAT B, staples in place    Medical Decision Making   Marcus Pruitt is a 46 y.o. male who presents s/p TE SMA, open resection of SMA dx flap, BPA SMA; s/p Repair of abdominal  wound dehiscence, evacuation of intra-abdominal hematoma   The patient reports his home BP is usually in the 100-120 range.  Will have pt monitor his BP closely.  He may need to back off the lisinopril-HCTZ.  The patient will follow up with [place] in 3 months with CTA abdomen and pelvis studies.  He was instructed to complete his discharge oral antibiotics.    My PA gave him a refill of percocet 5/325 mg 1 PO Q6 prn for pain #30 no refills  Thank you for allowing [place] to participate in this patient's care.  Adele Barthel, MD, FACS [facility] Office: [phone] Pager: [phone]  The patient was seen in conjunction with [name] PA-C today

## 2017-06-04 ENCOUNTER — Encounter: Payer: Self-pay | Admitting: Vascular Surgery

## 2017-06-04 ENCOUNTER — Ambulatory Visit (INDEPENDENT_AMBULATORY_CARE_PROVIDER_SITE_OTHER): Payer: 59 | Admitting: Vascular Surgery

## 2017-06-04 VITALS — BP 96/63 | HR 109 | Temp 97.8°F | Resp 20 | Ht 66.0 in | Wt 192.0 lb

## 2017-06-04 DIAGNOSIS — K55059 Acute (reversible) ischemia of intestine, part and extent unspecified: Secondary | ICD-10-CM

## 2017-06-04 DIAGNOSIS — I771 Stricture of artery: Secondary | ICD-10-CM

## 2017-06-04 DIAGNOSIS — K551 Chronic vascular disorders of intestine: Secondary | ICD-10-CM

## 2017-06-04 MED ORDER — OXYCODONE-ACETAMINOPHEN 5-325 MG PO TABS: 1.0000 | ORAL_TABLET | Freq: Four times a day (QID) | ORAL | 0 refills | Status: DC | PRN

## 2017-06-09 ENCOUNTER — Ambulatory Visit: Payer: 59 | Admitting: Internal Medicine

## 2017-06-09 ENCOUNTER — Encounter: Payer: Self-pay | Admitting: Internal Medicine

## 2017-06-09 ENCOUNTER — Ambulatory Visit: Payer: Self-pay

## 2017-06-09 VITALS — BP 92/52 | HR 85 | Temp 97.7°F | Ht 66.0 in | Wt 195.2 lb

## 2017-06-09 DIAGNOSIS — K55059 Acute (reversible) ischemia of intestine, part and extent unspecified: Secondary | ICD-10-CM

## 2017-06-09 DIAGNOSIS — I10 Essential (primary) hypertension: Secondary | ICD-10-CM

## 2017-06-09 DIAGNOSIS — I7770 Dissection of unspecified artery: Secondary | ICD-10-CM

## 2017-06-09 LAB — COMPREHENSIVE METABOLIC PANEL
ALT: 22 U/L (ref 0–53)
AST: 18 U/L (ref 0–37)
Albumin: 3.7 g/dL (ref 3.5–5.2)
Alkaline Phosphatase: 109 U/L (ref 39–117)
BUN: 12 mg/dL (ref 6–23)
CO2: 26 mEq/L (ref 19–32)
Calcium: 8.7 mg/dL (ref 8.4–10.5)
Chloride: 102 mEq/L (ref 96–112)
Creatinine, Ser: 0.94 mg/dL (ref 0.40–1.50)
GFR: 110.68 mL/min (ref >60.00)
Glucose, Bld: 88 mg/dL (ref 70–99)
Potassium: 4 mEq/L (ref 3.5–5.1)
Sodium: 135 mEq/L (ref 135–145)
Total Bilirubin: 0.3 mg/dL (ref 0.2–1.2)
Total Protein: 6.8 g/dL (ref 6.0–8.3)

## 2017-06-09 LAB — CBC WITH DIFFERENTIAL/PLATELET
Basophils Absolute: 0 10*3/uL (ref 0.0–0.1)
Basophils Relative: 0.6 % (ref 0.0–3.0)
EOS ABS: 0.4 10*3/uL (ref 0.0–0.7)
Eosinophils Relative: 6.7 % — ABNORMAL HIGH (ref 0.0–5.0)
HCT: 32.1 % — ABNORMAL LOW (ref 39.0–52.0)
Hemoglobin: 10.2 g/dL — ABNORMAL LOW (ref 13.0–17.0)
LYMPHS ABS: 1.3 10*3/uL (ref 0.7–4.0)
Lymphocytes Relative: 20.2 % (ref 12.0–46.0)
MCHC: 31.9 g/dL (ref 30.0–36.0)
MCV: 84.7 fl (ref 78.0–100.0)
MONO ABS: 0.4 10*3/uL (ref 0.1–1.0)
Monocytes Relative: 6.3 % (ref 3.0–12.0)
NEUTROS ABS: 4.1 10*3/uL (ref 1.4–7.7)
NEUTROS PCT: 66.2 % (ref 43.0–77.0)
Platelets: 421 10*3/uL — ABNORMAL HIGH (ref 150.0–400.0)
RBC: 3.79 Mil/uL — ABNORMAL LOW (ref 4.22–5.81)
RDW: 15 % (ref 11.5–15.5)
WBC: 6.2 10*3/uL (ref 4.0–10.5)

## 2017-06-09 NOTE — Patient Instructions (Signed)
Advance diet as tolerated  Discontinue lisinopril hydrochlorothiazide  Okay to continue omeprazole 20 mg daily  Return in 2 weeks for follow-up or sooner if there is any clinical change

## 2017-06-09 NOTE — Telephone Encounter (Signed)
Pt called for decreased BP and diarrhea. Pt denies dizziness. Pt started a BRAT diet yesterday and stools have decreased in frequency. Pt was on Flagyl and Cipro until last Friday. Pt was admitted for surgery (removal of mesenteric artery clot) toward the end of November and was having diarrhea while in the hospital. Pt states he was checked for C-diff the day of discharge and it was negative. BP today 99/53 and 93/61 with HR 88. Pt states he is urinating normal frequency. Describes stools as watery with pieces of stool that occur shortly after eating. Appt made with Dr Burnice Logan today @ 1115. Reason for Disposition . [5] Fall in systolic BP > 20 mm Hg from normal AND [2] NOT dizzy, lightheaded, or weak . [1] Recent antibiotic therapy (i.e., within last 2 months) AND [2] > 3 days since antibiotic was stopped  Answer Assessment - Initial Assessment Questions 1. BLOOD PRESSURE: "What is the blood pressure?" "Did you take at least two measurements 5 minutes apart?"     99/53 and 93/61 2. ONSET: "When did you take your blood pressure?"     0600 3. HOW: "How did you obtain the blood pressure?" (e.g., visiting nurse, automatic home BP monitor)     Automatic BP monitor 4. HISTORY: "Do you have a history of low blood pressure?" "What is your blood pressure normally?"     No Pt has a h/o of HTN. Stopped Lisinopril- HCTZ Friday 06/04/17 BP usually runs 366-440 systolic; Diastolic runs 95 5. MEDICATIONS: "Are you taking any medications for blood pressure?" If yes: "Have they been changed recently?"     No Stopped 06/04/17 6. PULSE RATE: "Do you know what your pulse rate is?"      HR 88 7. OTHER SYMPTOMS: "Have you been sick recently?" "Have you had a recent injury?"     Just recently got out of the hospital from having surgery. Was having diarrhea in the hospital and the day of discharge pt was checked and was negative for C-diff 8. PREGNANCY: "Is there any chance you are pregnant?" "When was your last  menstrual period?"     n/a  Answer Assessment - Initial Assessment Questions 1. DIARRHEA SEVERITY: "How bad is the diarrhea?" "How many extra stools have you had in the past 24 hours than normal?"    - MILD: Few loose or mushy BMs; increase of 1-3 stools over normal daily number of stools; mild increase in ostomy output.   - MODERATE: Increase of 4-6 stools daily over normal; moderate increase in ostomy output.   - SEVERE (or Worst Possible): Increase of 7 or more stools daily over normal; moderate increase in ostomy output; incontinence.     Pt states because he is not eating his stools decreased down to 2 in a 24 hour period When you were eating 4-6 in a 24 hour period. Pt states every time he would eat would have diarrhea within 30 minutes to an hour.  Has been on BRAT diet started yesterday am and diarrhea has diminished. 2. ONSET: "When did the diarrhea begin?"      While pt was in the hospital toward the end of November 3. BM CONSISTENCY: "How loose or watery is the diarrhea?"      "very watery- can feels chunks of stool but water is mainly coming out" 4. VOMITING: "Are you also vomiting?" If so, ask: "How many times in the past 24 hours?"      No vomiting 5. ABDOMINAL PAIN: "Are you having  any abdominal pain?" If yes: "What does it feel like?" (e.g., crampy, dull, intermittent, constant)      No just from the surgery itself 6. ABDOMINAL PAIN SEVERITY: If present, ask: "How bad is the pain?"  (e.g., Scale 1-10; mild, moderate, or severe)    - MILD (1-3): doesn't interfere with normal activities, abdomen soft and not tender to touch     - MODERATE (4-7): interferes with normal activities or awakens from sleep, tender to touch     - SEVERE (8-10): excruciating pain, doubled over, unable to do any normal activities       Surgical pain per pt 6-7 out of ten took last pain pill 0600. 7. ORAL INTAKE: If vomiting, "Have you been able to drink liquids?" "How much fluids have you had in the past  24 hours?"    Pt is not vomiting Pt states he is drinking well 8. HYDRATION: "Any signs of dehydration?" (e.g., dry mouth [not just dry lips], too weak to stand, dizziness, new weight loss) "When did you last urinate?"     No dry mouth, not too weak to stand, no dizziness, was 220 lbs on admission and today is 192 lbs.  Last urinated this am  9. EXPOSURE: "Have you traveled to a foreign country recently?" "Have you been exposed to anyone with diarrhea?" "Could you have eaten any food that was spoiled?"     No travel or exposure no spoiled food 10. OTHER SYMPTOMS: "Do you have any other symptoms?" (e.g., fever, blood in stool)       Does wake up in the am with neck sweaty but doesn't feel hot anywhere else 11. PREGNANCY: "Is there any chance you are pregnant?" "When was your last menstrual period?"       n/a  Protocols used: LOW BLOOD PRESSURE-A-AH, DIARRHEA-A-AH

## 2017-06-09 NOTE — Progress Notes (Signed)
46  Subjective:    Patient ID: Marcus Pruitt, male    DOB: [birth year], 46 y.o.   MRN: [id number]  HPI  Wt Readings from Last 3 Encounters:  06/09/17 195 lb 3.2 oz (88.5 kg)  06/04/17 192 lb (87.1 kg)  05/28/17 204 lb 12.8 oz (92.9 kg)   Hospital Course:  The patient was admitted to the hospital and taken to the operating room on 05/20/2017 and underwent: 1. Rightcommon femoral arterycannulation under ultrasound guidance 2. Placement of catheter in aorta 3. Aortogram 4. Thrombectomy ofsuperior mesenteric artery 5. Openresection ofsuperior mesenteric arterydissectionflap 6. Bovine patch angioplastysuperior mesenteric artery 7. Selection ofsuperior mesenteric artery 8. Superior mesenteric arteryangiogram  46 year old patient who is seen following a recent hospital discharge approximately 2 weeks ago.  He was admitted for evaluation of abdominal pain and is status post surgery for thrombectomy of the SMA.  Postoperatively he had a wound dehiscence and required additional surgery.  He was discharged on Cipro/Flagyl which she completed 4 and 5 days ago. He continues to have loose stools which she has controlled nicely with a low residue diet.  He now is having one single loose bowel movement daily. Antihypertensive medication was discontinued 4-5 days ago due to hypotension.  He continues to have some abdominal discomfort which is mild and seems to be improving his appetite is excellent and he is tolerating present diet.  He still has some incisional tenderness however no nausea or vomiting  He has lost 20 pounds since the onset of his acute illness.  He has started taking a probiotic as well as omeprazole recently.  Past Medical History:  Diagnosis Date  . Allergy   . Cancer ([place])   . Hearing loss in right ear   . Hypertension   . Knee pain   . Low back pain   . Sleep apnea      Social History   Socioeconomic History  . Marital status: Married    Spouse name: Not on  file  . Number of children: Not on file  . Years of education: Not on file  . Highest education level: Not on file  Social Needs  . Financial resource strain: Not on file  . Food insecurity - worry: Not on file  . Food insecurity - inability: Not on file  . Transportation needs - medical: Not on file  . Transportation needs - non-medical: Not on file  Occupational History  . Not on file  Tobacco Use  . Smoking status: Former Smoker    Last attempt to quit: 01/28/2007    Years since quitting: 10.3  . Smokeless tobacco: Never Used  Substance and Sexual Activity  . Alcohol use: Yes    Alcohol/week: 0.0 oz    Comment: 2 glasses liquor week  . Drug use: No  . Sexual activity: Not on file  Other Topics Concern  . Not on file  Social History Narrative  . Not on file    Past Surgical History:  Procedure Laterality Date  . ABDOMINAL WOUND DEHISCENCE N/A 05/20/2017   Procedure: REPAIR OF ABDOMINAL WOUND DEHISCENCE AND EVACUATION OF HEMATOMA;  Surgeon: Angelia Mould, MD;  Location: [facility];  Service: Vascular;  Laterality: N/A;  . AORTOGRAM N/A 05/15/2017   Procedure: MESENTERIC AORTOGRAM;  Surgeon: Conrad Biddeford, MD;  Location: [facility];  Service: Vascular;  Laterality: N/A;  . INGUINAL HERNIA REPAIR Left 02/21/2016   Procedure: LAPAROSCOPIC INGUINAL HERNIA;  Surgeon: Alexis Frock, MD;  Location: [facility];  Service:  Urology;  Laterality: Left;  . LYMPHADENECTOMY Bilateral 02/21/2016   Procedure: PELVIC LYMPHADENECTOMY;  Surgeon: Alexis Frock, MD;  Location: [facility];  Service: Urology;  Laterality: Bilateral;  . MANDIBLE SURGERY  1990  . MESENTERIC ARTERY BYPASS N/A 05/15/2017   Procedure: OPEN FENESTRATION SUPERIOR MESENTERIC ARTERY;  Surgeon: Conrad Lancaster, MD;  Location[facility];  Service: Vascular;  Laterality: N/A;  . PATCH ANGIOPLASTY N/A 05/15/2017   Procedure: SUPERIOR MESENTERIC ARTERY PATCH ANGIOPLASTY USING PERI-GUARD PATCH;  Surgeon: Conrad [place], MD;  Location: [facility] [facility];   Service: Vascular;  Laterality: N/A;  . PERCUTANEOUS VENOUS THROMBECTOMY,LYSIS WITH INTRAVASCULAR ULTRASOUND (IVUS) N/A 05/15/2017   Procedure: THROMBECTOMY OF SUPERIOR MESENTERIC ARTERY;  Surgeon: Conrad Ooltewah, MD;  Location: [facility];  Service: Vascular;  Laterality: N/A;  . ROBOT ASSISTED LAPAROSCOPIC RADICAL PROSTATECTOMY N/A 02/21/2016   Procedure: XI ROBOTIC [place];  Surgeon: Alexis Frock, MD;  Location: [facility];  Service: Urology;  Laterality: N/A;  . UMBILICAL HERNIA REPAIR  02/21/2016   Procedure: LAPAROSCOPIC UMBILICAL HERNIA;  Surgeon: Alexis Frock, MD;  Location: [facility];  Service: Urology;;    Family History  Problem Relation Age of Onset  . Heart disease Maternal Grandmother   . Diabetes Unknown        fhx  . Hypertension Unknown        fhx  . Cancer Unknown        fhx/prostate  . Prostate cancer Father     No Known Allergies  Current Outpatient Medications on File Prior to Visit  Medication Sig Dispense Refill  . rivaroxaban (XARELTO) 20 MG TABS tablet Take 1 tablet (20 mg total) by mouth daily with supper. 30 tablet 11   No current facility-administered medications on file prior to visit.     BP (!) 92/52 (BP Location: Left Arm, Patient Position: Sitting, Cuff Size: Normal)   Pulse 85   Temp 97.7 F (36.5 C) (Oral)   Ht 5\' 6"  (1.676 m)   Wt 195 lb 3.2 oz (88.5 kg)   SpO2 99%   BMI 31.51 kg/m        Review of Systems  Constitutional: Positive for activity change and fatigue. Negative for appetite change, chills and fever.  HENT: Negative for congestion, dental problem, ear pain, hearing loss, sore throat, tinnitus, trouble swallowing and voice change.   Eyes: Negative for pain, discharge and visual disturbance.  Respiratory: Negative for cough, chest tightness, wheezing and stridor.   Cardiovascular: Negative for chest pain, palpitations and leg swelling.  Gastrointestinal: Positive for abdominal pain and diarrhea.  Negative for abdominal distention, blood in stool, constipation, nausea and vomiting.  Genitourinary: Negative for difficulty urinating, discharge, flank pain, genital sores, hematuria and urgency.  Musculoskeletal: Negative for arthralgias, back pain, gait problem, joint swelling, myalgias and neck stiffness.  Skin: Negative for rash.  Neurological: Negative for dizziness, syncope, speech difficulty, weakness, numbness and headaches.  Hematological: Negative for adenopathy. Does not bruise/bleed easily.  Psychiatric/Behavioral: Negative for behavioral problems and dysphoric mood. The patient is not nervous/anxious.        Objective:   Physical Exam  Constitutional: He is oriented to person, place, and time. He appears well-developed.  Blood pressure 100/64   HENT:  Head: Normocephalic.  Right Ear: External ear normal.  Left Ear: External ear normal.  Eyes: Conjunctivae and EOM are normal.  Neck: Normal range of motion.  Cardiovascular: Normal rate and normal heart sounds.  Pulmonary/Chest: Breath sounds normal.  Abdominal: Bowel  sounds are normal.  Midline surgical scar healing nicely No distention Mild tenderness without rebound Bowel sounds active  Musculoskeletal: Normal range of motion. He exhibits no edema or tenderness.  Neurological: He is alert and oriented to person, place, and time.  Psychiatric: He has a normal mood and affect. His behavior is normal.          Assessment & Plan:   Status post vascular surgery for SMA occlusion.  Continue anticoagulation Postoperative diarrhea.  This has been well controlled on a low residue diet we will continue probiotics.  Advance diet as tolerated History of hypertension.  Antihypertensive medications have been discontinued  Will check updated lab Follow-up next week with PCP  Vascular surgery follow-up as scheduled  Nyoka Cowden

## 2017-06-10 NOTE — Addendum Note (Signed)
Addended by: Lianne Cure A on: 06/10/2017 04:51 PM   Modules accepted: Orders

## 2017-06-16 ENCOUNTER — Encounter: Payer: Self-pay | Admitting: Internal Medicine

## 2017-06-17 ENCOUNTER — Ambulatory Visit: Payer: Self-pay

## 2017-06-17 NOTE — Telephone Encounter (Signed)
Patient called in to question whether he needs to start back taking his BP med, which was stopped by his surgeon at his post op visit on 06/04/17 because his BP was low at around 88/64 as stated by patient, also stated at that time he was feeling light headed. He said the surgeon told him to stop it for now and if his BP started trending back up to make an appointment with his primary care provider to evaluate, appointment was made at the earliest available time with Dr. Sarajane Jews per protocol, which is who the patient preferred to see, on 06/23/17, see assessment below. Instructed patient to continue taking his BP and record the readings to show the provider, instructed to call back if BP got worse according to protocol >160/100 or for any symptoms, verbalized understanding.    Reason for Disposition . [1] Systolic BP  >= 610 OR Diastolic >= 90 AND [9] not taking BP medications  Answer Assessment - Initial Assessment Questions 1. BLOOD PRESSURE: "What is the blood pressure?" "Did you take at least two measurements 5 minutes apart?"     5a-6a BP 138/84; 1125am 146/101; 1129 134/96, yesterday 125/79, 2. ONSET: "When did you take your blood pressure?"     Just now and earlier this morning, plus yesterday 3. HOW: "How did you obtain the blood pressure?" (e.g., visiting nurse, automatic home BP monitor)    Automatic home BP monitor 4. HISTORY: "Do you have a history of high blood pressure?"     Yes 5. MEDICATIONS: "Are you taking any medications for blood pressure?" "Have you missed any doses recently?"     None-taken off on December 7 by surgeon, BP was 88/64 and feeling "light headed at that time", post operative visit. 6. OTHER SYMPTOMS: "Do you have any symptoms?" (e.g., headache, chest pain, blurred vision, difficulty breathing, weakness)     Denies at present 7. PREGNANCY: "Is there any chance you are pregnant?" "When was your last menstrual period?"    N/A  Protocols used: HIGH BLOOD  PRESSURE-A-AH

## 2017-06-17 NOTE — Telephone Encounter (Signed)
Pt has been scheduled for 06/23/17 with Dr. Sarajane Jews.   Dr. Sarajane Jews - any further recommendations until then? Thanks!

## 2017-06-18 ENCOUNTER — Encounter: Payer: Self-pay | Admitting: Family Medicine

## 2017-06-18 ENCOUNTER — Telehealth: Payer: Self-pay | Admitting: Family Medicine

## 2017-06-18 NOTE — Telephone Encounter (Signed)
Pt. Called to ask if he could take Imodium for diarrhea. Instructed there should not be any interference with other medications.

## 2017-06-18 NOTE — Telephone Encounter (Signed)
Pt advised. He is keeping a log of his BP and will bring with him.

## 2017-06-18 NOTE — Telephone Encounter (Signed)
Have him continue to monitor this until the OV next week

## 2017-06-23 ENCOUNTER — Ambulatory Visit: Payer: 59 | Admitting: Family Medicine

## 2017-06-23 ENCOUNTER — Encounter: Payer: Self-pay | Admitting: Family Medicine

## 2017-06-23 VITALS — BP 138/98 | HR 82 | Temp 98.7°F | Wt 193.0 lb

## 2017-06-23 DIAGNOSIS — R197 Diarrhea, unspecified: Secondary | ICD-10-CM | POA: Diagnosis not present

## 2017-06-23 MED ORDER — DIPHENOXYLATE-ATROPINE 2.5-0.025 MG PO TABS: 2.0000 | ORAL_TABLET | Freq: Four times a day (QID) | ORAL | 1 refills | Status: DC | PRN

## 2017-06-23 NOTE — Progress Notes (Signed)
   Subjective:    Patient ID: Marcus Pruitt, male    DOB: 1970/07/22, 46 y.o.   MRN: [id number]  HPI Here for continued diarrhea. 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 He was seen here on 06-09-17 and had labs done. His WBC was normal, electrolytes were normal, and he was negative for C diff. He was advised to take a probiotic, which he has done with modest improvement. He is drinking water and following a BRAT diet. He has mild abdominal cramps but no nausea or fever. He has been using Imodium. The diarrhea has slowed down from 5-6 loose stools a day to just one. He stopped taking Omeprazole 2 weeks ago. His only current medication is Xarelto.    Review of Systems  Constitutional: Negative.   Respiratory: Negative.   Cardiovascular: Negative.   Gastrointestinal: Positive for diarrhea. Negative for abdominal distention, abdominal pain, anal bleeding, blood in stool, constipation, nausea, rectal pain and vomiting.  Genitourinary: Negative.        Objective:   Physical Exam  Constitutional: He is oriented to person, place, and time. He appears well-developed and well-nourished.  Cardiovascular: Normal rate, regular rhythm, normal heart sounds and intact distal pulses.  Pulmonary/Chest: Effort normal and breath sounds normal. No respiratory distress. He has no wheezes. He has no rales.  Abdominal: Soft. Bowel sounds are normal. He exhibits no distension and no mass. There is no rebound and no guarding.  Mild incisional tenderness only   Neurological: He is alert and oriented to person, place, and time.          Assessment & Plan:  Persistent diarrhea. He will try Lomotil and try to slowly advance the diet. If this is not successful we may refer him to see GI.  Alysia Penna, MD

## 2017-07-01 ENCOUNTER — Encounter: Payer: Self-pay | Admitting: Internal Medicine

## 2017-07-01 ENCOUNTER — Telehealth: Payer: Self-pay | Admitting: Family Medicine

## 2017-07-01 DIAGNOSIS — R197 Diarrhea, unspecified: Secondary | ICD-10-CM

## 2017-07-01 NOTE — Telephone Encounter (Signed)
Sent to PCP ?

## 2017-07-01 NOTE — Telephone Encounter (Signed)
I referred him to GI as we discussed. He can use Lomotil in the meantime

## 2017-07-01 NOTE — Telephone Encounter (Signed)
Called and spoke with pt. Pt advised and voiced understanding.  

## 2017-07-01 NOTE — Telephone Encounter (Signed)
Copied from [place] [id number]. Topic: Quick Communication - See Telephone Encounter >> Jul 01, 2017 10:37 AM Ivar Drape wrote: CRM for notification. See Telephone encounter for:  07/01/17. Patient came in with Diarrhea issues on 06/23/17, but he is still having the same problem.  The medication that was prescribed slowed it down but he still can't eat anything.  Please advise.  [phone]

## 2017-07-03 ENCOUNTER — Encounter: Payer: Self-pay | Admitting: Vascular Surgery

## 2017-07-04 ENCOUNTER — Encounter: Payer: Self-pay | Admitting: Family Medicine

## 2017-07-05 ENCOUNTER — Ambulatory Visit: Payer: 59 | Admitting: Internal Medicine

## 2017-07-05 ENCOUNTER — Other Ambulatory Visit (INDEPENDENT_AMBULATORY_CARE_PROVIDER_SITE_OTHER): Payer: 59

## 2017-07-05 ENCOUNTER — Encounter: Payer: Self-pay | Admitting: Internal Medicine

## 2017-07-05 VITALS — BP 126/94 | HR 84 | Ht 66.0 in | Wt 188.2 lb

## 2017-07-05 DIAGNOSIS — D649 Anemia, unspecified: Secondary | ICD-10-CM

## 2017-07-05 DIAGNOSIS — K55069 Acute infarction of intestine, part and extent unspecified: Secondary | ICD-10-CM | POA: Diagnosis not present

## 2017-07-05 DIAGNOSIS — R197 Diarrhea, unspecified: Secondary | ICD-10-CM

## 2017-07-05 DIAGNOSIS — R1033 Periumbilical pain: Secondary | ICD-10-CM | POA: Diagnosis not present

## 2017-07-05 LAB — COMPREHENSIVE METABOLIC PANEL
ALK PHOS: 78 U/L (ref 39–117)
ALT: 37 U/L (ref 0–53)
AST: 23 U/L (ref 0–37)
Albumin: 4.5 g/dL (ref 3.5–5.2)
BILIRUBIN TOTAL: 0.5 mg/dL (ref 0.2–1.2)
BUN: 5 mg/dL — ABNORMAL LOW (ref 6–23)
CALCIUM: 9.5 mg/dL (ref 8.4–10.5)
CO2: 27 mEq/L (ref 19–32)
Chloride: 105 mEq/L (ref 96–112)
Creatinine, Ser: 0.98 mg/dL (ref 0.40–1.50)
GFR: 105.45 mL/min (ref >60.00)
Glucose, Bld: 91 mg/dL (ref 70–99)
Potassium: 3.9 mEq/L (ref 3.5–5.1)
Sodium: 140 mEq/L (ref 135–145)
Total Protein: 7.6 g/dL (ref 6.0–8.3)

## 2017-07-05 LAB — CBC WITH DIFFERENTIAL/PLATELET
BASOS ABS: 0 10*3/uL (ref 0.0–0.1)
Basophils Relative: 1 % (ref 0.0–3.0)
Eosinophils Absolute: 0.3 10*3/uL (ref 0.0–0.7)
Eosinophils Relative: 6.8 % — ABNORMAL HIGH (ref 0.0–5.0)
HEMATOCRIT: 39.1 % (ref 39.0–52.0)
HEMOGLOBIN: 12.4 g/dL — AB (ref 13.0–17.0)
LYMPHS PCT: 31.9 % (ref 12.0–46.0)
Lymphs Abs: 1.4 10*3/uL (ref 0.7–4.0)
MCHC: 31.7 g/dL (ref 30.0–36.0)
MCV: 82.8 fl (ref 78.0–100.0)
MONOS PCT: 6.8 % (ref 3.0–12.0)
Monocytes Absolute: 0.3 10*3/uL (ref 0.1–1.0)
Neutro Abs: 2.4 10*3/uL (ref 1.4–7.7)
Neutrophils Relative %: 53.5 % (ref 43.0–77.0)
Platelets: 304 10*3/uL (ref 150.0–400.0)
RBC: 4.72 Mil/uL (ref 4.22–5.81)
RDW: 15.9 % — ABNORMAL HIGH (ref 11.5–15.5)
WBC: 4.5 10*3/uL (ref 4.0–10.5)

## 2017-07-05 NOTE — Patient Instructions (Addendum)
  Your physician has requested that you go to the basement for the following lab work before leaving today: CBC/diff, CMET  You have been scheduled for a CT scan of the abdomen and pelvis at [facility] ([address]---this is in the same building as [facility]).   You are scheduled on 07/09/17 at [facility] should arrive 15 minutes prior to your appointment time for registration. Please follow the written instructions below on the day of your exam:  WARNING: IF YOU ARE ALLERGIC TO IODINE/X-RAY DYE, PLEASE NOTIFY RADIOLOGY IMMEDIATELY AT [phone]! YOU WILL BE GIVEN A 13 HOUR PREMEDICATION PREP.  1) Do not eat  anything after 5:00AM (4 hours prior to your test) but you may have liquids.  The purpose of you drinking the oral contrast is to aid in the visualization of your intestinal tract. The contrast solution may cause some diarrhea. Before your exam is started, you will be given a small amount of fluid to drink. Depending on your individual set of symptoms, you may also receive an intravenous injection of x-ray contrast/dye. Plan on being at [facility] for 30 minutes or longer, depending on the type of exam you are having performed.  This test typically takes 30-45 minutes to complete.  If you have any questions regarding your exam or if you need to reschedule, you may call the CT department at [phone] between the hours of 8:00 am and 5:00 pm, Monday-Friday.  _______________________________________________________________________ Please call Vein/Vascular and get them to cancel the CT Angio that is set up for March.  I appreciate the opportunity to care for you. Silvano Rusk, MD, [facility]

## 2017-07-05 NOTE — Progress Notes (Addendum)
Trayson Stitely 47 y.o. 17-Mar-1971 [id number] Referred by: Laurey Morale, MD  Assessment & Plan:   Encounter Diagnoses  Name Primary?  . Diarrhea, unspecified type Yes  . 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 Normocytic anemia   . Periumbilical abdominal pain    Because of his problems is not clear but started after his superior mesenteric vein thrombosis and I have concerns about persistent blood flow to the intestine problems versus damaged small bowel from the acute episode as a cause of his diarrhea.  He had C. difficile testing.  That was negative and the pattern of diarrhea is postprandial and does not sound infectious to me.  He can liberalize his diet some trying a low fiber diet if he wants.  Will perform CBC to follow-up this anemia, and also another set of chemistries and get CT angios abdomen and pelvis.  Depending upon those results he could need further imaging versus arteriography versus colonoscopy though given the sequence of events I am not suspicious of a colonic problem but it could need to be excluded.  Chronic anticoagulation makes that a little bit more difficult to do, would probably just hold the Xarelto the morning of if that was acceptable with the treating physician.  I suppose at some point he ought to  be worked up for hypercoagulable syndrome though right now is not a good time due to the recent thrombus and there is a certain amount of time that needs to labs before diagnostic workup is reliable I believe.  I appreciate the opportunity to care for this patient. CC: Laurey Morale, MD Dr. Adele Barthel  Addnendum from My Chart Message   See message from the patient about questions from yesterday's appt  ----- Message -----  From: Modena Slater  Sent: 07/06/2017 10:25 AM  To: [facility] Clinical Pool  Subject: Visit Follow-Up Question               ----- Message from [place], Generic sent at 07/06/2017 10:25 AM EST -----    I was asked about  blood in the stool and dark stool. I have experienced small amounts of both as I thought about it. But just sporadically.     Subjective:   Chief Complaint: Diarrhea and abdominal pain  HPI The patient is a very nice 47 year old African-American man who was doing reasonably well until May 15, 2019 developed acute abdominal pain and diarrhea when at work at [employer].  He went to the emergency department where a CT scan showed a superior mesenteric artery thrombus he had the following surgery on November 17 by Dr. Bridgett Larsson   1. Right common femoral artery cannulation under ultrasound guidance 2. Placement of catheter in aorta 3. Aortogram 4. Thrombectomy of superior mesenteric artery 5. 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 Bovine patch angioplasty superior mesenteric artery  7. Selection of superior mesenteric artery  8. Superior mesenteric artery angiogram   subsequently he had a wound dehiscence and got reoperated on on November 22 by Dr. Doren Custard  He had a evacuation of an intra-abdominal hematoma as well.   Since that time he has had diarrhea.  Watery stools 30-60 minutes after eating.  There is infraumbilical lower abdominal cramping.  Lomotil, brat diet, probiotics have not been helpful and C. difficile testing is been negative.  He was treated with Cipro and metronidazole as well.  He is never had problems like this before.  He has lost a significant  amount of weight.  He is not afraid to eat, i.e. does not have sitophobia.  He does think Pepto-Bismol helps somewhat.  He is maintained on Xarelto has not had a hypercoagulable workup yet.  He is not having any rectal bleeding or hematochezia.  Wt Readings from Last 3 Encounters:  07/05/17 188 lb 4 oz (85.4 kg)  06/23/17 193 lb (87.5 kg)  06/09/17 195 lb 3.2 oz (88.5 kg)    He says he was 220 pounds before this all started. He has never had to see a gastroenterologist before No Known  Allergies Current Meds  Medication Sig  . bismuth subsalicylate (PEPTO BISMOL) 262 MG/15ML suspension Take 30 mLs by mouth as needed.  . Probiotic Product (DIGESTIVE ADVANTAGE PO) Take 1 tablet by mouth daily.  . rivaroxaban (XARELTO) 20 MG TABS tablet Take 1 tablet (20 mg total) by mouth daily with supper.   Past Medical History:  Diagnosis Date  . Allergy   . H/O blood clots    mesenteric  . Hearing loss in right ear   . Hypertension   . Knee pain   . Low back pain   . Prostate cancer ([place])   . Sleep apnea   . 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 ABDOMINAL WOUND DEHISCENCE N/A 05/20/2017   Procedure: REPAIR OF ABDOMINAL WOUND DEHISCENCE AND EVACUATION OF HEMATOMA;  Surgeon: Angelia Mould, MD;  Location: [facility];  Service: Vascular;  Laterality: N/A;  . AORTOGRAM N/A 05/15/2017   Procedure: MESENTERIC AORTOGRAM;  Surgeon: Conrad [place], MD;  Location: [place] [facility] [place];  Service: Vascular;  Laterality: N/A;  . INGUINAL HERNIA REPAIR Left 02/21/2016   Procedure: LAPAROSCOPIC INGUINAL HERNIA;  Surgeon: Alexis Frock, MD;  Location: [facility];  Service: Urology;  Laterality: Left;  . LYMPHADENECTOMY Bilateral 02/21/2016   Procedure: PELVIC LYMPHADENECTOMY;  Surgeon: Alexis Frock, MD;  Location: [facility];  Service: Urology;  Laterality: Bilateral;  . MANDIBLE SURGERY  1990  . MESENTERIC ARTERY BYPASS N/A 05/15/2017   Procedure: OPEN FENESTRATION SUPERIOR MESENTERIC ARTERY;  Surgeon: Conrad [place], MD;  [facility];  Service: Vascular;  Laterality: N/A;  . PATCH ANGIOPLASTY N/A 05/15/2017   Procedure: SUPERIOR MESENTERIC ARTERY PATCH ANGIOPLASTY USING PERI-GUARD PATCH;  Surgeon: Conrad [place], MD;  Location: [facility];  Service: Vascular;  Laterality: N/A;  . PERCUTANEOUS VENOUS THROMBECTOMY,LYSIS WITH INTRAVASCULAR ULTRASOUND (IVUS) N/A 05/15/2017   Procedure: THROMBECTOMY OF SUPERIOR MESENTERIC ARTERY;  Surgeon: Conrad , MD;  Location: [facility];  Service: Vascular;  Laterality: N/A;  . ROBOT ASSISTED LAPAROSCOPIC RADICAL PROSTATECTOMY N/A 02/21/2016   Procedure: XI ROBOTIC [place];  Surgeon: Alexis Frock, MD;  Location: [facility];  Service: Urology;  Laterality: N/A;  . 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   Review of Systems  As per HPI all other review of systems are negative  Objective:   Physical Exam @BP  (!) 126/94 (BP Location: Left Arm, Patient Position: Sitting, Cuff Size: Normal)   Pulse 84   Ht 5\' 6"  (1.676 m) Comment: height measured without shoes  Wt 188 lb 4 oz (85.4 kg)   BMI 30.38 kg/m @  General:  Well-developed, well-nourished and in no acute distress Eyes:  anicteric. ENT:   Mouth and posterior pharynx free of lesions.  Neck:   supple w/o thyromegaly or mass.  Lungs: Clear to auscultation bilaterally. Heart:  S1S2, no rubs, murmurs, gallops. Abdomen:  soft, mildly tender upper abdomen, no hepatosplenomegaly, hernia, or mass and BS+. Lower Midline scar Lymph:  no cervical or supraclavicular adenopathy. Extremities:   no edema, cyanosis or clubbing Skin   no rash. Neuro:  A&O x 3.  Psych:  appropriate mood and  Affect.   Data Reviewed:  I reviewed hospital operative notes, CT scans including images labs primary care notes all since November  2018

## 2017-07-06 ENCOUNTER — Encounter: Payer: Self-pay | Admitting: Internal Medicine

## 2017-07-06 ENCOUNTER — Encounter: Payer: Self-pay | Admitting: Family Medicine

## 2017-07-06 NOTE — Telephone Encounter (Signed)
Yes he should get a flu shot

## 2017-07-06 NOTE — Telephone Encounter (Signed)
A mild elevation in the eosinophils usually means the patient is having allergy issues (usually upper respiratory). I do not think this relates to the symptoms he mentions

## 2017-07-09 ENCOUNTER — Ambulatory Visit (INDEPENDENT_AMBULATORY_CARE_PROVIDER_SITE_OTHER)
Admission: RE | Admit: 2017-07-09 | Discharge: 2017-07-09 | Disposition: A | Discharge status: Home / Self Care | Payer: 59 | Source: Ambulatory Visit | Attending: Internal Medicine | Admitting: Internal Medicine

## 2017-07-09 DIAGNOSIS — K55069 Acute infarction of intestine, part and extent unspecified: Secondary | ICD-10-CM | POA: Diagnosis not present

## 2017-07-09 DIAGNOSIS — R1900 Intra-abdominal and pelvic swelling, mass and lump, unspecified site: Secondary | ICD-10-CM | POA: Diagnosis not present

## 2017-07-09 MED ORDER — IOPAMIDOL (ISOVUE-300) INJECTION 61%
100.0000 mL | Freq: Once | INTRAVENOUS | Status: AC | PRN
  Administered 2017-07-09: 100 mL via INTRAVENOUS

## 2017-07-09 NOTE — Progress Notes (Signed)
Let him know this shows problems with blood vessel again - I have spoken to his vascular surgeon Dr. Bridgett Larsson who will be contacting him.  If he gets significant worsening of abdominal pain, bloody diarrhea, etc then he should go to ED.

## 2017-07-09 NOTE — Progress Notes (Signed)
Results discussed w/ Dr. Barbie Banner  I am ccing Dr. Bridgett Larsson for his input and have left a message about it at his office

## 2017-07-13 ENCOUNTER — Ambulatory Visit: Payer: 59 | Admitting: Family Medicine

## 2017-07-13 ENCOUNTER — Encounter: Payer: Self-pay | Admitting: Family Medicine

## 2017-07-13 ENCOUNTER — Encounter: Payer: Self-pay | Admitting: Vascular Surgery

## 2017-07-13 VITALS — BP 120/90 | HR 94 | Temp 98.1°F | Wt 182.0 lb

## 2017-07-13 DIAGNOSIS — J069 Acute upper respiratory infection, unspecified: Secondary | ICD-10-CM

## 2017-07-13 DIAGNOSIS — B9789 Other viral agents as the cause of diseases classified elsewhere: Secondary | ICD-10-CM | POA: Diagnosis not present

## 2017-07-13 MED ORDER — HYDROCODONE-HOMATROPINE 5-1.5 MG/5ML PO SYRP: 5.0000 mL | ORAL_SOLUTION | Freq: Three times a day (TID) | ORAL | 0 refills | Status: DC | PRN

## 2017-07-13 NOTE — Patient Instructions (Signed)
You have a viral infection which will last 2-3 weeks.  A viral infection resolved spontaneously with symptomatic therapy  Drink lots of water Tylenol 2 tabs twice daily when necessary  Hydromet 1/2-1 teaspoon at bedtime when necessary for cough

## 2017-07-13 NOTE — Progress Notes (Signed)
Marcus Pruitt is a 47 year old divorced male nonsmoker who comes in today for evaluation of a cough for 5 days  He states 5 days ago he developed a congestion a cough. No fever chills sputum production etc. etc. No history of any pulmonary problems. He quit smoking 11 years ago.  He brings in a stack of scans that he would like me to review. He had a mesenteric artery thrombosis in November 2018. Underwent vascular surgery by Dr. Bridgett Larsson. Etiology of this problem is unknown. He did have radical prostatectomy the robotic surgery couple years ago. He had a recent scan which showed abnormalities as above and he saw Dr. call Carlean Purl yesterday and GI. He's had 2-3 loose bowel movements a day which Dr. Katha Cabal attributes to the decreased blood flow to the small bowel from the clots.  BP 120/90 (BP Location: Left Arm, Patient Position: Sitting, Cuff Size: Normal)   Pulse 94   Temp 98.1 F (36.7 C) (Oral)   Wt 182 lb (82.6 kg)   BMI 29.38 kg/m  Examination of the HEENT was negative neck was supple thyroid is not enlarged no adenopathy lungs are clear to auscultation  #1 viral syndrome with cough treat symptomatically.......... explained this is a viral infection and antibiotics are not indicated.

## 2017-07-16 ENCOUNTER — Encounter: Payer: Self-pay | Admitting: Vascular Surgery

## 2017-07-16 ENCOUNTER — Ambulatory Visit (INDEPENDENT_AMBULATORY_CARE_PROVIDER_SITE_OTHER): Payer: 59 | Admitting: Vascular Surgery

## 2017-07-16 ENCOUNTER — Other Ambulatory Visit: Payer: Self-pay | Admitting: *Deleted

## 2017-07-16 ENCOUNTER — Encounter: Payer: Self-pay | Admitting: *Deleted

## 2017-07-16 VITALS — BP 135/82 | HR 89 | Temp 98.0°F | Resp 20 | Ht 66.0 in | Wt 181.5 lb

## 2017-07-16 DIAGNOSIS — I81 Portal vein thrombosis: Secondary | ICD-10-CM

## 2017-07-16 DIAGNOSIS — IMO0002 Reserved for concepts with insufficient information to code with codable children: Secondary | ICD-10-CM

## 2017-07-16 DIAGNOSIS — K55069 Acute infarction of intestine, part and extent unspecified: Secondary | ICD-10-CM

## 2017-07-16 NOTE — Progress Notes (Signed)
Post-operative Mesenteric Surgery   History of Present Illness   Marcus Pruitt is a 47 y.o. ([birth year]) male who presents post-op s/p TE SMA, open resection of SMA dissection flap, BPA SMA (05/15/17) and X-lap, repair of dehiscence and pelvic washout (05/20/17).  The patient continues to have abdominal pain but improved.  Her continues to have 2-3 loose BM over the last two months.  He has lost near 50 lb since his surgery.  Pt recently had a CTA completed.   Past Medical History:  Diagnosis Date  . Allergy   . H/O blood clots    mesenteric  . Hearing loss in right ear   . Hypertension   . Knee pain   . Low back pain   . Prostate cancer ([place])   . Sleep apnea   . 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 ABDOMINAL WOUND DEHISCENCE N/A 05/20/2017   Procedure: REPAIR OF ABDOMINAL WOUND DEHISCENCE AND EVACUATION OF HEMATOMA;  Surgeon: Angelia Mould, MD;  Location: [place];  Service: Vascular;  Laterality: N/A;  . AORTOGRAM N/A 05/15/2017   Procedure: MESENTERIC AORTOGRAM;  Surgeon: Conrad San [place]ipe, MD;  Location: [place];  Service: Vascular;  Laterality: N/A;  . INGUINAL HERNIA REPAIR Left 02/21/2016   Procedure: LAPAROSCOPIC INGUINAL HERNIA;  Surgeon: Alexis Frock, MD;  [facility];  Service: Urology;  Laterality: Left;  . LYMPHADENECTOMY Bilateral 02/21/2016   Procedure: PELVIC LYMPHADENECTOMY;  Surgeon: Alexis Frock, MD;  [facility];  Service: Urology;  Laterality: Bilateral;  . MANDIBLE SURGERY  1990  . MESENTERIC ARTERY BYPASS N/A 05/15/2017   Procedure: OPEN FENESTRATION SUPERIOR MESENTERIC ARTERY;  Surgeon: Conrad Nodaway, MD;  Location: [facility] OR;  Service: Vascular;  Laterality: N/A;  . PATCH ANGIOPLASTY N/A 05/15/2017   Procedure: SUPERIOR MESENTERIC ARTERY PATCH ANGIOPLASTY USING PERI-GUARD PATCH;  Surgeon: Conrad C[place]field, MD;  Location: [place];  Service: Vascular;  Laterality: N/A;  .  PERCUTANEOUS VENOUS THROMBECTOMY,LYSIS WITH INTRAVASCULAR ULTRASOUND (IVUS) N/A 05/15/2017   Procedure: THROMBECTOMY OF SUPERIOR MESENTERIC ARTERY;  Surgeon: Conrad [place], MD;  Location: [place];  Service: Vascular;  Laterality: N/A;  . ROBOT ASSISTED LAPAROSCOPIC RADICAL PROSTATECTOMY N/A 02/21/2016   Procedure: XI ROBOTIC [place];  Surgeon: Alexis Frock, MD;  [facility];  Service: Urology;  Laterality: N/A;  . UMBILICAL HERNIA REPAIR  02/21/2016   Procedure: LAPAROSCOPIC UMBILICAL HERNIA;  Surgeon: Alexis Frock, MD;  [facility];  Service: Urology;;    Social History   Socioeconomic History  . Marital status: Married    Spouse name: Not on file  . Number of children: 2  . Years of education: Not on file  . Highest education level: Not on file  Social Needs  . Financial resource strain: Not on file  . Food insecurity - worry: Not on file  . Food insecurity - inability: Not on file  . Transportation needs - medical: Not on file  . Transportation needs - non-medical: Not on file  Occupational History  . Occupation: Information systems manager  Tobacco Use  . Smoking status: Former Smoker    Last attempt to quit: 01/28/2007    Years since quitting: 10.4  . Smokeless tobacco: Never Used  Substance and Sexual Activity  . Alcohol use: Yes    Alcohol/week: 0.0 oz    Comment: occasional wine  . Drug use: No  . Sexual activity: Not on file  Other Topics Concern  . 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 Heart disease Maternal Grandmother   . Diabetes Unknown        fhx  . Prostate cancer Father   . Hypertension Mother   . Stroke Mother      Current Outpatient Medications  Medication Sig Dispense Refill  . bismuth subsalicylate (PEPTO BISMOL) 262 MG/15ML suspension Take 30 mLs by mouth as needed.    . Probiotic Product (DIGESTIVE ADVANTAGE PO) Take 1 tablet by mouth daily.    . rivaroxaban (XARELTO) 20 MG TABS tablet Take 1 tablet (20 mg total) by mouth daily with supper. 30 tablet 11  . HYDROcodone-homatropine (HYCODAN) 5-1.5 MG/5ML syrup Take 5 mLs by mouth every 8 (eight) hours as needed. (Patient not taking: Reported on 07/16/2017) 240 mL 0   No current facility-administered medications for this visit.      No Known Allergies  REVIEW OF SYSTEMS (negative unless checked):   Cardiac:  []  Chest pain or chest pressure? []  Shortness of breath upon activity? []  Shortness of breath when lying flat? []  Irregular heart rhythm?  Vascular:  []  Pain in calf, thigh, or hip brought on by walking? []  Pain in feet at night that wakes you up from your sleep? []  Blood clot in your veins? []  Leg swelling?  Pulmonary:  []  Oxygen at home? []  Productive cough? []  Wheezing?  Neurologic:  []  Sudden weakness in arms or legs? []  Sudden numbness in arms or legs? []  Sudden onset of difficult speaking or slurred speech? []  Temporary loss of vision in one eye? []  Problems with dizziness?  Gastrointestinal:  []  Blood in stool? []  Vomited blood? [x]  Intermittent pain? [x]  Chronic diarrhea?  Genitourinary:  []  Burning when urinating? []  Blood in urine?  Psychiatric:  []  Major depression  Hematologic:  []  Bleeding problems? []  Problems with blood clotting?  Dermatologic:  []  Rashes or ulcers?  Constitutional:  []  Fever or chills?  Ear/Nose/Throat:  []  Change in hearing? []  Nose bleeds? []  Sore throat?  Musculoskeletal:  []  Back pain? []  Joint pain? []  Muscle pain?   Physical Examination   Vitals:   07/16/17 0903  BP: 135/82  Pulse: 89  Resp: 20  Temp: 98 F (36.7 C)  TempSrc: Oral  SpO2: 100%    Weight: 181 lb 8 oz (82.3 kg)  Height: 5\' 6"  (1.676 m)   Pulmonary Sym exp, good air movt, CTAB, no rales, rhonchi, & wheezing  Cardiac RRR, Nl S1, S2, no Murmurs, rubs or gallops   Gastrointestinal: soft, NTND, -G/R, - HSM, - masses, - CVAT B  CTA Abd/Pelvis (07/09/17) VASCULAR  The proximal SMA at the location of the bypass graft is thrombosed. There is also a second thrombosed segment of the distal SMA. Portions of the SMA and its branches reconstitute via a network from the IMA, into the left colic artery, middle colic artery, and ileocolic artery.  Focal outpouching of the mid right renal artery suggests a small aneurysm. This can be seen with vasculitis. Given the history of SMA dissection, this is strongly suspected.  There is narrowing of  the superior mesenteric vein and splenic vein at the operative bed as described above.  NON-VASCULAR  The transverse and descending colon are decompressed, but there is stranding involving the wall and adjacent fat. Inflammatory change or early ischemic change cannot be excluded. Correlate clinically.  3.6 cm mass involving the pancreas which is most likely related to postoperative change and thrombosed SMA bypass graft. Follow-up imaging is recommended to ensure resolution of this finding.  There is a heterogeneous mass just deep to the anterior abdominal wall with bright foci. This may simply represent postoperative change, however a foreign body such as a retained surgical sponge cannot be excluded.   I reviewed this CTA, the proximal SMA appears occluded.  There is obvious collateralization via the CA and IMA.  Perfusion of pattern of intestines is impossible to determine from the CTA.  Unclear what the sub-xiphoid structures is but looks like arterial flow into the structure.     Medical Decision Making   Hanish Laraia is a 47 y.o. male who presents s/p SMA fenestration with BPA, X-lap with fascia repair for acute  mesenteric ischemia due to SMA dissection   Given the other findings in the abdomen, including areas not involved with the surgery, the concern for mycotic process vs vasculitis are raised.  Unfortunately, proximal SMA thrombosed despite attempts to salvage the SMA.  The secondary thrombosis is likely at the segment of dissection in the mid-distal SMA imaged on intra-op angio.  This was distal to the segment that could easily dissected out, as branching of the SMA was already occurring.  Essentially, intraoperatively this patient has an extensive SMA dissection extending past the jejunal branches and extending into distal segment.    Pt also thrombosed on full anticoagulation, subsequently, I suspect the SMA is NOT salvageable surgically.  To determine viability of intestines, I suspect endoscopy +/- capsule endoscopy might be needed.  Would interrogate perfusion of mesenteric with aortogram, SMA and IMA angiography as the first step.  If there are segments of ischemic bowel, patient may need to consider resection of such in the future. I discussed with the patient the nature of angiographic procedures, especially the limited patencies of any endovascular intervention.   The patient is aware of that the risks of an angiographic procedure include but are not limited to: bleeding, infection, access site complications, renal failure, embolization, rupture of vessel, dissection, arteriovenous fistula, possible need for emergent surgical intervention, possible need for surgical procedures to treat the patient's pathology, anaphylactic reaction to contrast, and stroke and death.   The patient is aware of the risks and agrees to proceed.  He is scheduled for this Thursday 24 JAN 19  Thank you for allowing [place] to participate in this patient's care.   Adele Barthel, MD, FACS [facility] Office: [phone] Pager: [phone]

## 2017-07-16 NOTE — H&P (View-Only) (Signed)
Post-operative Mesenteric Surgery   History of Present Illness   [employer]us P[employer]itt is a 47 y.o. (Oct 12, 1970) male who presents post-op s/p TE SMA, open resection of SMA dissection flap, BPA SMA (05/15/17) and X-lap, repair of dehiscence and pelvic washout (05/20/17).  The patient continues to have abdominal pain but improved.  Her continues to have 2-3 loose BM over the last two months.  He has lost near 50 lb since his surgery.  Pt recently had a CTA completed.   Past Medical History:  Diagnosis Date  . Allergy   . H/O blood clots    mesenteric  . Hearing loss in right ear   . Hypertension   . Knee pain   . Low back pain   . Prostate cancer ([place])   . Sleep apnea   . 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 ABDOMINAL WOUND DEHISCENCE N/A 05/20/2017   Procedure: REPAIR OF ABDOMINAL WOUND DEHISCENCE AND EVACUATION OF HEMATOMA;  Surgeon: Angelia Mould, MD;  Location: [place];  Service: Vascular;  Laterality: N/A;  . AORTOGRAM N/A 05/15/2017   Procedure: MESENTERIC AORTOGRAM;  Surgeon: Conrad Auglaize, MD;  Location: [place];  Service: Vascular;  Laterality: N/A;  . INGUINAL HERNIA REPAIR Left 02/21/2016   Procedure: LAPAROSCOPIC INGUINAL HERNIA;  Surgeon: Alexis Frock, MD;  [facility];  Service: Urology;  Laterality: Left;  . LYMPHADENECTOMY Bilateral 02/21/2016   Procedure: PELVIC LYMPHADENECTOMY;  Surgeon: Alexis Frock, MD;  [facility];  Service: Urology;  Laterality: Bilateral;  . MANDIBLE SURGERY  1990  . MESENTERIC ARTERY BYPASS N/A 05/15/2017   Procedure: OPEN FENESTRATION SUPERIOR MESENTERIC ARTERY;  Surgeon: Conrad [place], MD;  Location: [facility] OR;  Service: Vascular;  Laterality: N/A;  . PATCH ANGIOPLASTY N/A 05/15/2017   Procedure: SUPERIOR MESENTERIC ARTERY PATCH ANGIOPLASTY USING PERI-GUARD PATCH;  Surgeon: Conrad [place], MD;  Location: [place];  Service: Vascular;  Laterality: N/A;  .  PERCUTANEOUS VENOUS THROMBECTOMY,LYSIS WITH INTRAVASCULAR ULTRASOUND (IVUS) N/A 05/15/2017   Procedure: THROMBECTOMY OF SUPERIOR MESENTERIC ARTERY;  Surgeon: Conrad [place], MD;  Location: [place];  Service: Vascular;  Laterality: N/A;  . ROBOT ASSISTED LAPAROSCOPIC RADICAL PROSTATECTOMY N/A 02/21/2016   Procedure: XI ROBOTIC [place];  Surgeon: Alexis Frock, MD;  [facility];  Service: Urology;  Laterality: N/A;  . UMBILICAL HERNIA REPAIR  02/21/2016   Procedure: LAPAROSCOPIC UMBILICAL HERNIA;  Surgeon: Alexis Frock, MD;  [facility];  Service: Urology;;    Social History   Socioeconomic History  . Marital status: Married    Spouse name: Not on file  . Number of children: 2  . Years of education: Not on file  . Highest education level: Not on file  Social Needs  . Financial resource strain: Not on file  . Food insecurity - worry: Not on file  . Food insecurity - inability: Not on file  . Transportation needs - medical: Not on file  . Transportation needs - non-medical: Not on file  Occupational History  . Occupation: Information systems manager  Tobacco Use  . Smoking status: Former Smoker    Last attempt to quit: 01/28/2007    Years since quitting: 10.4  . Smokeless tobacco: Never Used  Substance and Sexual Activity  . Alcohol use: Yes    Alcohol/week: 0.0 oz    Comment: occasional wine  . Drug use: No  . Sexual activity: Not on file  Other Topics Concern  . 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 Heart disease Maternal Grandmother   . Diabetes Unknown        fhx  . Prostate cancer Father   . Hypertension Mother   . Stroke Mother      Current Outpatient Medications  Medication Sig Dispense Refill  . bismuth subsalicylate (PEPTO BISMOL) 262 MG/15ML suspension Take 30 mLs by mouth as needed.    . Probiotic Product (DIGESTIVE ADVANTAGE PO) Take 1 tablet by mouth daily.    . rivaroxaban (XARELTO) 20 MG TABS tablet Take 1 tablet (20 mg total) by mouth daily with supper. 30 tablet 11  . HYDROcodone-homatropine (HYCODAN) 5-1.5 MG/5ML syrup Take 5 mLs by mouth every 8 (eight) hours as needed. (Patient not taking: Reported on 07/16/2017) 240 mL 0   No current facility-administered medications for this visit.      No Known Allergies  REVIEW OF SYSTEMS (negative unless checked):   Cardiac:  []  Chest pain or chest pressure? []  Shortness of breath upon activity? []  Shortness of breath when lying flat? []  Irregular heart rhythm?  Vascular:  []  Pain in calf, thigh, or hip brought on by walking? []  Pain in feet at night that wakes you up from your sleep? []  Blood clot in your veins? []  Leg swelling?  Pulmonary:  []  Oxygen at home? []  Productive cough? []  Wheezing?  Neurologic:  []  Sudden weakness in arms or legs? []  Sudden numbness in arms or legs? []  Sudden onset of difficult speaking or slurred speech? []  Temporary loss of vision in one eye? []  Problems with dizziness?  Gastrointestinal:  []  Blood in stool? []  Vomited blood? [x]  Intermittent pain? [x]  Chronic diarrhea?  Genitourinary:  []  Burning when urinating? []  Blood in urine?  Psychiatric:  []  Major depression  Hematologic:  []  Bleeding problems? []  Problems with blood clotting?  Dermatologic:  []  Rashes or ulcers?  Constitutional:  []  Fever or chills?  Ear/Nose/Throat:  []  Change in hearing? []  Nose bleeds? []  Sore throat?  Musculoskeletal:  []  Back pain? []  Joint pain? []  Muscle pain?   Physical Examination   Vitals:   07/16/17 0903  BP: 135/82  Pulse: 89  Resp: 20  Temp: 98 F (36.7 C)  TempSrc: Oral  SpO2: 100%    Weight: 181 lb 8 oz (82.3 kg)  Height: 5\' 6"  (1.676 m)   Pulmonary Sym exp, good air movt, CTAB, no rales, rhonchi, & wheezing  Cardiac RRR, Nl S1, S2, no Murmurs, rubs or gallops   Gastrointestinal: soft, NTND, -G/R, - HSM, - masses, - CVAT B  CTA Abd/Pelvis (07/09/17) VASCULAR  The proximal SMA at the location of the bypass graft is thrombosed. There is also a second thrombosed segment of the distal SMA. Portions of the SMA and its branches reconstitute via a network from the IMA, into the left colic artery, middle colic artery, and ileocolic artery.  Focal outpouching of the mid right renal artery suggests a small aneurysm. This can be seen with vasculitis. Given the history of SMA dissection, this is strongly suspected.  There is narrowing of  the superior mesenteric vein and splenic vein at the operative bed as described above.  NON-VASCULAR  The transverse and descending colon are decompressed, but there is stranding involving the wall and adjacent fat. Inflammatory change or early ischemic change cannot be excluded. Correlate clinically.  3.6 cm mass involving the pancreas which is most likely related to postoperative change and thrombosed SMA bypass graft. Follow-up imaging is recommended to ensure resolution of this finding.  There is a heterogeneous mass just deep to the anterior abdominal wall with bright foci. This may simply represent postoperative change, however a foreign body such as a retained surgical sponge cannot be excluded.   I reviewed this CTA, the proximal SMA appears occluded.  There is obvious collateralization via the CA and IMA.  Perfusion of pattern of intestines is impossible to determine from the CTA.  Unclear what the sub-xiphoid structures is but looks like arterial flow into the structure.     Medical Decision Making   Jshon Ibe is a 47 y.o. male who presents s/p SMA fenestration with BPA, X-lap with fascia repair for acute  mesenteric ischemia due to SMA dissection   Given the other findings in the abdomen, including areas not involved with the surgery, the concern for mycotic process vs vasculitis are raised.  Unfortunately, proximal SMA thrombosed despite attempts to salvage the SMA.  The secondary thrombosis is likely at the segment of dissection in the mid-distal SMA imaged on intra-op angio.  This was distal to the segment that could easily dissected out, as branching of the SMA was already occurring.  Essentially, intraoperatively this patient has an extensive SMA dissection extending past the jejunal branches and extending into distal segment.    Pt also thrombosed on full anticoagulation, subsequently, I suspect the SMA is NOT salvageable surgically.  To determine viability of intestines, I suspect endoscopy +/- capsule endoscopy might be needed.  Would interrogate perfusion of mesenteric with aortogram, SMA and IMA angiography as the first step.  If there are segments of ischemic bowel, patient may need to consider resection of such in the future. I discussed with the patient the nature of angiographic procedures, especially the limited patencies of any endovascular intervention.   The patient is aware of that the risks of an angiographic procedure include but are not limited to: bleeding, infection, access site complications, renal failure, embolization, rupture of vessel, dissection, arteriovenous fistula, possible need for emergent surgical intervention, possible need for surgical procedures to treat the patient's pathology, anaphylactic reaction to contrast, and stroke and death.   The patient is aware of the risks and agrees to proceed.  He is scheduled for this Thursday 24 JAN 19  Thank you for allowing [place] to participate in this patient's care.   Adele Barthel, MD, FACS [facility] [place] Office: [phone] Pager: [phone]

## 2017-07-19 ENCOUNTER — Telehealth: Payer: Self-pay | Admitting: Family Medicine

## 2017-07-19 ENCOUNTER — Encounter: Payer: Self-pay | Admitting: Family Medicine

## 2017-07-19 NOTE — Telephone Encounter (Signed)
Mr. Carlisi phoned with concerns about his blood pressure. He had surgery on 11/17 and was told  By cardiology and PCP not to continue his B/P medication, Lisinopril zestoretic 10-12.5. His blood pressure readings have been somewhat elevated recently and he wants to know if he should start up the medication again.  B/p today 130/80 and 140/83 Yesterday 135/87 Saturday 144/92 and 185/87  Please advise.

## 2017-07-19 NOTE — Telephone Encounter (Signed)
[employer] - Please advise. Thanks!

## 2017-07-22 ENCOUNTER — Encounter: Payer: Self-pay | Admitting: *Deleted

## 2017-07-22 ENCOUNTER — Encounter (HOSPITAL_COMMUNITY)
Admission: RE | Disposition: A | Discharge status: Home / Self Care | Payer: Self-pay | Source: Ambulatory Visit | Attending: Vascular Surgery

## 2017-07-22 ENCOUNTER — Telehealth: Payer: Self-pay | Admitting: Vascular Surgery

## 2017-07-22 ENCOUNTER — Ambulatory Visit (HOSPITAL_COMMUNITY)
Admission: RE | Admit: 2017-07-22 | Discharge: 2017-07-22 | Disposition: A | Discharge status: Home / Self Care | Payer: 59 | Source: Ambulatory Visit | Attending: Vascular Surgery | Admitting: Vascular Surgery

## 2017-07-22 DIAGNOSIS — Z539 Procedure and treatment not carried out, unspecified reason: Secondary | ICD-10-CM | POA: Insufficient documentation

## 2017-07-22 DIAGNOSIS — I739 Peripheral vascular disease, unspecified: Secondary | ICD-10-CM | POA: Diagnosis present

## 2017-07-22 SURGERY — ABDOMINAL AORTOGRAM
Anesthesia: LOCAL

## 2017-07-22 MED ORDER — SODIUM CHLORIDE 0.9 % IV SOLN: INTRAVENOUS | Status: DC

## 2017-07-22 NOTE — Telephone Encounter (Signed)
He was seen in our clinic

## 2017-07-22 NOTE — Telephone Encounter (Signed)
-----   Message from Conrad Andrews, MD sent at 07/21/2017  9:41 PM EST ----- Regarding: RE: CTA Don't need CTA as already done.  ----- Message ----- From: Georgiann Mccoy Sent: 07/21/2017   4:05 PM To: [place], MD Subject: CTA                                            This pt was sched to return to our office for a CTA abd/pel + MD on 09/08/17.  However, Dr. Carlean Purl had the pt do the same CTA on 07/09/16 for a superior mesenteric artery thrombosis and canceled the CTA in March with [place].  Should we resch our CTA some months out or do you want to keep his MD appt with [place] in March and use the 07/09/17 CTA?

## 2017-07-22 NOTE — Telephone Encounter (Signed)
Spoke to pt to let them know we do not need to repeat the cta and we will see them 09/08/17.

## 2017-07-22 NOTE — Telephone Encounter (Signed)
Good. Stay on current regimen

## 2017-07-23 ENCOUNTER — Telehealth: Payer: Self-pay | Admitting: *Deleted

## 2017-07-23 ENCOUNTER — Other Ambulatory Visit: Payer: Self-pay | Admitting: *Deleted

## 2017-07-23 ENCOUNTER — Encounter: Payer: Self-pay | Admitting: *Deleted

## 2017-07-23 NOTE — Progress Notes (Signed)
Letter of detailed instructions faxed to patient at # given [phone]. To hold Xarelto x 2 days prior to procedure.

## 2017-07-23 NOTE — Telephone Encounter (Signed)
Patient confirmed he received instructions and had no questions.

## 2017-07-29 ENCOUNTER — Encounter: Payer: Self-pay | Admitting: Vascular Surgery

## 2017-07-29 ENCOUNTER — Encounter (HOSPITAL_COMMUNITY)
Admission: RE | Disposition: A | Discharge status: Home / Self Care | Payer: Self-pay | Source: Ambulatory Visit | Attending: Vascular Surgery

## 2017-07-29 ENCOUNTER — Ambulatory Visit (HOSPITAL_COMMUNITY)
Admission: RE | Admit: 2017-07-29 | Discharge: 2017-07-29 | Disposition: A | Discharge status: Home / Self Care | Payer: 59 | Source: Ambulatory Visit | Attending: Vascular Surgery | Admitting: Vascular Surgery

## 2017-07-29 DIAGNOSIS — Z7901 Long term (current) use of anticoagulants: Secondary | ICD-10-CM | POA: Diagnosis not present

## 2017-07-29 DIAGNOSIS — IMO0002 Reserved for concepts with insufficient information to code with codable children: Secondary | ICD-10-CM

## 2017-07-29 DIAGNOSIS — I10 Essential (primary) hypertension: Secondary | ICD-10-CM | POA: Diagnosis not present

## 2017-07-29 DIAGNOSIS — I7770 Dissection of unspecified artery: Secondary | ICD-10-CM | POA: Diagnosis not present

## 2017-07-29 DIAGNOSIS — Z8546 Personal history of malignant neoplasm of prostate: Secondary | ICD-10-CM | POA: Insufficient documentation

## 2017-07-29 DIAGNOSIS — K55069 Acute infarction of intestine, part and extent unspecified: Secondary | ICD-10-CM | POA: Diagnosis present

## 2017-07-29 DIAGNOSIS — I7779 Dissection of other artery: Secondary | ICD-10-CM | POA: Diagnosis not present

## 2017-07-29 DIAGNOSIS — G4730 Sleep apnea, unspecified: Secondary | ICD-10-CM | POA: Insufficient documentation

## 2017-07-29 DIAGNOSIS — Z87891 Personal history of nicotine dependence: Secondary | ICD-10-CM | POA: Diagnosis not present

## 2017-07-29 HISTORY — PX: VISCERAL ANGIOGRAM: SHX5515

## 2017-07-29 LAB — HEPATIC FUNCTION PANEL
ALBUMIN: 3.7 g/dL (ref 3.5–5.0)
ALK PHOS: 80 U/L (ref 38–126)
ALT: 37 U/L (ref 17–63)
AST: 24 U/L (ref 15–41)
BILIRUBIN TOTAL: 0.8 mg/dL (ref 0.3–1.2)
Bilirubin, Direct: 0.1 mg/dL (ref 0.1–0.5)
Indirect Bilirubin: 0.7 mg/dL (ref 0.3–0.9)
Total Protein: 6.4 g/dL — ABNORMAL LOW (ref 6.5–8.1)

## 2017-07-29 LAB — POCT I-STAT, CHEM 8
BUN: 5 mg/dL — ABNORMAL LOW (ref 6–20)
CALCIUM ION: 1.23 mmol/L (ref 1.15–1.40)
Chloride: 102 mmol/L (ref 101–111)
Creatinine, Ser: 0.9 mg/dL (ref 0.61–1.24)
GLUCOSE: 86 mg/dL (ref 65–99)
HCT: 40 % (ref 39.0–52.0)
HEMOGLOBIN: 13.6 g/dL (ref 13.0–17.0)
POTASSIUM: 3.4 mmol/L — AB (ref 3.5–5.1)
SODIUM: 143 mmol/L (ref 135–145)
TCO2: 28 mmol/L (ref 22–32)

## 2017-07-29 LAB — CBC
HCT: 37.4 % — ABNORMAL LOW (ref 39.0–52.0)
Hemoglobin: 12.1 g/dL — ABNORMAL LOW (ref 13.0–17.0)
MCH: 25.6 pg — AB (ref 26.0–34.0)
MCHC: 32.4 g/dL (ref 30.0–36.0)
MCV: 79.2 fL (ref 78.0–100.0)
PLATELETS: 254 10*3/uL (ref 150–400)
RBC: 4.72 MIL/uL (ref 4.22–5.81)
RDW: 14.8 % (ref 11.5–15.5)
WBC: 3.6 10*3/uL — ABNORMAL LOW (ref 4.0–10.5)

## 2017-07-29 LAB — C-REACTIVE PROTEIN: CRP: <0.8 mg/dL (ref <1.0)

## 2017-07-29 LAB — PREALBUMIN: Prealbumin: 25.3 mg/dL (ref 18–38)

## 2017-07-29 SURGERY — VISCERAL ANGIOGRAM

## 2017-07-29 MED ORDER — FENTANYL CITRATE (PF) 100 MCG/2ML IJ SOLN
INTRAMUSCULAR | Status: AC
  Filled 2017-07-29: qty 2

## 2017-07-29 MED ORDER — FENTANYL CITRATE (PF) 100 MCG/2ML IJ SOLN
INTRAMUSCULAR | Status: DC | PRN
  Administered 2017-07-29: 50 ug via INTRAVENOUS

## 2017-07-29 MED ORDER — LIDOCAINE HCL 1 % IJ SOLN
INTRAMUSCULAR | Status: AC
  Filled 2017-07-29: qty 20

## 2017-07-29 MED ORDER — HEPARIN (PORCINE) IN NACL 2-0.9 UNIT/ML-% IJ SOLN
INTRAMUSCULAR | Status: AC | PRN
  Administered 2017-07-29: 1000 mL

## 2017-07-29 MED ORDER — HYDRALAZINE HCL 20 MG/ML IJ SOLN: 5.0000 mg | INTRAMUSCULAR | Status: DC | PRN

## 2017-07-29 MED ORDER — MIDAZOLAM HCL 2 MG/2ML IJ SOLN
INTRAMUSCULAR | Status: AC
  Filled 2017-07-29: qty 2

## 2017-07-29 MED ORDER — MIDAZOLAM HCL 2 MG/2ML IJ SOLN
INTRAMUSCULAR | Status: DC | PRN
  Administered 2017-07-29: 1 mg via INTRAVENOUS

## 2017-07-29 MED ORDER — SODIUM CHLORIDE 0.9 % IV SOLN: 250.0000 mL | INTRAVENOUS | Status: DC | PRN

## 2017-07-29 MED ORDER — SODIUM CHLORIDE 0.9 % IV SOLN
INTRAVENOUS | Status: DC
  Administered 2017-07-29: 11:00:00 via INTRAVENOUS

## 2017-07-29 MED ORDER — IODIXANOL 320 MG/ML IV SOLN
INTRAVENOUS | Status: DC | PRN
  Administered 2017-07-29: 155 mL via INTRAVENOUS

## 2017-07-29 MED ORDER — MORPHINE SULFATE (PF) 10 MG/ML IV SOLN: 2.0000 mg | INTRAVENOUS | Status: DC | PRN

## 2017-07-29 MED ORDER — LABETALOL HCL 5 MG/ML IV SOLN
INTRAVENOUS | Status: AC
  Filled 2017-07-29: qty 4

## 2017-07-29 MED ORDER — HEPARIN (PORCINE) IN NACL 2-0.9 UNIT/ML-% IJ SOLN
INTRAMUSCULAR | Status: AC
  Filled 2017-07-29: qty 1000

## 2017-07-29 MED ORDER — SODIUM CHLORIDE 0.9% FLUSH: 3.0000 mL | INTRAVENOUS | Status: DC | PRN

## 2017-07-29 MED ORDER — LIDOCAINE HCL (PF) 1 % IJ SOLN
INTRAMUSCULAR | Status: DC | PRN
  Administered 2017-07-29: 20 mL

## 2017-07-29 MED ORDER — SODIUM CHLORIDE 0.9% FLUSH: 3.0000 mL | Freq: Two times a day (BID) | INTRAVENOUS | Status: DC

## 2017-07-29 MED ORDER — LABETALOL HCL 5 MG/ML IV SOLN
10.0000 mg | INTRAVENOUS | Status: DC | PRN
  Administered 2017-07-29: 10 mg via INTRAVENOUS

## 2017-07-29 MED ORDER — OXYCODONE HCL 5 MG PO TABS: 5.0000 mg | ORAL_TABLET | ORAL | Status: DC | PRN

## 2017-07-29 MED ORDER — SODIUM CHLORIDE 0.9 % WEIGHT BASED INFUSION: 1.0000 mL/kg/h | INTRAVENOUS | Status: DC

## 2017-07-29 SURGICAL SUPPLY — 16 items
CATH ANGIO 5F PIGTAIL 65CM (CATHETERS) ×6 IMPLANT
CATH BEACON 5 .035 65 C2 TIP (CATHETERS) ×3 IMPLANT
CATH BEACON 5 .035 65 VANSC4 (CATHETERS) ×3 IMPLANT
CATH SOFT-VU 4F 65 STRAIGHT (CATHETERS) ×2 IMPLANT
CATH SOFT-VU STRAIGHT 4F 65CM (CATHETERS) ×1
CATH TEMPO 5F RIM 65CM (CATHETERS) ×3 IMPLANT
COVER PRB 48X5XTLSCP FOLD TPE (BAG) ×2 IMPLANT
COVER PROBE 5X48 (BAG) ×1
KIT MICROINTRODUCER STIFF 5F (SHEATH) ×3 IMPLANT
KIT PV (KITS) IMPLANT
SHEATH PINNACLE 6F 10CM (SHEATH) ×3 IMPLANT
SYR MEDRAD MARK V 150ML (SYRINGE) IMPLANT
TRANSDUCER W/STOPCOCK (MISCELLANEOUS) IMPLANT
TRAY PV CATH (CUSTOM PROCEDURE TRAY) IMPLANT
WIRE BENTSON .035X145CM (WIRE) ×3 IMPLANT
WIRE SPARTACORE .014X190CM (WIRE) ×3 IMPLANT

## 2017-07-29 NOTE — Op Note (Addendum)
OPERATIVE NOTE   PROCEDURE: 1.  Right common femoral artery cannulation under ultrasound guidance 2.  Placement of catheter in aorta 3.  Aortogram 4.  Selection of celiac artery 5.  Celiac artery angiogram 6.  Selection of Inferior mesenteric artery  7.  Inferior mesenteric angiogram 8.  Conscious sedation for 63 minutes  PRE-OPERATIVE DIAGNOSIS: superior mesenteric artery dissection resulting superior mesenteric artery thrombosis  POST-OPERATIVE DIAGNOSIS: same as above   SURGEON: Adele Barthel, MD  ANESTHESIA: conscious sedation  ESTIMATED BLOOD LOSS: 50 cc  CONTRAST: 155 cc  FINDING(S):  Aorta: patent  Renal arteries: bilaterally patent without any aneurysmal changes or pseudoaneurysm evident  Celiac artery: patent, minimal superior mesenteric artery collateral filling  Superior mesenteric artery: bird beak appearance in proximal artery consistent with dissection, no filling of proximal or mid-segment noted consistent with thrombosis, Inferior mesenteric artery fills distal segment, including ileocolic artery  Inferior mesenteric artery: dominant blood supply of colon, rapid filling of colonic arcade in a retrograde fashion, collateralizes with distal SMA Widely patent bilateral common, internal, and external iliac arteries  SPECIMEN(S):  none  INDICATIONS:   Marcus Pruitt is a 47 y.o. male who presents with superior mesenteric artery dissection that resulted in thrombosis and persistent abdominal pain, weight loss, and abdominal pain .  The patient presents for: aortogram, celiac and Inferior mesenteric artery angiography.  I discussed with the patient the nature of angiographic procedures, especially the limited patencies of any endovascular intervention.  The patient is aware of that the risks of an angiographic procedure include but are not limited to: bleeding, infection, access site complications, renal failure, embolization, rupture of vessel, dissection,  possible need for emergent surgical intervention, possible need for surgical procedures to treat the patient's pathology, and stroke and death.  The patient is aware of the risks and agrees to proceed.   DESCRIPTION: After full informed consent was obtained from the patient, the patient was brought back to the angiography suite.  The patient was placed supine upon the angiography table and connected to cardiopulmonary monitoring equipment.  The patient was then given conscious sedation, the amounts of which are documented in the patient's chart.  A circulating radiologic technician maintained continuous monitoring of the patient's cardiopulmonary status.  Additionally, the control room radiologic technician provided backup monitoring throughout the procedure.  The patient was prepped and drape in the standard fashion for an angiographic procedure.  At this point, attention was turned to the right groin.  Under ultrasound guidance, the subcutaneous tissue surrounding the right common femoral artery was anesthesized with 1% lidocaine with epinephrine.  The artery was then cannulated with a micropuncture needle.  The microwire was advanced into the iliac arterial system.  The needle was exchanged for a microsheath, which was loaded into the common femoral artery over the wire.  The microwire was exchanged for a Bentson wire which was advanced into the aorta.  The microsheath was then exchanged for a 5-Fr sheath which was loaded into the common femoral artery.  The Omniflush catheter was then loaded over the wire up to the level of T11-12.  The catheter was connected to the power injector circuit.  After de-airring and de-clotting the circuit, a power injector aortogram was completed.  A lateral aortogram was then completed.  This demonstrated occluded dissected superior mesenteric artery with widely patent celiac artery.  There did not appeared to be any proximal superior mesenteric artery recannulation.  There  was rapid filling of the colonic arterial arcades by  the Inferior mesenteric artery.  I replaced the wire into the catheter, straightening out the crook in the catheter.  I exchanged the wire for a Cobra catheter, which I used to select the celiac artery.  This would not stay in the celiac artery; however, so I eventually had to advance a 0.014" Spartacore into the common hepatic artery and exchanged the catheter for a 4-Fr straight catheter, which I advanced into the proximal celiac artery.  A power injector was completed in anterior-posterior orientation.  The findings are listed above.  Essentially there is minimal collateral filling of the superior mesenteric artery proximally.  I removed the straight catheter.    I replaced the pigtail catheter with Bentson wire in the distal aorta.  Aortogram at 15, 30, and 45 degree RAO had to be done before I could visualize the takeoff of the Inferior mesenteric artery.  I selected the Inferior mesenteric artery with a RIM catheter and Bentson wire.  I completed hand injections to fully image the hypertrophied Inferior mesenteric artery filling the colonic arcades and retrograde filling the superior mesenteric artery via the ileocolic artery.  I disengaged the RIM catheter.  I replaced the wire into the catheter, straightening out the crook in the catheter.  Both were removed from the sheath together.  The sheath was aspirated.  No clots were present and the sheath was reloaded with heparinized saline.    Based on the images, the following can be determined:  1.  The celiac artery is widely patent with minimal collateral filling of superior mesenteric artery branches. 2.  The superior mesenteric artery is thrombosed due to a dissection in the proximal artery.  There was no imaged proximal or mid-segment filling. 3.  No perfusion of the small intestine was imaged. 4.  The colon appears to be rapidly perfused by the Inferior mesenteric artery.   5.  The anferior  mesenteric artery appears to collateralize with the distal superior mesenteric artery which is patent via the ileocolic artery.  No definitive comment on the viability of this patient's small intestine perfusion can be made from the mesenteric angiogram.  There does not be a simple way to revascularize this patient's superior mesenteric artery.   COMPLICATIONS: none  CONDITION: stable   Adele Barthel, MD, [facility] [place] Office: [phone] Pager: [phone]  07/29/2017, 2:15 PM

## 2017-07-29 NOTE — Interval H&P Note (Signed)
   History and Physical Update  The patient was interviewed and re-examined.  The patient's previous History and Physical has been reviewed and is unchanged from my consult.  There is no change in the plan of care: aortogram, selective angiogram of celiac artery and Inferior mesenteric artery.   I discussed with the patient the nature of angiographic procedures, especially the limited patencies of any endovascular intervention.    The patient is aware of that the risks of an angiographic procedure include but are not limited to: bleeding, infection, access site complications, renal failure, embolization, rupture of vessel, dissection, arteriovenous fistula, possible need for emergent surgical intervention, possible need for surgical procedures to treat the patient's pathology, anaphylactic reaction to contrast, and stroke and death.    The patient is aware of the risks and agrees to proceed.   Adele Barthel, MD, FACS [facility] [place] Office: [phone] Pager: [phone]  07/29/2017, 11:05 AM

## 2017-07-29 NOTE — Progress Notes (Signed)
K+ 3.4, reported to [employer] // cath lab, no orders followed

## 2017-07-29 NOTE — Progress Notes (Signed)
Site area: Right groin a 6 french arterial sheath was removed  Site Prior to Removal:  Level 0  Pressure Applied For 20 MINUTES    Bedrest Beginning at 1515p  Manual:   Yes.    Patient Status During Pull:  stable  Post Pull Groin Site:  Level 0  Post Pull Instructions Given:  Yes.    Post Pull Pulses Present:  Yes.    Dressing Applied:  Yes.    Comments:  BP med effective  BP 155/98

## 2017-07-29 NOTE — Discharge Instructions (Signed)
RESTART XARELTO 07/30/17  Angiogram, Care After This sheet gives you information about how to care for yourself after your procedure. Your health care provider may also give you more specific instructions. If you have problems or questions, contact your health care provider. What can I expect after the procedure? After the procedure, it is common to have bruising and tenderness at the catheter insertion area. Follow these instructions at home: Insertion site care  Follow instructions from your health care provider about how to take care of your insertion site. Make sure you: ? Wash your hands with soap and water before you change your bandage (dressing). If soap and water are not available, use hand sanitizer. ? Change your dressing as told by your health care provider. ? Leave stitches (sutures), skin glue, or adhesive strips in place. These skin closures may need to stay in place for 2 weeks or longer. If adhesive strip edges start to loosen and curl up, you may trim the loose edges. Do not remove adhesive strips completely unless your health care provider tells you to do that.  Do not take baths, swim, or use a hot tub until your health care provider approves.  You may shower 24-48 hours after the procedure or as told by your health care provider. ? Gently wash the site with plain soap and water. ? Pat the area dry with a clean towel. ? Do not rub the site. This may cause bleeding.  Do not apply powder or lotion to the site. Keep the site clean and dry.  Check your insertion site every day for signs of infection. Check for: ? Redness, swelling, or pain. ? Fluid or blood. ? Warmth. ? Pus or a bad smell. Activity  Rest as told by your health care provider, usually for 1-2 days.  Do not lift anything that is heavier than 10 lbs. (4.5 kg) or as told by your health care provider.  Do not drive for 24 hours if you were given a medicine to help you relax (sedative).  Do not drive or use  heavy machinery while taking prescription pain medicine. General instructions  Return to your normal activities as told by your health care provider, usually in about a week. Ask your health care provider what activities are safe for you.  If the catheter site starts bleeding, lie flat and put pressure on the site. If the bleeding does not stop, get help right away. This is a medical emergency.  Drink enough fluid to keep your urine clear or pale yellow. This helps flush the contrast dye from your body.  Take over-the-counter and prescription medicines only as told by your health care provider.  Keep all follow-up visits as told by your health care provider. This is important. Contact a health care provider if:  You have a fever or chills.  You have redness, swelling, or pain around your insertion site.  You have fluid or blood coming from your insertion site.  The insertion site feels warm to the touch.  You have pus or a bad smell coming from your insertion site.  You have bruising around the insertion site.  You notice blood collecting in the tissue around the catheter site (hematoma). The hematoma may be painful to the touch. Get help right away if:  You have severe pain at the catheter insertion area.  The catheter insertion area swells very fast.  The catheter insertion area is bleeding, and the bleeding does not stop when you hold steady pressure  on the area.  The area near or just beyond the catheter insertion site becomes pale, cool, tingly, or numb. These symptoms may represent a serious problem that is an emergency. Do not wait to see if the symptoms will go away. Get medical help right away. Call your local emergency services (911 in the [place] Do not drive yourself to the hospital. Summary  After the procedure, it is common to have bruising and tenderness at the catheter insertion area.  After the procedure, it is important to rest and drink plenty of  fluids.  Do not take baths, swim, or use a hot tub until your health care provider says it is okay to do so. You may shower 24-48 hours after the procedure or as told by your health care provider.  If the catheter site starts bleeding, lie flat and put pressure on the site. If the bleeding does not stop, get help right away. This is a medical emergency. This information is not intended to replace advice given to you by your health care provider. Make sure you discuss any questions you have with your health care provider. Document Released: 01/01/2005 Document Revised: 05/20/2016 Document Reviewed: 05/20/2016 Elsevier Interactive Patient Education  [employer].

## 2017-07-30 ENCOUNTER — Encounter (HOSPITAL_COMMUNITY): Payer: Self-pay | Admitting: Vascular Surgery

## 2017-07-30 ENCOUNTER — Telehealth: Payer: Self-pay | Admitting: Vascular Surgery

## 2017-07-30 MED FILL — Lidocaine HCl Local Inj 1%: INTRAMUSCULAR | Qty: 20 | Status: AC

## 2017-07-30 NOTE — Telephone Encounter (Signed)
-----   Message from Mena Goes, RN sent at 07/30/2017  9:56 AM EST ----- Regarding: RE: 4 weeks  Not yet, he may not show for the 4 weeks appt like he was told. Thanks ----- Message ----- From: Georgiann Mccoy Sent: 07/30/2017   9:48 AM To: Mena Goes, RN Subject: RE: 4 weeks                                    Should we then cancel the 09/08/17 appt  ----- Message ----- From: Mena Goes, RN Sent: 07/30/2017   8:34 AM To: [facility] Rose Admin Pool Subject: FW: 4 weeks                                      ----- Message ----- From: Mena Goes, RN Sent: 07/29/2017   2:47 PM To: Lole[facility] Rose Clinical Pool Subject: 4 weeks                                          ----- Message ----- From: Conrad , MD Sent: 07/29/2017   2:34 PM To: [facility] Charge [address] Michal Callicott [phone] 1970/11/08  PROCEDURE: 1.  Right common femoral artery cannulation under ultrasound guidance 2.  Placement of catheter in aorta 3.  Aortogram 4.  Selection of celiac artery 5.  Celiac artery angiogram 6.  Selection of Inferior mesenteric artery  7.  Inferior mesenteric angiogram 8.  Conscious sedation for 63 minutes  Follow-up: 4 weeks

## 2017-07-30 NOTE — Telephone Encounter (Signed)
Sched appt 08/25/17 at 12:45. Spoke to pt to inform them of appt.

## 2017-08-04 ENCOUNTER — Ambulatory Visit: Payer: Self-pay | Admitting: Family Medicine

## 2017-08-19 ENCOUNTER — Telehealth: Payer: Self-pay

## 2017-08-19 NOTE — Telephone Encounter (Signed)
Patient has been scheduled at [place] for 08/26/17 8:30.  He has a conflict on that day and would like to move it to Friday 08/27/17.  I mailed him instructions and he will call if he has questions.

## 2017-08-19 NOTE — Telephone Encounter (Signed)
-----   Message from Gatha Mayer, MD sent at 08/19/2017 10:42 AM EST ----- Regarding: RE: Opinion on Evaluation of Small Bowel We will get him in and do it  Am ccing my RN   Barbera Setters - explain to patient Dr. Bridgett Larsson and I recommend a capsule endo to evaluate diarrhea and blood in stool and schedule  Thanks    ----- Message ----- From: Conrad Naplate, MD Sent: 08/18/2017   5:12 PM To: Gatha Mayer, MD Subject: RE: Opinion on Evaluation of Small Bowel       I appreciate your response.  Do I need to refer him back to you or will your office arrange the capsule endoscopy?  Thank you again for your help with this difficult case.  Aaron Edelman  ----- Message ----- From: Gatha Mayer, MD Sent: 08/18/2017   4:26 PM To: Conrad Milford, MD Subject: RE: Opinion on Evaluation of Small Bowel       I think a capsule endoscopy can be done  Extremely rare risk of the capsule being trapped but again unlikely and if so would indicate a need for it to be repaoired by surgery anyway.  Glendell Docker ----- Message ----- From: Conrad Mobile, MD Sent: 08/17/2017  10:12 AM To: Gatha Mayer, MD Subject: Opinion on Evaluation of Small Bowel           Dr. Carlean Purl:  I appreciate you see Mr. Loring Liskey for his intestinal issues.  In short, he is a 47 y.o. (July 07, 1970) male who had an isolated SMA dissection that was in the process of thrombosing resulting in acute mesenteric ischemia.  I fenestrated the dissection to salvage the SMA.  Unfortunately, subsequent CTA demonstrated the SMA thrombosed.  I recently shot a dedicated Celiac and IMA angiogram which demonstrated:  1. Limited CA to SMA collaterals 2. No imaged small intestine perfusion 3. IMA completely perfuses colonic perfusion rapidly and reconstitutes a ileocolonic artery  Is there a way to evaluate the entire small bowel for ischemia short of exploratory laparatomy?  Would capsule endoscopy be adequate?  Most of the literature I've reviewed doesn't really  apply to this patient.  Additionally, the SMA was on exam 10-12 mm in diameter, suggesting there may also be connective tissue disorder vs vasculitis also.  Appreciate your input.  Adele Barthel

## 2017-08-20 ENCOUNTER — Telehealth: Payer: Self-pay | Admitting: Internal Medicine

## 2017-08-20 NOTE — Telephone Encounter (Signed)
Helped patient move capsule endo back to 08/26/17.  We reviewed instructions.  He will call if he has any questions

## 2017-08-24 NOTE — Progress Notes (Signed)
Post-operative Open Mesenteric Repair   History of Present Illness   Marcus Pruitt is a 47 y.o. male who presents cc: abd pain and diarrhea.  Prior Procedures: 1.  Ao, IMA and CA angiogram (07/29/16) 2.  TE SMA, open resection of SMA dissection flap, BPA SMA(05/15/17) 3.  X-lap, repair of dehiscence and pelvic washout (05/20/17).   The patient continues to haveabdominal pain which had worsens.  Patient notes mechanical pain with position change.  Previously this was responsive to Tylenol.  Now this is inadequate at night time.  Patient notes he is now tolerating a bland diet without diarrhea.  He has abdominal pain and gurgling when he eats any fatty or fried food which result in diarrhea after eating.  The patient is scheduled for capsule endoscopy tomorrow.   Physical Examination   Vitals:   08/25/17 1236  BP: (!) 133/93  Pulse: 70  Resp: 16  Temp: 98.5 F (36.9 C)  TempSrc: Oral  SpO2: 100%  Weight: 177 lb (80.3 kg)  Height: 5\' 6"  (1.676 m)   Body mass index is 28.57 kg/m.  Gastrointestinal: soft, ND, TTP epigastrium, no obvious hernia, -G/R, - HSM, - masses, - CVAT B   Laboratory   CBC CBC Latest Ref Rng & Units 07/29/2017 07/29/2017 07/05/2017  WBC 4.0 - 10.5 K/uL 3.6(L) - 4.5  Hemoglobin 13.0 - 17.0 g/dL 12.1(L) 13.6 12.4(L)  Hematocrit 39.0 - 52.0 % 37.4(L) 40.0 39.1  Platelets 150 - 400 K/uL 254 - 304.0    BMP BMP Latest Ref Rng & Units 07/29/2017 07/05/2017 06/09/2017  Glucose 65 - 99 mg/dL 86 91 88  BUN 6 - 20 mg/dL 5(L) 5(L) 12  Creatinine 0.61 - 1.24 mg/dL 0.90 0.98 0.94  Sodium 135 - 145 mmol/L 143 140 135  Potassium 3.5 - 5.1 mmol/L 3.4(L) 3.9 4.0  Chloride 101 - 111 mmol/L 102 105 102  CO2 19 - 32 mEq/L - 27 26  Calcium 8.4 - 10.5 mg/dL - 9.5 8.7    Coagulation Lab Results  Component Value Date   INR 1.24 05/15/2017   INR 1.19 05/15/2017   INR 0.97 05/15/2017   No results found for: PTT  Lipids    Component Value Date/Time   CHOL  205 (H) 03/11/2015 0904   TRIG 117 05/24/2017 0519   HDL 47.00 03/11/2015 0904   CHOLHDL 4 03/11/2015 0904   VLDL 41.8 (H) 03/11/2015 0904   LDLCALC 95 01/21/2011 1534   LDLDIRECT 118.0 03/11/2015 0904   CRP <0.8  Prealbumin: 25.3  Alb 6.4   Medical Decision Making   Marcus Pruitt is a 47 y.o. male who presents  s/p SMA fenestration with BPA, X-lap with fascia repair for acute mesenteric ischemia due to SMA dissection   Per my conversation with Dr. Carlean Purl, will get a capsule endoscopy to evaluate the small intestine for any obvious ischemia.    Any ischemic segments of the small intestine would need resection.  Based on the angiogram, the proximal SMA is likely non-salvageable already.  The dissected SMA occluded despite attempts to salvage the SMA by fenestrating the dissection by resecting the flap.   Obviously there is small intestine perfusion at this point, likely via collaterals, as the patient would be dead otherwise.  I would also expect the nutrition labs to be worst if this patient was not getting some intestinal absorption.  Will awaiting findings of the capsule endoscopy.   Thank you for allowing [place] to participate in this  patient's care.   Adele Barthel, MD, FACS [facility] [place] Office: [phone] Pager: [phone]

## 2017-08-25 ENCOUNTER — Other Ambulatory Visit: Payer: Self-pay

## 2017-08-25 ENCOUNTER — Encounter: Payer: Self-pay | Admitting: Vascular Surgery

## 2017-08-25 ENCOUNTER — Ambulatory Visit (INDEPENDENT_AMBULATORY_CARE_PROVIDER_SITE_OTHER): Payer: 59 | Admitting: Vascular Surgery

## 2017-08-25 DIAGNOSIS — K55069 Acute infarction of intestine, part and extent unspecified: Secondary | ICD-10-CM | POA: Diagnosis not present

## 2017-08-25 MED ORDER — OXYCODONE-ACETAMINOPHEN 5-325 MG PO TABS: 1.0000 | ORAL_TABLET | ORAL | 0 refills | Status: DC | PRN

## 2017-08-26 ENCOUNTER — Encounter: Payer: Self-pay | Admitting: Internal Medicine

## 2017-08-26 ENCOUNTER — Ambulatory Visit (HOSPITAL_COMMUNITY)
Admission: RE | Admit: 2017-08-26 | Discharge: 2017-08-26 | Disposition: A | Discharge status: Home / Self Care | Payer: 59 | Source: Ambulatory Visit | Attending: Internal Medicine | Admitting: Internal Medicine

## 2017-08-26 ENCOUNTER — Encounter (HOSPITAL_COMMUNITY)
Admission: RE | Disposition: A | Discharge status: Home / Self Care | Payer: Self-pay | Source: Ambulatory Visit | Attending: Internal Medicine

## 2017-08-26 ENCOUNTER — Encounter (HOSPITAL_COMMUNITY): Payer: Self-pay | Admitting: Internal Medicine

## 2017-08-26 DIAGNOSIS — R197 Diarrhea, unspecified: Secondary | ICD-10-CM | POA: Diagnosis not present

## 2017-08-26 DIAGNOSIS — K921 Melena: Secondary | ICD-10-CM | POA: Insufficient documentation

## 2017-08-26 HISTORY — PX: GIVENS CAPSULE STUDY: SHX5432

## 2017-08-26 SURGERY — IMAGING PROCEDURE, GI TRACT, INTRALUMINAL, VIA CAPSULE

## 2017-08-26 NOTE — Progress Notes (Signed)
Patient here for Givens capsule study.  Patient tolerated well and swallowed pill with no problems.  Patient home with belt sensor and recorder.  Instructions given on when he can resume eating, and on returning equipment tomorrow to [facility].  Patient states understanding.

## 2017-09-01 ENCOUNTER — Telehealth: Payer: Self-pay | Admitting: Internal Medicine

## 2017-09-01 NOTE — Telephone Encounter (Signed)
Patient notified that preliminary read by Nicoletta Ba PA was negative, but Dr. Carlean Purl will need to read and we will call him next week with the results.

## 2017-09-06 NOTE — Progress Notes (Signed)
    Post-operative Open Mesenteric Repair   History of Present Illness   Marcus Pruitt is a 47 y.o. male who presents cc: post-prandial abdominal pain.  Prior Procedures: 1. Capsule endoscopy (Dr. Carlean Purl) 2.  Ao, IMA and CA angiogram (07/29/16) 3.  TE SMA, open resection of SMA dissection flap, BPA SMA(05/15/17) 4.  X-lap, repair of dehiscence and pelvic washout (05/20/17).   The patient continues to haveabdominal pain which gotten better then worse again.  He initially was getting epigastric pain after eating and now gets a sensation of obstruction in his lower belly after eating.  He notes loose BM within 1-2 hours after developing this sensation.  He denies any fever or chills.  He denies any nausea.    His recent capsule endoscopy was read as negative.   Physical Examination   Vitals:   09/08/17 1331  BP: (!) 135/94  Pulse: 78  Resp: 20  SpO2: 100%  Weight: 177 lb (80.3 kg)  Height: 5\' 6"  (1.676 m)   Body mass index is 28.57 kg/m.  Gastrointestinal: soft, ND, TTP epigastrium, no obvious hernia, -G/R, - HSM, - masses, - CVAT B   Medical Decision Making   Marcus Pruitt is a 47 y.o. (11-15-1970) male who presents s/p SMA fenestration with BPA, X-lap with fascia repair for acute mesenteric ischemia due to SMA dissection   Mesenteric angiogram demonstrated rapid perfusion of large intestine arcade via IMA  Celiac artery angiogram demonstrates perfusion of the celiac branches.  Capsule endoscopy demonstrates viable small bowel within the limits of that modality.  I doubt repeat SMA intervention is likely to be successful given the previous fenestration and patch angioplasty of the SMA failed while on full anticoagulation.  There doesn't appear to be a good target for a bypass anyway.  The ileocolic artery is already getting perfused via the IMA.  Will ask GI for further input: in regards to need for EGD/colonoscopy, further stools studies for  bacterial overgrowth given extended period of antibiotics for intrapelvic abscesses due to hematoma from the SMA procedure and post-op anticoagulation.  Xarelto was stopped today, as >3 months at this point.  I doubt any further benefit at this point, as any Xarelto assistant thrombolysis of mesenteric arcades and collaterals would have already occurred.  Will extend medical leave for another 3 month to continue work-up.  Will plan on CTA abd/pelvis in 3 months.  Per pt's insurance, will need to see that pt monthly.  Thank you for allowing [place] to participate in this patient's care.   Adele Barthel, MD, FACS [facility] [place] Office: [phone] Pager: [phone]

## 2017-09-07 ENCOUNTER — Telehealth: Payer: Self-pay

## 2017-09-07 ENCOUNTER — Encounter: Payer: Self-pay | Admitting: Vascular Surgery

## 2017-09-07 ENCOUNTER — Encounter: Payer: Self-pay | Admitting: Internal Medicine

## 2017-09-07 DIAGNOSIS — K921 Melena: Secondary | ICD-10-CM

## 2017-09-07 DIAGNOSIS — R197 Diarrhea, unspecified: Secondary | ICD-10-CM

## 2017-09-07 NOTE — Telephone Encounter (Signed)
-----   Message from Gatha Mayer, MD sent at 09/07/2017  4:14 PM EDT ----- Regarding: normal small bowel capsule test and sx update Please let patient know the small bowel looks good on the capsule test.  Ask how he is doing re: abdominal pain and diarrhea  I have cced his vascular surgeon on this note

## 2017-09-07 NOTE — Brief Op Note (Signed)
08/26/2017  4:13 PM  PATIENT:  Marcus Pruitt  47 y.o. male  PRE-OPERATIVE DIAGNOSIS:  diarrhea blood in stool  POST-OPERATIVE DIAGNOSIS:  * No post-op diagnosis entered *  PROCEDURE:  Procedure(s): GIVENS CAPSULE STUDY (N/A)  SURGEON:  Surgeon(s) and Role:    Gatha Mayer, MD - Primary  Normal stomach and small bowel.

## 2017-09-07 NOTE — Telephone Encounter (Signed)
Patient notified of the results He reports that his symptoms are unchanged.  He has watery  diarrhea about 2 hours after a meal.  Patient reports that pain with eating in his lower abdomen.  He is wondering if he can be tested SIBO?

## 2017-09-08 ENCOUNTER — Encounter: Payer: Self-pay | Admitting: Vascular Surgery

## 2017-09-08 ENCOUNTER — Other Ambulatory Visit: Payer: 59

## 2017-09-08 ENCOUNTER — Ambulatory Visit (INDEPENDENT_AMBULATORY_CARE_PROVIDER_SITE_OTHER): Payer: 59 | Admitting: Vascular Surgery

## 2017-09-08 ENCOUNTER — Other Ambulatory Visit: Payer: Self-pay

## 2017-09-08 VITALS — BP 135/94 | HR 78 | Resp 20 | Ht 66.0 in | Wt 177.0 lb

## 2017-09-08 DIAGNOSIS — K55069 Acute infarction of intestine, part and extent unspecified: Secondary | ICD-10-CM

## 2017-09-08 NOTE — Telephone Encounter (Signed)
I sent him a MyChart message

## 2017-09-09 ENCOUNTER — Telehealth: Payer: Self-pay

## 2017-09-09 MED ORDER — DICYCLOMINE HCL 20 MG PO TABS: 20.0000 mg | ORAL_TABLET | Freq: Three times a day (TID) | ORAL | 1 refills | Status: DC

## 2017-09-09 NOTE — Telephone Encounter (Signed)
-----   Message from Gatha Mayer, MD sent at 09/08/2017  5:45 PM EDT ----- Regarding: orders please 1) Breath test for SIBO  2) Colonoscopy (he tells me he is stopping Xarelto)  Dx is diarrhea, weight loss   Need to get breath test done before the colonoscopy as need to wait 30 d after a prep to do breath test   He can use dicyclomine 20 mg qac # 90 1 RF if he wants

## 2017-09-09 NOTE — Telephone Encounter (Signed)
Patient notified of the rescommendations He is aware that he will get a kit in the mail for lactulose breath test and to follow the instructions and return to the company Colonoscopy and pre-visit scheduled rx sent His last day of xeralto was yesterday.  I removed it from his medication list

## 2017-09-13 ENCOUNTER — Telehealth: Payer: Self-pay | Admitting: Internal Medicine

## 2017-09-13 NOTE — Telephone Encounter (Signed)
Patient advised ok to do breath test. He will call back for any additional questions.

## 2017-09-14 ENCOUNTER — Encounter: Payer: Self-pay | Admitting: Internal Medicine

## 2017-09-14 DIAGNOSIS — R197 Diarrhea, unspecified: Secondary | ICD-10-CM | POA: Diagnosis not present

## 2017-09-22 ENCOUNTER — Telehealth: Payer: Self-pay

## 2017-09-22 ENCOUNTER — Encounter: Payer: Self-pay | Admitting: Internal Medicine

## 2017-09-22 ENCOUNTER — Encounter: Payer: Self-pay | Admitting: Vascular Surgery

## 2017-09-22 DIAGNOSIS — K638219 Small intestinal bacterial overgrowth, unspecified: Secondary | ICD-10-CM | POA: Insufficient documentation

## 2017-09-22 DIAGNOSIS — K6389 Other specified diseases of intestine: Secondary | ICD-10-CM

## 2017-09-22 HISTORY — DX: Other specified diseases of intestine: K63.89

## 2017-09-22 HISTORY — DX: Small intestinal bacterial overgrowth, unspecified: K63.8219

## 2017-09-22 MED ORDER — RIFAXIMIN 550 MG PO TABS: 550.0000 mg | ORAL_TABLET | Freq: Three times a day (TID) | ORAL | 0 refills | Status: DC

## 2017-09-22 NOTE — Telephone Encounter (Signed)
Patient notified of the results and recommendations. Rx sent

## 2017-09-22 NOTE — Telephone Encounter (Signed)
-----   Message from Gatha Mayer, MD sent at 09/22/2017  9:55 AM EDT ----- Regarding: + SIBO Breath test + Let him know and Rx Xifaxan 550 mg tid x 14 d  Has diarrhea can say IBS D and SIBO for Dx  He should be able to complete his course of Tx before completing colonoscopy or take it after  He has an upcoming colonoscopy - 4/18

## 2017-09-28 ENCOUNTER — Encounter: Payer: Self-pay | Admitting: Internal Medicine

## 2017-09-30 ENCOUNTER — Ambulatory Visit: Payer: Self-pay | Admitting: *Deleted

## 2017-09-30 DIAGNOSIS — I10 Essential (primary) hypertension: Secondary | ICD-10-CM | POA: Diagnosis not present

## 2017-09-30 NOTE — Telephone Encounter (Signed)
Monitor for ER or Urgent care arrival

## 2017-09-30 NOTE — Telephone Encounter (Signed)
Pt called with c/o having elevated b/ps for today. He had a b/p of 132/84 yesterday. The highest today was 173/107 and the lowest was 135/93. He seems to be really stressed, having had mesenteric artery surgery this past year.  Pt states that he does have a dull headache but he thinks that is from having allergies, pollen.  He denies blurred vision, weakness, chest pain, difficulty breathing or nausea and vomiting. Per protocol, pt is to have an appointment within 3 days.  I have advised him to go urgent care to be evaluated.  Pt voiced understanding and advised to go to ED if he develops more cardiac symptoms. Flow at [employer] at [place] to be notified.  Reason for Disposition . Systolic BP  >= 756 OR Diastolic >= 433  Answer Assessment - Initial Assessment Questions 1. BLOOD PRESSURE: "What is the blood pressure?" "Did you take at least two measurements 5 minutes apart?"     173/107, 152/105  2. ONSET: "When did you take your blood pressure?"     This afternoon started at 2:30 3. HOW: "How did you obtain the blood pressure?" (e.g., visiting nurse, automatic home BP monitor)     Automatic bp at [facility] 4. HISTORY: "Do you have a history of high blood pressure?"     yes 5. MEDICATIONS: "Are you taking any medications for blood pressure?" "Have you missed any doses recently?"     Yes. Have not been taking bp meds because had been taken off since having the surgery 6. OTHER SYMPTOMS: "Do you have any symptoms?" (e.g., headache, chest pain, blurred vision, difficulty breathing, weakness)     Headache, 7. PREGNANCY: "Is there any chance you are pregnant?" "When was your last menstrual period?"     no  Protocols used: HIGH BLOOD PRESSURE-A-AH

## 2017-10-01 ENCOUNTER — Encounter: Payer: Self-pay | Admitting: Internal Medicine

## 2017-10-01 ENCOUNTER — Ambulatory Visit (AMBULATORY_SURGERY_CENTER): Payer: Self-pay | Admitting: *Deleted

## 2017-10-01 ENCOUNTER — Other Ambulatory Visit: Payer: Self-pay

## 2017-10-01 VITALS — Ht 66.0 in | Wt 179.6 lb

## 2017-10-01 DIAGNOSIS — R197 Diarrhea, unspecified: Secondary | ICD-10-CM

## 2017-10-01 NOTE — Telephone Encounter (Signed)
I spoke with pt and he did go to Urgent care yesterday afternoon, was advised to take Amlodipine, continue to monitor blood pressure and follow up with Dr. Sarajane Jews in 2 weeks. Pt took blood pressure twice this morning, range was 135-84/150/94, some where around there.

## 2017-10-01 NOTE — Progress Notes (Signed)
No egg or soy allergy known to patient  No issues with past sedation with any surgeries  or procedures, no intubation problems   One time had combative tendency after sedaton No diet pills per patient No home 02 use per patient  No blood thinners per patient  Was taken off of Xarelto 09/08/17 by Dr. Bridgett Larsson. Pt denies issues with constipation  No A fib or A flutter  EMMI video sent to pt's e mail

## 2017-10-01 NOTE — Telephone Encounter (Signed)
For review, please.

## 2017-10-04 NOTE — Telephone Encounter (Signed)
That sounds good. We can follow him up this week

## 2017-10-08 ENCOUNTER — Telehealth: Payer: Self-pay | Admitting: Internal Medicine

## 2017-10-08 NOTE — Telephone Encounter (Signed)
Patient states he just finished medication xifaxan and although his symptoms have improved they have not gone away completely. Patient wants to know if Dr.Gessner wanted to refill medication and have him keep taking it or wait till he comes in to see him.

## 2017-10-08 NOTE — Telephone Encounter (Signed)
He should hive it some more time - can see delayed benefit after the treatment  He is having a colonoscopy 4/18 and I will regroup with him then

## 2017-10-08 NOTE — Telephone Encounter (Signed)
Patient notified

## 2017-10-11 DIAGNOSIS — H5213 Myopia, bilateral: Secondary | ICD-10-CM | POA: Diagnosis not present

## 2017-10-12 NOTE — Progress Notes (Addendum)
Established Mesenteric Ischemia   History of Present Illness   Marcus Pruitt is a 47 y.o. ([birth year]) male who presents with chief complaint: improved abdominal pain.    Prior procedures include: 1.  Capsule endoscopy (Dr. Carlean Purl) 2.  Ao, IMA and CA angiogram (07/29/17) 3.  TE SMA, open resection of SMA dissection flap, BPA SMA (05/15/17) 4.  X-lap, repair of dehiscence and pelvic washout (05/20/17)  Pt's small bowel bacterial overgrowth study was positive.  The patient was started on rifaximin by GI.  The patient notes his stools are more formed and his abdominal pain is somewhat improved.  He notes some wt gain since then.  Pt has also has progressively increased BP and was recently started on low dose Norvasc.  The patient's PMH, PSH, SH, and FamHx were reviewed on 10/13/17 are unchanged from 09/08/17.  Current Outpatient Medications  Medication Sig Dispense Refill  . acetaminophen (TYLENOL) 500 MG tablet Take 1,000 mg by mouth every 6 (six) hours as needed for moderate pain or headache.    Marland Kitchen amLODipine (NORVASC) 5 MG tablet Take 1 tablet by mouth daily.  0  . oxyCODONE-acetaminophen (PERCOCET/ROXICET) 5-325 MG tablet Take 1 tablet by mouth every 4 (four) hours as needed for severe pain. 20 tablet 0  . rifaximin (XIFAXAN) 550 MG TABS tablet Take 1 tablet (550 mg total) by mouth 3 (three) times daily. 42 tablet 0   No current facility-administered medications for this visit.     On ROS today: less diarrhea, improved weight gain.   Physical Examination   Vitals:   10/13/17 1425  BP: 125/83  Pulse: 72  Resp: 16  Temp: 98.3 F (36.8 C)  TempSrc: Oral  SpO2: 100%  Weight: 177 lb (80.3 kg)  Height: 5\' 6"  (1.676 m)   Body mass index is 28.57 kg/m.  General Alert, O x 3, WD, NAD  Pulmonary Sym exp, good B air movt, CTA B  Cardiac RRR, Nl S1, S2, no Murmurs, No rubs, No S3,S4  Gastro- intestinal soft, mildly distended, TTP in epigastrium, No guarding or  rebound, no HSM, no masses, no CVAT B, No palpable prominent aortic pulse,    Musculo- skeletal M/S 5/5 throughout  , Extremities without ischemic changes   Neurologic Pain and light touch intact in extremities, Motor exam as listed above    Medical Decision Making   Marcus Pruitt is a 47 y.o. male who presents with: s/p failed SMA fenestration with BPA, X-lap with fascia repair for acute mesenteric ischemia due to SMA dissection   Angiography demonstrates intact perfusion to colon via IMA collaterals.  Capsule endoscopy demonstrates no evidence of obvious ischemic small intestine.  Will need time for the new antibiotic regimen to clear the small intestine bacterial overgrowth, but somewhat hopeful given some improvement in sx.  Again, I don't think there is a simple revascularization option in this patient, as the dissection appears to have extended 2/3-3/4 of the entire SMA length.  On the prior CTA, only an [place] of SMA was still perfused.  Will problem repeat abd/pelvis CTA in next 6 months to re-evaluate remodeling in the mesenteric vasculature.  Given patient's continued intermittent diarrhea possibly due to small intestine bacterial overgrowth, I don't think the patient is ready to resume working.  Its unclear to me if the patient's hypertension is just uncovered baseline essential hypertension that has not been evident due to patient's recurrent diarrhea leading to viable degree of dehydration.  As the patient is  now retaining his water, he can now present with his underlying hypertension.  Thank you for allowing [place] to participate in this patient's care.   Adele Barthel, MD, FACS [facility] Office: [phone] Pager: [phone]

## 2017-10-13 ENCOUNTER — Other Ambulatory Visit: Payer: Self-pay

## 2017-10-13 ENCOUNTER — Encounter: Payer: Self-pay | Admitting: Vascular Surgery

## 2017-10-13 ENCOUNTER — Ambulatory Visit (INDEPENDENT_AMBULATORY_CARE_PROVIDER_SITE_OTHER): Payer: 59 | Admitting: Vascular Surgery

## 2017-10-13 VITALS — BP 125/83 | HR 72 | Temp 98.3°F | Resp 16 | Ht 66.0 in | Wt 177.0 lb

## 2017-10-13 DIAGNOSIS — K55069 Acute infarction of intestine, part and extent unspecified: Secondary | ICD-10-CM

## 2017-10-14 ENCOUNTER — Encounter: Payer: Self-pay | Admitting: Internal Medicine

## 2017-10-14 ENCOUNTER — Ambulatory Visit (AMBULATORY_SURGERY_CENTER): Payer: 59 | Admitting: Internal Medicine

## 2017-10-14 ENCOUNTER — Other Ambulatory Visit: Payer: Self-pay

## 2017-10-14 VITALS — BP 128/82 | HR 69 | Temp 98.4°F | Resp 23 | Ht 66.0 in | Wt 179.6 lb

## 2017-10-14 DIAGNOSIS — D125 Benign neoplasm of sigmoid colon: Secondary | ICD-10-CM | POA: Diagnosis not present

## 2017-10-14 DIAGNOSIS — K635 Polyp of colon: Secondary | ICD-10-CM

## 2017-10-14 DIAGNOSIS — R197 Diarrhea, unspecified: Secondary | ICD-10-CM

## 2017-10-14 HISTORY — PX: COLONOSCOPY: SHX174

## 2017-10-14 MED ORDER — SODIUM CHLORIDE 0.9 % IV SOLN: 500.0000 mL | Freq: Once | INTRAVENOUS | Status: DC

## 2017-10-14 NOTE — Progress Notes (Signed)
Report to PACU, RN, vss, BBS= Clear.  

## 2017-10-14 NOTE — Op Note (Signed)
[place] Patient Name: Marcus Pruitt Procedure Date: 10/14/2017 3:28 PM MRN: [id number] Endoscopist: Gatha Mayer , MD Age: 47 Referring MD:  Date of Birth: 05/04/1971 Gender: Male Account #: [id number] Procedure:                Colonoscopy Indications:              Clinically significant diarrhea of unexplained                            origin Medicines:                Propofol per Anesthesia, Monitored Anesthesia Care Procedure:                Pre-Anesthesia Assessment:                           - Prior to the procedure, a History and Physical                            was performed, and patient medications and                            allergies were reviewed. The patient's tolerance of                            previous anesthesia was also reviewed. The risks                            and benefits of the procedure and the sedation                            options and risks were discussed with the patient.                            All questions were answered, and informed consent                            was obtained. Prior Anticoagulants: The patient has                            taken no previous anticoagulant or antiplatelet                            agents. ASA Grade Assessment: II - A patient with                            mild systemic disease. After reviewing the risks                            and benefits, the patient was deemed in                            satisfactory condition to undergo the procedure.  After obtaining informed consent, the colonoscope                            was passed under direct vision. Throughout the                            procedure, the patient's blood pressure, pulse, and                            oxygen saturations were monitored continuously. The                            Colonoscope was introduced through the anus and                            advanced to the the terminal ileum,  with                            identification of the appendiceal orifice and IC                            valve. The colonoscopy was somewhat difficult due                            to significant looping. Successful completion of                            the procedure was aided by applying abdominal                            pressure. The patient tolerated the procedure well.                            The quality of the bowel preparation was good. The                            bowel preparation used was Miralax. The terminal                            ileum, the appendiceal orifice and the rectum were                            photographed. Scope In: 3:33:55 PM Scope Out: 3:49:05 PM Scope Withdrawal Time: 0 hours 9 minutes 43 seconds  Total Procedure Duration: 0 hours 15 minutes 10 seconds  Findings:                 The perianal examination was normal.                           The digital rectal exam findings include surgically                            absent prostate.  A 12 mm polyp was found in the sigmoid colon. The                            polyp was pedunculated. The polyp was removed with                            a hot snare. Resection and retrieval were complete.                            Verification of patient identification for the                            specimen was done. Estimated blood loss: none.                           The terminal ileum appeared normal.                           Multiple diverticula were found in the entire colon.                           The exam was otherwise without abnormality on                            direct and retroflexion views.                           Biopsies for histology were taken with a cold                            forceps from the right colon and left colon for                            evaluation of microscopic colitis. Complications:            No immediate  complications. Estimated Blood Loss:     Estimated blood loss was minimal. Impression:               - A surgically absent prostate found on digital                            rectal exam.                           - One 12 mm polyp in the sigmoid colon, removed                            with a hot snare. Resected and retrieved.                           - The examined portion of the ileum was normal.                           - Diverticulosis in the entire examined colon.                           -  The examination was otherwise normal on direct                            and retroflexion views.                           - Biopsies were taken with a cold forceps from the                            right colon and left colon for evaluation of                            microscopic colitis. Recommendation:           - Patient has a contact number available for                            emergencies. The signs and symptoms of potential                            delayed complications were discussed with the                            patient. Return to normal activities tomorrow.                            Written discharge instructions were provided to the                            patient.                           - Resume previous diet.                           - Continue present medications.                           - Repeat colonoscopy is recommended. The                            colonoscopy date will be determined after pathology                            results from today's exam become available for                            review.                           - No aspirin, ibuprofen, naproxen, or other                            non-steroidal anti-inflammatory drugs for 2 weeks                            after  polyp removal. Gatha Mayer, MD 10/14/2017 3:57:30 PM This report has been signed electronically.

## 2017-10-14 NOTE — Patient Instructions (Addendum)
I did find a polyp and removed it - looks benign and do not think related to diarrhea.  Also saw diverticulosis - thickened muscle rings and pouches in the colon wall. Please read the handout about this condition.  I took biopsies of the lining to evaluate for a cause of diarrhea also - looks normal.  Will contact you with the results and to see how you are.  It is ok to use imodium AD (loperamide) as needed.  I appreciate the opportunity to care for you.  No NSAIDS FOR TWO WEEKS  (10/28/2017) this includes Aspirin, Ibuprofen, Naproxen, or other non-steroidal anti-inflammatories  Gatha Mayer, MD, FACG    YOU HAD AN ENDOSCOPIC PROCEDURE TODAY AT [facility]:   Refer to the procedure report that was given to you for any specific questions about what was found during the examination.  If the procedure report does not answer your questions, please call your gastroenterologist to clarify.  If you requested that your care partner not be given the details of your procedure findings, then the procedure report has been included in a sealed envelope for you to review at your convenience later.  YOU SHOULD EXPECT: Some feelings of bloating in the abdomen. Passage of more gas than usual.  Walking can help get rid of the air that was put into your GI tract during the procedure and reduce the bloating. If you had a lower endoscopy (such as a colonoscopy or flexible sigmoidoscopy) you may notice spotting of blood in your stool or on the toilet paper. If you underwent a bowel prep for your procedure, you may not have a normal bowel movement for a few days.  Please Note:  You might notice some irritation and congestion in your nose or some drainage.  This is from the oxygen used during your procedure.  There is no need for concern and it should clear up in a day or so.  SYMPTOMS TO REPORT IMMEDIATELY:   Following lower endoscopy (colonoscopy or flexible  sigmoidoscopy):  Excessive amounts of blood in the stool  Significant tenderness or worsening of abdominal pains  Swelling of the abdomen that is new, acute  Fever of 100F or higher   Polyp handout given Diverticulosis  handout given  For urgent or emergent issues, a gastroenterologist can be reached at any hour by calling [phone].   DIET:  We do recommend a small meal at first, but then you may proceed to your regular diet.  Drink plenty of fluids but you should avoid alcoholic beverages for 24 hours.  ACTIVITY:  You should plan to take it easy for the rest of today and you should NOT DRIVE or use heavy machinery until tomorrow (because of the sedation medicines used during the test).    FOLLOW UP: Our staff will call the number listed on your records the next business day following your procedure to check on you and address any questions or concerns that you may have regarding the information given to you following your procedure. If we do not reach you, we will leave a message.  However, if you are feeling well and you are not experiencing any problems, there is no need to return our call.  We will assume that you have returned to your regular daily activities without incident.  If any biopsies were taken you will be contacted by phone or by letter within the next 1-3 weeks.  Please call us at [phone] if you  have not heard about the biopsies in 3 weeks.    SIGNATURES/CONFIDENTIALITY: You and/or your care partner have signed paperwork which will be entered into your electronic medical record.  These signatures attest to the fact that that the information above on your After Visit Summary has been reviewed and is understood.  Full responsibility of the confidentiality of this discharge information lies with you and/or your care-partner.

## 2017-10-14 NOTE — Progress Notes (Signed)
Called to room to assist during endoscopic procedure.  Patient ID and intended procedure confirmed with present staff. Received instructions for my participation in the procedure from the performing physician.  

## 2017-10-14 NOTE — Progress Notes (Signed)
Pt's states no medical or surgical changes since previsit or office visit. 

## 2017-10-18 ENCOUNTER — Telehealth: Payer: Self-pay | Admitting: *Deleted

## 2017-10-18 NOTE — Telephone Encounter (Signed)
  Follow up Call-  Call back number 10/14/2017  Post procedure Call Back phone  # [phone]  Permission to leave phone message Yes  Some recent data might be hidden     Patient questions:  Do you have a fever, pain , or abdominal swelling? No. Pain Score  0 *  Have you tolerated food without any problems? Yes.    Have you been able to return to your normal activities? Yes.    Do you have any questions about your discharge instructions: Diet   No. Medications  No. Follow up visit  No.  Do you have questions or concerns about your Care? No.  Actions: * If pain score is 4 or above: No action needed, pain <4.  Pt stated that the imodium that Dr. Carlean Purl recommended was working for him.

## 2017-10-21 NOTE — Progress Notes (Signed)
[facility] - 12 mm adenoma = 3 yr 2022 recall colon No letter  Office  - contact him - explain benign polyp - normal colon biopsies -  Please get a diarrhea symptom update and I will make recommendations

## 2017-10-23 ENCOUNTER — Encounter: Payer: Self-pay | Admitting: Vascular Surgery

## 2017-10-24 NOTE — Progress Notes (Signed)
Retreat with Xifaxan 550 mg tid x 14 d - dx SIBO and IBS-D  Schedule next avail f/u  Also have him update [place] after Xifaxan completed

## 2017-10-26 ENCOUNTER — Other Ambulatory Visit: Payer: Self-pay

## 2017-10-26 ENCOUNTER — Telehealth: Payer: Self-pay

## 2017-10-26 MED ORDER — RIFAXIMIN 550 MG PO TABS: 550.0000 mg | ORAL_TABLET | Freq: Three times a day (TID) | ORAL | 0 refills | Status: DC

## 2017-10-26 NOTE — Telephone Encounter (Signed)
Spoke with patient regarding referral to [facility]. Referral info was faxed to [facility] [phone].

## 2017-10-31 DIAGNOSIS — I10 Essential (primary) hypertension: Secondary | ICD-10-CM | POA: Diagnosis not present

## 2017-10-31 DIAGNOSIS — M25511 Pain in right shoulder: Secondary | ICD-10-CM | POA: Diagnosis not present

## 2017-10-31 DIAGNOSIS — R51 Headache: Secondary | ICD-10-CM | POA: Diagnosis not present

## 2017-10-31 DIAGNOSIS — I4510 Unspecified right bundle-branch block: Secondary | ICD-10-CM | POA: Diagnosis not present

## 2017-11-01 DIAGNOSIS — I4510 Unspecified right bundle-branch block: Secondary | ICD-10-CM | POA: Diagnosis not present

## 2017-11-02 DIAGNOSIS — C61 Malignant neoplasm of prostate: Secondary | ICD-10-CM | POA: Diagnosis not present

## 2017-11-05 ENCOUNTER — Ambulatory Visit: Payer: 59 | Admitting: Family Medicine

## 2017-11-05 ENCOUNTER — Encounter: Payer: Self-pay | Admitting: Family Medicine

## 2017-11-05 VITALS — BP 118/86 | HR 81 | Temp 98.1°F | Ht 66.0 in | Wt 179.8 lb

## 2017-11-05 DIAGNOSIS — I10 Essential (primary) hypertension: Secondary | ICD-10-CM | POA: Diagnosis not present

## 2017-11-05 MED ORDER — AMLODIPINE BESYLATE 5 MG PO TABS
5.0000 mg | ORAL_TABLET | Freq: Every day | ORAL | 3 refills | Status: DC
Start: 2017-11-05 — End: 2018-11-07

## 2017-11-05 NOTE — Progress Notes (Signed)
   Subjective:    Patient ID: Marcus Pruitt, male    DOB: Nov 04, 1970, 47 y.o.   MRN: [id number]  HPI Here to follow up a visit to the [facility] ED on 10-31-17. He had been experiencing a headache and shoulder pain that day and his BP at home had gone up to 183/113. He admits to not taking his Amlodipine regularly. At the ED his labs were normal including a creatinine of 0.96. His EKG and CXR were normal. He was told to take the medication as directed and to follow up with [place]. Since then he has been taking the medication regularly and his BP has been stable, in the range of 120s over 80s. He feels great.     Review of Systems  Constitutional: Negative.   Respiratory: Negative.   Cardiovascular: Negative.   Gastrointestinal: Negative.   Neurological: Negative.        Objective:   Physical Exam  Constitutional: He is oriented to person, place, and time. He appears well-developed and well-nourished.  Neck: No thyromegaly present.  Cardiovascular: Normal rate, regular rhythm, normal heart sounds and intact distal pulses.  Pulmonary/Chest: Effort normal and breath sounds normal.  Musculoskeletal: He exhibits no edema.  Lymphadenopathy:    He has no cervical adenopathy.  Neurological: He is alert and oriented to person, place, and time.          Assessment & Plan:  His HTN is now under good control. We refilled the prescription. We discussed diet and exercise as well.  Alysia Penna, MD

## 2017-11-08 DIAGNOSIS — C61 Malignant neoplasm of prostate: Secondary | ICD-10-CM | POA: Diagnosis not present

## 2017-11-10 ENCOUNTER — Ambulatory Visit: Payer: 59 | Admitting: Vascular Surgery

## 2017-11-11 ENCOUNTER — Telehealth: Payer: Self-pay | Admitting: Family Medicine

## 2017-11-11 NOTE — Telephone Encounter (Signed)
Copied from [place]. Topic: General - Other >> Nov 11, 2017 11:19 AM Yvette Rack wrote: Reason for CRM: Pt requested a call back. Pt states he wants to take an over the counter medication (Atrantil) for Sibo but he would like to know if it will interfere with his BP medication. Pt requests a call back. Cb# [phone]

## 2017-11-12 ENCOUNTER — Encounter: Payer: Self-pay | Admitting: Internal Medicine

## 2017-11-12 NOTE — Telephone Encounter (Signed)
Please advise? I attempted to look up medication interactions on Micromedex but could not find Atrantil.

## 2017-11-15 NOTE — Telephone Encounter (Signed)
I have no idea what this is so I cannot advise one way or the other

## 2017-11-15 NOTE — Telephone Encounter (Signed)
Pt notified of results/instructions and verbalized understanding. 

## 2017-11-16 ENCOUNTER — Encounter: Payer: Self-pay | Admitting: Internal Medicine

## 2017-11-16 ENCOUNTER — Telehealth: Payer: Self-pay | Admitting: Internal Medicine

## 2017-11-16 NOTE — Telephone Encounter (Signed)
Patient has completed the 2nd round of xifaxan.  Symptoms slightly improved.  Loose stools about an hour after a meal 1-2 times a day. C/O excess gas all the time.  Please advise if needs any additional tx.

## 2017-11-17 ENCOUNTER — Other Ambulatory Visit: Payer: Self-pay

## 2017-11-17 ENCOUNTER — Other Ambulatory Visit: Payer: Self-pay | Admitting: Internal Medicine

## 2017-11-17 ENCOUNTER — Ambulatory Visit (INDEPENDENT_AMBULATORY_CARE_PROVIDER_SITE_OTHER): Payer: 59 | Admitting: Vascular Surgery

## 2017-11-17 VITALS — BP 129/85 | HR 82 | Temp 98.2°F | Resp 18 | Ht 66.0 in | Wt 183.0 lb

## 2017-11-17 DIAGNOSIS — K55069 Acute infarction of intestine, part and extent unspecified: Secondary | ICD-10-CM | POA: Diagnosis not present

## 2017-11-17 MED ORDER — AMOXICILLIN-POT CLAVULANATE 875-125 MG PO TABS: 1.0000 | ORAL_TABLET | Freq: Two times a day (BID) | ORAL | 0 refills | Status: DC

## 2017-11-17 MED ORDER — OXYCODONE-ACETAMINOPHEN 5-325 MG PO TABS: 1.0000 | ORAL_TABLET | Freq: Four times a day (QID) | ORAL | 0 refills | Status: DC | PRN

## 2017-11-17 NOTE — Progress Notes (Signed)
Established Mesenteric Ischemia   History of Present Illness   Marcus Pruitt is a 47 y.o. (03/18/1971) male who presents with chief complaint: epigastric pressure pain.    Prior procedures include: 1. Capsule endoscopy (Dr. Carlean Purl) 2. Ao, IMA, and CA angiogram (07/29/17) 3. TE SMA, open resection of SMA dissection flap, BPA SMA (05/15/17)  Patient is second round of abx for possible small bowel overgrowth.  He continues to have unpredictable diarrhea without obvious trigger.  He is getting mod-severe epigastric pressure pain the seem to radiate under his xiphoid.  The patient notes he seems to be gaining some weight.  He denies any melanotic or bloody stools.  His referral to [facility] is schedule for Sept 2019.  The patient's PMH, PSH, SH, and FamHx were reviewed on 11/17/17 are unchanged from 10/13/17.  Current Outpatient Medications  Medication Sig Dispense Refill  . acetaminophen (TYLENOL) 500 MG tablet Take 1,000 mg by mouth every 6 (six) hours as needed for moderate pain or headache.    Marland Kitchen amLODipine (NORVASC) 5 MG tablet Take 1 tablet (5 mg total) by mouth daily. 90 tablet 3  . oxyCODONE-acetaminophen (PERCOCET/ROXICET) 5-325 MG tablet Take 1 tablet by mouth every 6 (six) hours as needed for moderate pain. 20 tablet 0  . rifaximin (XIFAXAN) 550 MG TABS tablet Take 1 tablet (550 mg total) by mouth 3 (three) times daily. (Patient not taking: Reported on 11/17/2017) 42 tablet 0   No current facility-administered medications for this visit.     On ROS today: continued diarrhea, continue abd pain.   Physical Examination   Vitals:   11/17/17 1500  BP: 129/85  Pulse: 82  Resp: 18  Temp: 98.2 F (36.8 C)  TempSrc: Oral  SpO2: 99%  Weight: 183 lb (83 kg)  Height: 5\' 6"  (1.676 m)  Prior weight: 177 lb   Body mass index is 29.54 kg/m.  General Alert, O x 3, WD, NAD  Pulmonary Sym exp, good B air movt, CTA B  Cardiac RRR, Nl S1, S2, no Murmurs, No rubs, No S3,S4    Vascular Vessel Right Left  Radial Palpable Palpable  Brachial Palpable Palpable  Carotid Palpable, No Bruit Palpable, No Bruit  Aorta Not palpable N/A  Femoral Palpable Palpable  Popliteal Not palpable Not palpable  PT Palpable Palpable  DP Palpable Palpable    Gastro- intestinal soft, non-distended, TTP in epigastrium, no obvious masses, some fascial laxity near umbilicus, No guarding or rebound, no HSM, no masses, no CVAT B, No palpable prominent aortic pulse,    Musculo- skeletal M/S 5/5 throughout  , Extremities without ischemic changes  , No edema present, No visible varicosities , No Lipodermatosclerosis present  Neurologic Pain and light touch intact in extremities , Motor exam as listed above    Medical Decision Making   Marcus Pruitt is a 47 y.o. 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 Celesta Aver original CTA abd/pelvis there was radiopaque structure seen that I did NOT see on angiography despite attempt to image such.  Pt continues to have sx in this epigastric area where the structure would have been located.    Will get PA/Lat CXR and KUB to evaluate for any radiopaque structure.  Will plan on get 6 month CTA abd/pelvis to check remodeling of his mesenteric arterial system.  In my opinion, would continue with abx treatment of possible small bowel overgrowth given the progress in sx and weight.  Would hold off on return to work until probably next month.  He may be ready at that time, as I would have ruled out any foreign bodies and another month of abx for his small bowel would have been completed.  Thank you for allowing [place] to participate in this patient's care.   Adele Barthel, MD, FACS [facility] [place] Office: [phone] Pager: [phone]

## 2017-11-17 NOTE — Progress Notes (Signed)
Still having postprandial mainly watery diarrhea.  Food diary he has not really pointed anything out but he still working on that.  While on Xifaxan and afterwards he is probably a little bit better.  He is gaining weight some.  The only abnormal test we have of his GI tract is a positive breath test for small intestinal bacterial overgrowth.  I am going to try a course of Augmentin and he will let me know the results of that treatment course.  He has gotten an appointment at [facility] for September.  I think having that on hand makes sense unless we fix him before then.

## 2017-11-19 ENCOUNTER — Telehealth: Payer: Self-pay | Admitting: Gastroenterology

## 2017-11-19 NOTE — Telephone Encounter (Signed)
Patient called at 545 AM. States he started Augmentin yesterday for SIBO. After the first dose states he noticed some red blood in his stools (which are baseline loose), seemed small volume. He then took another dose last night and the same thing occurred. He has no abdominal pains. Colonoscopy with sigmoid polyp removed about a week ago. He otherwise feels fine. I asked him to monitor this morning to see if he is having persistent symptoms, does not sound like significant bleeding at this time.   Glendell Docker, he wanted me to pass this on to you, he's hesitant to resume antibiotics in light of this symptom which I counseled him would be unusual to be related.   Barbera Setters can you check on this patient this morning and update Dr. Carlean Purl, patient wanted him to be aware. Thanks

## 2017-11-19 NOTE — Telephone Encounter (Signed)
Can be reached at [phone].

## 2017-11-19 NOTE — Telephone Encounter (Signed)
I patient reports that he just got off the phone with Dr. Carlean Purl and was advised to continue augmentin but call stop it is there was any more bleeding and give [place] a call.

## 2017-11-19 NOTE — Telephone Encounter (Signed)
I spoke to him - he will retry Augmentin  Colonoscopy was > 1 month ago so not from that

## 2017-11-26 ENCOUNTER — Telehealth: Payer: Self-pay | Admitting: Internal Medicine

## 2017-11-26 NOTE — Telephone Encounter (Signed)
Patient wanted you to know that his stool is burnt orange in color and watery. He says you asked him about this last week when you spoke. He has 2 days left of augmentin

## 2017-11-30 NOTE — Telephone Encounter (Signed)
Spoke to him  Still having diarrhea  Please leave Zenpep 40 K samples at desk - he will come midday or later  Take 2 w/ meals 1 w/ snacks  Give him enough for 5-7 days  He is to message me back after that

## 2017-12-01 ENCOUNTER — Encounter: Payer: Self-pay | Admitting: Internal Medicine

## 2017-12-01 MED ORDER — PANCRELIPASE (LIP-PROT-AMYL) 40000-126000 UNITS PO CPEP: 40000.0000 [IU] | ORAL_CAPSULE | Freq: Three times a day (TID) | ORAL | 0 refills | Status: DC

## 2017-12-01 NOTE — Telephone Encounter (Signed)
Patient notified to pick up samples.

## 2017-12-06 ENCOUNTER — Telehealth: Payer: Self-pay | Admitting: Internal Medicine

## 2017-12-06 NOTE — Telephone Encounter (Signed)
Pt would like to speak with you regarding his medications. He stated to have an update for you.

## 2017-12-06 NOTE — Telephone Encounter (Signed)
Left voicemail message to call me back.

## 2017-12-07 ENCOUNTER — Encounter: Payer: Self-pay | Admitting: Internal Medicine

## 2017-12-08 NOTE — Telephone Encounter (Signed)
Spoke with Marcus Pruitt and he said he is still having issues that when he eats within 2minutes he either has lots of gas or loose bowel movements. We went over his medicine list and I updated it today. Zenpep removed since it made things worse. Augmentin he finished the 10 day course so removed. He said his PCP had given him Lomotil and it only helped alittle so he stopped it as well. He has been talking with Dr Carlean Purl via [place].

## 2017-12-08 NOTE — Telephone Encounter (Signed)
Please have him try samples of Viberzi 100 mg bid and let me know how that works It is a treatment for IBS-D

## 2017-12-08 NOTE — Telephone Encounter (Signed)
Left voicemail for patient to call back. 

## 2017-12-09 MED ORDER — ELUXADOLINE 100 MG PO TABS: 1.0000 | ORAL_TABLET | Freq: Two times a day (BID) | ORAL | 0 refills | Status: DC

## 2017-12-09 NOTE — Telephone Encounter (Signed)
I spoke with Carver Fila and he will come get the Viberzi samples tomorrow when he gets back in town.

## 2017-12-13 ENCOUNTER — Telehealth: Payer: Self-pay | Admitting: Internal Medicine

## 2017-12-13 NOTE — Telephone Encounter (Signed)
Patient was returning Dottie's call from last week. He will call us back when he's tried more days of the Viberzi.

## 2017-12-13 NOTE — Telephone Encounter (Signed)
Pt called stating that he was returning a call from [place].

## 2017-12-22 ENCOUNTER — Other Ambulatory Visit: Payer: Self-pay

## 2017-12-22 ENCOUNTER — Encounter: Payer: Self-pay | Admitting: Vascular Surgery

## 2017-12-22 ENCOUNTER — Ambulatory Visit (INDEPENDENT_AMBULATORY_CARE_PROVIDER_SITE_OTHER): Payer: 59 | Admitting: Vascular Surgery

## 2017-12-22 VITALS — BP 129/75 | HR 77 | Temp 97.7°F | Ht 67.0 in | Wt 189.0 lb

## 2017-12-22 DIAGNOSIS — K55059 Acute (reversible) ischemia of intestine, part and extent unspecified: Secondary | ICD-10-CM

## 2017-12-22 DIAGNOSIS — K55069 Acute infarction of intestine, part and extent unspecified: Secondary | ICD-10-CM

## 2017-12-22 NOTE — Progress Notes (Signed)
   Interval Appointment  Pt's sx are improving with Viberzi.  His post-prandial pain is resolving.  He no longer needs narcotics.  He is starting to gain weight.  Vitals:   12/22/17 0852  BP: 129/75  Pulse: 77  Temp: 97.7 F (36.5 C)  TempSrc: Oral  SpO2: 100%  Weight: 189 lb (85.7 kg)  Height: 5\' 7"  (1.702 m)   Body mass index is 29.6 kg/m.  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   - Returned pt to work with Light duty: no lifting of 15 lb. - Will probably return him to full duty at next visit in 4-6 weeks. - Abd 2 view still needs to get done  Adele Barthel, MD, [facility] [place] Office: [phone] Pager: [phone]  12/22/2017, 9:34 AM

## 2017-12-23 ENCOUNTER — Ambulatory Visit
Admission: RE | Admit: 2017-12-23 | Discharge: 2017-12-23 | Disposition: A | Discharge status: Home / Self Care | Payer: 59 | Source: Ambulatory Visit | Attending: Vascular Surgery | Admitting: Vascular Surgery

## 2017-12-23 DIAGNOSIS — K55059 Acute (reversible) ischemia of intestine, part and extent unspecified: Secondary | ICD-10-CM

## 2017-12-23 DIAGNOSIS — T182XXA Foreign body in stomach, initial encounter: Secondary | ICD-10-CM | POA: Diagnosis not present

## 2017-12-27 ENCOUNTER — Encounter: Payer: Self-pay | Admitting: Internal Medicine

## 2017-12-27 ENCOUNTER — Ambulatory Visit: Payer: 59 | Admitting: Internal Medicine

## 2017-12-27 VITALS — BP 114/88 | HR 80 | Ht 66.0 in | Wt 192.6 lb

## 2017-12-27 DIAGNOSIS — K529 Noninfective gastroenteritis and colitis, unspecified: Secondary | ICD-10-CM

## 2017-12-27 DIAGNOSIS — E739 Lactose intolerance, unspecified: Secondary | ICD-10-CM

## 2017-12-27 DIAGNOSIS — K55069 Acute infarction of intestine, part and extent unspecified: Secondary | ICD-10-CM | POA: Diagnosis not present

## 2017-12-27 DIAGNOSIS — K580 Irritable bowel syndrome with diarrhea: Secondary | ICD-10-CM | POA: Diagnosis not present

## 2017-12-27 MED ORDER — CHOLESTYRAMINE LIGHT 4 G PO PACK: 4.0000 g | PACK | Freq: Two times a day (BID) | ORAL | 3 refills | Status: DC

## 2017-12-27 NOTE — Patient Instructions (Addendum)
  We will not make you a follow up appointment at this time since you have the appointment with [facility] in September.   We have sent the following medications to your pharmacy for you to pick up at your convenience: Prevalite , take one packet with supper   I appreciate the opportunity to care for you. Silvano Rusk, MD, [facility]

## 2017-12-27 NOTE — Progress Notes (Signed)
Marcus Pruitt 47 y.o. May 07, 1971 [id number]  Assessment & Plan:   Encounter Diagnoses  Name Primary?  . Chronic diarrhea Yes  . Lactose intolerance in adult - suspected   . Irritable bowel syndrome with diarrhea   . 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HCC)     For his diarrhea to improve symptoms we will give a trial of cholestyramine for grams with supper consider taking with 2 meals.  Side effects reviewed.  He will let me know how that is going by messaging me back.  Refer to hematology for evaluation after thrombosis question needs hypercoagulable work-up.  I appreciate the opportunity to care for this patient. CC: Laurey Morale, MD Dr. Adele Barthel  Subjective:   Chief Complaint: Diarrhea  HPI The patient is here for follow-up of his diarrhea that started after superior mesenteric artery thrombosis.  He has had a positive breath test for small intestinal bacterial overgrowth and has not responded to treatment with Xifaxan or Augmentin.  He has had a colonoscopy that was unrevealing with respect to his diarrhea though he did have a 12 mm adenoma.  A capsule endoscopy was unrevealing as well.  Antidiarrheals have been somewhat helpful.  A trial of Zen pep made him feel worse.  He has a pending appointment at [facility] for another opinion in September.  He is doing better on Viberzi though still having some loose stools at times.  He is not using any narcotic pain medications anymore.  He does think he is probably lactose intolerant based upon having diarrhea and borborygmi after eating milk products.  He does have frequent borborygmi otherwise.  He says he was never this way until he had the thrombosis.  He has not seen a hematologist or had a hypercoagulable work-up. No Known Allergies Current Meds  Medication Sig  . acetaminophen (TYLENOL) 500 MG tablet Take 1,000 mg by mouth every 6 (six) hours as needed for moderate pain or headache.  Marland Kitchen amLODipine (NORVASC) 5 MG tablet  Take 1 tablet (5 mg total) by mouth daily.  . Eluxadoline (VIBERZI) 100 MG TABS Take 1 tablet (100 mg total) by mouth 2 (two) times daily after a meal.  . oxyCODONE-acetaminophen (PERCOCET/ROXICET) 5-325 MG tablet Take 1 tablet by mouth every 6 (six) hours as needed for moderate pain.   Past Medical History:  Diagnosis Date  . Allergy   . Blood transfusion without reported diagnosis   . Clotting disorder ([place])   . H/O blood clots    mesenteric  . Hearing loss in right ear   . Hypertension   . Knee pain   . Low back pain   . Prostate cancer ([place])   . Sleep apnea    on CPAP  . Small intestinal bacterial overgrowth 09/22/2017   + Breath test 08/2017  . 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[place]) 04/2017   Past Surgical History:  Procedure Laterality Date  . ABDOMINAL WOUND DEHISCENCE N/A 05/20/2017   Procedure: REPAIR OF ABDOMINAL WOUND DEHISCENCE AND EVACUATION OF HEMATOMA;  Surgeon: Angelia Mould, MD;  Location: [place];  Service: Vascular;  Laterality: N/A;  . AORTOGRAM N/A 05/15/2017   Procedure: MESENTERIC AORTOGRAM;  Surgeon: Conrad Weed, MD;  Location: [place];  Service: Vascular;  Laterality: N/A;  . COLONOSCOPY    . GIVENS CAPSULE STUDY N/A 08/26/2017   Procedure: GIVENS CAPSULE STUDY;  Surgeon: Gatha Mayer, MD;  [facility] ENDOSCOPY;  Service: Endoscopy;  Laterality: N/A;  . INGUINAL HERNIA REPAIR Left  02/21/2016   Procedure: LAPAROSCOPIC INGUINAL HERNIA;  Surgeon: Alexis Frock, MD;  Location: [facility];  Service: Urology;  Laterality: Left;  . LYMPHADENECTOMY Bilateral 02/21/2016   Procedure: PELVIC LYMPHADENECTOMY;  Surgeon: Alexis Frock, MD;  Location: [facility];  Service: Urology;  Laterality: Bilateral;  . MANDIBLE SURGERY  1990  . MESENTERIC ARTERY BYPASS N/A 05/15/2017   Procedure: OPEN FENESTRATION SUPERIOR MESENTERIC ARTERY;  Surgeon: Conrad Cetronia, MD;  Location: [facility] OR;  Service: Vascular;  Laterality: N/A;  . PATCH ANGIOPLASTY N/A 05/15/2017   Procedure:  SUPERIOR MESENTERIC ARTERY PATCH ANGIOPLASTY USING PERI-GUARD PATCH;  Surgeon: Conrad Micro, MD;  Location: [place];  Service: Vascular;  Laterality: N/A;  . PERCUTANEOUS VENOUS THROMBECTOMY,LYSIS WITH INTRAVASCULAR ULTRASOUND (IVUS) N/A 05/15/2017   Procedure: THROMBECTOMY OF SUPERIOR MESENTERIC ARTERY;  Surgeon: Conrad Rodney Village, MD;  Location: [place];  Service: Vascular;  Laterality: N/A;  . ROBOT ASSISTED LAPAROSCOPIC RADICAL PROSTATECTOMY N/A 02/21/2016   Procedure: XI ROBOTIC [place];  Surgeon: Alexis Frock, MD;  Location: [facility];  Service: Urology;  Laterality: N/A;  . UMBILICAL HERNIA REPAIR  02/21/2016   Procedure: LAPAROSCOPIC UMBILICAL HERNIA;  Surgeon: Alexis Frock, MD;  Location: [facility];  Service: Urology;;  . 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   Review of Systems As above  Objective:   Physical Exam  BP 114/88   Pulse 80   Ht 5\' 6"  (1.676 m)   Wt 192 lb 9.6 oz (87.4 kg)   BMI 31.09 kg/m  NAD abd soft, + borborygmi NT

## 2017-12-28 ENCOUNTER — Encounter: Payer: Self-pay | Admitting: Vascular Surgery

## 2017-12-29 ENCOUNTER — Telehealth: Payer: Self-pay

## 2017-12-29 DIAGNOSIS — K55069 Acute infarction of intestine, part and extent unspecified: Secondary | ICD-10-CM

## 2017-12-29 NOTE — Telephone Encounter (Signed)
-----   Message from Gatha Mayer, MD sent at 12/29/2017  2:55 PM EDT ----- Regarding: hematology referral Needs referral to hematology (Dr. Alen Blew, Cheryl Flash) re: hx SMA thrombosis

## 2017-12-29 NOTE — Telephone Encounter (Signed)
I called and told Marcus Pruitt that we are putting in a referral to hematology per Dr Carlean Purl.

## 2018-01-03 ENCOUNTER — Encounter: Payer: Self-pay | Admitting: Hematology

## 2018-01-03 ENCOUNTER — Telehealth: Payer: Self-pay | Admitting: Hematology

## 2018-01-03 NOTE — Telephone Encounter (Signed)
New referral received from Dr. Carlean Purl for a hematology appt. Pt has been scheduled to see Dr. Irene Limbo on 7/25 at 10am. Pt aware to arrive 30 minutes early. Letter maild

## 2018-01-04 ENCOUNTER — Encounter: Payer: Self-pay | Admitting: Internal Medicine

## 2018-01-08 ENCOUNTER — Encounter: Payer: Self-pay | Admitting: Internal Medicine

## 2018-01-14 ENCOUNTER — Emergency Department (HOSPITAL_COMMUNITY): Payer: 59

## 2018-01-14 ENCOUNTER — Encounter (HOSPITAL_COMMUNITY): Payer: Self-pay | Admitting: Emergency Medicine

## 2018-01-14 ENCOUNTER — Emergency Department (HOSPITAL_COMMUNITY)
Admission: EM | Admit: 2018-01-14 | Discharge: 2018-01-14 | Disposition: A | Discharge status: Home / Self Care | Payer: 59 | Attending: Emergency Medicine | Admitting: Emergency Medicine

## 2018-01-14 DIAGNOSIS — N200 Calculus of kidney: Secondary | ICD-10-CM | POA: Diagnosis not present

## 2018-01-14 DIAGNOSIS — Z79899 Other long term (current) drug therapy: Secondary | ICD-10-CM | POA: Insufficient documentation

## 2018-01-14 DIAGNOSIS — I10 Essential (primary) hypertension: Secondary | ICD-10-CM | POA: Insufficient documentation

## 2018-01-14 DIAGNOSIS — Z87891 Personal history of nicotine dependence: Secondary | ICD-10-CM | POA: Diagnosis not present

## 2018-01-14 DIAGNOSIS — Z8546 Personal history of malignant neoplasm of prostate: Secondary | ICD-10-CM | POA: Diagnosis not present

## 2018-01-14 DIAGNOSIS — R1012 Left upper quadrant pain: Secondary | ICD-10-CM | POA: Diagnosis present

## 2018-01-14 DIAGNOSIS — R109 Unspecified abdominal pain: Secondary | ICD-10-CM | POA: Diagnosis not present

## 2018-01-14 DIAGNOSIS — N132 Hydronephrosis with renal and ureteral calculous obstruction: Secondary | ICD-10-CM | POA: Diagnosis not present

## 2018-01-14 LAB — URINALYSIS, ROUTINE W REFLEX MICROSCOPIC
Bilirubin Urine: NEGATIVE
Glucose, UA: NEGATIVE mg/dL
Ketones, ur: NEGATIVE mg/dL
Leukocytes, UA: NEGATIVE
NITRITE: NEGATIVE
Protein, ur: NEGATIVE mg/dL
SPECIFIC GRAVITY, URINE: 1.011 (ref 1.005–1.030)
pH: 6 (ref 5.0–8.0)

## 2018-01-14 LAB — COMPREHENSIVE METABOLIC PANEL
ALBUMIN: 4.4 g/dL (ref 3.5–5.0)
ALK PHOS: 60 U/L (ref 38–126)
ALT: 44 U/L (ref 0–44)
AST: 32 U/L (ref 15–41)
Anion gap: 11 (ref 5–15)
BILIRUBIN TOTAL: 0.9 mg/dL (ref 0.3–1.2)
BUN: 10 mg/dL (ref 6–20)
CO2: 23 mmol/L (ref 22–32)
Calcium: 9.5 mg/dL (ref 8.9–10.3)
Chloride: 105 mmol/L (ref 98–111)
Creatinine, Ser: 1.2 mg/dL (ref 0.61–1.24)
GFR calc Af Amer: >60 mL/min (ref >60)
GFR calc non Af Amer: >60 mL/min (ref >60)
GLUCOSE: 125 mg/dL — AB (ref 70–99)
POTASSIUM: 3.4 mmol/L — AB (ref 3.5–5.1)
Sodium: 139 mmol/L (ref 135–145)
TOTAL PROTEIN: 8.1 g/dL (ref 6.5–8.1)

## 2018-01-14 LAB — CBC
HEMATOCRIT: 42.3 % (ref 39.0–52.0)
Hemoglobin: 13.7 g/dL (ref 13.0–17.0)
MCH: 27.1 pg (ref 26.0–34.0)
MCHC: 32.4 g/dL (ref 30.0–36.0)
MCV: 83.8 fL (ref 78.0–100.0)
Platelets: 227 10*3/uL (ref 150–400)
RBC: 5.05 MIL/uL (ref 4.22–5.81)
RDW: 14.9 % (ref 11.5–15.5)
WBC: 5 10*3/uL (ref 4.0–10.5)

## 2018-01-14 LAB — LIPASE, BLOOD: Lipase: 36 U/L (ref 11–51)

## 2018-01-14 MED ORDER — KETOROLAC TROMETHAMINE 60 MG/2ML IM SOLN
60.0000 mg | Freq: Once | INTRAMUSCULAR | Status: AC
  Administered 2018-01-14: 60 mg via INTRAMUSCULAR
  Filled 2018-01-14: qty 2

## 2018-01-14 MED ORDER — OXYCODONE-ACETAMINOPHEN 5-325 MG PO TABS
1.0000 | ORAL_TABLET | Freq: Once | ORAL | Status: AC
  Administered 2018-01-14: 1 via ORAL
  Filled 2018-01-14: qty 1

## 2018-01-14 MED ORDER — TAMSULOSIN HCL 0.4 MG PO CAPS: 0.4000 mg | ORAL_CAPSULE | Freq: Every day | ORAL | 0 refills | Status: DC

## 2018-01-14 MED ORDER — OXYCODONE-ACETAMINOPHEN 5-325 MG PO TABS: 1.0000 | ORAL_TABLET | Freq: Four times a day (QID) | ORAL | 0 refills | Status: DC | PRN

## 2018-01-14 NOTE — ED Provider Notes (Signed)
[facility] EMERGENCY DEPARTMENT Provider Note   CSN: [id number] Arrival date & time: 01/14/18  2017     History   Chief Complaint Chief Complaint  Patient presents with  . Abdominal Pain    HPI Marcus Pruitt is a 47 y.o. male.  47 year old male with history of prostate cancer, superior mesenteric artery thrombosis, hypertension presents to the emergency department for evaluation of left-sided back pain.  He reports onset of pain acutely at 1800 while he was at work.  Pain has been constant and radiates to the groin and suprapubic region.  He has felt nauseous, but has not experienced vomiting.  He did have a normal bowel movement following onset of his pain, but this provided no relief.  No dysuria, hematuria, fever, trauma or injury.  Denies history of prior kidney stones.  The history is provided by the patient. No language interpreter was used.  Abdominal Pain      Past Medical History:  Diagnosis Date  . Allergy   . Blood transfusion without reported diagnosis   . Clotting disorder ([place])   . H/O blood clots    mesenteric  . Hearing loss in right ear   . Hypertension   . Knee pain   . Low back pain   . Prostate cancer ([place])   . Sleep apnea    on CPAP  . Small intestinal bacterial overgrowth 09/22/2017   + Breath test 08/2017  . 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[place]) 04/2017    Patient Active Problem List   Diagnosis Date Noted  . Small intestinal bacterial overgrowth 09/22/2017  . Diarrhea   . 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[place]) 08/25/2017  . Superior mesenteric vein thrombosis 07/16/2017  . Mesenteric artery vascular embolism ([place]) 05/15/2017  . Vascular disorder of small intestine ([place]) 05/15/2017  . Dissection of unspecified artery ([place]) 05/15/2017  . Prostate cancer ([place]) 02/21/2016  . Blepharitis of eyelid of right eye 05/08/2014  . HTN (hypertension) 03/30/2014  . OBSTRUCTIVE SLEEP APNEA 08/04/2010  . SNORING  07/21/2010  . ERECTILE DYSFUNCTION, ORGANIC 06/13/2010  . STRESS REACTION, ACUTE, WITH EMOTIONAL DISTURBANCE 04/18/2009  . INSOMNIA 04/18/2009  . Unspecified hearing loss 03/06/2009  . ALLERGIC RHINITIS 03/06/2009  . KNEE PAIN, BILATERAL 03/06/2009  . LOW BACK PAIN 03/06/2009  . PES PLANUS 03/06/2009    Past Surgical History:  Procedure Laterality Date  . ABDOMINAL WOUND DEHISCENCE N/A 05/20/2017   Procedure: REPAIR OF ABDOMINAL WOUND DEHISCENCE AND EVACUATION OF HEMATOMA;  Surgeon: Angelia Mould, MD;  Location: [facility];  Service: Vascular;  Laterality: N/A;  . AORTOGRAM N/A 05/15/2017   Procedure: MESENTERIC AORTOGRAM;  Surgeon: Conrad Braden, MD;  Location: [facility];  Service: Vascular;  Laterality: N/A;  . COLONOSCOPY    . GIVENS CAPSULE STUDY N/A 08/26/2017   Procedure: GIVENS CAPSULE STUDY;  Surgeon: Gatha Mayer, MD;  Location: [facility] ENDOSCOPY;  Service: Endoscopy;  Laterality: N/A;  . INGUINAL HERNIA REPAIR Left 02/21/2016   Procedure: LAPAROSCOPIC INGUINAL HERNIA;  Surgeon: Alexis Frock, MD;  Location: [facility];  Service: Urology;  Laterality: Left;  . LYMPHADENECTOMY Bilateral 02/21/2016   Procedure: PELVIC LYMPHADENECTOMY;  Surgeon: Alexis Frock, MD;  Location: [facility];  Service: Urology;  Laterality: Bilateral;  . MANDIBLE SURGERY  1990  . MESENTERIC ARTERY BYPASS N/A 05/15/2017   Procedure: OPEN FENESTRATION SUPERIOR MESENTERIC ARTERY;  Surgeon: Conrad Mason, MD;  Location: [facility];  Service: Vascular;  Laterality: N/A;  . PATCH ANGIOPLASTY N/A 05/15/2017  Procedure: SUPERIOR MESENTERIC ARTERY PATCH ANGIOPLASTY USING PERI-GUARD PATCH;  Surgeon: Conrad Bainbridge, MD;  Location: [facility];  Service: Vascular;  Laterality: N/A;  . PERCUTANEOUS VENOUS THROMBECTOMY,LYSIS WITH INTRAVASCULAR ULTRASOUND (IVUS) N/A 05/15/2017   Procedure: THROMBECTOMY OF SUPERIOR MESENTERIC ARTERY;  Surgeon: Conrad Beverly Beach, MD;  Location: [facility];  Service: Vascular;  Laterality: N/A;  . ROBOT ASSISTED  LAPAROSCOPIC RADICAL PROSTATECTOMY N/A 02/21/2016   Procedure: XI ROBOTIC ASSISTED LAPAROSCOPIC RADICAL PROSTATECTOMY;  Surgeon: Alexis Frock, MD;  Location: [facility];  Service: Urology;  Laterality: N/A;  . UMBILICAL HERNIA REPAIR  02/21/2016   Procedure: LAPAROSCOPIC UMBILICAL HERNIA;  Surgeon: Alexis Frock, MD;  Location: [facility];  Service: Urology;;  . [name]  07/29/2017   Procedure: Visceral Angiogram;  Surgeon: Conrad Fredericksburg, MD;  Location: [facility];  Service: Cardiovascular;;        Home Medications    Prior to Admission medications   Medication Sig Start Date End Date Taking? Authorizing Provider  acetaminophen (TYLENOL) 500 MG tablet Take 1,000 mg by mouth every 6 (six) hours as needed for moderate pain or headache.   Yes [provider]  amLODipine (NORVASC) 5 MG tablet Take 1 tablet (5 mg total) by mouth daily. 11/05/17  Yes Laurey Morale, MD  cholestyramine light (PREVALITE) 4 g packet Take 1 packet (4 g total) by mouth 2 (two) times daily. Start 1 with supper and go to 2/day if needed Patient taking differently: Take 4 g by mouth See admin instructions. Dissolve 4 grams into 2 ounces of water and drink two times a day-  to reduce elevated serum cholesterol 12/27/17  Yes Gatha Mayer, MD  Eluxadoline (VIBERZI) 100 MG TABS Take 1 tablet (100 mg total) by mouth 2 (two) times daily after a meal. Patient not taking: Reported on 01/14/2018 12/09/17   Gatha Mayer, MD  oxyCODONE-acetaminophen (PERCOCET/ROXICET) 5-325 MG tablet Take 1-2 tablets by mouth every 6 (six) hours as needed for moderate pain or severe pain. 01/14/18   Antonietta Breach, PA-C  tamsulosin (FLOMAX) 0.4 MG CAPS capsule Take 1 capsule (0.4 mg total) by mouth daily. 01/14/18   Antonietta Breach, PA-C    Family History Family History  Problem Relation Age of Onset  . Heart disease Maternal Grandmother   . Diabetes Unknown        fhx  . Prostate cancer Father   . Hypertension Mother   .  Stroke Mother   . Colon cancer Neg Hx   . Colon polyps Neg Hx   . Esophageal cancer Neg Hx   . Rectal cancer Neg Hx   . Stomach cancer Neg Hx     Social History Social History   Tobacco Use  . Smoking status: Former Smoker    Last attempt to quit: 01/28/2007    Years since quitting: 10.9  . Smokeless tobacco: Never Used  Substance Use Topics  . Alcohol use: Yes    Alcohol/week: 0.0 oz    Comment: occasional wine  . Drug use: No     Allergies   Patient has no known allergies.   Review of Systems Review of Systems  Gastrointestinal: Positive for abdominal pain.  Ten systems reviewed and are negative for acute change, except as noted in the HPI.     Physical Exam Updated Vital Signs BP 130/83   Pulse 87   Temp 97.7 F (36.5 C)   Resp 16   Ht 5\' 7"  (1.702 m)   Wt  86.2 kg (190 lb)   SpO2 97%   BMI 29.76 kg/m   Physical Exam  Constitutional: He is oriented to person, place, and time. He appears well-developed and well-nourished. No distress.  Nontoxic appearing and in NAD  HENT:  Head: Normocephalic and atraumatic.  Eyes: Conjunctivae and EOM are normal. No scleral icterus.  Neck: Normal range of motion.  Cardiovascular: Normal rate, regular rhythm and intact distal pulses.  Pulmonary/Chest: Effort normal. No respiratory distress.  Respirations even and unlabored  Abdominal: Soft.  Musculoskeletal: Normal range of motion.  Neurological: He is alert and oriented to person, place, and time. He exhibits normal muscle tone. Coordination normal.  Skin: Skin is warm and dry. No rash noted. He is not diaphoretic. No erythema. No pallor.  Psychiatric: He has a normal mood and affect. His behavior is normal.  Nursing note and vitals reviewed.    ED Treatments / Results  Labs (all labs ordered are listed, but only abnormal results are displayed) Labs Reviewed  COMPREHENSIVE METABOLIC PANEL - Abnormal; Notable for the following components:      Result Value    Potassium 3.4 (*)    Glucose, Bld 125 (*)    All other components within normal limits  URINALYSIS, ROUTINE W REFLEX MICROSCOPIC - Abnormal; Notable for the following components:   Color, Urine STRAW (*)    Hgb urine dipstick MODERATE (*)    Bacteria, UA RARE (*)    All other components within normal limits  LIPASE, BLOOD  CBC    EKG None  Radiology Ct Renal Stone Study  Result Date: 01/14/2018 CLINICAL DATA:  Periumbilical pain radiating to the left lower back onset this evening. EXAM: CT ABDOMEN AND PELVIS WITHOUT CONTRAST TECHNIQUE: Multidetector CT imaging of the abdomen and pelvis was performed following the standard protocol without IV contrast. COMPARISON:  07/09/2017 CTA, CT 05/14/2017 FINDINGS: Lower chest: Normal size heart without pericardial effusion. Clear lung bases. Hepatobiliary: No biliary dilatation. No space-occupying mass of the liver given limitations of a noncontrast study. No radiopaque calculi within the gallbladder. Pancreas: No pancreatic mass, ductal dilatation or inflammation. Adjacent tubular calcified structures likely represent the splenic artery. Spleen: No splenomegaly or mass. Adrenals/Urinary Tract: Normal bilateral adrenal glands. Stable right upper pole renal cyst measuring approximately 1.7 cm. Nonobstructing bilateral lower pole renal calculi, the largest is noted in the left lower pole measuring 6 x 4 x 4 mm. 1-2 mm calculi are noted in the lower pole the right kidney. Mild left-sided hydroureteronephrosis secondary to a left distal ureteral stone just proximal to the UVJ measuring 4 x 3 x 2 mm. The urinary bladder is physiologically distended without focal mural thickening. Stomach/Bowel: Small hiatal hernia. Physiologically distended stomach with normal duodenal sweep and ligament of Treitz position. No bowel obstruction or inflammation. Normal appendix. Scattered colonic diverticulosis is identified along the descending and sigmoid colon without acute  diverticulitis. Vascular/Lymphatic: Nonaneurysmal abdominal aorta. No lymphadenopathy. Reproductive: Prostatectomy. Other: Periumbilical fat containing hernia. Soft tissue ossification along the ventral mesenteric fat deep to midline ventral scarring. Findings likely represent areas of fat necrosis. Musculoskeletal: Degenerative disc disease L5-S1. IMPRESSION: 1. 4 x 3 x 2 mm calculus in the distal left ureter causing mild left-sided hydroureteronephrosis. 2. Bilateral nonobstructing renal calculi are noted, the largest in the left lower pole measuring 6 x 4 x 4 mm. 3. Stable cyst in the upper pole of the right kidney measuring approximately 1.7 cm. 4. Colonic diverticulosis without acute diverticulitis. 5. Sagittally oriented dystrophic calcifications within  the upper abdomen anteriorly compatible with fat necrosis and likely postop in etiology. 6. Small periumbilical fat containing hernia. Electronically Signed   By: Ashley Royalty M.D.   On: 01/14/2018 21:43    Procedures Procedures (including critical care time)  Medications Ordered in ED Medications  oxyCODONE-acetaminophen (PERCOCET/ROXICET) 5-325 MG per tablet 1 tablet (has no administration in time range)  oxyCODONE-acetaminophen (PERCOCET/ROXICET) 5-325 MG per tablet 1 tablet (1 tablet Oral Given 01/14/18 2034)  ketorolac (TORADOL) injection 60 mg (60 mg Intramuscular Given 01/14/18 2228)     Initial Impression / Assessment and Plan / ED Course  I have reviewed the triage vital signs and the nursing notes.  Pertinent labs & imaging results that were available during my care of the patient were reviewed by me and considered in my medical decision making (see chart for details).     Patient has been diagnosed with a kidney stone via CT. There is no evidence of significant hydronephrosis, serum creatine WNL, vitals sign stable and the pt does not have irratractable vomiting. Pt will be dc home with pain medications and has been advised to  follow up with Urology. Return precautions discussed and provided. Patient discharged in stable condition with no unaddressed concerns.   Final Clinical Impressions(s) / ED Diagnoses   Final diagnoses:  Kidney stone on left side    ED Discharge Orders        Ordered    tamsulosin (FLOMAX) 0.4 MG CAPS capsule  Daily     01/14/18 2308    oxyCODONE-acetaminophen (PERCOCET/ROXICET) 5-325 MG tablet  Every 6 hours PRN     01/14/18 2308       Antonietta Breach, PA-C 01/14/18 2317    Drenda Freeze, MD 01/14/18 2322

## 2018-01-14 NOTE — ED Notes (Signed)
Pt given narcotic pain medicine in triage. Advised of side effects and instructed to avoid driving for a minimum of four hours.  

## 2018-01-14 NOTE — ED Provider Notes (Signed)
MSE was initiated and I personally evaluated the patient and placed orders (if any) at  8:29 PM on January 14, 2018.  The patient appears stable so that the remainder of the MSE may be completed by another provider.  Patient placed in Quick Look pathway, seen and evaluated   Chief Complaint: Left flank pain  HPI:   Patient reports to ED for evaluation of acute onset constant left flank pain radiating to the groin and suprapubic area that began 2 hours ago while at work.  Reports having a normal bowel movement which did not help the pain.  Denies any dysuria.  Denies history of kidney stones.  Reports nausea with no vomiting.  Denies any fever.  Denies any injuries or falls.  ROS: Left flank pain  Physical Exam:   Gen: No distress  Neuro: Awake and Alert  Skin: Warm    Focused Exam: Left sided abdominal tenderness to palpation with no rebound or guarding noted.  Patient appears uncomfortable.   Initiation of care has begun. The patient has been counseled on the process, plan, and necessity for staying for the completion/evaluation, and the remainder of the medical screening examination    Delia Heady, PA-C 01/14/18 2030    Tegeler, Gwenyth Allegra, MD 01/15/18 [phone]

## 2018-01-14 NOTE — ED Triage Notes (Signed)
Pt reports flank pain with radiation into groin ongoing for approx 2 hours. Denies nausea. Poor PO intake today.

## 2018-01-14 NOTE — Discharge Instructions (Signed)
You have been prescribed Flomax to take daily to try and promote stone movement.  Take Percocet as prescribed for pain control.  You may supplement this with 400 mg ibuprofen every 6 hours as needed.  Follow-up with urology if symptoms persist.  You may also return to the emergency department for new or concerning symptoms.

## 2018-01-14 NOTE — ED Notes (Signed)
ED Provider at bedside. 

## 2018-01-19 NOTE — Progress Notes (Signed)
HEMATOLOGY/ONCOLOGY CONSULTATION NOTE  Date of Service: 01/20/2018  Patient Care Team: Laurey Morale, MD as PCP - General (Family Medicine)  CHIEF COMPLAINTS/PURPOSE OF CONSULTATION:  Superior mesenteric artery thrombosis   HISTORY OF PRESENTING ILLNESS:   Marcus Pruitt is a wonderful 47 y.o. male who has been referred to [place] by Dr. Alysia Penna for evaluation and management of Superior mesenteric artery thrombosis. The pt reports that he is doing well overall.   In November 2018 the pt presented to the ED for significant abdominal pain which was determined to be from an SMA thrombus which required multiple surgeries with vascular surgeon Dr. Adele Barthel. He notes that since his discharge he has had chronic diarrhea and has been seeing GI Dr. Carlean Purl, and is currently taking 100mg  Cholestyramine twice a day. He continues follow up with Dr. Adele Barthel. He is no longer taking any blood thinners and took these for 60 days before being stopped.   He had controlled high blood pressure prior to his SMA thrombosis, until a few days prior to his SMA when he forgot to take his medication. He denies any other previous blood clots. He notes that he had not had any previous abdominal surgeries. He adds that he was taking a powdered supplement which contained 5g L-Arginine, 1g L-Citrulline and Resveratrol, at the time which he did not mix with water before ingesting, but would consume with water while ingesting. He was taking this supplement to gain muscle mass and was actively trying to lose a lot of weight. He also notes abnormal amounts of stress in his life with divorce and family concerns in the setting of the last couple years. He was not smoking cigarettes at the time, was not using other drugs, and was consuming a couple drinks of alcohol a week.   The pt reports that while on Xarelto, after being discharged, he was told that his blood was still clotting but is unsure about more details of this.  He denies ever taking aspirin.   He notes that he was diagnosed with prostate cancer in 2017 and only had surgery. His incontinence is mostly resolved except when he sneezes. He notes that he has complete ED and has used Trimix to treat this. He has used Trimix as often as twice a week and is using it about once a month currently.   Most recent lab results (01/14/18) of CBC and CMP is as follows: all values are WNL except for Potassium at 3.4, Glucose at 125.   On review of systems, pt reports chronic diarrhea, occasional bloating, good energy levels, and denies chronic abdominal pains, pain along the spine, and any other symptoms.   On PMHx the pt denies pancreatitis, liver problems, lung problems, heart problems. On Social Hx the pt reports smoking a couple cigarettes from 47 y/o to 47 y/o and quit in 2008. He notes that while in the [employer] he would consume a fifth of alcohol on the weekends. He quit drinking ETOH after his SMA thrombosis. He smoked marijuana once at 47 y/o. He denies any other drug use.  On Family Hx the pt reports mom with stroke, aunt with stroke, and high blood pressure.    MEDICAL HISTORY:  Past Medical History:  Diagnosis Date  . Allergy   . Blood transfusion without reported diagnosis   . Clotting disorder ([place])   . H/O blood clots    mesenteric  . Hearing loss in right ear   . Hypertension   .  Knee pain   . Low back pain   . Prostate cancer ([place])   . Sleep apnea    on CPAP  . Small intestinal bacterial overgrowth 09/22/2017   + Breath test 08/2017  . 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[place]) 04/2017    SURGICAL HISTORY: Past Surgical History:  Procedure Laterality Date  . ABDOMINAL WOUND DEHISCENCE N/A 05/20/2017   Procedure: REPAIR OF ABDOMINAL WOUND DEHISCENCE AND EVACUATION OF HEMATOMA;  Surgeon: Angelia Mould, MD;  Location: [place];  Service: Vascular;  Laterality: N/A;  . AORTOGRAM N/A 05/15/2017   Procedure: MESENTERIC AORTOGRAM;   Surgeon: Conrad Green [place], MD;  Location: [place];  Service: Vascular;  Laterality: N/A;  . COLONOSCOPY    . GIVENS CAPSULE STUDY N/A 08/26/2017   Procedure: GIVENS CAPSULE STUDY;  Surgeon: Gatha Mayer, MD;  Location: [facility] ENDOSCOPY;  Service: Endoscopy;  Laterality: N/A;  . INGUINAL HERNIA REPAIR Left 02/21/2016   Procedure: LAPAROSCOPIC INGUINAL HERNIA;  Surgeon: Alexis Frock, MD;  Location: [facility];  Service: Urology;  Laterality: Left;  . LYMPHADENECTOMY Bilateral 02/21/2016   Procedure: PELVIC LYMPHADENECTOMY;  Surgeon: Alexis Frock, MD;  Location: [facility];  Service: Urology;  Laterality: Bilateral;  . MANDIBLE SURGERY  1990  . MESENTERIC ARTERY BYPASS N/A 05/15/2017   Procedure: OPEN FENESTRATION SUPERIOR MESENTERIC ARTERY;  Surgeon: Conrad [place], MD;  Location: [facility] OR;  Service: Vascular;  Laterality: N/A;  . PATCH ANGIOPLASTY N/A 05/15/2017   Procedure: SUPERIOR MESENTERIC ARTERY PATCH ANGIOPLASTY USING PERI-GUARD PATCH;  Surgeon: Conrad [place], MD;  Location: [place];  Service: Vascular;  Laterality: N/A;  . PERCUTANEOUS VENOUS THROMBECTOMY,LYSIS WITH INTRAVASCULAR ULTRASOUND (IVUS) N/A 05/15/2017   Procedure: THROMBECTOMY OF SUPERIOR MESENTERIC ARTERY;  Surgeon: Conrad Greenleaf, MD;  Location: [place];  Service: Vascular;  Laterality: N/A;  . ROBOT ASSISTED LAPAROSCOPIC RADICAL PROSTATECTOMY N/A 02/21/2016   Procedure: XI ROBOTIC [place];  Surgeon: Alexis Frock, MD;  Location: [facility];  Service: Urology;  Laterality: N/A;  . UMBILICAL HERNIA REPAIR  02/21/2016   Procedure: LAPAROSCOPIC UMBILICAL HERNIA;  Surgeon: Alexis Frock, MD;  Location: [facility];  Service: Urology;;  . [name]  07/29/2017   Procedure: Visceral Angiogram;  Surgeon: Conrad [place], MD;  Location: [place] [facility];  Service: Cardiovascular;;    SOCIAL HISTORY: Social History   Socioeconomic History  . Marital status: Single    Spouse name: Not on file  . Number of children:  2  . Years of education: Not on file  . Highest education level: Not on file  Occupational History  . Occupation: Sales promotion account executive  . Financial resource strain: Not on file  . Food insecurity:    Worry: Not on file    Inability: Not on file  . Transportation needs:    Medical: Not on file    Non-medical: Not on file  Tobacco Use  . Smoking status: Former Smoker    Last attempt to quit: 01/28/2007    Years since quitting: 10.9  . Smokeless tobacco: Never Used  Substance and Sexual Activity  . Alcohol use: Yes    Alcohol/week: 0.0 oz    Comment: occasional wine  . Drug use: No  . Sexual activity: Not on file  Lifestyle  . Physical activity:    Days per week: Not on file    Minutes per session: Not on file  . Stress: Not on file  Relationships  . Social connections:  Talks on phone: Not on file    Gets together: Not on file    Attends religious service: Not on file    Active member of club or organization: Not on file    Attends meetings of clubs or organizations: Not on file    Relationship status: Not on file  . Intimate partner violence:    Fear of current or ex partner: Not on file    Emotionally abused: Not on file    Physically abused: Not on file    Forced sexual activity: Not on file  Other Topics Concern  . 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 Heart disease Maternal Grandmother   . Diabetes Unknown        fhx  . Prostate cancer Father   . Hypertension Mother   . Stroke Mother   . Colon cancer Neg Hx   . Colon polyps Neg Hx   . Esophageal cancer Neg Hx   . Rectal cancer Neg Hx   . Stomach cancer Neg Hx      ALLERGIES:  has No Known Allergies.  MEDICATIONS:  Current Outpatient Medications  Medication Sig Dispense Refill  . acetaminophen (TYLENOL) 500 MG tablet Take 1,000 mg by mouth every 6 (six) hours as needed for moderate pain or headache.    Marland Kitchen amLODipine (NORVASC) 5 MG tablet Take 1 tablet (5 mg total) by mouth daily. 90 tablet 3  . cholestyramine light (PREVALITE) 4 g packet Take 1 packet (4 g total) by mouth 2 (two) times daily. Start 1 with supper and go to 2/day if needed (Patient taking differently: Take 4 g by mouth See admin instructions. Dissolve 4 grams into 2 ounces of water and drink two times a day-  to reduce elevated serum cholesterol) 60 packet 3  . Eluxadoline (VIBERZI) 100 MG TABS Take 1 tablet (100 mg total) by mouth 2 (two) times daily after a meal. (Patient not taking: Reported on 01/14/2018) 32 tablet 0  . oxyCODONE-acetaminophen (PERCOCET/ROXICET) 5-325 MG tablet Take 1-2 tablets by mouth every 6 (six) hours as needed for moderate pain or severe pain. (Patient not taking: Reported on 01/20/2018) 15 tablet 0  . tamsulosin (FLOMAX) 0.4 MG CAPS capsule Take 1 capsule (0.4 mg total) by mouth daily. 10 capsule 0   No current facility-administered medications for this visit.     REVIEW OF SYSTEMS:    10 Point review of Systems was done is negative except as noted above.  PHYSICAL EXAMINATION:  . Vitals:   01/20/18 1029  BP: (!) 135/95  Pulse: 67  Resp: 18  Temp: 98 F (36.7 C)  SpO2: 100%   Filed Weights   01/20/18 1029  Weight: 191 lb 12.8 oz (87 kg)   .Body mass index is 30.04 kg/m.  GENERAL:alert, in no acute distress and comfortable SKIN: no acute rashes, no significant lesions EYES: conjunctiva are pink and non-injected, sclera anicteric OROPHARYNX: MMM, no exudates, no oropharyngeal erythema or ulceration NECK: supple, no JVD LYMPH:  no palpable lymphadenopathy in the cervical, axillary or inguinal regions LUNGS: clear to auscultation b/l with  normal respiratory effort HEART: regular rate & rhythm ABDOMEN:  normoactive bowel sounds , non tender, not distended. Extremity: no pedal edema PSYCH: alert & oriented x 3 with fluent speech NEURO: no focal motor/sensory deficits  LABORATORY DATA:  I have reviewed the data as listed  . CBC Latest Ref Rng & Units 01/14/2018 07/29/2017 07/29/2017  WBC 4.0 - 10.5 K/uL 5.0 3.6(L) -  Hemoglobin 13.0 - 17.0 g/dL 13.7 12.1(L) 13.6  Hematocrit 39.0 - 52.0 % 42.3 37.4(L) 40.0  Platelets 150 - 400 K/uL 227 254 -    . CMP Latest Ref Rng & Units 01/14/2018 07/29/2017 07/05/2017  Glucose 70 - 99 mg/dL 125(H) 86 91  BUN 6 - 20 mg/dL 10 5(L) 5(L)  Creatinine 0.61 - 1.24 mg/dL 1.20 0.90 0.98  Sodium 135 - 145 mmol/L 139 143 140  Potassium 3.5 - 5.1 mmol/L 3.4(L) 3.4(L) 3.9  Chloride 98 - 111 mmol/L 105 102 105  CO2 22 - 32 mmol/L 23 - 27  Calcium 8.9 - 10.3 mg/dL 9.5 - 9.5  Total Protein 6.5 - 8.1 g/dL 8.1 6.4(L) 7.6  Total Bilirubin 0.3 - 1.2 mg/dL 0.9 0.8 0.5  Alkaline Phos 38 - 126 U/L 60 80 78  AST 15 - 41 U/L 32 24 23  ALT 0 - 44 U/L 44 37 37     RADIOGRAPHIC STUDIES: I have personally reviewed the radiological images as listed and agreed with the findings in the report. Dg Abd 2 Views  Result Date: 12/24/2017 CLINICAL DATA:  Mesenteric artery embolism. Mesenteric bypass/angioplasty. Follow-up CT for evaluation of foreign body in abdomen. EXAM: ABDOMEN - 2 VIEW COMPARISON:  CT 07/09/2017. FINDINGS: Surgical clips noted over the upper mid abdomen appears stable. No radiopaque foreign body noted. Soft tissues are unremarkable. No bowel distention. Stool noted throughout the colon. Small phleboliths noted. No acute bony abnormality. IMPRESSION: 1. Surgical clips noted over the upper mid abdomen. No radiopaque foreign body. 2.  No acute abnormality identified.  No bowel distention. Electronically Signed   By: Marcello Moores  Register   On: 12/24/2017 07:50   Ct Renal Stone Study  Result Date:  01/14/2018 CLINICAL DATA:  Periumbilical pain radiating to the left lower back onset this evening. EXAM: CT ABDOMEN AND PELVIS WITHOUT CONTRAST TECHNIQUE: Multidetector CT imaging of the abdomen and pelvis was performed following the standard protocol without IV contrast. COMPARISON:  07/09/2017 CTA, CT 05/14/2017 FINDINGS: Lower chest: Normal size heart without pericardial effusion. Clear lung bases. Hepatobiliary: No biliary dilatation. No space-occupying mass of the liver given limitations of a noncontrast study. No radiopaque calculi within the gallbladder. Pancreas: No pancreatic mass, ductal dilatation or inflammation. Adjacent tubular calcified structures likely represent the splenic artery. Spleen: No splenomegaly or mass. Adrenals/Urinary Tract: Normal bilateral adrenal glands. Stable right upper pole renal cyst measuring approximately 1.7 cm. Nonobstructing bilateral lower pole renal calculi, the largest is noted in the left lower pole measuring 6 x 4 x 4 mm. 1-2 mm calculi are noted in the lower pole the right kidney. Mild left-sided hydroureteronephrosis secondary to a left distal ureteral stone just proximal to the UVJ measuring 4 x 3 x 2 mm. The urinary bladder is physiologically distended without focal mural thickening. Stomach/Bowel: Small hiatal hernia. Physiologically distended stomach with normal duodenal sweep and ligament of Treitz position. No bowel obstruction or inflammation. Normal appendix. Scattered colonic diverticulosis is identified along the descending and sigmoid colon without acute diverticulitis. Vascular/Lymphatic: Nonaneurysmal abdominal aorta. No lymphadenopathy. Reproductive: Prostatectomy. Other: Periumbilical fat containing hernia.  Soft tissue ossification along the ventral mesenteric fat deep to midline ventral scarring. Findings likely represent areas of fat necrosis. Musculoskeletal: Degenerative disc disease L5-S1. IMPRESSION: 1. 4 x 3 x 2 mm calculus in the distal left  ureter causing mild left-sided hydroureteronephrosis. 2. Bilateral nonobstructing renal calculi are noted, the largest in the left lower pole measuring 6 x 4 x 4 mm. 3. Stable cyst in the upper pole of the right kidney measuring approximately 1.7 cm. 4. Colonic diverticulosis without acute diverticulitis. 5. Sagittally oriented dystrophic calcifications within the upper abdomen anteriorly compatible with fat necrosis and likely postop in etiology. 6. Small periumbilical fat containing hernia. Electronically Signed   By: Ashley Royalty M.D.   On: 01/14/2018 21:43    ASSESSMENT & PLAN:  47 y.o. male with  1. Superior mesenteric artery thrombosis on 05/14/17 PLAN -Discussed patient's most recent labs from 01/14/18, blood counts were normal including PLT at 227k, and HGB at 13.7 -Discussed that the most likely explanation is the local arterial dissection of SMA caused the downstream blood clots -Will collect blood tests today and evaluate concerns for a hypercoagulable state -Prostatectomy history with resulting erectile dysfunction and has used Trimix  -Used supplement of Citrulline, Arginine and Resveratrol in the setting of SMA thrombosis -Recommend PCP consider adding 81mg  aspirin    Labs today RTC with Dr Irene Limbo as needed    All of the patients questions were answered with apparent satisfaction. The patient knows to call the clinic with any problems, questions or concerns.  The total time spent in the appt was 45 minutes and more than 50% was on counseling and direct patient cares.    Sullivan Lone MD MS AAHIVMS [facility] Hematology/Oncology Physician [facility]  (Office):       [phone] (Work cell):  [phone] (Fax):           [phone]  01/20/2018 11:33 AM  I, Baldwin [place], am acting as a Education administrator for Dr Irene Limbo.   .I have reviewed the above documentation for accuracy and completeness, and I agree with the above. Brunetta Genera MD

## 2018-01-20 ENCOUNTER — Inpatient Hospital Stay: Payer: 59 | Attending: Hematology | Admitting: Hematology

## 2018-01-20 ENCOUNTER — Encounter: Payer: Self-pay | Admitting: Hematology

## 2018-01-20 VITALS — BP 135/95 | HR 67 | Temp 98.0°F | Resp 18 | Ht 67.0 in | Wt 191.8 lb

## 2018-01-20 DIAGNOSIS — N528 Other male erectile dysfunction: Secondary | ICD-10-CM | POA: Diagnosis not present

## 2018-01-20 DIAGNOSIS — R140 Abdominal distension (gaseous): Secondary | ICD-10-CM | POA: Insufficient documentation

## 2018-01-20 DIAGNOSIS — I10 Essential (primary) hypertension: Secondary | ICD-10-CM

## 2018-01-20 DIAGNOSIS — D6859 Other primary thrombophilia: Secondary | ICD-10-CM | POA: Insufficient documentation

## 2018-01-20 DIAGNOSIS — Z87891 Personal history of nicotine dependence: Secondary | ICD-10-CM | POA: Diagnosis not present

## 2018-01-20 DIAGNOSIS — G4730 Sleep apnea, unspecified: Secondary | ICD-10-CM | POA: Diagnosis not present

## 2018-01-20 DIAGNOSIS — R32 Unspecified urinary incontinence: Secondary | ICD-10-CM | POA: Diagnosis not present

## 2018-01-20 DIAGNOSIS — R197 Diarrhea, unspecified: Secondary | ICD-10-CM

## 2018-01-20 DIAGNOSIS — Z8546 Personal history of malignant neoplasm of prostate: Secondary | ICD-10-CM

## 2018-01-20 DIAGNOSIS — K55069 Acute infarction of intestine, part and extent unspecified: Secondary | ICD-10-CM | POA: Diagnosis not present

## 2018-01-20 DIAGNOSIS — Z9989 Dependence on other enabling machines and devices: Secondary | ICD-10-CM | POA: Diagnosis not present

## 2018-01-20 DIAGNOSIS — Z79899 Other long term (current) drug therapy: Secondary | ICD-10-CM

## 2018-01-21 ENCOUNTER — Telehealth: Payer: Self-pay | Admitting: Hematology

## 2018-01-21 NOTE — Telephone Encounter (Signed)
Called patient to schedule lab appt because he did not stop by scheduling to schedule a same day lab appt. Patients vm is not set up. Was unable to reach patient.

## 2018-01-25 NOTE — Progress Notes (Signed)
Established Mesenteric Ischemia   History of Present Illness   Marcus Pruitt is a 47 y.o. ([birth year]) male  who presents with chief complaint: resolved diarrhea  Prior procedures include: 1. Capsule endoscopy (Dr. Carlean Purl): no ischemia was noted on the capsule 2. Ao, IMA, and CA angiogram (07/29/17) demonstrating intact CA and hypertrophied IMA perfusing the intestine.  Small intestine perfusion was not visualized on that angiogram 3. TE SMA, open resection of SMA dissection flap, BPA SMA (05/15/17) for acute mesenteric ischemia  This patient's diarrhea is controlled now with Cholestyramine after two rounds of abx for small bowel bacterial overgrowth.  The patient was in the ED recently for kidney stones.  The patient recently returned to work but is easily fatigued.   The patient's PMH, PSH, SH, and FamHx were reviewed on 01/26/18 are unchanged from 12/22/17  Current Outpatient Medications  Medication Sig Dispense Refill  . acetaminophen (TYLENOL) 500 MG tablet Take 1,000 mg by mouth every 6 (six) hours as needed for moderate pain or headache.    Marland Kitchen amLODipine (NORVASC) 5 MG tablet Take 1 tablet (5 mg total) by mouth daily. 90 tablet 3  . cholestyramine light (PREVALITE) 4 g packet Take 1 packet (4 g total) by mouth 2 (two) times daily. Start 1 with supper and go to 2/day if needed (Patient taking differently: Take 4 g by mouth See admin instructions. Dissolve 4 grams into 2 ounces of water and drink two times a day-  to reduce elevated serum cholesterol) 60 packet 3  . Eluxadoline (VIBERZI) 100 MG TABS Take 1 tablet (100 mg total) by mouth 2 (two) times daily after a meal. (Patient not taking: Reported on 01/14/2018) 32 tablet 0  . oxyCODONE-acetaminophen (PERCOCET/ROXICET) 5-325 MG tablet Take 1-2 tablets by mouth every 6 (six) hours as needed for moderate pain or severe pain. (Patient not taking: Reported on 01/20/2018) 15 tablet 0  . tamsulosin (FLOMAX) 0.4 MG CAPS capsule  Take 1 capsule (0.4 mg total) by mouth daily. 10 capsule 0   No current facility-administered medications for this visit.     On ROS today: resolution of diarrhea , +kidney stone   Physical Examination   Vitals:   01/26/18 1527  BP: 134/90  Pulse: 80  Resp: 20  SpO2: 100%  Weight: 194 lb (88 kg)  Height: 5\' 7"  (1.702 m)   Body mass index is 30.38 kg/m.  General Alert, O x 3, WD, NAD  Pulmonary Sym exp, good B air movt, CTA B  Cardiac RRR, Nl S1, S2, no Murmurs, No rubs, No S3,S4  Vascular Vessel Right Left  Radial Palpable Palpable  Brachial Palpable Palpable  Carotid Palpable, No Bruit Palpable, No Bruit  Aorta Not palpable N/A  Femoral Palpable Palpable  Popliteal Not palpable Not palpable  PT Palpable Palpable  DP Palpable Palpable    Gastro- intestinal soft, non-distended, TTP in epigastrium, no obvious masses, some fascial laxity near umbilicus, No guarding or rebound, no HSM, no masses, no CVAT B, No palpable prominent aortic pulse,    Musculo- skeletal M/S 5/5 throughout  , Extremities without ischemic changes  , No edema present, No visible varicosities , No Lipodermatosclerosis present  Neurologic Pain and light touch intact in extremities , Motor exam as listed above    Medical Decision Making   Marcus Pruitt is a 47 y.o. (25-Aug-1970) male who presents with: s/p failed SMA fenestration with BPA, for AMI due to SMA dissection, chronic SMA thrombosis due to dissection,  Small intestine bacterial overgrowth, resolved chronic diarrhea and abdominal pain   I suspect this patient's fatigue at work is deconditioning after >6 months out of work, portions of it in the ICU.  I expect that this deconditioning should resolve with time.  Will have the patient follow up in one month with Dr. Scot Dock, as I will be transitioning to another practice.  Thank you for allowing [place] to participate in this patient's care.   Adele Barthel, MD, FACS [facility] [place] Office: [phone] Pager: [phone]

## 2018-01-26 ENCOUNTER — Encounter: Payer: Self-pay | Admitting: Vascular Surgery

## 2018-01-26 ENCOUNTER — Other Ambulatory Visit: Payer: Self-pay

## 2018-01-26 ENCOUNTER — Ambulatory Visit (INDEPENDENT_AMBULATORY_CARE_PROVIDER_SITE_OTHER): Payer: 59 | Admitting: Vascular Surgery

## 2018-01-26 VITALS — BP 134/90 | HR 80 | Resp 20 | Ht 67.0 in | Wt 194.0 lb

## 2018-01-26 DIAGNOSIS — K55069 Acute infarction of intestine, part and extent unspecified: Secondary | ICD-10-CM | POA: Diagnosis not present

## 2018-01-28 ENCOUNTER — Encounter: Payer: Self-pay | Admitting: Vascular Surgery

## 2018-02-01 ENCOUNTER — Encounter: Payer: Self-pay | Admitting: Internal Medicine

## 2018-02-08 ENCOUNTER — Ambulatory Visit: Payer: 59 | Admitting: Family Medicine

## 2018-02-08 ENCOUNTER — Encounter: Payer: Self-pay | Admitting: Family Medicine

## 2018-02-08 VITALS — BP 102/70 | HR 88 | Temp 98.4°F | Ht 67.0 in | Wt 192.6 lb

## 2018-02-08 DIAGNOSIS — N529 Male erectile dysfunction, unspecified: Secondary | ICD-10-CM

## 2018-02-08 DIAGNOSIS — L309 Dermatitis, unspecified: Secondary | ICD-10-CM | POA: Diagnosis not present

## 2018-02-08 DIAGNOSIS — N200 Calculus of kidney: Secondary | ICD-10-CM

## 2018-02-08 MED ORDER — TRIAMCINOLONE ACETONIDE 0.1 % EX CREA: 1.0000 "application " | TOPICAL_CREAM | Freq: Two times a day (BID) | CUTANEOUS | 2 refills | Status: DC

## 2018-02-08 NOTE — Progress Notes (Signed)
   Subjective:    Patient ID: Marcus Pruitt, male    DOB: 08-Jul-1970, 47 y.o.   MRN: [id number]  HPI Here to follow up an ER visit on 01-14-18 for kidney stones. He presented with back and flank pains with nausea. A CT revealed a left distal ureteral stone of 4 mm size, and he also had bilateral lower pole kidney stones. He was given Flomax and Percocet. He is not aware of passing the stone but all his pain has resolved and he feels fine. Second he had seen Dr. Carrie Mew at [facility] for his hx of prostate cancer and he was referred to see another [facility] partner for a possible penile implant for ED. Today Marcus Pruitt asks for a referral to get a second opinion on this. Lastly for 2 weeks he has had an itchy rash on the neck.    Review of Systems  Constitutional: Negative.   Respiratory: Negative.   Cardiovascular: Negative.   Gastrointestinal: Negative.   Genitourinary: Negative.   Skin: Positive for rash.       Objective:   Physical Exam  Constitutional: He appears well-developed and well-nourished.  Cardiovascular: Normal rate, regular rhythm, normal heart sounds and intact distal pulses.  Pulmonary/Chest: Effort normal and breath sounds normal.  Abdominal: Soft. Bowel sounds are normal. He exhibits no distension and no mass. There is no tenderness. There is no rebound and no guarding. No hernia.  Skin:  The sides and back of the neck show a macular pink scaly rash           Assessment & Plan:  He has apparently passed the ureteral stone, but he has some residual renal stones. He also wants a second opinion about a penile implant, so we will refer him to [facility] for both of these issues. He has eczema on the neck, and we will treat with Triamcinolone cream.  Alysia Penna, MD

## 2018-02-10 ENCOUNTER — Encounter: Payer: Self-pay | Admitting: Hematology

## 2018-02-14 ENCOUNTER — Ambulatory Visit (INDEPENDENT_AMBULATORY_CARE_PROVIDER_SITE_OTHER): Payer: 59 | Admitting: Vascular Surgery

## 2018-02-14 ENCOUNTER — Encounter: Payer: Self-pay | Admitting: Vascular Surgery

## 2018-02-14 VITALS — BP 133/78 | HR 76 | Temp 97.2°F | Resp 16 | Ht 67.0 in | Wt 194.0 lb

## 2018-02-14 DIAGNOSIS — K55069 Acute infarction of intestine, part and extent unspecified: Secondary | ICD-10-CM

## 2018-02-14 NOTE — Progress Notes (Signed)
Patient name: Marcus Pruitt MRN: [id number] DOB: [birth year] Sex: male  REASON FOR VISIT:   Follow-up of superior mesenteric artery occlusion.  HPI:   Marcus Pruitt is a pleasant 47 y.o. male who had presented with acute mesenteric ischemia.  On 05/15/2017 he underwent thrombectomy of the superior mesenteric artery by Dr. Adele Barthel.  He subsequently had presented with some problems with chronic diarrhea and I believe at that point Dr. Bridgett Larsson recommended an arteriogram.  The patient underwent an arteriogram on 07/29/2017 which showed the superior mesenteric artery had a bird beak appearance in the proximal artery consistent with a dissection with no filling of the proximal or mid segment consistent with thrombosis.  There were inferior mesenteric artery collaterals filled the artery distally.  The patient has been gradually getting better and his resuming his strength.  He denies any postprandial abdominal pain.  He does describe some postprandial bloating.  He has actually gained some weight.  He was on Xarelto for 2 months and this is been discontinued.  He comes in for a final follow-up visit.  He denies any significant abdominal pain.  He has been gaining some weight.  Current Outpatient Medications  Medication Sig Dispense Refill  . acetaminophen (TYLENOL) 500 MG tablet Take 1,000 mg by mouth every 6 (six) hours as needed for moderate pain or headache.    Marland Kitchen amLODipine (NORVASC) 5 MG tablet Take 1 tablet (5 mg total) by mouth daily. 90 tablet 3  . cholestyramine light (PREVALITE) 4 g packet Take 1 packet (4 g total) by mouth 2 (two) times daily. Start 1 with supper and go to 2/day if needed (Patient taking differently: Take 4 g by mouth See admin instructions. Dissolve 4 grams into 2 ounces of water and drink two times a day-  to reduce elevated serum cholesterol) 60 packet 3  . oxyCODONE-acetaminophen (PERCOCET/ROXICET) 5-325 MG tablet Take 1-2 tablets by mouth every 6 (six) hours as  needed for moderate pain or severe pain. 15 tablet 0  . triamcinolone cream (KENALOG) 0.1 % Apply 1 application topically 2 (two) times daily. 45 g 2   No current facility-administered medications for this visit.     REVIEW OF SYSTEMS:  [X]  denotes positive finding, [ ]  denotes negative finding Vascular    Leg swelling    Cardiac    Chest pain or chest pressure:    Shortness of breath upon exertion:    Short of breath when lying flat:    Irregular heart rhythm:    Constitutional    Fever or chills:     PHYSICAL EXAM:   Vitals:   02/14/18 1254  BP: 133/78  Pulse: 76  Resp: 16  Temp: (!) 97.2 F (36.2 C)  SpO2: 99%  Weight: 194 lb (88 kg)  Height: 5\' 7"  (1.702 m)    GENERAL: The patient is a well-nourished male, in no acute distress. The vital signs are documented above. CARDIOVASCULAR: There is a regular rate and rhythm. PULMONARY: There is good air exchange bilaterally without wheezing or rales. VASCULAR: I do not detect carotid bruits. He has palpable femoral pulses and pedal pulses bilaterally. His abdominal incision is healed nicely.  DATA:   No new data  MEDICAL ISSUES:   OCCLUSION OF THE SUPERIOR MESENTERIC ARTERY: Patient is doing well and his symptoms have gradually resolved.  His surgery was in November.  He did have a dehiscence which required repair.  I think it would be reasonable for him to return  to work on September 1 with no lifting over 15 pounds for 2 more months.  We will be happy to see him back at any time if any new symptoms arise.  Deitra Mayo [facility] [phone]

## 2018-02-21 DIAGNOSIS — Z01818 Encounter for other preprocedural examination: Secondary | ICD-10-CM | POA: Diagnosis not present

## 2018-03-02 ENCOUNTER — Ambulatory Visit: Payer: 59 | Admitting: Vascular Surgery

## 2018-03-06 DIAGNOSIS — R868 Other abnormal findings in specimens from male genital organs: Secondary | ICD-10-CM | POA: Insufficient documentation

## 2018-03-06 DIAGNOSIS — N538 Other male sexual dysfunction: Secondary | ICD-10-CM | POA: Insufficient documentation

## 2018-03-07 DIAGNOSIS — K909 Intestinal malabsorption, unspecified: Secondary | ICD-10-CM | POA: Diagnosis not present

## 2018-03-07 DIAGNOSIS — R197 Diarrhea, unspecified: Secondary | ICD-10-CM | POA: Diagnosis not present

## 2018-03-31 DIAGNOSIS — Z8546 Personal history of malignant neoplasm of prostate: Secondary | ICD-10-CM | POA: Insufficient documentation

## 2018-03-31 DIAGNOSIS — N393 Stress incontinence (female) (male): Secondary | ICD-10-CM | POA: Insufficient documentation

## 2018-03-31 DIAGNOSIS — N486 Induration penis plastica: Secondary | ICD-10-CM | POA: Diagnosis not present

## 2018-03-31 DIAGNOSIS — N209 Urinary calculus, unspecified: Secondary | ICD-10-CM | POA: Insufficient documentation

## 2018-03-31 DIAGNOSIS — E6609 Other obesity due to excess calories: Secondary | ICD-10-CM | POA: Insufficient documentation

## 2018-03-31 DIAGNOSIS — N281 Cyst of kidney, acquired: Secondary | ICD-10-CM | POA: Insufficient documentation

## 2018-03-31 DIAGNOSIS — Z6830 Body mass index (BMI) 30.0-30.9, adult: Secondary | ICD-10-CM

## 2018-04-22 ENCOUNTER — Encounter: Payer: Self-pay | Admitting: Family Medicine

## 2018-04-22 DIAGNOSIS — M25572 Pain in left ankle and joints of left foot: Principal | ICD-10-CM

## 2018-04-22 DIAGNOSIS — G8929 Other chronic pain: Secondary | ICD-10-CM

## 2018-04-25 NOTE — Telephone Encounter (Signed)
Dr Sarajane Jews please advise of the referral for podiatry. Thanks

## 2018-04-27 NOTE — Telephone Encounter (Signed)
The referral was done  

## 2018-05-03 DIAGNOSIS — N529 Male erectile dysfunction, unspecified: Secondary | ICD-10-CM | POA: Diagnosis not present

## 2018-05-03 DIAGNOSIS — I10 Essential (primary) hypertension: Secondary | ICD-10-CM | POA: Diagnosis not present

## 2018-05-03 DIAGNOSIS — N486 Induration penis plastica: Secondary | ICD-10-CM | POA: Diagnosis not present

## 2018-05-17 ENCOUNTER — Ambulatory Visit (INDEPENDENT_AMBULATORY_CARE_PROVIDER_SITE_OTHER): Payer: 59 | Admitting: Podiatry

## 2018-05-17 ENCOUNTER — Other Ambulatory Visit: Payer: Self-pay | Admitting: Podiatry

## 2018-05-17 ENCOUNTER — Ambulatory Visit (INDEPENDENT_AMBULATORY_CARE_PROVIDER_SITE_OTHER): Payer: 59

## 2018-05-17 VITALS — BP 141/85 | HR 78

## 2018-05-17 DIAGNOSIS — M2141 Flat foot [pes planus] (acquired), right foot: Secondary | ICD-10-CM | POA: Diagnosis not present

## 2018-05-17 DIAGNOSIS — S93402A Sprain of unspecified ligament of left ankle, initial encounter: Secondary | ICD-10-CM | POA: Diagnosis not present

## 2018-05-17 DIAGNOSIS — M25572 Pain in left ankle and joints of left foot: Secondary | ICD-10-CM

## 2018-05-17 DIAGNOSIS — G8929 Other chronic pain: Secondary | ICD-10-CM

## 2018-05-17 DIAGNOSIS — M79672 Pain in left foot: Secondary | ICD-10-CM

## 2018-05-17 DIAGNOSIS — S93402S Sprain of unspecified ligament of left ankle, sequela: Secondary | ICD-10-CM

## 2018-05-17 DIAGNOSIS — M2142 Flat foot [pes planus] (acquired), left foot: Secondary | ICD-10-CM

## 2018-05-19 NOTE — Progress Notes (Signed)
Subjective:   Patient ID: Marcus Pruitt, male   DOB: 47 y.o.   MRN: [id number]   HPI 47 year old male presents the office today for concerns of pain to the left ankle.  He states he has pain mildly on the inside aspect he gets most of pain to the also aspect the ankle.  He states he had a possible fracture to the left ankle back in 2016.  That time he has been to a brace treatment he was doing better but since then he has had some ongoing intermittent discomfort in his work of breathing on his feet for some time.  He denies any recent injury or trauma to his feet denies any significant redness.  Some occasional swelling to the lateral ankle he reports.  He has seen 2 other doctors and he states that he is not a surgical candidate.  He does get orthotics that the [place] but is been a couple years and was given a new pair.  He has no other concerns.   Review of Systems  All other systems reviewed and are negative.  Past Medical History:  Diagnosis Date  . Allergy   . Blood transfusion without reported diagnosis   . Clotting disorder ([place])   . H/O blood clots    mesenteric  . Hearing loss in right ear   . Hypertension   . Knee pain   . Low back pain   . Prostate cancer ([place])   . Sleep apnea    on CPAP  . Small intestinal bacterial overgrowth 09/22/2017   + Breath test 08/2017  . 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 ABDOMINAL WOUND DEHISCENCE N/A 05/20/2017   Procedure: REPAIR OF ABDOMINAL WOUND DEHISCENCE AND EVACUATION OF HEMATOMA;  Surgeon: Angelia Mould, MD;  Location: [place];  Service: Vascular;  Laterality: N/A;  . AORTOGRAM N/A 05/15/2017   Procedure: MESENTERIC AORTOGRAM;  Surgeon: Conrad Chubbuck, MD;  Location: [place];  Service: Vascular;  Laterality: N/A;  . COLONOSCOPY    . GIVENS CAPSULE STUDY N/A 08/26/2017   Procedure: GIVENS CAPSULE STUDY;  Surgeon: Gatha Mayer, MD;  Location: [facility];   Service: Endoscopy;  Laterality: N/A;  . INGUINAL HERNIA REPAIR Left 02/21/2016   Procedure: LAPAROSCOPIC INGUINAL HERNIA;  Surgeon: Alexis Frock, MD;  Location: [facility];  Service: Urology;  Laterality: Left;  . LYMPHADENECTOMY Bilateral 02/21/2016   Procedure: PELVIC LYMPHADENECTOMY;  Surgeon: Alexis Frock, MD;  Location: [facility];  Service: Urology;  Laterality: Bilateral;  . MANDIBLE SURGERY  1990  . MESENTERIC ARTERY BYPASS N/A 05/15/2017   Procedure: OPEN FENESTRATION SUPERIOR MESENTERIC ARTERY;  Surgeon: Conrad Alta Sierra, MD;  Location: [facility] OR;  Service: Vascular;  Laterality: N/A;  . PATCH ANGIOPLASTY N/A 05/15/2017   Procedure: SUPERIOR MESENTERIC ARTERY PATCH ANGIOPLASTY USING PERI-GUARD PATCH;  Surgeon: Conrad Redmond, MD;  Location: [place];  Service: Vascular;  Laterality: N/A;  . PERCUTANEOUS VENOUS THROMBECTOMY,LYSIS WITH INTRAVASCULAR ULTRASOUND (IVUS) N/A 05/15/2017   Procedure: THROMBECTOMY OF SUPERIOR MESENTERIC ARTERY;  Surgeon: Conrad , MD;  Location: [place];  Service: Vascular;  Laterality: N/A;  . ROBOT ASSISTED LAPAROSCOPIC RADICAL PROSTATECTOMY N/A 02/21/2016   Procedure: XI ROBOTIC [place];  Surgeon: Alexis Frock, MD;  Location: [facility];  Service: Urology;  Laterality: N/A;  . UMBILICAL HERNIA REPAIR  02/21/2016   Procedure: LAPAROSCOPIC UMBILICAL HERNIA;  Surgeon: Alexis Frock, MD;  Location: [facility];  Service: Urology;;  . [name]  07/29/2017   Procedure: Visceral Angiogram;  Surgeon: Conrad Linden, MD;  Location: [place] [facility];  Service: Cardiovascular;;     Current Outpatient Medications:  .  acetaminophen (TYLENOL) 500 MG tablet, Take 1,000 mg by mouth every 6 (six) hours as needed for moderate pain or headache., Disp: , Rfl:  .  amLODipine (NORVASC) 5 MG tablet, Take 1 tablet (5 mg total) by mouth daily., Disp: 90 tablet, Rfl: 3 .  cholestyramine light (PREVALITE) 4 g packet, Take 1 packet (4 g total) by mouth 2  (two) times daily. Start 1 with supper and go to 2/day if needed (Patient taking differently: Take 4 g by mouth See admin instructions. Dissolve 4 grams into 2 ounces of water and drink two times a day-  to reduce elevated serum cholesterol), Disp: 60 packet, Rfl: 3 .  oxyCODONE-acetaminophen (PERCOCET/ROXICET) 5-325 MG tablet, Take 1-2 tablets by mouth every 6 (six) hours as needed for moderate pain or severe pain., Disp: 15 tablet, Rfl: 0 .  traMADol (ULTRAM) 50 MG tablet, TK 2 TS PO Q 6 H PRF UP TO 5 DAYS, Disp: , Rfl: 0 .  triamcinolone cream (KENALOG) 0.1 %, Apply 1 application topically 2 (two) times daily., Disp: 45 g, Rfl: 2  No Known Allergies      Objective:  Physical Exam  General: AAO x3, NAD  Dermatological: Skin is warm, dry and supple bilateral. Nails x 10 are well manicured; remaining integument appears unremarkable at this time. There are no open sores, no preulcerative lesions, no rash or signs of infection present.  Vascular: Dorsalis Pedis artery and Posterior Tibial artery pedal pulses are 2/4 bilateral with immedate capillary fill time. Pedal hair growth present. No varicosities and no lower extremity edema present bilateral. There is no pain with calf compression, swelling, warmth, erythema.   Neruologic: Grossly intact via light touch bilateral. Protective threshold with Semmes Wienstein monofilament intact to all pedal sites bilateral.   Musculoskeletal: Mild edema to the lateral aspect left ankle there is mild tenderness palpation to the ATFL.  There is no significant discomfort of the CFL, PTFL, syndesmosis, deltoid ligaments.  No pain to the foot.  Mild increase in anterior drawer on the left side compared to contralateral extremity.  Muscular strength 5/5 in all groups tested bilateral.  Flatfoot deformities present.  Gait: Unassisted, Nonantalgic.       Assessment:   Left chronic ankle sprain, flatfoot deformity     Plan:  -Treatment options discussed  including all alternatives, risks, and complications -Etiology of symptoms were discussed -X-rays were obtained and reviewed with the patient.  Evidence of old fracture of the distal fibula but no evidence of acute fracture identified today. -I do think a benefit from new orthotics.  Went to a prescription through the [place] -We discussed physical therapy to help strengthen the ankle.  This was written for [facility] today.  Discussed supportive shoes.  Trula Slade DPM

## 2018-05-25 ENCOUNTER — Other Ambulatory Visit: Payer: Self-pay

## 2018-05-25 MED ORDER — CHOLESTYRAMINE LIGHT 4 G PO PACK: 4.0000 g | PACK | ORAL | 5 refills | Status: DC

## 2018-05-25 NOTE — Telephone Encounter (Signed)
Cholestyramine refilled as pharmacy requested. Faxed rx to [facility] at fax # [phone].

## 2018-06-01 ENCOUNTER — Telehealth: Payer: Self-pay | Admitting: Podiatry

## 2018-06-01 NOTE — Telephone Encounter (Signed)
Sharyn Lull Dillion from [facility] left message stating [facility] was [facility] referred [facility] in 2014 and that authorization has expired.  I notified patient and he will call and see what he needs to do and let us know.

## 2018-06-24 DIAGNOSIS — Z8546 Personal history of malignant neoplasm of prostate: Secondary | ICD-10-CM | POA: Diagnosis not present

## 2018-07-10 ENCOUNTER — Encounter: Payer: Self-pay | Admitting: Family Medicine

## 2018-07-10 DIAGNOSIS — C61 Malignant neoplasm of prostate: Secondary | ICD-10-CM

## 2018-07-11 NOTE — Telephone Encounter (Signed)
Dr. Sarajane Jews please advise on second opinion referral.  thanks

## 2018-07-13 NOTE — Telephone Encounter (Signed)
The referral was done  

## 2018-07-18 ENCOUNTER — Telehealth: Payer: Self-pay | Admitting: *Deleted

## 2018-07-18 DIAGNOSIS — M2141 Flat foot [pes planus] (acquired), right foot: Secondary | ICD-10-CM

## 2018-07-18 DIAGNOSIS — S93402S Sprain of unspecified ligament of left ankle, sequela: Secondary | ICD-10-CM

## 2018-07-18 DIAGNOSIS — M2142 Flat foot [pes planus] (acquired), left foot: Secondary | ICD-10-CM

## 2018-07-18 NOTE — Telephone Encounter (Signed)
Marcus Pruitt [facility] - In-office states pt was schedule in 04/2018, but unable to attend and they need a new rx. Dr. Jacqualyn Posey okay renewal of PT.

## 2018-07-22 DIAGNOSIS — M25572 Pain in left ankle and joints of left foot: Secondary | ICD-10-CM | POA: Diagnosis not present

## 2018-07-22 DIAGNOSIS — M62572 Muscle wasting and atrophy, not elsewhere classified, left ankle and foot: Secondary | ICD-10-CM | POA: Diagnosis not present

## 2018-07-22 DIAGNOSIS — M62562 Muscle wasting and atrophy, not elsewhere classified, left lower leg: Secondary | ICD-10-CM | POA: Diagnosis not present

## 2018-07-26 DIAGNOSIS — M62572 Muscle wasting and atrophy, not elsewhere classified, left ankle and foot: Secondary | ICD-10-CM | POA: Diagnosis not present

## 2018-07-26 DIAGNOSIS — M25572 Pain in left ankle and joints of left foot: Secondary | ICD-10-CM | POA: Diagnosis not present

## 2018-07-26 DIAGNOSIS — M62562 Muscle wasting and atrophy, not elsewhere classified, left lower leg: Secondary | ICD-10-CM | POA: Diagnosis not present

## 2018-08-12 DIAGNOSIS — N486 Induration penis plastica: Secondary | ICD-10-CM | POA: Diagnosis not present

## 2018-09-16 ENCOUNTER — Ambulatory Visit (INDEPENDENT_AMBULATORY_CARE_PROVIDER_SITE_OTHER): Payer: 59 | Admitting: Family Medicine

## 2018-09-16 ENCOUNTER — Other Ambulatory Visit: Payer: Self-pay

## 2018-09-16 VITALS — BP 120/88 | HR 73 | Temp 98.9°F

## 2018-09-16 DIAGNOSIS — J069 Acute upper respiratory infection, unspecified: Secondary | ICD-10-CM

## 2018-09-18 ENCOUNTER — Encounter: Payer: Self-pay | Admitting: Family Medicine

## 2018-09-18 NOTE — Progress Notes (Signed)
   Subjective:    Patient ID: Marcus Pruitt, male    DOB: 05/01/1971, 48 y.o.   MRN: [id number]  HPI Here for 2 days of headaches,sinus pressure, PND, and dry cough. No fever or body aches. He had been I [place] 5 days ago for a funeral.    Review of Systems  Constitutional: Negative.   HENT: Positive for congestion, postnasal drip and rhinorrhea. Negative for sore throat.   Eyes: Negative.   Respiratory: Positive for cough.   Neurological: Positive for headaches.       Objective:   Physical Exam Constitutional:      Appearance: Normal appearance. He is not ill-appearing.  HENT:     Right Ear: Tympanic membrane and ear canal normal.     Left Ear: Tympanic membrane and ear canal normal.     Nose: Nose normal.     Mouth/Throat:     Pharynx: Oropharynx is clear.  Eyes:     Conjunctiva/sclera: Conjunctivae normal.  Pulmonary:     Effort: Pulmonary effort is normal. No respiratory distress.     Breath sounds: Normal breath sounds. No stridor. No wheezing, rhonchi or rales.  Lymphadenopathy:     Cervical: No cervical adenopathy.  Neurological:     Mental Status: He is alert.           Assessment & Plan:  Viral URI. Drink fluids. Take Delsym prn.  Alysia Penna, MD

## 2018-10-31 IMAGING — CR DG CHEST 2V
2 series · 2 of 2 positions shown · non-contrast
Comparison: None.

CLINICAL DATA: Chest pain

EXAM:
CHEST  2 VIEW

[w chest pa]
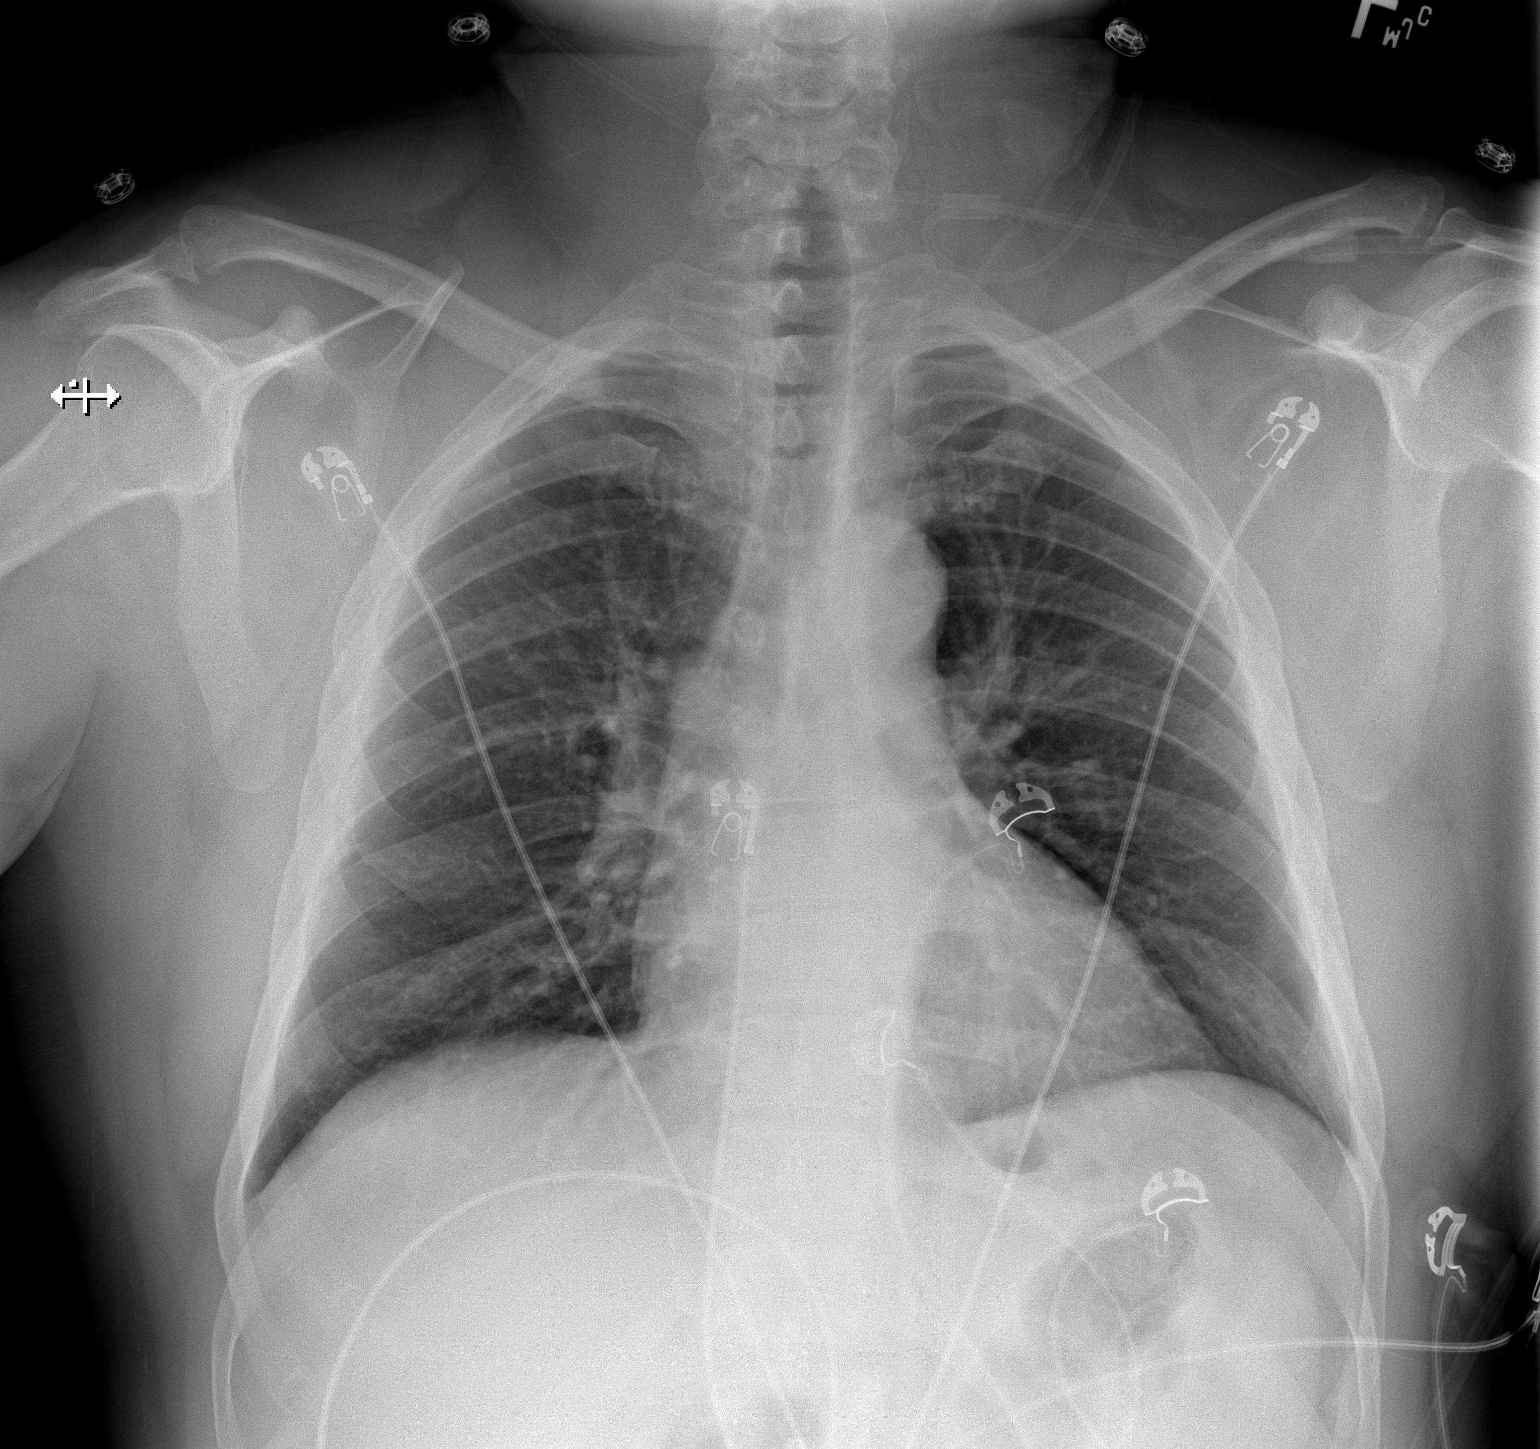

[w chest lat]
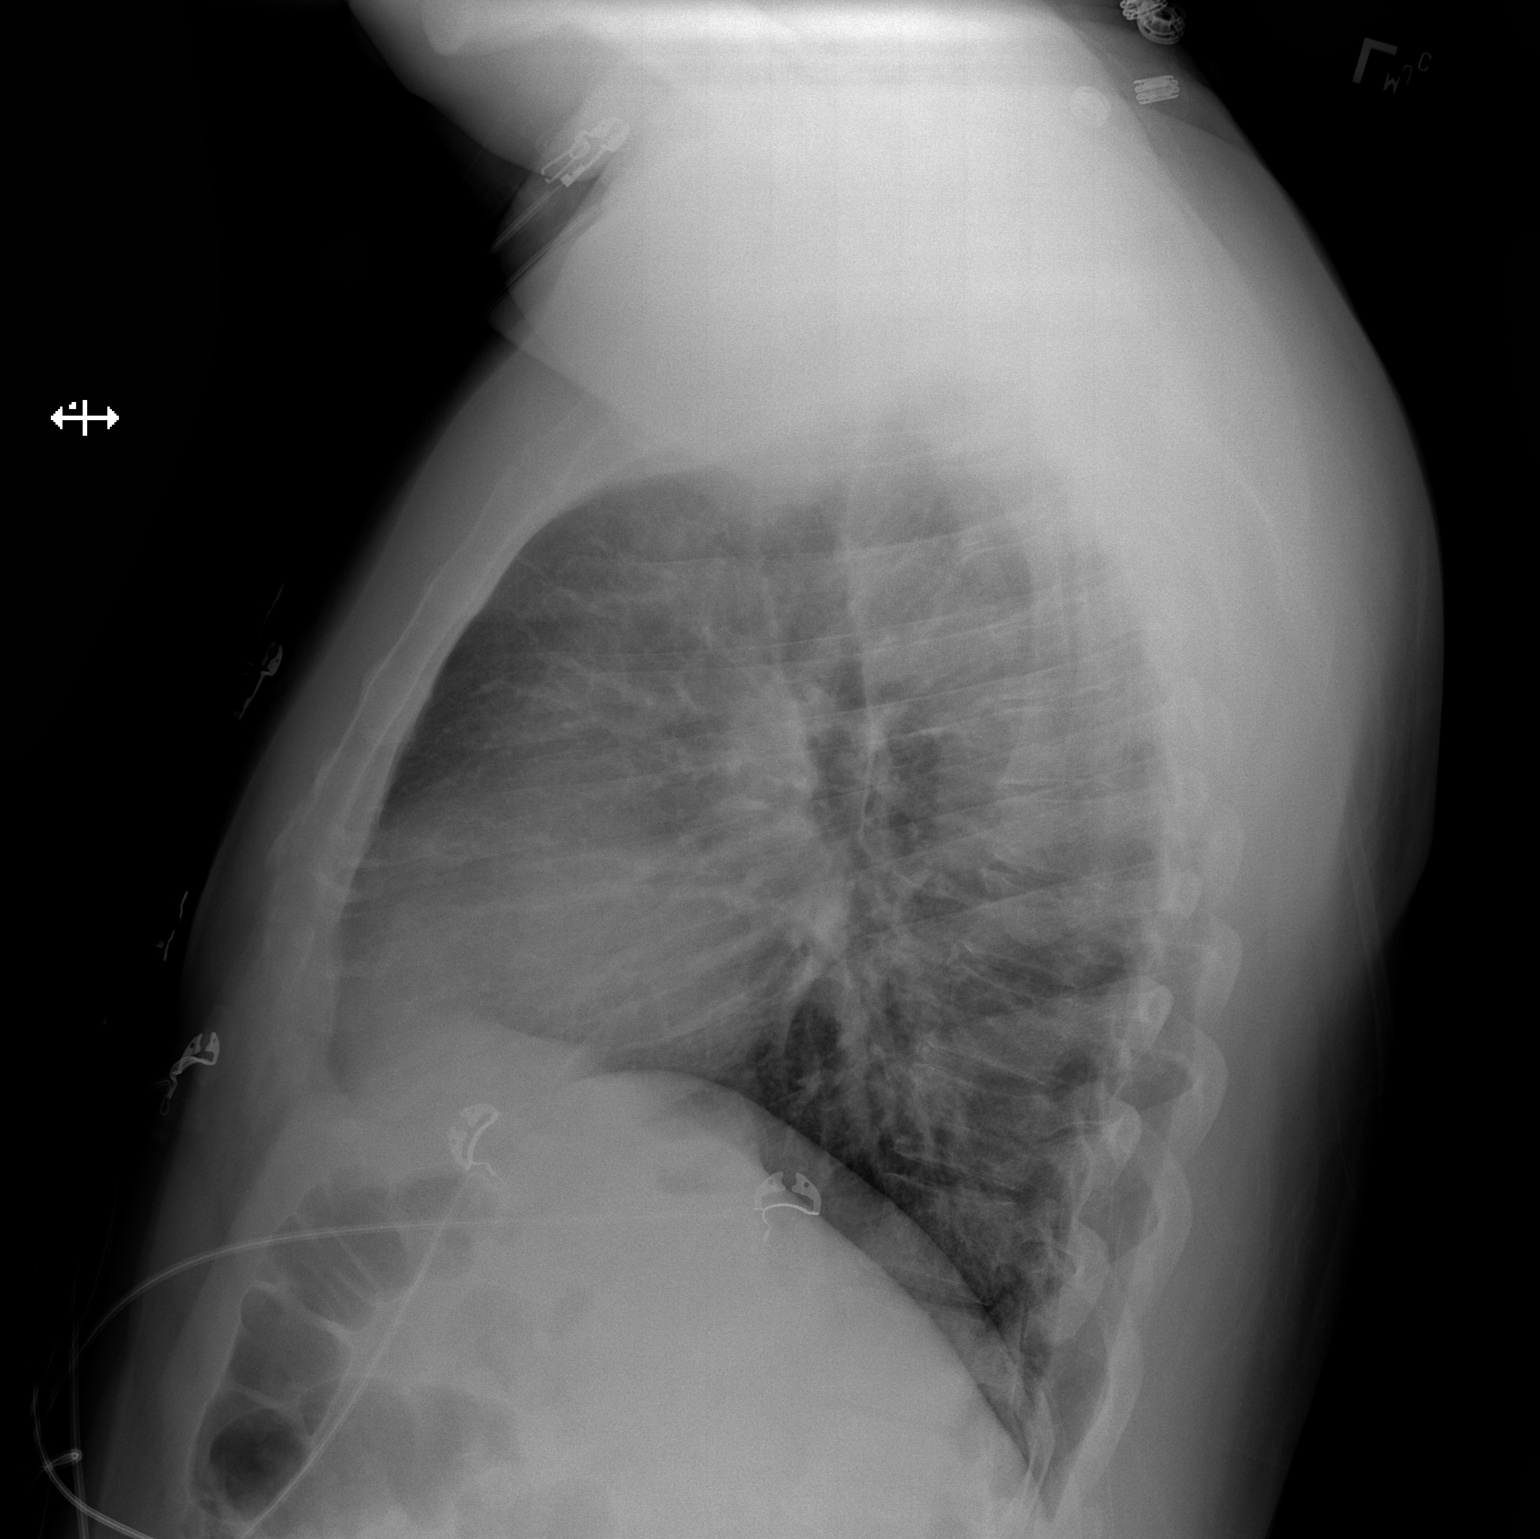

[2 of 2 positions shown; findings below may reference images not displayed]

FINDINGS: The heart size and mediastinal contours are within normal limits.
Both lungs are clear. The visualized skeletal structures are
unremarkable.
IMPRESSION: No active cardiopulmonary disease.

## 2018-11-02 IMAGING — CR DG CHEST 1V PORT
1 series · 1 of 1 positions shown · non-contrast
Comparison: 05/15/2017

CLINICAL DATA: Respiratory failure

EXAM:
PORTABLE CHEST 1 VIEW

[AP]
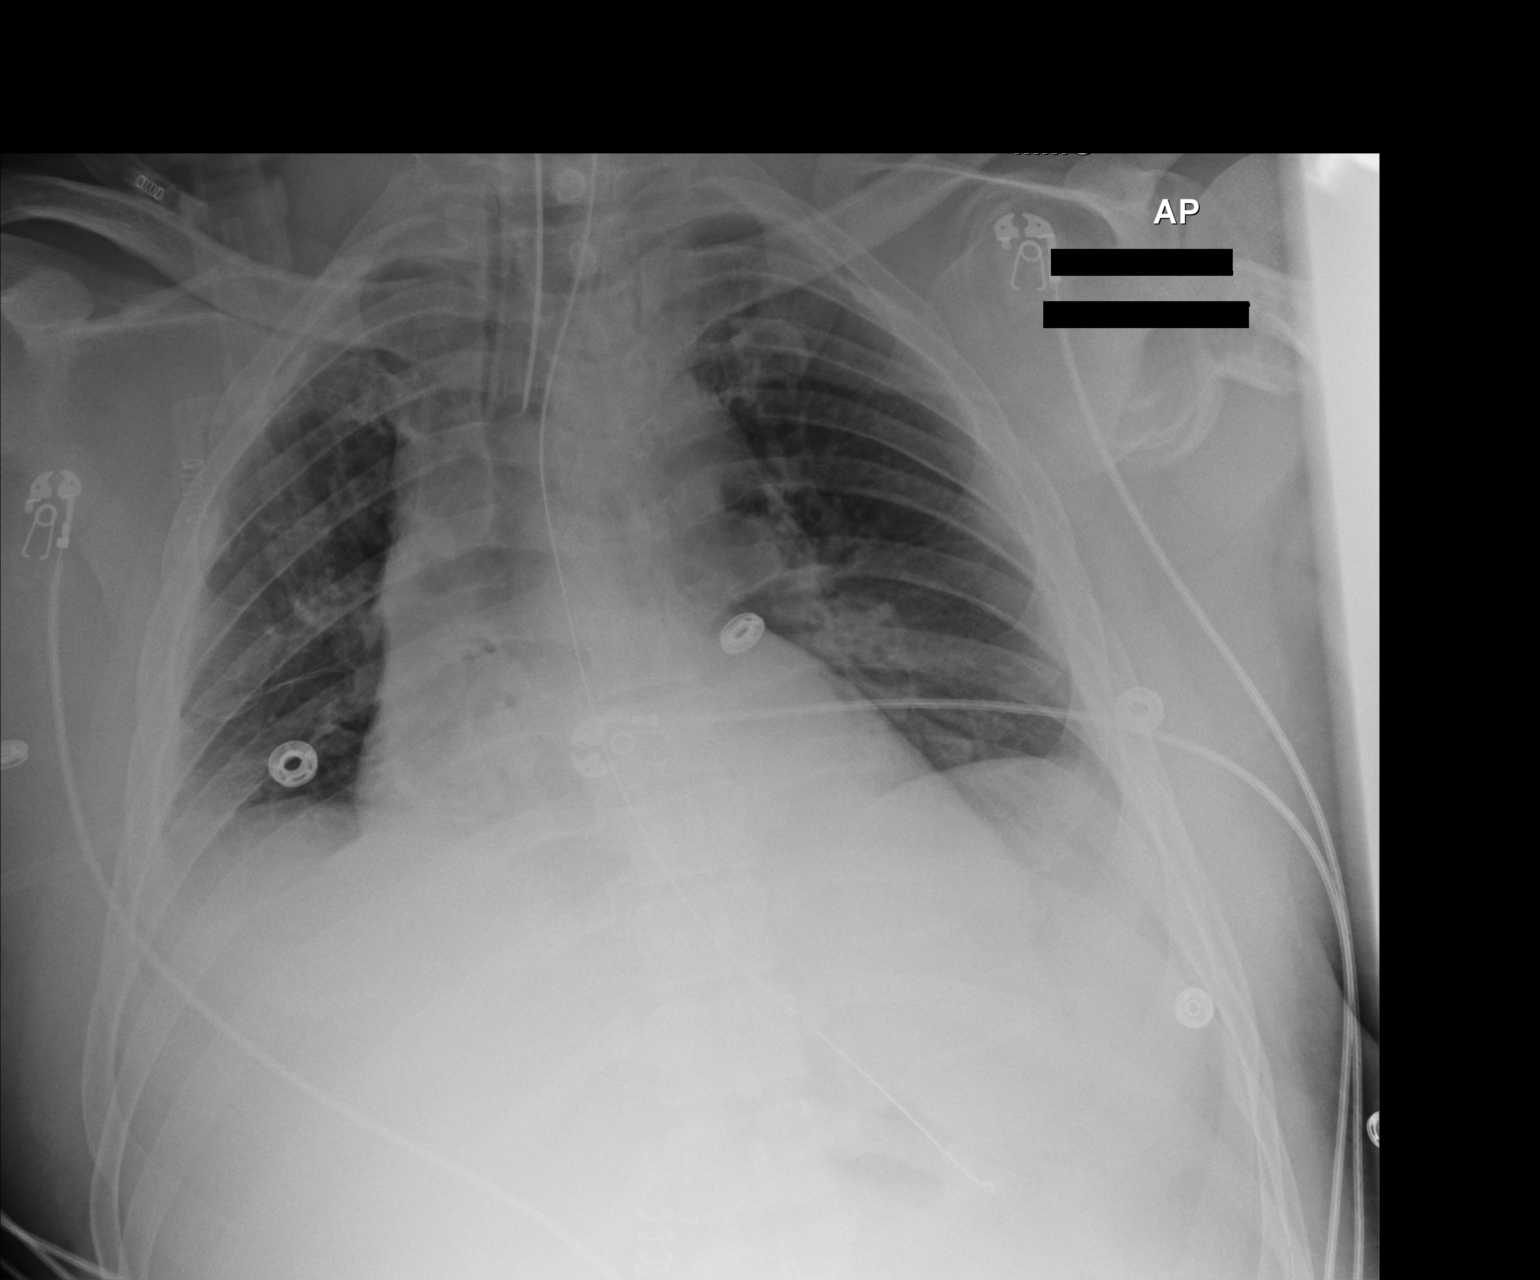

[1 of 1 positions shown; findings below may reference images not displayed]

FINDINGS: Endotracheal tube and NG tube are stable. Low lung volumes with
bibasilar atelectasis and mild vascular congestion. Improving
aeration at the left base since prior study. Question small
effusions.
IMPRESSION: Continued low lung volumes with improving aeration at the left base.
Mild residual bibasilar atelectasis. Vascular congestion.

## 2018-11-03 IMAGING — DX DG ABD PORTABLE 1V
1 series · 1 of 1 positions shown · non-contrast
Comparison: Abdominal radiograph dated 05/15/2017

CLINICAL DATA: 46-year-old male status post enteric tube placement.

EXAM:
PORTABLE ABDOMEN - 1 VIEW

[abdomen kub]
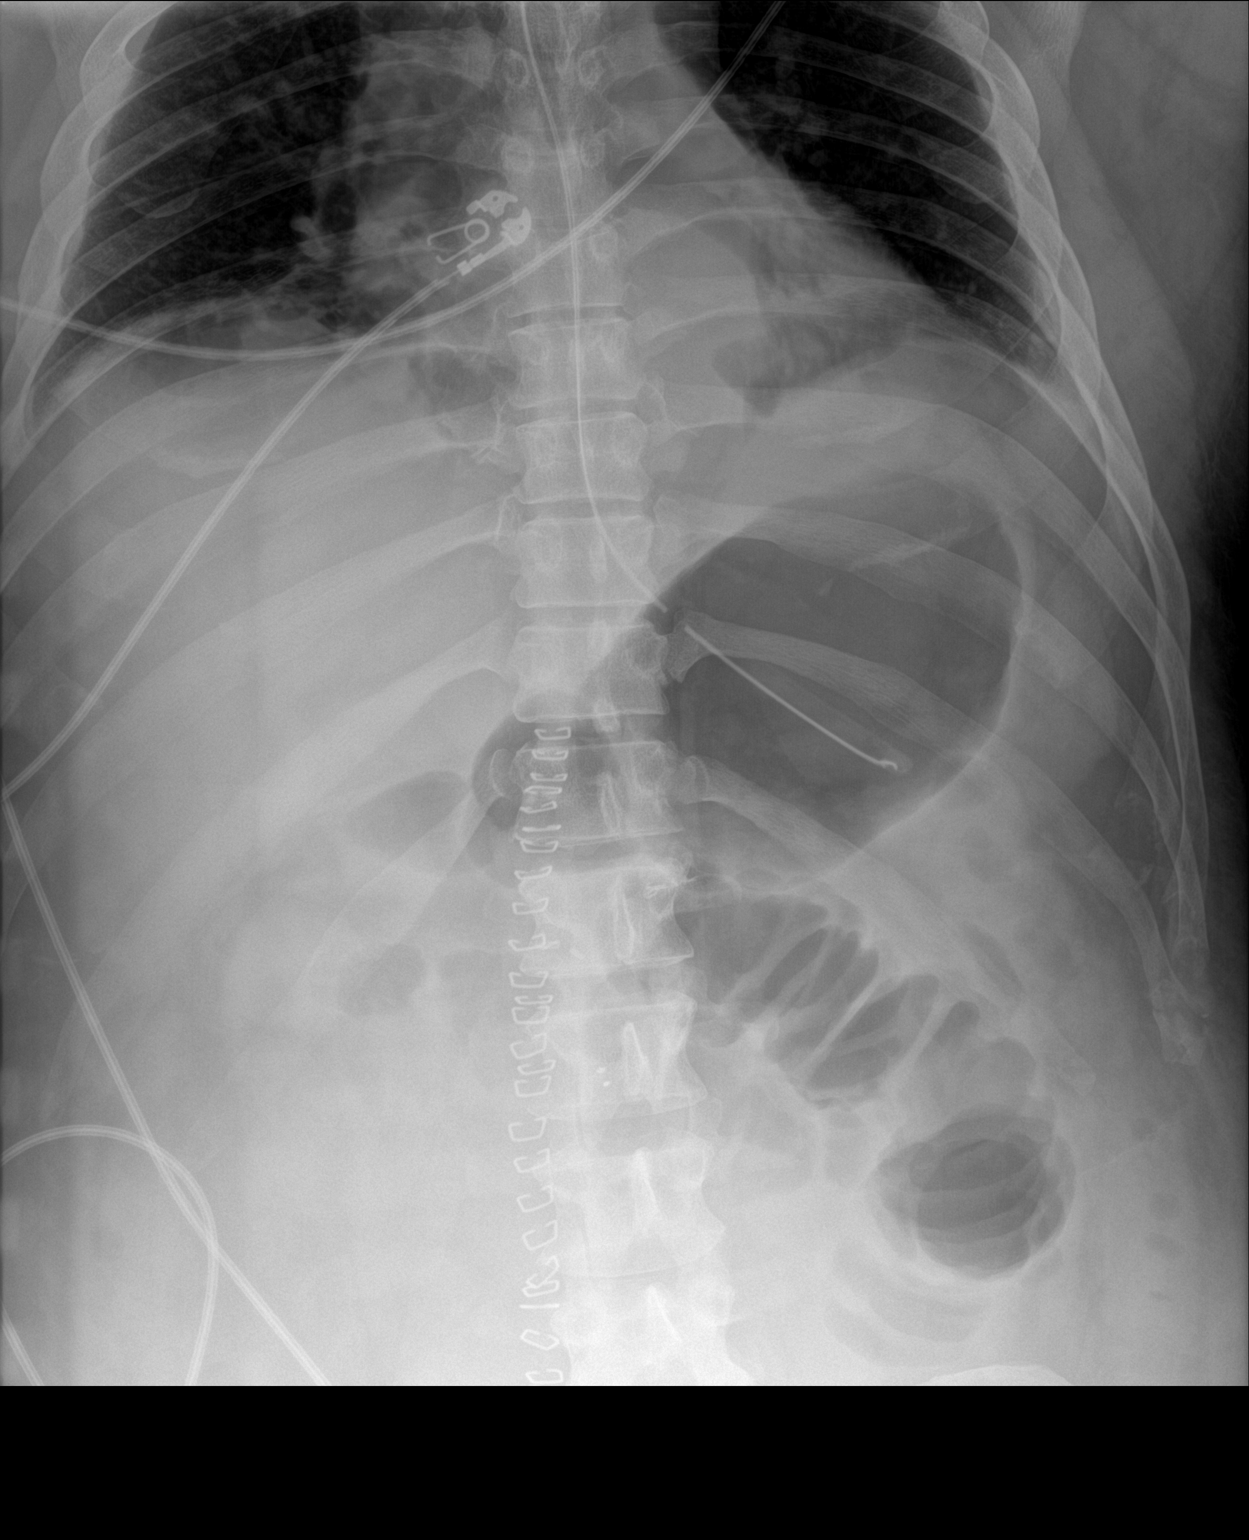

[1 of 1 positions shown; findings below may reference images not displayed]

FINDINGS: An enteric tube is partially visualized with side-port distal to the
gastroesophageal junction and tip in the proximal stomach. A dilated
loop of small bowel in the left mid abdomen measures approximately
4.5 cm in diameter. Midline vertical anterior abdominal wall
cutaneous surgical clips noted.
IMPRESSION: 1. Enteric tube with tip in the proximal stomach.
2. Dilated loop of small bowel in the left mid abdomen measuring
cm.

## 2018-11-03 IMAGING — CR DG CHEST 1V PORT
1 series · 1 of 1 positions shown · non-contrast
Comparison: 05/16/2017

CLINICAL DATA: Endotracheal tube.  Postop abdominal surgery

EXAM:
PORTABLE CHEST 1 VIEW

[AP]
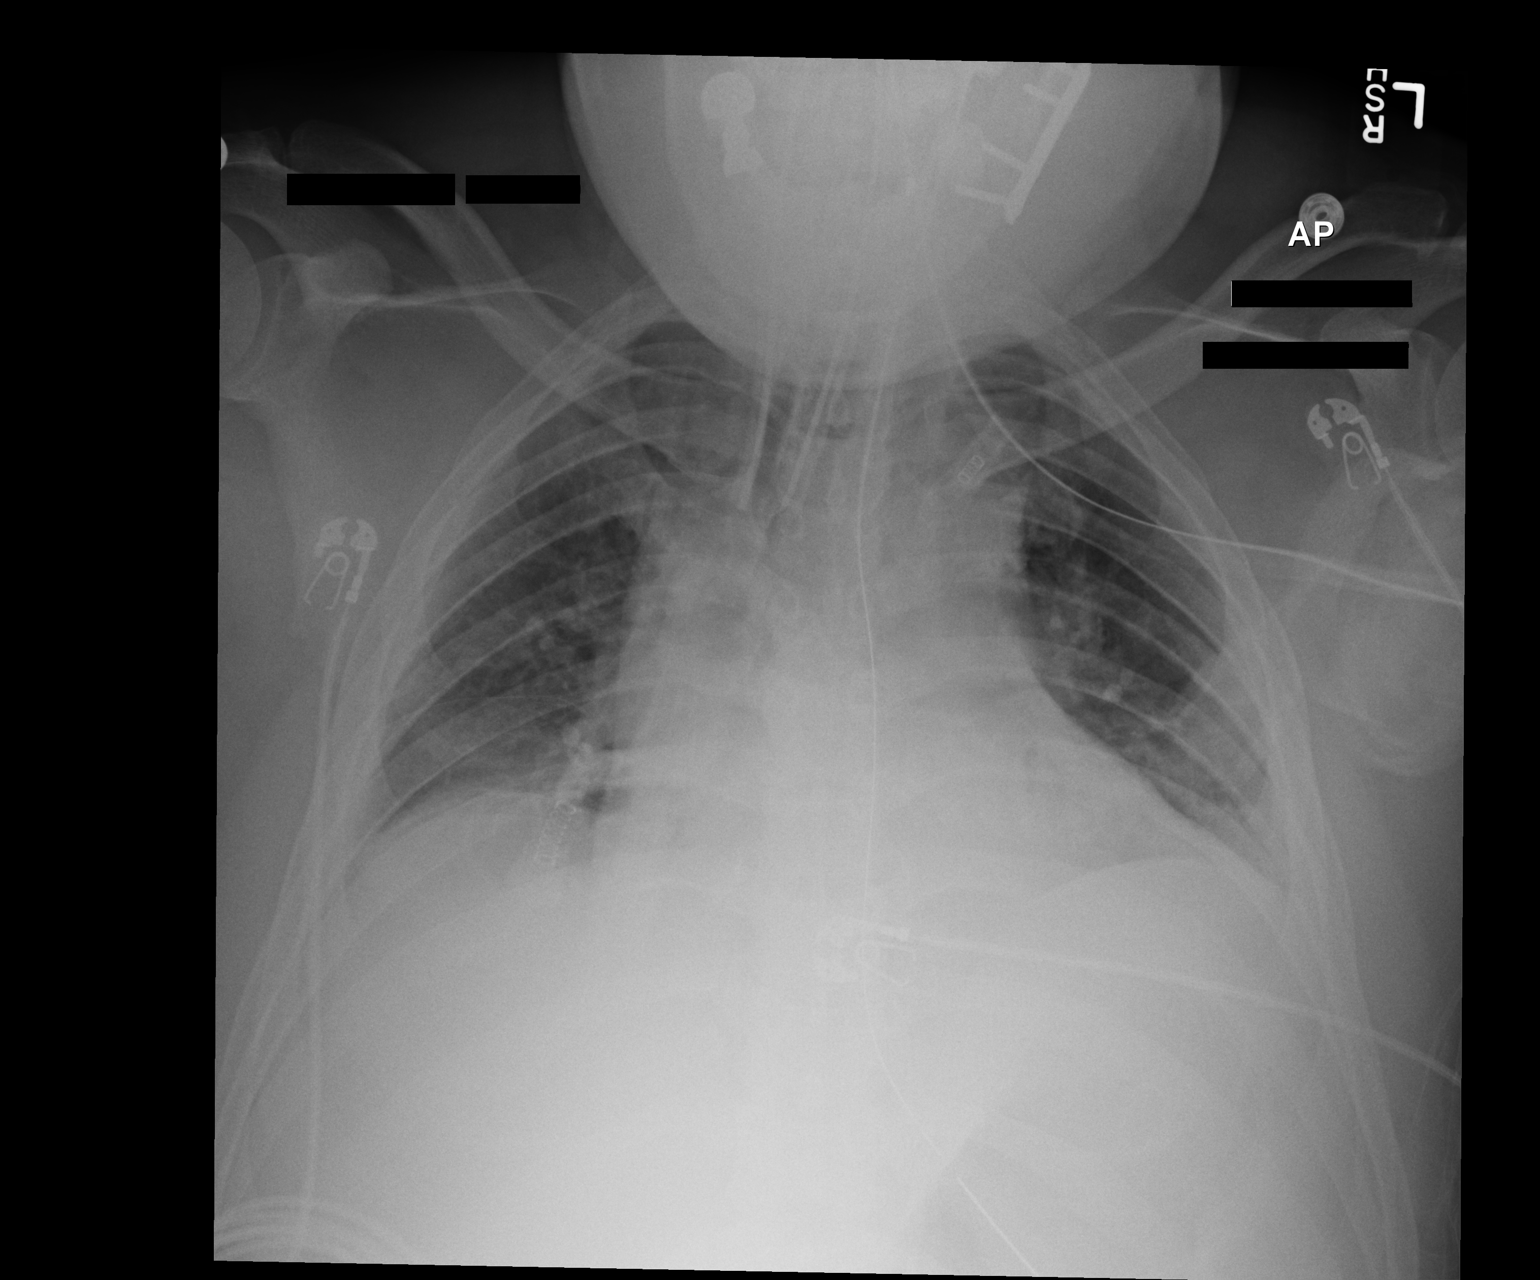

[1 of 1 positions shown; findings below may reference images not displayed]

FINDINGS: Endotracheal tube in good position.  NG tube in the stomach

Decreased lung volume since the prior study. Progression of
bibasilar atelectasis. Negative for edema or effusion
IMPRESSION: Hypoventilation with worsening bibasilar atelectasis.

## 2018-11-04 ENCOUNTER — Other Ambulatory Visit: Payer: Self-pay | Admitting: Family Medicine

## 2018-11-04 IMAGING — DX DG ABD PORTABLE 1V
1 series · 1 of 1 positions shown · non-contrast
Comparison: 05/17/2017

CLINICAL DATA: Check nasogastric catheter placement

EXAM:
PORTABLE ABDOMEN - 1 VIEW

[abdomen kub]
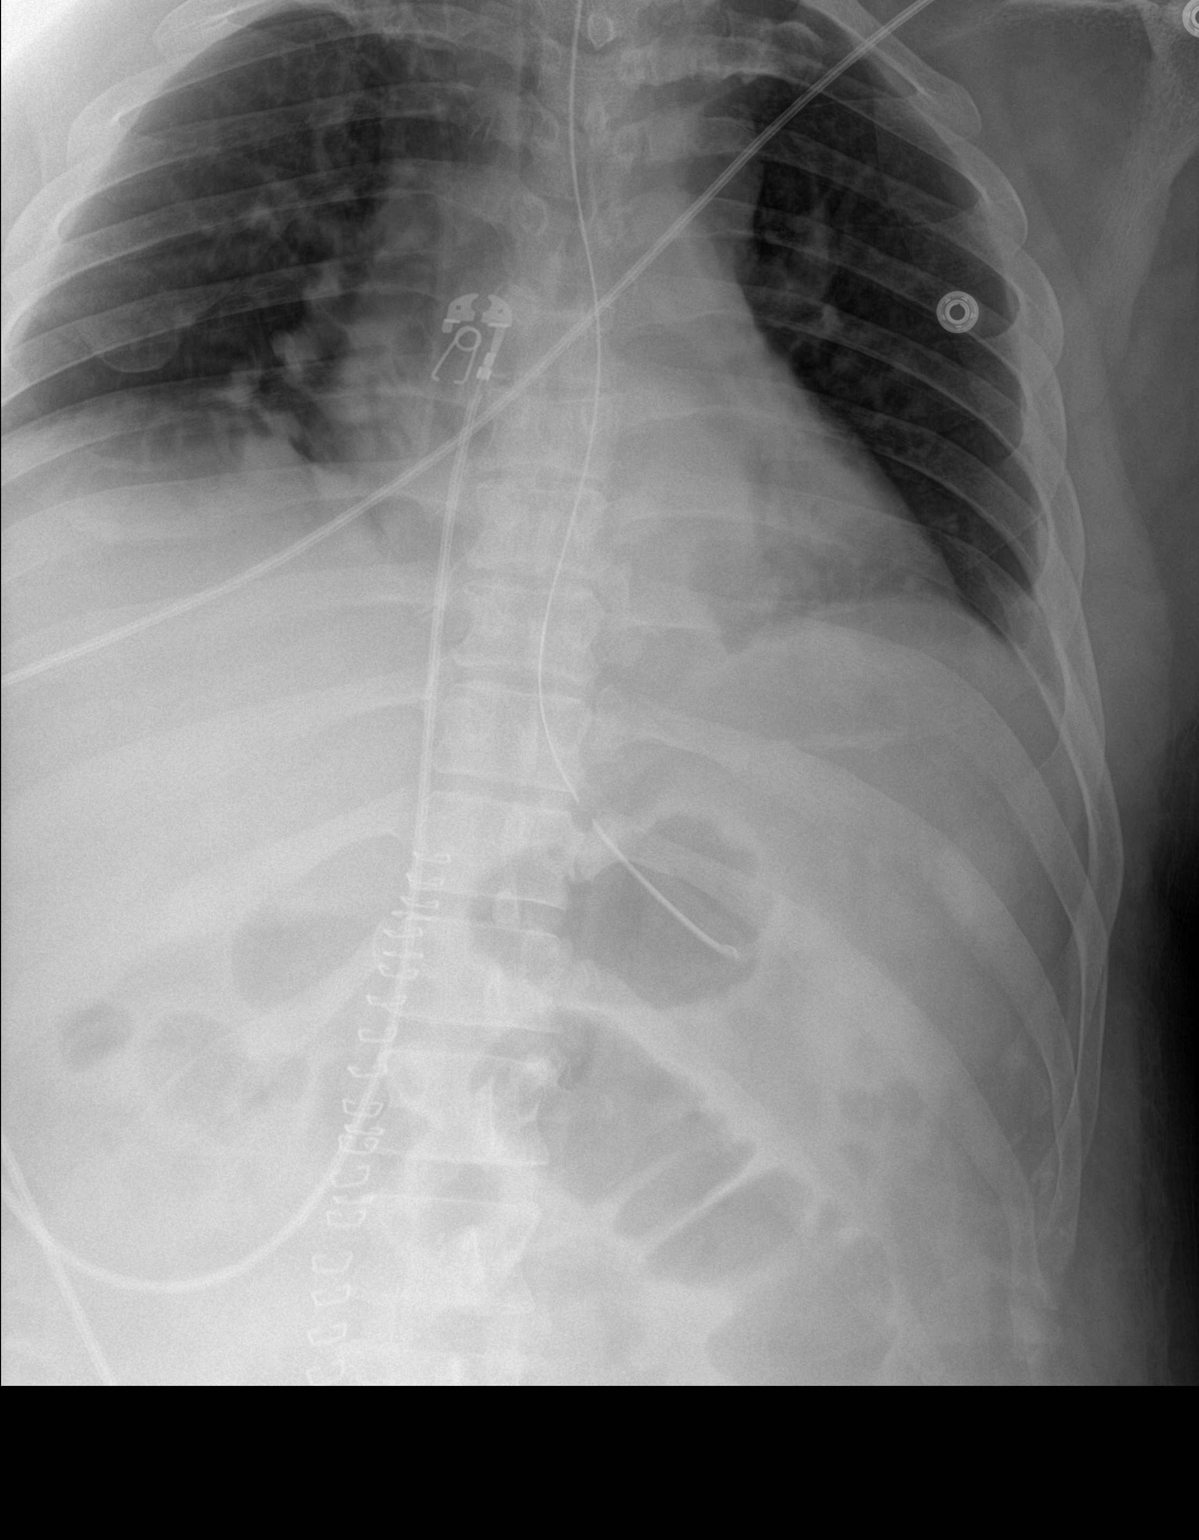

[1 of 1 positions shown; findings below may reference images not displayed]

FINDINGS: Nasogastric catheter is noted within the stomach. Postsurgical
changes are seen. Some persistent small bowel dilatation remains.
IMPRESSION: Nasogastric catheter within the stomach.

## 2018-11-07 DIAGNOSIS — C61 Malignant neoplasm of prostate: Secondary | ICD-10-CM | POA: Diagnosis not present

## 2018-11-20 ENCOUNTER — Encounter: Payer: Self-pay | Admitting: Family Medicine

## 2018-11-22 NOTE — Telephone Encounter (Signed)
He should ask his Urologist about this

## 2018-12-03 ENCOUNTER — Encounter: Payer: Self-pay | Admitting: Family Medicine

## 2018-12-05 NOTE — Telephone Encounter (Signed)
My first response is that as a prostate cancer patient he should stay away from anything that boosts his testosterone level. Second, I don't know enough about any of the OTC supplements to answer the question. I suggest he ask his urologist

## 2018-12-05 NOTE — Telephone Encounter (Signed)
Dr. Fry please advise. Thanks  

## 2018-12-22 ENCOUNTER — Other Ambulatory Visit: Payer: Self-pay

## 2018-12-22 ENCOUNTER — Emergency Department (HOSPITAL_BASED_OUTPATIENT_CLINIC_OR_DEPARTMENT_OTHER)
Admission: EM | Admit: 2018-12-22 | Discharge: 2018-12-22 | Disposition: A | Discharge status: Home / Self Care | Payer: 59 | Attending: Emergency Medicine | Admitting: Emergency Medicine

## 2018-12-22 ENCOUNTER — Emergency Department (HOSPITAL_BASED_OUTPATIENT_CLINIC_OR_DEPARTMENT_OTHER): Payer: 59

## 2018-12-22 ENCOUNTER — Encounter (HOSPITAL_BASED_OUTPATIENT_CLINIC_OR_DEPARTMENT_OTHER): Payer: Self-pay | Admitting: Emergency Medicine

## 2018-12-22 DIAGNOSIS — Z79899 Other long term (current) drug therapy: Secondary | ICD-10-CM | POA: Diagnosis not present

## 2018-12-22 DIAGNOSIS — Z87891 Personal history of nicotine dependence: Secondary | ICD-10-CM | POA: Diagnosis not present

## 2018-12-22 DIAGNOSIS — N201 Calculus of ureter: Secondary | ICD-10-CM | POA: Diagnosis not present

## 2018-12-22 DIAGNOSIS — R1032 Left lower quadrant pain: Secondary | ICD-10-CM | POA: Diagnosis present

## 2018-12-22 DIAGNOSIS — I10 Essential (primary) hypertension: Secondary | ICD-10-CM | POA: Insufficient documentation

## 2018-12-22 LAB — URINALYSIS, ROUTINE W REFLEX MICROSCOPIC
Bilirubin Urine: NEGATIVE
Glucose, UA: NEGATIVE mg/dL
Ketones, ur: NEGATIVE mg/dL
Leukocytes,Ua: NEGATIVE
Nitrite: NEGATIVE
Protein, ur: NEGATIVE mg/dL
Specific Gravity, Urine: 1.02 (ref 1.005–1.030)
pH: 6.5 (ref 5.0–8.0)

## 2018-12-22 LAB — URINALYSIS, MICROSCOPIC (REFLEX): RBC / HPF: >50 RBC/hpf (ref 0–5)

## 2018-12-22 MED ORDER — TAMSULOSIN HCL 0.4 MG PO CAPS
0.4000 mg | ORAL_CAPSULE | Freq: Once | ORAL | Status: AC
  Administered 2018-12-22: 0.4 mg via ORAL
  Filled 2018-12-22: qty 1

## 2018-12-22 MED ORDER — HYDROMORPHONE HCL 1 MG/ML IJ SOLN
1.0000 mg | Freq: Once | INTRAMUSCULAR | Status: AC
  Administered 2018-12-22: 1 mg via INTRAVENOUS
  Filled 2018-12-22: qty 1

## 2018-12-22 MED ORDER — ONDANSETRON HCL 4 MG/2ML IJ SOLN
4.0000 mg | Freq: Once | INTRAMUSCULAR | Status: AC
  Administered 2018-12-22: 4 mg via INTRAVENOUS
  Filled 2018-12-22: qty 2

## 2018-12-22 MED ORDER — ONDANSETRON 8 MG PO TBDP: 8.0000 mg | ORAL_TABLET | Freq: Three times a day (TID) | ORAL | 1 refills | Status: DC | PRN

## 2018-12-22 MED ORDER — TAMSULOSIN HCL 0.4 MG PO CAPS: ORAL_CAPSULE | ORAL | 0 refills | Status: DC

## 2018-12-22 MED ORDER — HYDROMORPHONE HCL 2 MG PO TABS: 2.0000 mg | ORAL_TABLET | ORAL | 0 refills | Status: DC | PRN

## 2018-12-22 NOTE — ED Triage Notes (Signed)
Patient arrived via POV c/o left sided flank pain starting approximately 0400. Patient endorses hx of kidney stones. Pain is 10/10 and woke patient from sleep. Patient is AO x 4, with elevated BP.

## 2018-12-22 NOTE — ED Provider Notes (Signed)
[facility] Provider Note: Georgena Spurling, MD, FACEP  CSN: [id number] MRN: [id number] ARRIVAL: 12/22/18 at 0535 ROOM: MH06/MH06   CHIEF COMPLAINT  Flank Pain   HISTORY OF PRESENT ILLNESS  12/22/18 6:04 AM Marcus Pruitt is a 48 y.o. male with a history of kidney stones.  He is here with left flank pain that woke him up about 4 AM today.  The pain radiates to the left lower quadrant.  He characterizes the pain is like previous kidney stones.  He rates his pain as a 7 out of 10 presently.  It is not particularly changed with movement or palpation.  He has not had nausea or vomiting with this.  He did notice his urine "looked funny".   Past Medical History:  Diagnosis Date  . Allergy   . Blood transfusion without reported diagnosis   . Clotting disorder ([place])   . H/O blood clots    mesenteric  . Hearing loss in right ear   . Hypertension   . Knee pain   . Low back pain   . Prostate cancer ([place])   . Sleep apnea    on CPAP  . Small intestinal bacterial overgrowth 09/22/2017   + Breath test 08/2017  . 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 ABDOMINAL WOUND DEHISCENCE N/A 05/20/2017   Procedure: REPAIR OF ABDOMINAL WOUND DEHISCENCE AND EVACUATION OF HEMATOMA;  Surgeon: Angelia Mould, MD;  Location: [place];  Service: Vascular;  Laterality: N/A;  . AORTOGRAM N/A 05/15/2017   Procedure: MESENTERIC AORTOGRAM;  Surgeon: Conrad [place], MD;  Location: [place];  Service: Vascular;  Laterality: N/A;  . COLONOSCOPY    . GIVENS CAPSULE STUDY N/A 08/26/2017   Procedure: GIVENS CAPSULE STUDY;  Surgeon: Gatha Mayer, MD;  Location: [facility] ENDOSCOPY;  Service: Endoscopy;  Laterality: N/A;  . INGUINAL HERNIA REPAIR Left 02/21/2016   Procedure: LAPAROSCOPIC INGUINAL HERNIA;  Surgeon: Alexis Frock, MD;  [facility];  Service: Urology;  Laterality: Left;  . LYMPHADENECTOMY Bilateral 02/21/2016   Procedure: PELVIC LYMPHADENECTOMY;  Surgeon: Alexis Frock, MD;  [facility];  Service: Urology;  Laterality: Bilateral;  . MANDIBLE SURGERY  1990  . MESENTERIC ARTERY BYPASS N/A 05/15/2017   Procedure: OPEN FENESTRATION SUPERIOR MESENTERIC ARTERY;  Surgeon: Conrad Honomu, MD;  Location: [facility] OR;  Service: Vascular;  Laterality: N/A;  . PATCH ANGIOPLASTY N/A 05/15/2017   Procedure: SUPERIOR MESENTERIC ARTERY PATCH ANGIOPLASTY USING PERI-GUARD PATCH;  Surgeon: Conrad Verona, MD;  Location: [place];  Service: Vascular;  Laterality: N/A;  . PERCUTANEOUS VENOUS THROMBECTOMY,LYSIS WITH INTRAVASCULAR ULTRASOUND (IVUS) N/A 05/15/2017   Procedure: THROMBECTOMY OF SUPERIOR MESENTERIC ARTERY;  Surgeon: Conrad Shoreline, MD;  Location: [place];  Service: Vascular;  Laterality: N/A;  . ROBOT ASSISTED LAPAROSCOPIC RADICAL PROSTATECTOMY N/A 02/21/2016   Procedure: XI ROBOTIC [place];  Surgeon: Alexis Frock, MD;  [facility];  Service: Urology;  Laterality: N/A;  . UMBILICAL HERNIA REPAIR  02/21/2016   Procedure: LAPAROSCOPIC UMBILICAL HERNIA;  Surgeon: Alexis Frock, MD;  [facility];  Service: Urology;;  . [name]  07/29/2017   Procedure: Visceral Angiogram;  Surgeon: Conrad Heeney, MD;  Location: [facility];  Service: Cardiovascular;;    Family History  Problem Relation Age of Onset  . Heart disease Maternal Grandmother   . Diabetes Other        fhx  .  Prostate cancer Father   . Hypertension Mother   . Stroke Mother   . Colon cancer Neg Hx   . Colon polyps Neg Hx   . Esophageal cancer Neg Hx   . Rectal cancer Neg Hx   . Stomach cancer Neg Hx     Social History   Tobacco Use  . Smoking status: Former Smoker    Quit date: 01/28/2007    Years since quitting: 11.9  . Smokeless tobacco: Never Used  Substance Use Topics  . Alcohol use: Yes    Alcohol/week: 0.0 standard drinks    Comment: occasional wine  . Drug use: No    Prior to  Admission medications   Medication Sig Start Date End Date Taking? Authorizing Provider  acetaminophen (TYLENOL) 500 MG tablet Take 1,000 mg by mouth every 6 (six) hours as needed for moderate pain or headache.    [provider]  amLODipine (NORVASC) 5 MG tablet TAKE 1 TABLET(5 MG) BY MOUTH DAILY 11/07/18   Laurey Morale, MD  cholestyramine light (PREVALITE) 4 g packet Take 1 packet (4 g total) by mouth See admin instructions. Dissolve 4 grams into 2 ounces of water and drink two times a day-  to reduce elevated serum cholesterol 05/25/18   Gatha Mayer, MD  oxyCODONE-acetaminophen (PERCOCET/ROXICET) 5-325 MG tablet Take 1-2 tablets by mouth every 6 (six) hours as needed for moderate pain or severe pain. 01/14/18   Antonietta Breach, PA-C  traMADol (ULTRAM) 50 MG tablet TK 2 TS PO Q 6 H PRF UP TO 5 DAYS 05/04/18   [provider]  triamcinolone cream (KENALOG) 0.1 % Apply 1 application topically 2 (two) times daily. 02/08/18   Laurey Morale, MD    Allergies Patient has no known allergies.   REVIEW OF SYSTEMS  Negative except as noted here or in the History of Present Illness.   PHYSICAL EXAMINATION  Initial Vital Signs Blood pressure (!) 158/115, pulse 78, temperature 98 F (36.7 C), temperature source Oral, resp. rate 19, height 5\' 6"  (1.676 m), weight 93 kg, SpO2 99 %.  Examination General: Well-developed, well-nourished male in no acute distress; appearance consistent with age of record HENT: normocephalic; atraumatic Eyes: pupils equal, round and reactive to light; extraocular muscles intact; arcus senilis bilaterally Neck: supple Heart: regular rate and rhythm Lungs: clear to auscultation bilaterally Abdomen: soft; nondistended; mild left lower quadrant tenderness; bowel sounds present GU: Mild left CVA tenderness Extremities: No deformity; full range of motion; pulses normal Neurologic: Awake, alert and oriented; motor function intact in all extremities and  symmetric; no facial droop Skin: Warm and dry Psychiatric: Normal mood and affect   RESULTS  Summary of this visit's results, reviewed by myself:   EKG Interpretation  Date/Time:    Ventricular Rate:    PR Interval:    QRS Duration:   QT Interval:    QTC Calculation:   R Axis:     Text Interpretation:        Laboratory Studies: Results for orders placed or performed during the hospital encounter of 12/22/18 (from the past 24 hour(s))  Urinalysis, Routine w reflex microscopic     Status: Abnormal   Collection Time: 12/22/18  6:06 AM  Result Value Ref Range   Color, Urine ORANGE (A) YELLOW   APPearance CLOUDY (A) CLEAR   Specific Gravity, Urine 1.020 1.005 - 1.030   pH 6.5 5.0 - 8.0   Glucose, UA NEGATIVE NEGATIVE mg/dL   Hgb urine dipstick LARGE (  A) NEGATIVE   Bilirubin Urine NEGATIVE NEGATIVE   Ketones, ur NEGATIVE NEGATIVE mg/dL   Protein, ur NEGATIVE NEGATIVE mg/dL   Nitrite NEGATIVE NEGATIVE   Leukocytes,Ua NEGATIVE NEGATIVE  Urinalysis, Microscopic (reflex)     Status: Abnormal   Collection Time: 12/22/18  6:06 AM  Result Value Ref Range   RBC / HPF >50 0 - 5 RBC/hpf   WBC, UA 0-5 0 - 5 WBC/hpf   Bacteria, UA RARE (A) NONE SEEN   Squamous Epithelial / LPF 0-5 0 - 5   Imaging Studies: Ct Renal Stone Study  Result Date: 12/22/2018 CLINICAL DATA:  Left-sided flank pain starting this morning. EXAM: CT ABDOMEN AND PELVIS WITHOUT CONTRAST TECHNIQUE: Multidetector CT imaging of the abdomen and pelvis was performed following the standard protocol without IV contrast. COMPARISON:  01/14/2018 FINDINGS: Lower chest:  No contributory findings. Hepatobiliary: No focal liver abnormality.No evidence of biliary obstruction or stone. Pancreas: Unremarkable. Spleen: Unremarkable. Adrenals/Urinary Tract: Negative adrenals. Mild left hydronephrosis due to a proximal ureteric stone measuring 6 mm on coronal reformats. Single right and 2 left renal calculi at the lower poles measuring  up to 7 mm on the left. Right renal cystic density. Unremarkable bladder. Stomach/Bowel:  No obstruction. No appendicitis. Vascular/Lymphatic: No acute vascular abnormality. Calcification and distortion at the celiac correlating with patient's history of celiac bypass after thrombosis. No mass or adenopathy. Reproductive:Prostatectomy with penile pump Other: No ascites or pneumoperitoneum. Wide necked midline incisional hernia. Musculoskeletal: No acute abnormalities. IMPRESSION: 1. Left hydronephrosis from a 6 mm proximal ureteric stone. 2. Bilateral nephrolithiasis. Electronically Signed   By: Monte Fantasia M.D.   On: 12/22/2018 06:40    ED COURSE and MDM  Nursing notes and initial vitals signs, including pulse oximetry, reviewed.  Vitals:   12/22/18 0552 12/22/18 0553  BP: (!) 158/115   Pulse: 78   Resp: 19   Temp: 98 F (36.7 C)   TempSrc: Oral   SpO2: 99%   Weight:  93 kg  Height:  5\' 6"  (1.676 m)   6:54 AM Pain well controlled after IV Dilaudid.  Patient advised of CT findings.  He has a urologist, Dr. Tresa Moore, with whom he can follow-up if the stone does not pass on its own.  PROCEDURES    ED DIAGNOSES     ICD-10-CM   1. Ureterolithiasis  N20.1        Taedyn Glasscock, MD 12/22/18 [phone]

## 2018-12-25 ENCOUNTER — Encounter: Payer: Self-pay | Admitting: Family Medicine

## 2018-12-26 ENCOUNTER — Other Ambulatory Visit: Payer: Self-pay

## 2018-12-26 ENCOUNTER — Ambulatory Visit (INDEPENDENT_AMBULATORY_CARE_PROVIDER_SITE_OTHER): Payer: 59 | Admitting: Family Medicine

## 2018-12-26 ENCOUNTER — Encounter: Payer: Self-pay | Admitting: Family Medicine

## 2018-12-26 DIAGNOSIS — N132 Hydronephrosis with renal and ureteral calculous obstruction: Secondary | ICD-10-CM

## 2018-12-26 DIAGNOSIS — N201 Calculus of ureter: Secondary | ICD-10-CM

## 2018-12-26 NOTE — Progress Notes (Signed)
Subjective:    Patient ID: Marcus Pruitt, male    DOB: 1970-09-18, 48 y.o.   MRN: [id number]  HPI Here to follow up an ER visit on 12-22-38 for severe left flank pain. He has a hx of kidney stones, and a CT revealed a 6 mm stone in the left proximal ureter. There was also left hydronephrosis and bilateral renal stones. He was given Dilaudid for pain. He has been drinking water but he has not passed the stone. The pain has not moved at all so he thinks the stone has not moved.  Virtual Visit via Video Note  I connected with the patient on 12/26/18 at  3:00 PM EDT by a video enabled telemedicine application and verified that I am speaking with the correct person using two identifiers.  Location patient: home Location provider:work or home office Persons participating in the virtual visit: patient, provider  I discussed the limitations of evaluation and management by telemedicine and the availability of in person appointments. The patient expressed understanding and agreed to proceed.   HPI:    ROS: See pertinent positives and negatives per HPI.  Past Medical History:  Diagnosis Date  . Allergy   . Blood transfusion without reported diagnosis   . Clotting disorder ([place])   . H/O blood clots    mesenteric  . Hearing loss in right ear   . Hypertension   . Knee pain   . Low back pain   . Prostate cancer ([place])   . Sleep apnea    on CPAP  . Small intestinal bacterial overgrowth 09/22/2017   + Breath test 08/2017  . 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 ABDOMINAL WOUND DEHISCENCE N/A 05/20/2017   Procedure: REPAIR OF ABDOMINAL WOUND DEHISCENCE AND EVACUATION OF HEMATOMA;  Surgeon: Angelia Mould, MD;  Location: [place];  Service: Vascular;  Laterality: N/A;  . AORTOGRAM N/A 05/15/2017   Procedure: MESENTERIC AORTOGRAM;  Surgeon: Conrad Sheridan, MD;  Location: [place];  Service: Vascular;  Laterality: N/A;   . COLONOSCOPY    . GIVENS CAPSULE STUDY N/A 08/26/2017   Procedure: GIVENS CAPSULE STUDY;  Surgeon: Gatha Mayer, MD;  Location: [facility] ENDOSCOPY;  Service: Endoscopy;  Laterality: N/A;  . INGUINAL HERNIA REPAIR Left 02/21/2016   Procedure: LAPAROSCOPIC INGUINAL HERNIA;  Surgeon: Alexis Frock, MD;  [facility];  Service: Urology;  Laterality: Left;  . LYMPHADENECTOMY Bilateral 02/21/2016   Procedure: PELVIC LYMPHADENECTOMY;  Surgeon: Alexis Frock, MD;  [facility];  Service: Urology;  Laterality: Bilateral;  . MANDIBLE SURGERY  1990  . MESENTERIC ARTERY BYPASS N/A 05/15/2017   Procedure: OPEN FENESTRATION SUPERIOR MESENTERIC ARTERY;  Surgeon: Conrad Quapaw, MD;  Location: [facility] OR;  Service: Vascular;  Laterality: N/A;  . PATCH ANGIOPLASTY N/A 05/15/2017   Procedure: SUPERIOR MESENTERIC ARTERY PATCH ANGIOPLASTY USING PERI-GUARD PATCH;  Surgeon: Conrad [place]ville, MD;  Location: [place];  Service: Vascular;  Laterality: N/A;  . PERCUTANEOUS VENOUS THROMBECTOMY,LYSIS WITH INTRAVASCULAR ULTRASOUND (IVUS) N/A 05/15/2017   Procedure: THROMBECTOMY OF SUPERIOR MESENTERIC ARTERY;  Surgeon: Conrad [place]yston, MD;  Location: [place];  Service: Vascular;  Laterality: N/A;  . ROBOT ASSISTED LAPAROSCOPIC RADICAL PROSTATECTOMY N/A 02/21/2016   Procedure: XI ROBOTIC [place];  Surgeon: Alexis Frock, MD;  [facility];  Service: Urology;  Laterality: N/A;  . UMBILICAL HERNIA REPAIR  02/21/2016   Procedure: LAPAROSCOPIC UMBILICAL HERNIA;  Surgeon:  Alexis Frock, MD;  [facility];  Service: Urology;;  . [name]  07/29/2017   Procedure: Visceral Angiogram;  Surgeon: Conrad [place], MD;  Location: [place] [facility];  Service: Cardiovascular;;    Family History  Problem Relation Age of Onset  . Heart disease Maternal Grandmother   . Diabetes Other        fhx  . Prostate cancer Father   . Hypertension Mother   . Stroke Mother   . Colon cancer Neg Hx   .  Colon polyps Neg Hx   . Esophageal cancer Neg Hx   . Rectal cancer Neg Hx   . Stomach cancer Neg Hx      Current Outpatient Medications:  .  amLODipine (NORVASC) 5 MG tablet, TAKE 1 TABLET(5 MG) BY MOUTH DAILY, Disp: 90 tablet, Rfl: 1 .  cholestyramine light (PREVALITE) 4 g packet, Take 1 packet (4 g total) by mouth See admin instructions. Dissolve 4 grams into 2 ounces of water and drink two times a day-  to reduce elevated serum cholesterol, Disp: 60 packet, Rfl: 5 .  HYDROmorphone (DILAUDID) 2 MG tablet, Take 1 tablet (2 mg total) by mouth every 4 (four) hours as needed for severe pain., Disp: 30 tablet, Rfl: 0 .  ondansetron (ZOFRAN ODT) 8 MG disintegrating tablet, Take 1 tablet (8 mg total) by mouth every 8 (eight) hours as needed for nausea or vomiting., Disp: 10 tablet, Rfl: 1 .  tamsulosin (FLOMAX) 0.4 MG CAPS capsule, Take 1 capsule daily until stone passes., Disp: 30 capsule, Rfl: 0  EXAM:  VITALS per patient if applicable:  GENERAL: alert, oriented, appears well and in no acute distress  HEENT: atraumatic, conjunttiva clear, no obvious abnormalities on inspection of external nose and ears  NECK: normal movements of the head and neck  LUNGS: on inspection no signs of respiratory distress, breathing rate appears normal, no obvious gross SOB, gasping or wheezing  CV: no obvious cyanosis  MS: moves all visible extremities without noticeable abnormality  PSYCH/NEURO: pleasant and cooperative, no obvious depression or anxiety, speech and thought processing grossly intact  ASSESSMENT AND PLAN: Ureteral stone with some hydronephrosis. We will refer him to Urology asap.  Alysia Penna, MD  Discussed the following assessment and plan:  No diagnosis found.     I discussed the assessment and treatment plan with the patient. The patient was provided an opportunity to ask questions and all were answered. The patient agreed with the plan and demonstrated an understanding of the  instructions.   The patient was advised to call back or seek an in-person evaluation if the symptoms worsen or if the condition fails to improve as anticipated.    Review of Systems     Objective:   Physical Exam        Assessment & Plan:

## 2018-12-26 NOTE — Telephone Encounter (Signed)
He was seen virtually today

## 2018-12-26 NOTE — Telephone Encounter (Signed)
Dr. Fry please advise. Thanks  

## 2018-12-27 ENCOUNTER — Encounter: Payer: Self-pay | Admitting: Family Medicine

## 2018-12-27 NOTE — Telephone Encounter (Signed)
noted 

## 2018-12-27 NOTE — Telephone Encounter (Signed)
FYI for Dr. Fry  

## 2018-12-27 NOTE — Telephone Encounter (Signed)
Dr. Sarajane Jews please advise on work note for the pt as discussed in his visit from yesterday.  Thank you .

## 2018-12-27 NOTE — Telephone Encounter (Signed)
The work note is ready

## 2019-01-20 ENCOUNTER — Other Ambulatory Visit: Payer: Self-pay | Admitting: Urology

## 2019-01-31 ENCOUNTER — Other Ambulatory Visit (HOSPITAL_COMMUNITY)
Admission: RE | Admit: 2019-01-31 | Discharge: 2019-01-31 | Disposition: A | Discharge status: Home / Self Care | Payer: 59 | Source: Ambulatory Visit | Attending: Urology | Admitting: Urology

## 2019-01-31 DIAGNOSIS — N211 Calculus in urethra: Secondary | ICD-10-CM | POA: Diagnosis not present

## 2019-01-31 DIAGNOSIS — Z20828 Contact with and (suspected) exposure to other viral communicable diseases: Secondary | ICD-10-CM | POA: Insufficient documentation

## 2019-01-31 DIAGNOSIS — N200 Calculus of kidney: Secondary | ICD-10-CM | POA: Diagnosis not present

## 2019-01-31 DIAGNOSIS — Z01812 Encounter for preprocedural laboratory examination: Secondary | ICD-10-CM | POA: Diagnosis not present

## 2019-01-31 LAB — SARS CORONAVIRUS 2 (TAT 6-24 HRS): SARS Coronavirus 2: NEGATIVE

## 2019-01-31 NOTE — Progress Notes (Signed)
PERIPHERAL VASCULAR CATHERIZATION 07-29-17 EPIC

## 2019-01-31 NOTE — Patient Instructions (Addendum)
YOU HAD  A COVID 19 TEST ON 01-31-2019. ONCE YOUR COVID TEST IS COMPLETED, PLEASE BEGIN THE QUARANTINE INSTRUCTIONS AS OUTLINED IN YOUR HANDOUT.                [place]    Your procedure is scheduled on: 02-03-2019  Report to [facility] Main  Entrance  Report to admitting at 96  AM   1 [place].    Call this number if you have problems the morning of surgery [phone]    Remember: Do not eat food or drink liquids :After Midnight. BRUSH YOUR TEETH MORNING OF SURGERY AND RINSE YOUR MOUTH OUT, NO CHEWING GUM CANDY OR MINTS.   BRING CPAP MASK AND TUBING  Take these medicines the morning of surgery with A SIP OF WATER: AMLODIPINE (NORVASC)              You may not have any metal on your body including hair pins and              piercings  Do not wear jewelry, make-up, lotions, powders or perfumes, deodorant                           Men may shave face and neck.   Do not bring valuables to the hospital. [place].  Contacts, dentures or bridgework may not be worn into surgery.  Leave suitcase in the car. After surgery it may be brought to your room.     Patients discharged the day of surgery will not be allowed to drive home. IF YOU ARE HAVING SURGERY AND GOING HOME THE SAME DAY, YOU MUST HAVE AN ADULT TO DRIVE YOU HOME AND BE WITH YOU FOR 24 HOURS. YOU MAY GO HOME BY TAXI OR UBER OR ORTHERWISE, BUT AN ADULT MUST ACCOMPANY YOU HOME AND STAY WITH YOU FOR 24 HOURS.  Name and phone number of your driver: [employer] [phone]  Special Instructions: N/A              Please read over the following fact sheets you were given: _____________________________________________________________________             [facility] - Preparing for Surgery Before surgery, you can play an important role.  Because skin is not sterile, your skin needs to be as  free of germs as possible.  You can reduce the number of germs on your skin by washing with CHG (chlorahexidine gluconate) soap before surgery.  CHG is an antiseptic cleaner which kills germs and bonds with the skin to continue killing germs even after washing. Please DO NOT use if you have an allergy to CHG or antibacterial soaps.  If your skin becomes reddened/irritated stop using the CHG and inform your nurse when you arrive at Short Stay. Do not shave (including legs and underarms) for at least 48 hours prior to the first CHG shower.  You may shave your face/neck. Please follow these instructions carefully:  1.  Shower with CHG Soap the night before surgery and the  morning of Surgery.  2.  If you choose to wash your hair, wash your hair first as usual with your  normal  shampoo.  3.  After you shampoo, rinse your hair and body thoroughly to  remove the  shampoo.                           4.  Use CHG as you would any other liquid soap.  You can apply chg directly  to the skin and wash                       Gently with a scrungie or clean washcloth.  5.  Apply the CHG Soap to your body ONLY FROM THE NECK DOWN.   Do not use on face/ open                           Wound or open sores. Avoid contact with eyes, ears mouth and genitals (private parts).                       Wash face,  Genitals (private parts) with your normal soap.             6.  Wash thoroughly, paying special attention to the area where your surgery  will be performed.  7.  Thoroughly rinse your body with warm water from the neck down.  8.  DO NOT shower/wash with your normal soap after using and rinsing off  the CHG Soap.                9.  Pat yourself dry with a clean towel.            10.  Wear clean pajamas.            11.  Place clean sheets on your bed the night of your first shower and do not  sleep with pets. Day of Surgery : Do not apply any lotions/deodorants the morning of surgery.  Please wear clean clothes to the  hospital/surgery center.  FAILURE TO FOLLOW THESE INSTRUCTIONS MAY RESULT IN THE CANCELLATION OF YOUR SURGERY PATIENT SIGNATURE_________________________________  NURSE SIGNATURE__________________________________  ________________________________________________________________________

## 2019-02-01 ENCOUNTER — Encounter (HOSPITAL_COMMUNITY): Payer: Self-pay

## 2019-02-01 ENCOUNTER — Other Ambulatory Visit: Payer: Self-pay

## 2019-02-01 ENCOUNTER — Encounter (HOSPITAL_COMMUNITY)
Admission: RE | Admit: 2019-02-01 | Discharge: 2019-02-01 | Disposition: A | Discharge status: Home / Self Care | Payer: 59 | Source: Ambulatory Visit | Attending: Urology | Admitting: Urology

## 2019-02-01 DIAGNOSIS — I10 Essential (primary) hypertension: Secondary | ICD-10-CM | POA: Diagnosis not present

## 2019-02-01 DIAGNOSIS — G4730 Sleep apnea, unspecified: Secondary | ICD-10-CM | POA: Diagnosis not present

## 2019-02-01 DIAGNOSIS — N5234 Erectile dysfunction following simple prostatectomy: Secondary | ICD-10-CM | POA: Diagnosis not present

## 2019-02-01 DIAGNOSIS — Z8042 Family history of malignant neoplasm of prostate: Secondary | ICD-10-CM | POA: Diagnosis not present

## 2019-02-01 DIAGNOSIS — Z8546 Personal history of malignant neoplasm of prostate: Secondary | ICD-10-CM | POA: Diagnosis not present

## 2019-02-01 DIAGNOSIS — Z87891 Personal history of nicotine dependence: Secondary | ICD-10-CM | POA: Diagnosis not present

## 2019-02-01 DIAGNOSIS — N202 Calculus of kidney with calculus of ureter: Secondary | ICD-10-CM | POA: Diagnosis not present

## 2019-02-01 HISTORY — DX: Tinnitus, unspecified ear: H93.19

## 2019-02-01 LAB — BASIC METABOLIC PANEL
Anion gap: 8 (ref 5–15)
BUN: 8 mg/dL (ref 6–20)
CO2: 25 mmol/L (ref 22–32)
Calcium: 8.9 mg/dL (ref 8.9–10.3)
Chloride: 105 mmol/L (ref 98–111)
Creatinine, Ser: 1.05 mg/dL (ref 0.61–1.24)
GFR calc Af Amer: >60 mL/min (ref >60)
GFR calc non Af Amer: >60 mL/min (ref >60)
Glucose, Bld: 93 mg/dL (ref 70–99)
Potassium: 3.9 mmol/L (ref 3.5–5.1)
Sodium: 138 mmol/L (ref 135–145)

## 2019-02-01 LAB — CBC
HCT: 42.4 % (ref 39.0–52.0)
Hemoglobin: 13.6 g/dL (ref 13.0–17.0)
MCH: 27.5 pg (ref 26.0–34.0)
MCHC: 32.1 g/dL (ref 30.0–36.0)
MCV: 85.7 fL (ref 80.0–100.0)
Platelets: 223 10*3/uL (ref 150–400)
RBC: 4.95 MIL/uL (ref 4.22–5.81)
RDW: 14.6 % (ref 11.5–15.5)
WBC: 3.8 10*3/uL — ABNORMAL LOW (ref 4.0–10.5)
nRBC: 0 % (ref 0.0–0.2)

## 2019-02-02 MED ORDER — GENTAMICIN SULFATE 40 MG/ML IJ SOLN
400.0000 mg | INTRAVENOUS | Status: AC
  Administered 2019-02-03: 400 mg via INTRAVENOUS
  Filled 2019-02-02: qty 10

## 2019-02-03 ENCOUNTER — Encounter (HOSPITAL_COMMUNITY)
Admission: RE | Disposition: A | Discharge status: Home / Self Care | Payer: Self-pay | Source: Other Acute Inpatient Hospital | Attending: Urology

## 2019-02-03 ENCOUNTER — Ambulatory Visit (HOSPITAL_COMMUNITY)
Admission: RE | Admit: 2019-02-03 | Discharge: 2019-02-03 | Disposition: A | Discharge status: Home / Self Care | Payer: 59 | Source: Other Acute Inpatient Hospital | Attending: Urology | Admitting: Urology

## 2019-02-03 ENCOUNTER — Ambulatory Visit (HOSPITAL_COMMUNITY): Payer: 59 | Admitting: Physician Assistant

## 2019-02-03 ENCOUNTER — Ambulatory Visit (HOSPITAL_COMMUNITY): Payer: 59 | Admitting: Anesthesiology

## 2019-02-03 ENCOUNTER — Other Ambulatory Visit: Payer: Self-pay

## 2019-02-03 ENCOUNTER — Encounter (HOSPITAL_COMMUNITY): Payer: Self-pay | Admitting: Emergency Medicine

## 2019-02-03 ENCOUNTER — Ambulatory Visit (HOSPITAL_COMMUNITY): Payer: 59

## 2019-02-03 DIAGNOSIS — N202 Calculus of kidney with calculus of ureter: Secondary | ICD-10-CM | POA: Diagnosis not present

## 2019-02-03 DIAGNOSIS — I10 Essential (primary) hypertension: Secondary | ICD-10-CM | POA: Insufficient documentation

## 2019-02-03 DIAGNOSIS — Z8042 Family history of malignant neoplasm of prostate: Secondary | ICD-10-CM | POA: Insufficient documentation

## 2019-02-03 DIAGNOSIS — G4730 Sleep apnea, unspecified: Secondary | ICD-10-CM | POA: Insufficient documentation

## 2019-02-03 DIAGNOSIS — Z87891 Personal history of nicotine dependence: Secondary | ICD-10-CM | POA: Insufficient documentation

## 2019-02-03 DIAGNOSIS — N5234 Erectile dysfunction following simple prostatectomy: Secondary | ICD-10-CM | POA: Insufficient documentation

## 2019-02-03 DIAGNOSIS — Z8546 Personal history of malignant neoplasm of prostate: Secondary | ICD-10-CM | POA: Insufficient documentation

## 2019-02-03 HISTORY — PX: CYSTOSCOPY WITH RETROGRADE PYELOGRAM, URETEROSCOPY AND STENT PLACEMENT: SHX5789

## 2019-02-03 HISTORY — PX: HOLMIUM LASER APPLICATION: SHX5852

## 2019-02-03 SURGERY — CYSTOURETEROSCOPY, WITH RETROGRADE PYELOGRAM AND STENT INSERTION
Anesthesia: General | Laterality: Bilateral

## 2019-02-03 MED ORDER — IOHEXOL 300 MG/ML  SOLN
INTRAMUSCULAR | Status: DC | PRN
  Administered 2019-02-03: 20 mL via URETHRAL

## 2019-02-03 MED ORDER — OXYCODONE-ACETAMINOPHEN 5-325 MG PO TABS
ORAL_TABLET | ORAL | Status: AC
  Administered 2019-02-03: 1 via ORAL
  Filled 2019-02-03: qty 1

## 2019-02-03 MED ORDER — PROPOFOL 10 MG/ML IV BOLUS
INTRAVENOUS | Status: DC | PRN
  Administered 2019-02-03: 200 mg via INTRAVENOUS

## 2019-02-03 MED ORDER — ONDANSETRON HCL 4 MG/2ML IJ SOLN
INTRAMUSCULAR | Status: AC
  Filled 2019-02-03: qty 2

## 2019-02-03 MED ORDER — ONDANSETRON HCL 4 MG/2ML IJ SOLN
INTRAMUSCULAR | Status: DC | PRN
  Administered 2019-02-03: 4 mg via INTRAVENOUS

## 2019-02-03 MED ORDER — OXYCODONE-ACETAMINOPHEN 5-325 MG PO TABS: 1.0000 | ORAL_TABLET | Freq: Three times a day (TID) | ORAL | 0 refills | Status: AC | PRN

## 2019-02-03 MED ORDER — DEXAMETHASONE SODIUM PHOSPHATE 10 MG/ML IJ SOLN
INTRAMUSCULAR | Status: DC | PRN
  Administered 2019-02-03: 10 mg via INTRAVENOUS

## 2019-02-03 MED ORDER — DEXAMETHASONE SODIUM PHOSPHATE 10 MG/ML IJ SOLN
INTRAMUSCULAR | Status: AC
  Filled 2019-02-03: qty 1

## 2019-02-03 MED ORDER — KETOROLAC TROMETHAMINE 10 MG PO TABS
10.0000 mg | ORAL_TABLET | Freq: Once | ORAL | Status: AC
  Administered 2019-02-03: 10 mg via ORAL
  Filled 2019-02-03: qty 1

## 2019-02-03 MED ORDER — FENTANYL CITRATE (PF) 100 MCG/2ML IJ SOLN
INTRAMUSCULAR | Status: DC | PRN
  Administered 2019-02-03: 25 ug via INTRAVENOUS
  Administered 2019-02-03: 50 ug via INTRAVENOUS
  Administered 2019-02-03: 25 ug via INTRAVENOUS

## 2019-02-03 MED ORDER — FENTANYL CITRATE (PF) 250 MCG/5ML IJ SOLN
INTRAMUSCULAR | Status: AC
  Filled 2019-02-03: qty 5

## 2019-02-03 MED ORDER — ONDANSETRON HCL 4 MG/2ML IJ SOLN: 4.0000 mg | Freq: Once | INTRAMUSCULAR | Status: DC | PRN

## 2019-02-03 MED ORDER — SENNOSIDES-DOCUSATE SODIUM 8.6-50 MG PO TABS: 1.0000 | ORAL_TABLET | Freq: Two times a day (BID) | ORAL | 0 refills | Status: DC

## 2019-02-03 MED ORDER — OXYCODONE-ACETAMINOPHEN 5-325 MG PO TABS
1.0000 | ORAL_TABLET | Freq: Once | ORAL | Status: AC
  Administered 2019-02-03: 1 via ORAL

## 2019-02-03 MED ORDER — CEPHALEXIN 500 MG PO CAPS: 500.0000 mg | ORAL_CAPSULE | Freq: Two times a day (BID) | ORAL | 0 refills | Status: DC

## 2019-02-03 MED ORDER — KETOROLAC TROMETHAMINE 10 MG PO TABS: 10.0000 mg | ORAL_TABLET | Freq: Three times a day (TID) | ORAL | 0 refills | Status: DC | PRN

## 2019-02-03 MED ORDER — LACTATED RINGERS IV SOLN
INTRAVENOUS | Status: DC
  Administered 2019-02-03: 07:00:00 via INTRAVENOUS

## 2019-02-03 MED ORDER — MIDAZOLAM HCL 2 MG/2ML IJ SOLN
INTRAMUSCULAR | Status: AC
  Filled 2019-02-03: qty 2

## 2019-02-03 MED ORDER — MEPERIDINE HCL 50 MG/ML IJ SOLN: 6.2500 mg | INTRAMUSCULAR | Status: DC | PRN

## 2019-02-03 MED ORDER — LIDOCAINE HCL (CARDIAC) PF 100 MG/5ML IV SOSY
PREFILLED_SYRINGE | INTRAVENOUS | Status: DC | PRN
  Administered 2019-02-03: 100 mg via INTRAVENOUS

## 2019-02-03 MED ORDER — OXYCODONE-ACETAMINOPHEN 5-325 MG PO TABS
ORAL_TABLET | ORAL | Status: AC
  Filled 2019-02-03: qty 1

## 2019-02-03 MED ORDER — MIDAZOLAM HCL 5 MG/5ML IJ SOLN
INTRAMUSCULAR | Status: DC | PRN
  Administered 2019-02-03: 2 mg via INTRAVENOUS

## 2019-02-03 MED ORDER — SODIUM CHLORIDE 0.9 % IR SOLN
Status: DC | PRN
  Administered 2019-02-03: 3000 mL via INTRAVESICAL

## 2019-02-03 MED ORDER — PROPOFOL 10 MG/ML IV BOLUS
INTRAVENOUS | Status: AC
  Filled 2019-02-03: qty 20

## 2019-02-03 MED ORDER — HYDROMORPHONE HCL 1 MG/ML IJ SOLN: 0.2500 mg | INTRAMUSCULAR | Status: DC | PRN

## 2019-02-03 MED ORDER — LIDOCAINE 2% (20 MG/ML) 5 ML SYRINGE
INTRAMUSCULAR | Status: AC
  Filled 2019-02-03: qty 5

## 2019-02-03 MED ORDER — 0.9 % SODIUM CHLORIDE (POUR BTL) OPTIME
TOPICAL | Status: DC | PRN
  Administered 2019-02-03: 1000 mL

## 2019-02-03 SURGICAL SUPPLY — 25 items
BAG URO CATCHER STRL LF (MISCELLANEOUS) ×3 IMPLANT
BASKET LASER NITINOL 1.9FR (BASKET) ×2 IMPLANT
BASKET STONE NCOMPASS (UROLOGICAL SUPPLIES) ×2 IMPLANT
CATH INTERMIT  6FR 70CM (CATHETERS) ×3 IMPLANT
CLOTH BEACON ORANGE TIMEOUT ST (SAFETY) ×1 IMPLANT
COVER SURGICAL LIGHT HANDLE (MISCELLANEOUS) ×1 IMPLANT
COVER WAND RF STERILE (DRAPES) IMPLANT
EXTRACTOR STONE 1.7FRX115CM (UROLOGICAL SUPPLIES) IMPLANT
FIBER LASER FLEXIVA 1000 (UROLOGICAL SUPPLIES) IMPLANT
FIBER LASER FLEXIVA 365 (UROLOGICAL SUPPLIES) IMPLANT
FIBER LASER FLEXIVA 550 (UROLOGICAL SUPPLIES) IMPLANT
FIBER LASER TRAC TIP (UROLOGICAL SUPPLIES) ×2 IMPLANT
GLOVE BIOGEL M STRL SZ7.5 (GLOVE) ×3 IMPLANT
GOWN STRL REUS W/TWL LRG LVL3 (GOWN DISPOSABLE) ×3 IMPLANT
GUIDEWIRE ANG ZIPWIRE 038X150 (WIRE) ×5 IMPLANT
GUIDEWIRE STR DUAL SENSOR (WIRE) ×5 IMPLANT
KIT TURNOVER KIT A (KITS) ×2 IMPLANT
MANIFOLD NEPTUNE II (INSTRUMENTS) ×3 IMPLANT
PACK CYSTO (CUSTOM PROCEDURE TRAY) ×3 IMPLANT
SHEATH URETERAL 12FRX28CM (UROLOGICAL SUPPLIES) IMPLANT
SHEATH URETERAL 12FRX35CM (MISCELLANEOUS) IMPLANT
STENT POLARIS 5FRX24 (STENTS) ×4 IMPLANT
TUBE FEEDING 8FR 16IN STR KANG (MISCELLANEOUS) ×3 IMPLANT
TUBING CONNECTING 10 (TUBING) ×2 IMPLANT
TUBING CONNECTING 10' (TUBING) ×1

## 2019-02-03 NOTE — Anesthesia Preprocedure Evaluation (Addendum)
Anesthesia Evaluation  Patient identified by MRN, date of birth, ID band Patient awake    Reviewed: Allergy & Precautions, NPO status , Patient's Chart, lab work & pertinent test results  Airway Mallampati: I  TM Distance: >3 FB Neck ROM: Full    Dental   Pulmonary sleep apnea and Continuous Positive Airway Pressure Ventilation , former smoker,    Pulmonary exam normal        Cardiovascular hypertension, Pt. on medications Normal cardiovascular exam     Neuro/Psych    GI/Hepatic   Endo/Other    Renal/GU      Musculoskeletal   Abdominal   Peds  Hematology   Anesthesia Other Findings   Reproductive/Obstetrics                             Anesthesia Physical Anesthesia Plan  ASA: II  Anesthesia Plan: General   Post-op Pain Management:    Induction: Intravenous  PONV Risk Score and Plan: 2 and Ondansetron, Midazolam and Treatment may vary due to age or medical condition  Airway Management Planned: LMA  Additional Equipment:   Intra-op Plan:   Post-operative Plan: Extubation in OR  Informed Consent: I have reviewed the patients History and Physical, chart, labs and discussed the procedure including the risks, benefits and alternatives for the proposed anesthesia with the patient or authorized representative who has indicated his/her understanding and acceptance.       Plan Discussed with: CRNA and Surgeon  Anesthesia Plan Comments:         Anesthesia Quick Evaluation

## 2019-02-03 NOTE — Op Note (Signed)
Marcus Pruitt, [place] [facility] MEDICAL RECORD [id number] ACCOUNT [id number] DATE OF BIRTH:[birth year] FACILITY: [facility] LOCATION: [facility] PHYSICIAN:Nancylee Gaines, MD  OPERATIVE REPORT  DATE OF PROCEDURE:  02/03/2019  PREOPERATIVE DIAGNOSIS:  Left ureteral bilateral renal stones.  PROCEDURE: 1.  Cystoscopy, bilateral retrograde pyelograms, interpretation. 2.  Bilateral ureteroscopy, laser lithotripsy. 3.  Insertion of bilateral ureteral stents 5 x 24 Polaris, no tether.    ESTIMATED BLOOD LOSS:  Nil.  COMPLICATIONS:  None.  SPECIMEN:  Bilateral renal and left ureteral stone fragments for analysis.  FINDINGS: 1.  Bilateral mid and lower pole stones, approximately 8 sq mm right side, 8 sq mm, left side. 2.  Left proximal ureteral stone, mild hydronephrosis. 3.  Successful placement of bilateral ureteral stents, proximal end in renal pelvis, distal end in urinary bladder.  INDICATIONS:  The patient is a pleasant 48 year old gentleman with history of early-for-age  prostate cancer.  He is status post prostatectomy several years ago and has done well since this.  He also has a penile prosthesis in situ.  He has been worked  up with colicky left flank pain to have a left ureteral and bilateral renal stones.  He was given a trial of medical therapy, but failed to pass the stones.  Options were discussed for further management including continued medical therapy versus  shockwave lithotripsy of obstructing stone versus bilateral ureteroscopy with goal of stone free, and he wished to proceed with the latter.  Informed consent was obtained and placed in the medical record.  PROCEDURE IN DETAIL:  The patient being identified, the procedure being bilateral ureteroscopic stimulation was confirmed.  Procedure timeout was performed.  IV antibiotics were administered.  General LMA anesthesia was induced.  The patient was placed  into a low lithotomy position.  A sterile field was created, draping the  patient's penis, perineum, and proximal thighs using iodine.  His in situ penile prosthesis was uncomplicated without erosion, and cystourethroscopy was performed using a 21-French  rigid cystoscope.  Inspection of anterior and posterior urethra revealed surgical absence of the prostate.  No evidence of strictures.  Inspection of urinary bladder revealed no diverticula, calcifications, or papillary lesions.  Ureteral orifices were  singleton bilaterally.  The right ureteral orifice was cannulated with a 6-French renal catheter, and right retrograde pyelogram was obtained.  Right retrograde pyelogram demonstrated a single right ureter and single-system right kidney.  No filling defects or narrowing noted.  A 0.038 ZIPwire was advanced to the lower pole and set aside as a safety wire.  Next, left retrograde pyelogram was  obtained.  Left retrograde pyelogram demonstrated a single left ureter, single-system left kidney.  There was a small filling defect in the proximal ureter consistent with known stone.  There was mild hydronephrosis.  A 0.038 ZIPwire was advanced to the lower pole  and set aside as a safety wire.  An 8-French feeding tube was placed in the urinary bladder for pressure release, and semirigid ureteroscopy was performed on the left ureter alongside a separate sensor working wire.  As expected, there was a proximal  ureteral stone.  It was minimally impacted at this point, and it was retrograde positioned distally.  Irrigation to the level of the kidney.  Next, semirigid ureteroscopy from the distal four-fifths of the right ureter alongside a separate sensor working  wire.  No mucosal abnormalities were found.  The semirigid scope was then exchanged for a 12/14 medium-length ureteral access sheath to the level of the proximal ureter using continuous fluoroscopic guidance,  and flexible digital ureteroscopy was  performed in the proximal ureter and systematic cystoscopy, right kidney, including  all calices x3.  There was a dominant lower mid calcification estimated at approximately 8 mm.  It was too large for simple basketing.  It was grasped with the basket and  repositioned into an upper pole calix to allow for less acute angulation, and holmium laser energy applied settings of 0.2 joules and 20 Hz, fragmented into approximately 4 smaller pieces that were then sequentially grasped with the long axis, removed,  and set aside for composition analysis.  The access sheath was removed under continuous vision.  No mucosal abnormalities were found.  Next, the access sheath was placed over the left working wire to the level of the proximal ureter using continuous  fluoroscopic guidance, taking exquisite care not to advance the sheath further than the previously visualized portion, and semirigid ureteroscopy was performed of the proximal left ureter and systematic inspection of the left kidney.  Again, the prior  ureteral stone was repositioned into the renal pelvis and then further retrograde positioned into upper pole calix.  There were several lower midcalcifications noted, total volume approximately 8 sq mm.  Many of these were small and amenable to simple  basketing.  There was no dominant calcification that was grasped and repositioned to an upper pole calix, and holmium laser energy applied to the dominant stones, again using settings of 0.2 joules and 20 Hz and then fragmented into approximately 3  smaller pieces each, which were then sequentially grasped, removed, and set aside for composition analysis.  Following this, complete resolution of all accessible stone fragments larger than 130 mm.  Access sheath was removed under continuous vision, and  there was some moderate mucosal edema in the site of prior stone impaction.  As such, it was felt that brief interval stenting with nontethered stents would be warranted.  As such, new 5 x 24 Polaris-type stents were placed in the remaining safety wire   using fluoroscopic guidance.  Good proximal and distal planes were noted.  The procedure was terminated.  The patient tolerated the procedure well.  No immediate perioperative complications.  The patient was taken to postanesthesia care unit in stable  condition.  Plan on  discharge home.  LN/NUANCE  D:02/03/2019 T:02/03/2019 JOB:[id number]

## 2019-02-03 NOTE — Anesthesia Procedure Notes (Signed)
Procedure Name: LMA Insertion Date/Time: 02/03/2019 8:36 AM Performed by: Glory Buff, CRNA Pre-anesthesia Checklist: Patient identified, Emergency Drugs available, Suction available and Patient being monitored Patient Re-evaluated:Patient Re-evaluated prior to induction Oxygen Delivery Method: Circle system utilized Induction Type: IV induction LMA: LMA inserted LMA Size: 4.0 Number of attempts: 1 Placement Confirmation: positive ETCO2 Tube secured with: Tape Dental Injury: Teeth and Oropharynx as per pre-operative assessment

## 2019-02-03 NOTE — Discharge Instructions (Signed)
1 - You may have urinary urgency (bladder spasms) and bloody urine on / off with stent in place. This is normal. ° °2 - Call MD or go to ER for fever >102, severe pain / nausea / vomiting not relieved by medications, or acute change in medical status ° °

## 2019-02-03 NOTE — Brief Op Note (Signed)
02/03/2019  9:48 AM  PATIENT:  Marcus Pruitt  48 y.o. male  PRE-OPERATIVE DIAGNOSIS:  LEFT URETERALSTONE, BILATERAL RENAL STONES  POST-OPERATIVE DIAGNOSIS:  LEFT URETERALSTONE, BILATERAL RENAL STONES  PROCEDURE:  Procedure(s) with comments: CYSTOSCOPY WITH RETROGRADE PYELOGRAM, URETEROSCOPY AND STENT PLACEMENT (Bilateral) - 75 MINS HOLMIUM LASER APPLICATION (Bilateral)  SURGEON:  Surgeon(s) and Role:    Alexis Frock, MD - Primary  PHYSICIAN ASSISTANT:   ASSISTANTS: none   ANESTHESIA:   general  EBL:  minimal   BLOOD ADMINISTERED:none  DRAINS: none   LOCAL MEDICATIONS USED:  NONE  SPECIMEN:  Source of Specimen:  bilateral renal / ureteral stone fragments  DISPOSITION OF SPECIMEN:  [facility] for compositional analysis  COUNTS:  YES  TOURNIQUET:  * No tourniquets in log *  DICTATION: .Other Dictation: Dictation Number [id number]  PLAN OF CARE: Discharge to home after PACU  PATIENT DISPOSITION:  PACU - hemodynamically stable.   Delay start of Pharmacological VTE agent (>24hrs) due to surgical blood loss or risk of bleeding: yes

## 2019-02-03 NOTE — H&P (Signed)
Marcus Pruitt is an 48 y.o. male.    Chief Complaint: Pre-Op BILATERAL Ureteroscopic Stone Manipulation  HPI:   1 - Moderate Risk Prostate Cancer - s/p robotic prostatectomy + bilateral limited pelvic lymphadenectomy 01/2016 for [id number] Gleason 3+4=7 adenocarcinoma with negative margins. Pre-op TRUS 155mL and PSA 3.95.   Summarized Post-op Course:  05/2016 - PSA <0.01  10/2018 - PSA <0.01   2 - Stress Urinary Incontinence - s/p prostatectomy 01/2016. Received peri-op PT. At 6 weeks post-op using 3 pads per day, negative cough stress test, dry at night. He is compliant with PT. At 3 mos+ pad free. Some urine loss with orgasm only, but this is expected, manages with condom.   3 - Erectile Dysfunction - s/p prostatectomy 01/2016 with moderate bilateral nerve sparing. Underwent IPP 2020 by Terlecki at [facility].   4 - Urolithiasis - Lt prox ureter 32mm obstructing stone as well as LLP 47mm, RLP 11mm by er CT 12/2018 on eval flank pain. UCX negative. No passage with several weeks medical therapy. Dominant stone 1200HU, SSD 15cm.   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 His PCP is Marcus Penna MD with [facility].   Today "[name]" is seen to proceed with BILATERAL ureteroscopic stone manipulation. No intervla fevers. Most recent UCX negative.    Past Medical History:  Diagnosis Date  . Allergy   . Blood transfusion without reported diagnosis   . Clotting disorder ([place])    RESOLVED NOW PER PT  . H/O blood clots    mesenteric  . Hearing loss in right ear   . Hypertension   . Low back pain   . Prostate cancer ([facility])   . Sleep apnea    on CPAP  . Small intestinal bacterial overgrowth 09/22/2017   + Breath test 08/2017  . 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 Tinnitus    RIGHT EAR    Past Surgical History:  Procedure Laterality Date  .  ABDOMINAL WOUND DEHISCENCE N/A 05/20/2017   Procedure: REPAIR OF ABDOMINAL WOUND DEHISCENCE AND EVACUATION OF HEMATOMA;  Surgeon: Angelia Mould, MD;  Location: [place];  Service: Vascular;  Laterality: N/A;  . AORTOGRAM N/A 05/15/2017   Procedure: MESENTERIC AORTOGRAM;  Surgeon: Marcus [place], MD;  Location: [place];  Service: Vascular;  Laterality: N/A;  . COLONOSCOPY    . GIVENS CAPSULE STUDY N/A 08/26/2017   Procedure: GIVENS CAPSULE STUDY;  Surgeon: Marcus Mayer, MD;  Location: [facility];  Service: Endoscopy;  Laterality: N/A;  . INGUINAL HERNIA REPAIR Left 02/21/2016   Procedure: LAPAROSCOPIC INGUINAL HERNIA;  Surgeon: Marcus Frock, MD;  Location: [facility];  Service: Urology;  Laterality: Left;  . LYMPHADENECTOMY Bilateral 02/21/2016   Procedure: PELVIC LYMPHADENECTOMY;  Surgeon: Marcus Frock, MD;  Location: [facility];  Service: Urology;  Laterality: Bilateral;  . MANDIBLE SURGERY  1990  . MESENTERIC ARTERY BYPASS N/A 05/15/2017   Procedure: OPEN FENESTRATION SUPERIOR MESENTERIC ARTERY;  Surgeon: Marcus [place], MD;  Location: [facility];  Service: Vascular;  Laterality: N/A;  . PATCH ANGIOPLASTY N/A 05/15/2017   Procedure: SUPERIOR MESENTERIC ARTERY PATCH ANGIOPLASTY USING PERI-GUARD PATCH;  Surgeon: Marcus [place], MD;  Location: [place];  Service: Vascular;  Laterality: N/A;  . PERCUTANEOUS VENOUS THROMBECTOMY,LYSIS WITH INTRAVASCULAR ULTRASOUND (IVUS) N/A 05/15/2017   Procedure: THROMBECTOMY OF SUPERIOR MESENTERIC ARTERY;  Surgeon: Marcus Zena,  MD;  Location: [place];  Service: Vascular;  Laterality: N/A;  . ROBOT ASSISTED LAPAROSCOPIC RADICAL PROSTATECTOMY N/A 02/21/2016   Procedure: XI ROBOTIC ASSISTED LAPAROSCOPIC RADICAL PROSTATECTOMY;  Surgeon: Marcus Frock, MD;  Location[facility];  Service: Urology;  Laterality: N/A;  . UMBILICAL HERNIA REPAIR  02/21/2016   Procedure: LAPAROSCOPIC UMBILICAL HERNIA;  Surgeon: Marcus Frock, MD;  Location: [facility];  Service: Urology;;  . Marcus Pruitt  07/29/2017   Procedure: Visceral Angiogram;  Surgeon: Marcus [place], MD;  Location: [place] [facility];  Service: Cardiovascular;;    Family History  Problem Relation Age of Onset  . Heart disease Maternal Grandmother   . Diabetes Other        fhx  . Prostate cancer Father   . Hypertension Mother   . Stroke Mother   . Colon cancer Neg Hx   . Colon polyps Neg Hx   . Esophageal cancer Neg Hx   . Rectal cancer Neg Hx   . Stomach cancer Neg Hx    Social History:  reports that he quit smoking about 12 years ago. He has never used smokeless tobacco. He reports current alcohol use. He reports that he does not use drugs.  Allergies: No Known Allergies  No medications prior to admission.    Results for orders placed or performed during the hospital encounter of 02/01/19 (from the past 48 hour(s))  Basic metabolic panel     Status: None   Collection Time: 02/01/19 11:37 AM  Result Value Ref Range   Sodium 138 135 - 145 mmol/L   Potassium 3.9 3.5 - 5.1 mmol/L   Chloride 105 98 - 111 mmol/L   CO2 25 22 - 32 mmol/L   Glucose, Bld 93 70 - 99 mg/dL   BUN 8 6 - 20 mg/dL   Creatinine, Ser 1.05 0.61 - 1.24 mg/dL   Calcium 8.9 8.9 - 10.3 mg/dL   GFR calc non Af Amer >60 >60 mL/min   GFR calc Af Amer >60 >60 mL/min   Anion gap 8 5 - 15    Comment: Performed at [facility], [place] [address] [place], [place] [address]  CBC     Status: Abnormal   Collection Time: 02/01/19 11:37 AM  Result Value Ref Range   WBC 3.8 (L) 4.0 - 10.5 K/uL   RBC 4.95 4.22 - 5.81 MIL/uL   Hemoglobin 13.6 13.0 - 17.0 g/dL   HCT 42.4 39.0 - 52.0 %   MCV 85.7 80.0 - 100.0 fL   MCH 27.5 26.0 - 34.0 pg   MCHC 32.1 30.0 - 36.0 g/dL   RDW 14.6 11.5 - 15.5 %   Platelets 223 150 - 400 K/uL   nRBC 0.0 0.0 - 0.2 %    Comment: Performed at [facility], [place] [address], [place],  [address]   No results found.  Review of Systems  Constitutional: Negative for  chills and fever.  Genitourinary: Positive for flank pain.  All other systems reviewed and are negative.   There were no vitals taken for this visit. Physical Exam  Constitutional: He appears well-developed.  HENT:  Head: Normocephalic.  Eyes: Pupils are equal, round, and reactive to light.  Neck: Normal range of motion.  Cardiovascular: Normal rate.  Respiratory: Effort normal.  GI: Soft.  Stable scars and mild obesity, no hernias.   Genitourinary:    Genitourinary Comments: In situ penile prosthesis w/o erosion, mild left CVAT at present.  Musculoskeletal: Normal range of motion.  Neurological: He is alert.  Skin: Skin is warm.  Psychiatric: He has a normal mood and affect.     Assessment/Plan  Proceed as planned with BILATERAL ureteroscopic stone manipulation. Risks, benefits, alternatives, expected peri-op course including need for temporary stenting given bilateral procedure discussed previously and reiterated today.   Marcus Frock, MD 02/03/2019, 5:22 AM

## 2019-02-03 NOTE — Transfer of Care (Signed)
Immediate Anesthesia Transfer of Care Note  Patient: Marcus Pruitt  Procedure(s) Performed: CYSTOSCOPY WITH RETROGRADE PYELOGRAM, URETEROSCOPY AND STENT PLACEMENT (Bilateral ) HOLMIUM LASER APPLICATION (Bilateral )  Patient Location: PACU  Anesthesia Type:General  Level of Consciousness: awake, alert  and oriented  Airway & Oxygen Therapy: Patient Spontanous Breathing and Patient connected to face mask oxygen  Post-op Assessment: Report given to RN and Post -op Vital signs reviewed and stable  Post vital signs: Reviewed and stable  Last Vitals:  Vitals Value Taken Time  BP 125/90 02/03/19 1000  Temp 36.7 C 02/03/19 0959  Pulse 78 02/03/19 1000  Resp 13 02/03/19 1000  SpO2 100 % 02/03/19 1000  Vitals shown include unvalidated device data.  Last Pain:  Vitals:   02/03/19 0639  TempSrc:   PainSc: 0-No pain      Patients Stated Pain Goal: 4 (60/63/01 6010)  Complications: No apparent anesthesia complications

## 2019-02-03 NOTE — Anesthesia Postprocedure Evaluation (Signed)
Anesthesia Post Note  Patient: Uziel Covault  Procedure(s) Performed: CYSTOSCOPY WITH RETROGRADE PYELOGRAM, URETEROSCOPY AND STENT PLACEMENT (Bilateral ) HOLMIUM LASER APPLICATION (Bilateral )     Patient location during evaluation: PACU Anesthesia Type: General Level of consciousness: awake and alert Pain management: pain level controlled Vital Signs Assessment: post-procedure vital signs reviewed and stable Respiratory status: spontaneous breathing, nonlabored ventilation and respiratory function stable Cardiovascular status: blood pressure returned to baseline and stable Postop Assessment: no apparent nausea or vomiting Anesthetic complications: no    Last Vitals:  Vitals:   02/03/19 1130 02/03/19 1230  BP: (!) 136/92 (!) 136/94  Pulse: 76 73  Resp: 16 17  Temp:    SpO2: 100% 98%    Last Pain:  Vitals:   02/03/19 1330  TempSrc:   PainSc: 4                  Catalina Gravel

## 2019-02-04 ENCOUNTER — Encounter (HOSPITAL_COMMUNITY): Payer: Self-pay | Admitting: Urology

## 2019-02-05 ENCOUNTER — Other Ambulatory Visit: Payer: Self-pay | Admitting: Urology

## 2019-02-05 MED ORDER — TAMSULOSIN HCL 0.4 MG PO CAPS: 0.4000 mg | ORAL_CAPSULE | Freq: Every day | ORAL | 0 refills | Status: AC

## 2019-02-05 MED ORDER — OXYBUTYNIN CHLORIDE 5 MG PO TABS: 5.0000 mg | ORAL_TABLET | Freq: Three times a day (TID) | ORAL | 0 refills | Status: DC

## 2019-02-05 NOTE — Progress Notes (Signed)
Patient called with pain consistent with bladder spasms. Provided reassurance, sent in medications to his pharmacy

## 2019-05-12 ENCOUNTER — Other Ambulatory Visit: Payer: Self-pay | Admitting: Family Medicine

## 2019-09-06 ENCOUNTER — Other Ambulatory Visit: Payer: Self-pay

## 2019-09-06 ENCOUNTER — Ambulatory Visit (INDEPENDENT_AMBULATORY_CARE_PROVIDER_SITE_OTHER): Payer: 59 | Admitting: Family Medicine

## 2019-09-06 ENCOUNTER — Encounter: Payer: Self-pay | Admitting: Family Medicine

## 2019-09-06 VITALS — BP 130/80 | HR 83 | Temp 98.0°F | Wt 211.2 lb

## 2019-09-06 DIAGNOSIS — M940 Chondrocostal junction syndrome [Tietze]: Secondary | ICD-10-CM | POA: Diagnosis not present

## 2019-09-06 MED ORDER — AMLODIPINE BESYLATE 5 MG PO TABS: ORAL_TABLET | ORAL | 3 refills | Status: DC

## 2019-09-06 NOTE — Progress Notes (Signed)
   Subjective:    Patient ID: Marcus Pruitt, male    DOB: August 10, 1970, 49 y.o.   MRN: [id number]  HPI Here for 5 days of intermittent sharp mild pains in the right chest area. Sometimes these radiate up to the shoulder, but never down the arm. He can make the pain worse by twisting his trunk a certain way. No SOB or heartburn. Ibuprofen helps.    Review of Systems  Constitutional: Negative.   Respiratory: Negative.   Cardiovascular: Positive for chest pain. Negative for palpitations and leg swelling.  Gastrointestinal: Negative.        Objective:   Physical Exam Constitutional:      Appearance: Normal appearance. He is well-developed. He is not ill-appearing.  Cardiovascular:     Rate and Rhythm: Normal rate and regular rhythm.     Pulses: Normal pulses.     Heart sounds: Normal heart sounds.  Pulmonary:     Effort: Pulmonary effort is normal.     Breath sounds: Normal breath sounds.     Comments: He is mildly tender along the right sternal border  Musculoskeletal:     Cervical back: Normal range of motion. No rigidity.  Neurological:     Mental Status: He is alert.           Assessment & Plan:  Costochondritis, treat with heat and Ibuprofen. Recheck prn. Alysia Penna, MD

## 2019-09-13 ENCOUNTER — Other Ambulatory Visit (INDEPENDENT_AMBULATORY_CARE_PROVIDER_SITE_OTHER): Payer: 59

## 2019-09-13 ENCOUNTER — Telehealth (INDEPENDENT_AMBULATORY_CARE_PROVIDER_SITE_OTHER): Payer: 59 | Admitting: Family Medicine

## 2019-09-13 ENCOUNTER — Other Ambulatory Visit: Payer: Self-pay

## 2019-09-13 DIAGNOSIS — R1031 Right lower quadrant pain: Secondary | ICD-10-CM

## 2019-09-13 NOTE — Progress Notes (Signed)
Virtual Visit via Video Note  I connected with the patient on 09/13/19 at  3:15 PM EDT by a video enabled telemedicine application and verified that I am speaking with the correct person using two identifiers.  Location patient: home Location provider:work or home office Persons participating in the virtual visit: patient, provider  I discussed the limitations of evaluation and management by telemedicine and the availability of in person appointments. The patient expressed understanding and agreed to proceed.   HPI: Here for pains on the right side of his body. These began about 10 days ago and at first they involved the right chest area. The pains were sharp and sometimes intense, and they would last 5-10 minutes at a time. We had a virtual visit one week ago and we decided he could use Ibuprofen for these. This would give him brief and mild relief. Since then the pains have moved to involve the right lower abdomen, the right flank, and the right middle back area. He has trid Tylenol and Naproxen with mixed results. No urinary or bowel changes. No fever. His appetite is normal. No nausea or vomiting.    ROS: See pertinent positives and negatives per HPI.  Past Medical History:  Diagnosis Date  . Allergy   . Blood transfusion without reported diagnosis   . Clotting disorder ([place])    RESOLVED NOW PER PT  . H/O blood clots    mesenteric  . Hearing loss in right ear   . Hypertension   . Low back pain   . Prostate cancer ([place])   . Sleep apnea    on CPAP  . Small intestinal bacterial overgrowth 09/22/2017   + Breath test 08/2017  . 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 Tinnitus    RIGHT EAR    Past Surgical History:  Procedure Laterality Date  . ABDOMINAL WOUND DEHISCENCE N/A 05/20/2017   Procedure: REPAIR OF ABDOMINAL WOUND DEHISCENCE AND EVACUATION OF HEMATOMA;  Surgeon: Angelia Mould, MD;  Location: [facility];  Service: Vascular;  Laterality: N/A;  .  AORTOGRAM N/A 05/15/2017   Procedure: MESENTERIC AORTOGRAM;  Surgeon: Conrad Guernsey, MD;  Location: [facility];  Service: Vascular;  Laterality: N/A;  . COLONOSCOPY    . CYSTOSCOPY WITH RETROGRADE PYELOGRAM, URETEROSCOPY AND STENT PLACEMENT Bilateral 02/03/2019   Procedure: CYSTOSCOPY WITH RETROGRADE PYELOGRAM, URETEROSCOPY AND STENT PLACEMENT;  Surgeon: Alexis Frock, MD;  [facility];  Service: Urology;  Laterality: Bilateral;  75 MINS  . GIVENS CAPSULE STUDY N/A 08/26/2017   Procedure: GIVENS CAPSULE STUDY;  Surgeon: Gatha Mayer, MD;  Location: [facility] ENDOSCOPY;  Service: Endoscopy;  Laterality: N/A;  . HOLMIUM LASER APPLICATION Bilateral [id number]   Procedure: HOLMIUM LASER APPLICATION;  Surgeon: Alexis Frock, MD;  [facility];  Service: Urology;  Laterality: Bilateral;  . INGUINAL HERNIA REPAIR Left 02/21/2016   Procedure: LAPAROSCOPIC INGUINAL HERNIA;  Surgeon: Alexis Frock, MD;  [facility];  Service: Urology;  Laterality: Left;  . LYMPHADENECTOMY Bilateral 02/21/2016   Procedure: PELVIC LYMPHADENECTOMY;  Surgeon: Alexis Frock, MD;  [facility];  Service: Urology;  Laterality: Bilateral;  . MANDIBLE SURGERY  1990  . MESENTERIC ARTERY BYPASS N/A 05/15/2017   Procedure: OPEN FENESTRATION SUPERIOR MESENTERIC ARTERY;  Surgeon: Conrad Holden, MD;  [facility];  Service: Vascular;  Laterality: N/A;  . PATCH ANGIOPLASTY N/A 05/15/2017   Procedure: SUPERIOR MESENTERIC ARTERY PATCH ANGIOPLASTY USING PERI-GUARD PATCH;  Surgeon: Conrad June Lake, MD;  Location: [place];  Service: Vascular;  Laterality: N/A;  . PERCUTANEOUS VENOUS THROMBECTOMY,LYSIS WITH INTRAVASCULAR ULTRASOUND (IVUS) N/A 05/15/2017   Procedure: THROMBECTOMY OF SUPERIOR MESENTERIC ARTERY;  Surgeon: Conrad Molino, MD;  Location: [place];  Service: Vascular;  Laterality: N/A;  . ROBOT ASSISTED LAPAROSCOPIC RADICAL PROSTATECTOMY N/A 02/21/2016   Procedure: XI ROBOTIC ASSISTED LAPAROSCOPIC RADICAL PROSTATECTOMY;  Surgeon:  Alexis Frock, MD;  [facility];  Service: Urology;  Laterality: N/A;  . UMBILICAL HERNIA REPAIR  02/21/2016   Procedure: LAPAROSCOPIC UMBILICAL HERNIA;  Surgeon: Alexis Frock, MD;  [facility];  Service: Urology;;  . [name]  07/29/2017   Procedure: Visceral Angiogram;  Surgeon: Conrad Alamo, MD;  Location: [place] [facility];  Service: Cardiovascular;;    Family History  Problem Relation Age of Onset  . Heart disease Maternal Grandmother   . Diabetes Other        fhx  . Prostate cancer Father   . Hypertension Mother   . Stroke Mother   . Colon cancer Neg Hx   . Colon polyps Neg Hx   . Esophageal cancer Neg Hx   . Rectal cancer Neg Hx   . Stomach cancer Neg Hx      Current Outpatient Medications:  .  Acetaminophen (TYLENOL PO), Take by mouth., Disp: , Rfl:  .  amLODipine (NORVASC) 5 MG tablet, TAKE 1 TABLET(5 MG) BY MOUTH DAILY, Disp: 90 tablet, Rfl: 3 .  cholestyramine light (PREVALITE) 4 g packet, Take 1 packet (4 g total) by mouth See admin instructions. Dissolve 4 grams into 2 ounces of water and drink two times a day-  to reduce elevated serum cholesterol (Patient taking differently: Take 4 g by mouth 2 (two) times daily. ), Disp: 60 packet, Rfl: 5 .  ketorolac (TORADOL) 10 MG tablet, Take 1 tablet (10 mg total) by mouth every 8 (eight) hours as needed for moderate pain (or stent discomfort post-operatively)., Disp: 20 tablet, Rfl: 0 .  oxybutynin (DITROPAN) 5 MG tablet, Take 1 tablet (5 mg total) by mouth 3 (three) times daily., Disp: 21 tablet, Rfl: 0 .  oxyCODONE-acetaminophen (PERCOCET) 5-325 MG tablet, Take 1-2 tablets by mouth every 8 (eight) hours as needed for severe pain. Post-operatively, Disp: 15 tablet, Rfl: 0 .  senna-docusate (SENOKOT-S) 8.6-50 MG tablet, Take 1 tablet by mouth 2 (two) times daily. While taking strongest pain meds to prevent constipation, Disp: 20 tablet, Rfl: 0  EXAM:  VITALS per patient if applicable:  GENERAL: alert,  oriented, appears well and in no acute distress  HEENT: atraumatic, conjunttiva clear, no obvious abnormalities on inspection of external nose and ears  NECK: normal movements of the head and neck  LUNGS: on inspection no signs of respiratory distress, breathing rate appears normal, no obvious gross SOB, gasping or wheezing  CV: no obvious cyanosis  MS: moves all visible extremities without noticeable abnormality  PSYCH/NEURO: pleasant and cooperative, no obvious depression or anxiety, speech and thought processing grossly intact  ASSESSMENT AND PLAN: Here with right sided trunk pains that started in the right chest area and have since centered in the right lower abdomen. Appendicitis and gall bladder disease are two likely explanations. He will drink plenty of water. He will come by our lab this afternoon for a BMET. CBC, etc. Set up a contrasted CT of the abdomen and pelvis asap. He knows to go to the ER tonight if anything changes.  Alysia Penna, MD  Discussed the following assessment and plan:  RLQ abdominal pain -  Plan: CBC with Differential/Platelet, Basic metabolic panel, Hepatic function panel, Urinalysis, CT Abdomen Pelvis W Contrast     I discussed the assessment and treatment plan with the patient. The patient was provided an opportunity to ask questions and all were answered. The patient agreed with the plan and demonstrated an understanding of the instructions.   The patient was advised to call back or seek an in-person evaluation if the symptoms worsen or if the condition fails to improve as anticipated.

## 2019-09-14 ENCOUNTER — Ambulatory Visit
Admission: RE | Admit: 2019-09-14 | Discharge: 2019-09-14 | Disposition: A | Discharge status: Home / Self Care | Payer: 59 | Source: Ambulatory Visit | Attending: Family Medicine | Admitting: Family Medicine

## 2019-09-14 DIAGNOSIS — R1031 Right lower quadrant pain: Secondary | ICD-10-CM

## 2019-09-14 LAB — HEPATIC FUNCTION PANEL
ALT: 33 U/L (ref 0–53)
AST: 28 U/L (ref 0–37)
Albumin: 4.2 g/dL (ref 3.5–5.2)
Alkaline Phosphatase: 57 U/L (ref 39–117)
Bilirubin, Direct: 0.1 mg/dL (ref 0.0–0.3)
Total Bilirubin: 0.7 mg/dL (ref 0.2–1.2)
Total Protein: 7.2 g/dL (ref 6.0–8.3)

## 2019-09-14 LAB — BASIC METABOLIC PANEL
BUN: 10 mg/dL (ref 6–23)
CO2: 31 mEq/L (ref 19–32)
Calcium: 9.4 mg/dL (ref 8.4–10.5)
Chloride: 104 mEq/L (ref 96–112)
Creatinine, Ser: 1.02 mg/dL (ref 0.40–1.50)
GFR: 93.86 mL/min (ref >60.00)
Glucose, Bld: 88 mg/dL (ref 70–99)
Potassium: 4 mEq/L (ref 3.5–5.1)
Sodium: 139 mEq/L (ref 135–145)

## 2019-09-14 LAB — CBC WITH DIFFERENTIAL/PLATELET
Basophils Absolute: 0 10*3/uL (ref 0.0–0.1)
Basophils Relative: 0.5 % (ref 0.0–3.0)
Eosinophils Absolute: 0.4 10*3/uL (ref 0.0–0.7)
Eosinophils Relative: 7.6 % — ABNORMAL HIGH (ref 0.0–5.0)
HCT: 42.9 % (ref 39.0–52.0)
Hemoglobin: 14.1 g/dL (ref 13.0–17.0)
Lymphocytes Relative: 30.7 % (ref 12.0–46.0)
Lymphs Abs: 1.6 10*3/uL (ref 0.7–4.0)
MCHC: 32.9 g/dL (ref 30.0–36.0)
MCV: 85.9 fl (ref 78.0–100.0)
Monocytes Absolute: 0.6 10*3/uL (ref 0.1–1.0)
Monocytes Relative: 11.5 % (ref 3.0–12.0)
Neutro Abs: 2.5 10*3/uL (ref 1.4–7.7)
Neutrophils Relative %: 49.7 % (ref 43.0–77.0)
Platelets: 210 10*3/uL (ref 150.0–400.0)
RBC: 4.99 Mil/uL (ref 4.22–5.81)
RDW: 14.6 % (ref 11.5–15.5)
WBC: 5.1 10*3/uL (ref 4.0–10.5)

## 2019-09-14 LAB — URINALYSIS
Bilirubin Urine: NEGATIVE
Hgb urine dipstick: NEGATIVE
Ketones, ur: NEGATIVE
Leukocytes,Ua: NEGATIVE
Nitrite: NEGATIVE
Specific Gravity, Urine: 1.025 (ref 1.000–1.030)
Total Protein, Urine: NEGATIVE
Urine Glucose: NEGATIVE
Urobilinogen, UA: 0.2 (ref 0.0–1.0)
pH: 6 (ref 5.0–8.0)

## 2019-09-14 MED ORDER — IOPAMIDOL (ISOVUE-300) INJECTION 61%
100.0000 mL | Freq: Once | INTRAVENOUS | Status: AC | PRN
  Administered 2019-09-14: 100 mL via INTRAVENOUS

## 2019-11-09 ENCOUNTER — Encounter: Payer: Self-pay | Admitting: Family Medicine

## 2019-11-09 NOTE — Telephone Encounter (Signed)
Yes it would be safe for him to try the keto diet for awhile. I would suggest we do some lab work however after he has been on this for 6 months

## 2019-11-16 ENCOUNTER — Other Ambulatory Visit: Payer: Self-pay | Admitting: Internal Medicine

## 2019-12-15 ENCOUNTER — Other Ambulatory Visit: Payer: Self-pay | Admitting: Internal Medicine

## 2019-12-21 ENCOUNTER — Encounter: Payer: Self-pay | Admitting: Family Medicine

## 2019-12-26 NOTE — Telephone Encounter (Signed)
It is hard to say if these are safe because there is no research to go by, but I would recommend he NOT take them due to his HTN

## 2020-02-05 ENCOUNTER — Other Ambulatory Visit: Payer: Self-pay | Admitting: Internal Medicine

## 2020-03-02 ENCOUNTER — Encounter: Payer: Self-pay | Admitting: Family Medicine

## 2020-03-05 ENCOUNTER — Ambulatory Visit: Payer: Self-pay | Attending: Internal Medicine

## 2020-03-05 DIAGNOSIS — Z23 Encounter for immunization: Secondary | ICD-10-CM

## 2020-03-05 NOTE — Telephone Encounter (Signed)
Call in an EpiPen 2 pack with 2 rf

## 2020-03-05 NOTE — Progress Notes (Signed)
   Covid-19 Vaccination Clinic  Name:  Garv Kuechle    MRN: [id number] DOB: 11-16-1970  03/05/2020  Mr. Sacra was observed post Covid-19 immunization for 15 minutes without incident. He was provided with Vaccine Information Sheet and instruction to access the V-Safe system.   Mr. Arth was instructed to call 911 with any severe reactions post vaccine: Marland Kitchen Difficulty breathing  . Swelling of face and throat  . A fast heartbeat  . A bad rash all over body  . Dizziness and weakness   Immunizations Administered    Name Date Dose VIS Date Route   Moderna COVID-19 Vaccine 03/05/2020 11:56 AM 0.5 mL 05/2019 Intramuscular   Manufacturer: Moderna   Lot: [id number]   [place]: [id number]

## 2020-03-06 MED ORDER — EPINEPHRINE 0.3 MG/0.3ML IJ SOAJ: 0.3000 mg | INTRAMUSCULAR | 2 refills | Status: AC | PRN

## 2020-03-25 ENCOUNTER — Other Ambulatory Visit: Payer: Self-pay

## 2020-03-25 ENCOUNTER — Ambulatory Visit: Payer: 59 | Admitting: Family Medicine

## 2020-03-25 ENCOUNTER — Encounter: Payer: Self-pay | Admitting: Family Medicine

## 2020-03-25 VITALS — BP 140/94 | HR 84 | Temp 98.8°F | Ht 67.0 in | Wt 212.2 lb

## 2020-03-25 DIAGNOSIS — Z0000 Encounter for general adult medical examination without abnormal findings: Secondary | ICD-10-CM

## 2020-03-25 MED ORDER — TEMAZEPAM 30 MG PO CAPS: 30.0000 mg | ORAL_CAPSULE | Freq: Every evening | ORAL | 2 refills | Status: DC | PRN

## 2020-03-25 NOTE — Progress Notes (Signed)
Subjective:    Patient ID: Marcus Pruitt, male    DOB: 1971-05-10, 49 y.o.   MRN: [id number]  HPI Here for a well exam. He is doing well except for some stress issue.s he has a stressful job, and he has also been dealing with the deaths of his mother and grandmother this summer. He has been seeing a therapist at the [place] and they have referred him to a psychiatrist as well. He has trouble sleeping at night and this makes him tired and sleepy during the day. His diarrhea is well controlled with Cholestyramine. He sees Dr. Tresa Moore for regular urologic exams and his last PSA was undetectable.    Review of Systems  Constitutional: Positive for fatigue.  HENT: Negative.   Eyes: Negative.   Respiratory: Negative.   Cardiovascular: Negative.   Gastrointestinal: Negative.   Genitourinary: Negative.   Musculoskeletal: Negative.   Skin: Negative.   Neurological: Negative.   Psychiatric/Behavioral: Positive for dysphoric mood and sleep disturbance. Negative for agitation, behavioral problems, confusion and decreased concentration. The patient is nervous/anxious.        Objective:   Physical Exam Constitutional:      General: He is not in acute distress.    Appearance: He is well-developed. He is not diaphoretic.  HENT:     Head: Normocephalic and atraumatic.     Right Ear: External ear normal.     Left Ear: External ear normal.     Nose: Nose normal.     Mouth/Throat:     Pharynx: No oropharyngeal exudate.  Eyes:     General: No scleral icterus.       Right eye: No discharge.        Left eye: No discharge.     Conjunctiva/sclera: Conjunctivae normal.     Pupils: Pupils are equal, round, and reactive to light.  Neck:     Thyroid: No thyromegaly.     Vascular: No JVD.     Trachea: No tracheal deviation.  Cardiovascular:     Rate and Rhythm: Normal rate and regular rhythm.     Heart sounds: Normal heart sounds. No murmur heard.  No friction rub. No gallop.   Pulmonary:      Effort: Pulmonary effort is normal. No respiratory distress.     Breath sounds: Normal breath sounds. No wheezing or rales.  Chest:     Chest wall: No tenderness.  Abdominal:     General: Bowel sounds are normal. There is no distension.     Palpations: Abdomen is soft. There is no mass.     Tenderness: There is no abdominal tenderness. There is no guarding or rebound.  Genitourinary:    Penis: No tenderness.   Musculoskeletal:        General: No tenderness. Normal range of motion.     Cervical back: Neck supple.  Lymphadenopathy:     Cervical: No cervical adenopathy.  Skin:    General: Skin is warm and dry.     Coloration: Skin is not pale.     Findings: No erythema or rash.  Neurological:     Mental Status: He is alert and oriented to person, place, and time.     Cranial Nerves: No cranial nerve deficit.     Motor: No abnormal muscle tone.     Coordination: Coordination normal.     Deep Tendon Reflexes: Reflexes are normal and symmetric. Reflexes normal.  Psychiatric:        Behavior: Behavior normal.  Thought Content: Thought content normal.        Judgment: Judgment normal.           Assessment & Plan:  Well exam. We discussed diet and exercise. Get fasting labs soon. For sleep he will try Temazepam 30 mg qhs.  Alysia Penna, MD

## 2020-03-28 ENCOUNTER — Other Ambulatory Visit: Payer: Self-pay

## 2020-03-29 ENCOUNTER — Other Ambulatory Visit (INDEPENDENT_AMBULATORY_CARE_PROVIDER_SITE_OTHER): Payer: 59

## 2020-03-29 ENCOUNTER — Other Ambulatory Visit: Payer: 59

## 2020-03-29 DIAGNOSIS — Z0000 Encounter for general adult medical examination without abnormal findings: Secondary | ICD-10-CM | POA: Diagnosis not present

## 2020-03-30 LAB — BASIC METABOLIC PANEL
BUN: 11 mg/dL (ref 7–25)
CO2: 27 mmol/L (ref 20–32)
Calcium: 9.3 mg/dL (ref 8.6–10.3)
Chloride: 102 mmol/L (ref 98–110)
Creat: 1.03 mg/dL (ref 0.60–1.35)
Glucose, Bld: 83 mg/dL (ref 65–99)
Potassium: 3.8 mmol/L (ref 3.5–5.3)
Sodium: 137 mmol/L (ref 135–146)

## 2020-03-30 LAB — CBC WITH DIFFERENTIAL/PLATELET
Absolute Monocytes: 334 cells/uL (ref 200–950)
Basophils Absolute: 19 cells/uL (ref 0–200)
Basophils Relative: 0.5 %
Eosinophils Absolute: 255 cells/uL (ref 15–500)
Eosinophils Relative: 6.7 %
HCT: 44.6 % (ref 38.5–50.0)
Hemoglobin: 14.8 g/dL (ref 13.2–17.1)
Lymphs Abs: 1288 cells/uL (ref 850–3900)
MCH: 27.7 pg (ref 27.0–33.0)
MCHC: 33.2 g/dL (ref 32.0–36.0)
MCV: 83.4 fL (ref 80.0–100.0)
MPV: 10.6 fL (ref 7.5–12.5)
Monocytes Relative: 8.8 %
Neutro Abs: 1904 cells/uL (ref 1500–7800)
Neutrophils Relative %: 50.1 %
Platelets: 212 10*3/uL (ref 140–400)
RBC: 5.35 10*6/uL (ref 4.20–5.80)
RDW: 14.7 % (ref 11.0–15.0)
Total Lymphocyte: 33.9 %
WBC: 3.8 10*3/uL (ref 3.8–10.8)

## 2020-03-30 LAB — HEPATIC FUNCTION PANEL
AG Ratio: 1.4 (calc) (ref 1.0–2.5)
ALT: 24 U/L (ref 9–46)
AST: 21 U/L (ref 10–40)
Albumin: 4.3 g/dL (ref 3.6–5.1)
Alkaline phosphatase (APISO): 51 U/L (ref 36–130)
Bilirubin, Direct: 0.1 mg/dL (ref 0.0–0.2)
Globulin: 3.1 g/dL (calc) (ref 1.9–3.7)
Indirect Bilirubin: 0.6 mg/dL (calc) (ref 0.2–1.2)
Total Bilirubin: 0.7 mg/dL (ref 0.2–1.2)
Total Protein: 7.4 g/dL (ref 6.1–8.1)

## 2020-03-30 LAB — LIPID PANEL
Cholesterol: 146 mg/dL (ref <200)
HDL: 44 mg/dL (ref >=40)
LDL Cholesterol (Calc): 68 mg/dL (calc)
Non-HDL Cholesterol (Calc): 102 mg/dL (calc) (ref <130)
Total CHOL/HDL Ratio: 3.3 (calc) (ref <5.0)
Triglycerides: 262 mg/dL — ABNORMAL HIGH (ref <150)

## 2020-03-30 LAB — TSH: TSH: 1.26 mIU/L (ref 0.40–4.50)

## 2020-04-02 ENCOUNTER — Other Ambulatory Visit: Payer: 59

## 2020-04-02 ENCOUNTER — Encounter: Payer: Self-pay | Admitting: Family Medicine

## 2020-04-02 ENCOUNTER — Ambulatory Visit: Payer: Self-pay | Attending: Internal Medicine

## 2020-04-02 DIAGNOSIS — Z23 Encounter for immunization: Secondary | ICD-10-CM

## 2020-04-02 NOTE — Progress Notes (Signed)
   Covid-19 Vaccination Clinic  Name:  Marcus Pruitt    MRN: [id number] DOB: Oct 30, 1970  04/02/2020  Marcus Pruitt was observed post Covid-19 immunization for 15 minutes without incident. He was provided with Vaccine Information Sheet and instruction to access the V-Safe system.   Marcus Pruitt was instructed to call 911 with any severe reactions post vaccine: Marland Kitchen Difficulty breathing  . Swelling of face and throat  . A fast heartbeat  . A bad rash all over body  . Dizziness and weakness   Immunizations Administered    Name Date Dose VIS Date Route   Moderna COVID-19 Vaccine 04/02/2020  9:15 AM 0.5 mL 05/2019 Intramuscular   Manufacturer: Moderna   Lot: [id number]   [place]: [id number]

## 2020-04-04 NOTE — Telephone Encounter (Signed)
Yes but I do not think we need to start on a medication for this yet. Watch the carbs in the diet (bread, pasta, potatoes) and we can check this again in 3 months

## 2020-06-11 ENCOUNTER — Other Ambulatory Visit: Payer: Self-pay | Admitting: Internal Medicine

## 2020-07-11 ENCOUNTER — Other Ambulatory Visit: Payer: Self-pay | Admitting: Internal Medicine

## 2020-08-15 ENCOUNTER — Other Ambulatory Visit: Payer: Self-pay | Admitting: Internal Medicine

## 2020-08-23 ENCOUNTER — Ambulatory Visit: Payer: 59 | Admitting: Family Medicine

## 2020-08-31 ENCOUNTER — Other Ambulatory Visit: Payer: Self-pay | Admitting: Family Medicine

## 2020-10-25 ENCOUNTER — Encounter: Payer: Self-pay | Admitting: Family Medicine

## 2020-10-25 NOTE — Telephone Encounter (Signed)
That is NOT true. Everyone should get the shingles shot regardless if you have had these or not

## 2020-10-27 ENCOUNTER — Encounter: Payer: Self-pay | Admitting: Family Medicine

## 2020-10-30 MED ORDER — CONTRAVE 8-90 MG PO TB12: ORAL_TABLET | ORAL | 2 refills | Status: DC

## 2020-10-30 NOTE — Telephone Encounter (Signed)
I sent in a 3 month supply

## 2020-10-30 NOTE — Telephone Encounter (Signed)
I think it would be fine for him to try it. It may be expensive though. Let me know if he wants a rx for it

## 2020-11-13 ENCOUNTER — Encounter: Payer: Self-pay | Admitting: Family Medicine

## 2020-11-13 NOTE — Telephone Encounter (Signed)
Yes if the Covid test is negative, set up an in person OV for him

## 2020-12-01 ENCOUNTER — Encounter: Payer: Self-pay | Admitting: Family Medicine

## 2020-12-01 DIAGNOSIS — R109 Unspecified abdominal pain: Secondary | ICD-10-CM

## 2020-12-02 NOTE — Telephone Encounter (Signed)
The referral was done  

## 2020-12-05 ENCOUNTER — Other Ambulatory Visit: Payer: Self-pay | Admitting: Family Medicine

## 2020-12-05 NOTE — Telephone Encounter (Signed)
Pt needs appointment for further refills 

## 2021-02-10 ENCOUNTER — Encounter: Payer: Self-pay | Admitting: Family Medicine

## 2021-02-10 ENCOUNTER — Other Ambulatory Visit: Payer: Self-pay | Admitting: Internal Medicine

## 2021-02-17 NOTE — Telephone Encounter (Signed)
I think it would be okay for him to use this clinic, but he should keep a close watch on his BP

## 2021-03-05 ENCOUNTER — Other Ambulatory Visit: Payer: Self-pay | Admitting: Family Medicine

## 2021-03-05 NOTE — Telephone Encounter (Signed)
3o tabs sent in. Pt needs an appt for CPE.

## 2021-03-12 ENCOUNTER — Encounter: Payer: Self-pay | Admitting: Internal Medicine

## 2021-03-15 ENCOUNTER — Other Ambulatory Visit: Payer: Self-pay | Admitting: Internal Medicine

## 2021-04-01 ENCOUNTER — Encounter: Payer: Self-pay | Admitting: Internal Medicine

## 2021-04-21 ENCOUNTER — Other Ambulatory Visit: Payer: Self-pay | Admitting: Family Medicine

## 2021-04-23 ENCOUNTER — Other Ambulatory Visit: Payer: Self-pay

## 2021-04-23 ENCOUNTER — Encounter: Payer: Self-pay | Admitting: Family Medicine

## 2021-04-23 ENCOUNTER — Ambulatory Visit: Payer: 59 | Admitting: Family Medicine

## 2021-04-23 VITALS — BP 118/80 | HR 64 | Temp 98.5°F | Wt 205.0 lb

## 2021-04-23 DIAGNOSIS — I10 Essential (primary) hypertension: Secondary | ICD-10-CM

## 2021-04-23 MED ORDER — AMLODIPINE BESYLATE 5 MG PO TABS: 5.0000 mg | ORAL_TABLET | Freq: Every day | ORAL | 3 refills | Status: DC

## 2021-04-23 NOTE — Progress Notes (Signed)
   Subjective:    Patient ID: Marcus Pruitt, male    DOB: 19-Aug-1970, 50 y.o.   MRN: [id number]  HPI Here to follow up on HTN. His BP has been well controlled and he feels great. He is seeing the [facility] weight loss clinic, and he has lost 7 lbs since last year. Has 5 small meals a day, high on protein and low on carbs. He is starting to exercise as well.    Review of Systems  Constitutional: Negative.   Respiratory: Negative.    Cardiovascular: Negative.   Neurological: Negative.       Objective:   Physical Exam Constitutional:      Appearance: Normal appearance.  Cardiovascular:     Rate and Rhythm: Normal rate and regular rhythm.     Pulses: Normal pulses.     Heart sounds: Normal heart sounds.  Pulmonary:     Effort: Pulmonary effort is normal.     Breath sounds: Normal breath sounds.  Musculoskeletal:     Right lower leg: No edema.     Left lower leg: No edema.  Neurological:     Mental Status: He is alert.          Assessment & Plan:  HTN is stable. Refilled the Amlodipine. He recently had labs drawn at the [place], ,and he will get [place] a copy of these results.  Alysia Penna, MD

## 2021-05-05 ENCOUNTER — Encounter: Payer: Self-pay | Admitting: Family Medicine

## 2021-05-07 ENCOUNTER — Other Ambulatory Visit: Payer: Self-pay | Admitting: General Surgery

## 2021-05-07 DIAGNOSIS — K432 Incisional hernia without obstruction or gangrene: Secondary | ICD-10-CM

## 2021-05-20 ENCOUNTER — Other Ambulatory Visit: Payer: Self-pay | Admitting: Internal Medicine

## 2021-05-30 ENCOUNTER — Ambulatory Visit
Admission: RE | Admit: 2021-05-30 | Discharge: 2021-05-30 | Disposition: A | Discharge status: Home / Self Care | Payer: 59 | Source: Ambulatory Visit | Attending: General Surgery | Admitting: General Surgery

## 2021-05-30 DIAGNOSIS — K432 Incisional hernia without obstruction or gangrene: Secondary | ICD-10-CM

## 2021-06-09 ENCOUNTER — Encounter: Payer: Self-pay | Admitting: Internal Medicine

## 2021-06-10 NOTE — Telephone Encounter (Signed)
Pt stated that he needed to rescheduled appointment that was scheduled on Friday. Pt was rescheduled to 07/22/2021 @ 8:30 with Dr. Carlean Purl.  Pt made aware.  Pt verbalized understanding with all questions answered.

## 2021-06-12 ENCOUNTER — Ambulatory Visit: Payer: 59 | Admitting: Internal Medicine

## 2021-06-20 ENCOUNTER — Encounter: Payer: Self-pay | Admitting: Family Medicine

## 2021-06-20 ENCOUNTER — Telehealth (INDEPENDENT_AMBULATORY_CARE_PROVIDER_SITE_OTHER): Payer: 59 | Admitting: Family Medicine

## 2021-06-20 DIAGNOSIS — U071 COVID-19: Secondary | ICD-10-CM | POA: Insufficient documentation

## 2021-06-20 MED ORDER — HYDROCODONE BIT-HOMATROP MBR 5-1.5 MG/5ML PO SOLN: 5.0000 mL | ORAL | 0 refills | Status: DC | PRN

## 2021-06-20 MED ORDER — MOLNUPIRAVIR EUA 200MG CAPSULE: 4.0000 | ORAL_CAPSULE | Freq: Two times a day (BID) | ORAL | 0 refills | Status: AC

## 2021-06-20 NOTE — Telephone Encounter (Signed)
Pt visit was completed by Dr Sarajane Jews

## 2021-06-20 NOTE — Progress Notes (Signed)
Subjective:    Patient ID: Marcus Pruitt, male    DOB: [birth year], 50 y.o.   MRN: [id number]  HPI Virtual Visit via Video Note  I connected with the patient on 06/20/21 at 10:30 AM EST by a video enabled telemedicine application and verified that I am speaking with the correct person using two identifiers.  Location patient: home Location provider:work or home office Persons participating in the virtual visit: patient, provider  I discussed the limitations of evaluation and management by telemedicine and the availability of in person appointments. The patient expressed understanding and agreed to proceed.   HPI: Here for a Covid-19 infection that started 4 days ago. He has had ST, body aches, and a dry cough. No SOB or NVD. He tested positive yesterday.   ROS: See pertinent positives and negatives per HPI.  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 Carlean Purl, adenomatous polyp, repeat in 3 yrs    [place], URETEROSCOPY AND STENT PLACEMENT Bilateral 02/03/2019   Procedure:  [place], URETEROSCOPY AND STENT PLACEMENT;  Surgeon: Alexis Frock, MD;  [facility];  Service: Urology;  Laterality: Bilateral;  75 [place] STUDY N/A 08/26/2017   Procedure: GIVENS CAPSULE STUDY;  Surgeon: Gatha Mayer, MD;  Location: [place];  Service: Endoscopy;  Laterality: N/A;   HOLMIUM LASER APPLICATION Bilateral 07/30/3084   Procedure: HOLMIUM LASER APPLICATION;  Surgeon: Alexis Frock, MD;  [facility];  Service: Urology;  Laterality: Bilateral;   INGUINAL HERNIA REPAIR Left 02/21/2016   Procedure: LAPAROSCOPIC INGUINAL HERNIA;  Surgeon: Alexis Frock, MD;  [facility];  Service: Urology;  Laterality: Left;   LYMPHADENECTOMY Bilateral 02/21/2016   Procedure: PELVIC LYMPHADENECTOMY;  Surgeon: Alexis Frock, MD;  [facility];  Service: Urology;  Laterality: Bilateral;   MANDIBLE SURGERY  1990   MESENTERIC ARTERY BYPASS N/A 05/15/2017   Procedure: OPEN FENESTRATION SUPERIOR MESENTERIC ARTERY;  Surgeon: Conrad Spencer, MD;  Location: [facility] OR;  Service: Vascular;  Laterality: N/A;   PATCH ANGIOPLASTY N/A 05/15/2017   Procedure: SUPERIOR MESENTERIC ARTERY PATCH ANGIOPLASTY USING PERI-GUARD PATCH;  Surgeon: Conrad Cottageville, MD;  Location: [place];  Service: Vascular;  Laterality: N/A;   PERCUTANEOUS VENOUS THROMBECTOMY,LYSIS WITH INTRAVASCULAR ULTRASOUND (IVUS) N/A 05/15/2017   Procedure: THROMBECTOMY OF SUPERIOR MESENTERIC ARTERY;  Surgeon: Conrad Merrimac, MD;  Location: [place];  Service: Vascular;  Laterality: N/A;  ROBOT ASSISTED LAPAROSCOPIC RADICAL PROSTATECTOMY N/A 02/21/2016   Procedure: XI ROBOTIC ASSISTED LAPAROSCOPIC RADICAL PROSTATECTOMY;  Surgeon: Alexis Frock, MD;  [facility];  Service: Urology;  Laterality: N/A;   UMBILICAL HERNIA REPAIR  02/21/2016   Procedure: LAPAROSCOPIC UMBILICAL HERNIA;  Surgeon: Alexis Frock, MD;  [facility];  Service: Urology;;   [name]  07/29/2017   Procedure:  Visceral Angiogram;  Surgeon: Conrad Danforth, MD;  Location: [place] [facility];  Service: Cardiovascular;;    Family History  Problem Relation Age of Onset   Heart disease Maternal Grandmother    Diabetes Other        fhx   Prostate cancer Father    Hypertension Mother    Stroke Mother    Colon cancer Neg Hx    Colon polyps Neg Hx    Esophageal cancer Neg Hx    Rectal cancer Neg Hx    Stomach cancer Neg Hx      Current Outpatient Medications:    amLODipine (NORVASC) 5 MG tablet, Take 1 tablet (5 mg total) by mouth daily., Disp: 90 tablet, Rfl: 3   cholestyramine light (PREVALITE) 4 g packet, MIX AND DRINK 1 PACKET(4 GRAMS) BY MOUTH TWICE DAILY, Disp: 60 each, Rfl: 0   EPINEPHrine (EPIPEN 2-PAK) 0.3 mg/0.3 mL IJ SOAJ injection, Inject 0.3 mLs (0.3 mg total) into the muscle as needed for anaphylaxis., Disp: 1 each, Rfl: 2   HYDROcodone bit-homatropine (HYCODAN) 5-1.5 MG/5ML syrup, Take 5 mLs by mouth every 4 (four) hours as needed for cough., Disp: 240 mL, Rfl: 0   molnupiravir EUA (LAGEVRIO) 200 mg CAPS capsule, Take 4 capsules (800 mg total) by mouth 2 (two) times daily for 5 days., Disp: 40 capsule, Rfl: 0   Naltrexone-buPROPion HCl ER (CONTRAVE) 8-90 MG TB12, Take one tablet twice daily for 2 weeks, then take 2 tablets twice daily for 2 weeks after that, Disp: 120 tablet, Rfl: 2   temazepam (RESTORIL) 30 MG capsule, Take 1 capsule (30 mg total) by mouth at bedtime as needed for sleep., Disp: 30 capsule, Rfl: 2  EXAM:  VITALS per patient if applicable:  GENERAL: alert, oriented, appears well and in no acute distress  HEENT: atraumatic, conjunttiva clear, no obvious abnormalities on inspection of external nose and ears  NECK: normal movements of the head and neck  LUNGS: on inspection no signs of respiratory distress, breathing rate appears normal, no obvious gross SOB, gasping or wheezing  CV: no obvious cyanosis  MS: moves all visible extremities without noticeable  abnormality  PSYCH/NEURO: pleasant and cooperative, no obvious depression or anxiety, speech and thought processing grossly intact  ASSESSMENT AND PLAN: Covid-19 infection, treat with 5 days of Molnupiravir.  Alysia Penna, MD  Discussed the following assessment and plan:  No diagnosis found.     I discussed the assessment and treatment plan with the patient. The patient was provided an opportunity to ask questions and all were answered. The patient agreed with the plan and demonstrated an understanding of the instructions.   The patient was advised to call back or seek an in-person evaluation if the symptoms worsen or if the condition fails to improve as anticipated.      Review of Systems     Objective:   Physical Exam        Assessment & Plan:

## 2021-07-22 ENCOUNTER — Ambulatory Visit: Payer: 59 | Admitting: Internal Medicine

## 2021-07-22 ENCOUNTER — Encounter: Payer: Self-pay | Admitting: Internal Medicine

## 2021-07-22 VITALS — BP 120/74 | HR 73 | Ht 67.0 in | Wt 205.8 lb

## 2021-07-22 DIAGNOSIS — R143 Flatulence: Secondary | ICD-10-CM | POA: Diagnosis not present

## 2021-07-22 DIAGNOSIS — Z86010 Personal history of colonic polyps: Secondary | ICD-10-CM

## 2021-07-22 DIAGNOSIS — K529 Noninfective gastroenteritis and colitis, unspecified: Secondary | ICD-10-CM | POA: Diagnosis not present

## 2021-07-22 MED ORDER — NA SULFATE-K SULFATE-MG SULF 17.5-3.13-1.6 GM/177ML PO SOLN: 1.0000 | Freq: Once | ORAL | 0 refills | Status: AC

## 2021-07-22 NOTE — Progress Notes (Signed)
Marcus Pruitt 51 y.o. 17-Oct-1970 [id number]  Assessment & Plan:   Encounter Diagnoses  Name Primary?   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  Can try Beano, Devrom, simethicone for the gas consider charcoal inserts for the underwear  Since he is eating mostly plant-based he should take a multivitamin with B12 and also given a question of possible malabsorption AKA D and E and a multivitamin. However he has gained weight since 2018-19 so doubt sig malabsorption.  His stools are never seem oily or greasy, I had consider checking a fecal elastase but that does not seem correct as his diarrhea tends to be watery.  Bile salt malabsorption seems plausible but not entirely clear.  The risks and benefits as well as alternatives of endoscopic procedure(s) have been discussed and reviewed. All questions answered. The patient agrees to proceed.  CC: Laurey Morale, MD    Subjective:   Chief Complaint: History of colon polyp, chronic diarrhea  HPI 51 year old African-American man with a history of presumed injury to the small intestine after SMA dissection and repair by Dr. Bridgett Larsson.  I had seen him in 2019 and eventually after a work-up he ended up on cholestyramine.  A second opinion at [facility] suggested he do that plus take of vitamin with A D and E in it as well.  Also folate.  At that time he had been losing weight and the gastroenterologist thought perhaps it was a bile salt malabsorption only.  He has done well over time he finds it if he that had a KD and E and it as well.  Does not eat fatty meats or meat at all he tends not to have diarrhea.  He went on vegetables only diet or plants only diet for about 40 days and did not need his cholestyramine.  The cholestyramine can sometimes promote gaseousness and it can be malodorous.  High protein diets and the  cholestyramine may constipate him.  He had a 12 mm adenoma removed at colonoscopy in 2019 and is due for surveillance.   Wt Readings from Last 3 Encounters:  07/22/21 205 lb 12.8 oz (93.4 kg)  04/23/21 205 lb (93 kg)  03/25/20 212 lb 3.2 oz (96.3 kg)   In 2018-19 he was 80 kg  No Known Allergies Current Meds  Medication Sig   amLODipine (NORVASC) 5 MG tablet Take 1 tablet (5 mg total) by mouth daily.   cholestyramine light (PREVALITE) 4 g packet MIX AND DRINK 1 PACKET(4 GRAMS) BY MOUTH TWICE DAILY   EPINEPHrine (EPIPEN 2-PAK) 0.3 mg/0.3 mL IJ SOAJ injection Inject 0.3 mLs (0.3 mg total) into the muscle as needed for anaphylaxis.   Na Sulfate-K Sulfate-Mg Sulf 17.5-3.13-1.6 GM/177ML SOLN Take 1 kit by mouth once for 1 dose.   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 Carlean Purl, adenomatous polyp, repeat in 3 yrs    [place], URETEROSCOPY AND STENT PLACEMENT Bilateral 02/03/2019   Procedure: [place],  URETEROSCOPY AND STENT PLACEMENT;  Surgeon: Alexis Frock, MD;  [facility];  Service: Urology;  Laterality: Bilateral;  [address] [place] STUDY N/A 08/26/2017   Procedure: GIVENS CAPSULE STUDY;  Surgeon: Gatha Mayer, MD;  Location: [place];  Service: Endoscopy;  Laterality: N/A;   HOLMIUM LASER APPLICATION Bilateral 08/06/3149   Procedure: HOLMIUM LASER APPLICATION;  Surgeon: Alexis Frock, MD;  [facility];  Service: Urology;  Laterality: Bilateral;   INGUINAL HERNIA REPAIR Left 02/21/2016   Procedure: LAPAROSCOPIC INGUINAL HERNIA;  Surgeon: Alexis Frock, MD;  [facility];  Service: Urology;  Laterality: Left;   LYMPHADENECTOMY Bilateral 02/21/2016   Procedure: PELVIC LYMPHADENECTOMY;  Surgeon: Alexis Frock, MD;  [facility];  Service: Urology;  Laterality: Bilateral;   MANDIBLE SURGERY  1990   MESENTERIC ARTERY BYPASS N/A 05/15/2017   Procedure: OPEN FENESTRATION SUPERIOR MESENTERIC ARTERY;  Surgeon: Conrad Boyd, MD;  [facility];  Service: Vascular;  Laterality: N/A;   PATCH ANGIOPLASTY N/A 05/15/2017   Procedure: SUPERIOR MESENTERIC ARTERY PATCH ANGIOPLASTY USING PERI-GUARD PATCH;  Surgeon: Conrad [place], MD;  Location: [place];  Service: Vascular;  Laterality: N/A;   PERCUTANEOUS VENOUS THROMBECTOMY,LYSIS WITH INTRAVASCULAR ULTRASOUND (IVUS) N/A 05/15/2017   Procedure: THROMBECTOMY OF SUPERIOR MESENTERIC ARTERY;  Surgeon: Conrad Fairwood, MD;  Location: [place];  Service: Vascular;  Laterality: N/A;   [place] N/A 02/21/2016   Procedure: XI ROBOTIC ASSISTED LAPAROSCOPIC RADICAL PROSTATECTOMY;  Surgeon: Alexis Frock, MD;  [facility];  Service: Urology;  Laterality: N/A;   UMBILICAL HERNIA REPAIR  02/21/2016   Procedure: LAPAROSCOPIC UMBILICAL HERNIA;  Surgeon: Alexis Frock, MD;  [facility];  Service: Urology;;   [name]  07/29/2017   Procedure: Visceral Angiogram;  Surgeon: Conrad Fel[place] Mills,  MD;  Location: [place] [facility];  Service: Cardiovascular;;   Social History   Social History Narrative   The patient is divorced he has 1 son and 1 daughter He is a Scientist, 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   Review of Systems See HPI otherwise negative review of systems  Objective:   Physical Exam '@BP'  120/74    Pulse 73    Ht '5\' 7"'  (1.702 m)    Wt 205 lb 12.8 oz (93.4 kg)    SpO2 98%    BMI 32.23 kg/m @  General:  NAD Eyes:   anicteric Lungs:  clear Heart::  S1S2 no rubs, murmurs or gallops Abdomen:  soft and nontender, BS+ ventral and umbilical hernia (ventral is postop) Ext:   no edema, cyanosis or clubbing    Data Reviewed:  See HPI

## 2021-07-22 NOTE — Patient Instructions (Addendum)
You can try Beano or GasEx (simethicone) for gas. Also check out Devrom pills.  Another thing some people do is charcoal pads in their underwear (see Amazon)  You should take a multivitamin with B12 and also A,K,D and E in it.  You have been scheduled for a colonoscopy. Please follow written instructions given to you at your visit today.  Please pick up your prep supplies at the pharmacy within the next 1-3 days. If you use inhalers (even only as needed), please bring them with you on the day of your procedure.  I appreciate the opportunity to care for you. Silvano Rusk, MD, [facility]

## 2021-08-27 ENCOUNTER — Encounter: Payer: Self-pay | Admitting: Internal Medicine

## 2021-08-31 ENCOUNTER — Encounter: Payer: Self-pay | Admitting: Internal Medicine

## 2021-09-02 ENCOUNTER — Ambulatory Visit (AMBULATORY_SURGERY_CENTER): Payer: 59 | Admitting: Internal Medicine

## 2021-09-02 ENCOUNTER — Encounter: Payer: Self-pay | Admitting: Internal Medicine

## 2021-09-02 ENCOUNTER — Other Ambulatory Visit: Payer: Self-pay

## 2021-09-02 VITALS — BP 142/98 | HR 68 | Temp 98.7°F | Resp 14 | Ht 67.0 in | Wt 205.0 lb

## 2021-09-02 DIAGNOSIS — D120 Benign neoplasm of cecum: Secondary | ICD-10-CM | POA: Diagnosis not present

## 2021-09-02 DIAGNOSIS — Z86010 Personal history of colonic polyps: Secondary | ICD-10-CM

## 2021-09-02 DIAGNOSIS — D123 Benign neoplasm of transverse colon: Secondary | ICD-10-CM | POA: Diagnosis not present

## 2021-09-02 MED ORDER — SODIUM CHLORIDE 0.9 % IV SOLN: 500.0000 mL | Freq: Once | INTRAVENOUS | Status: DC

## 2021-09-02 NOTE — Progress Notes (Signed)
PT taken to PACU. Monitors in place. VSS. Report given to RN. 

## 2021-09-02 NOTE — Op Note (Signed)
[place] ?Patient Name: Marcus Pruitt ?Procedure Date: 09/02/2021 3:39 PM ?MRN: [id number] ?Endoscopist: Gatha Mayer , MD ?Age: 51 ?Referring MD:  ?Date of Birth: 24-Aug-1970 ?Gender: Male ?Account #: [id number] ?Procedure:                Colonoscopy ?Indications:              Surveillance: Personal history of adenomatous  ?                          polyps on last colonoscopy > 3 years ago ?Medicines:                Propofol per Anesthesia, Monitored Anesthesia Care ?Procedure:                Pre-Anesthesia Assessment: ?                          - Prior to the procedure, a History and Physical  ?                          was performed, and patient medications and  ?                          allergies were reviewed. The patient's tolerance of  ?                          previous anesthesia was also reviewed. The risks  ?                          and benefits of the procedure and the sedation  ?                          options and risks were discussed with the patient.  ?                          All questions were answered, and informed consent  ?                          was obtained. Prior Anticoagulants: The patient has  ?                          taken no previous anticoagulant or antiplatelet  ?                          agents. ASA Grade Assessment: II - A patient with  ?                          mild systemic disease. After reviewing the risks  ?                          and benefits, the patient was deemed in  ?                          satisfactory condition to undergo the procedure. ?  After obtaining informed consent, the colonoscope  ?                          was passed under direct vision. Throughout the  ?                          procedure, the patient's blood pressure, pulse, and  ?                          oxygen saturations were monitored continuously. The  ?                          CF [id number] #[id number] was introduced through the anus  ?                          and  advanced to the the cecum, identified by  ?                          appendiceal orifice and ileocecal valve. The  ?                          colonoscopy was performed without difficulty. The  ?                          patient tolerated the procedure well. The quality  ?                          of the bowel preparation was good. The ileocecal  ?                          valve, appendiceal orifice, and rectum were  ?                          photographed. The bowel preparation used was SUPREP  ?                          via split dose instruction. ?Scope In: 3:56:05 PM ?Scope Out: 4:16:04 PM ?Scope Withdrawal Time: 0 hours 14 minutes 5 seconds  ?Total Procedure Duration: 0 hours 19 minutes 59 seconds  ?Findings:                 The perianal and digital rectal examinations were  ?                          normal. ?                          Two sessile polyps were found in the transverse  ?                          colon and cecum. The polyps were diminutive in  ?                          size. These polyps were removed with a cold snare.  ?  Resection and retrieval were complete. Verification  ?                          of patient identification for the specimen was  ?                          done. Estimated blood loss was minimal. ?                          Multiple diverticula were found in the entire colon. ?                          The exam was otherwise without abnormality on  ?                          direct and retroflexion views. ?Complications:            No immediate complications. ?Estimated Blood Loss:     Estimated blood loss was minimal. ?Impression:               - Two diminutive polyps in the transverse colon and  ?                          in the cecum, removed with a cold snare. Resected  ?                          and retrieved. ?                          - Diverticulosis in the entire examined colon. ?                          - The examination was otherwise normal on  direct  ?                          and retroflexion views. ?                          - Personal history of colonic polyp adenoma 12 mm  ?                          2019. ?Recommendation:           - Patient has a contact number available for  ?                          emergencies. The signs and symptoms of potential  ?                          delayed complications were discussed with the  ?                          patient. Return to normal activities tomorrow.  ?                          Written discharge instructions were provided to  the  ?                          patient. ?                          - Resume previous diet. ?                          - Continue present medications. ?                          - Await pathology results. ?                          - Repeat colonoscopy is recommended. The  ?                          colonoscopy date will be determined after pathology  ?                          results from today's exam become available for  ?                          review. ?Gatha Mayer, MD ?09/02/2021 4:26:14 PM ?This report has been signed electronically. ?

## 2021-09-02 NOTE — Patient Instructions (Addendum)
Please read handouts provided. ?Continue present medications. ?Await pathology results. ? ? ? ?YOU HAD AN ENDOSCOPIC PROCEDURE TODAY AT [facility]:   Refer to the procedure report that was given to you for any specific questions about what was found during the examination.  If the procedure report does not answer your questions, please call your gastroenterologist to clarify.  If you requested that your care partner not be given the details of your procedure findings, then the procedure report has been included in a sealed envelope for you to review at your convenience later. ? ?YOU SHOULD EXPECT: Some feelings of bloating in the abdomen. Passage of more gas than usual.  Walking can help get rid of the air that was put into your GI tract during the procedure and reduce the bloating. If you had a lower endoscopy (such as a colonoscopy or flexible sigmoidoscopy) you may notice spotting of blood in your stool or on the toilet paper. If you underwent a bowel prep for your procedure, you may not have a normal bowel movement for a few days. ? ?Please Note:  You might notice some irritation and congestion in your nose or some drainage.  This is from the oxygen used during your procedure.  There is no need for concern and it should clear up in a day or so. ? ?SYMPTOMS TO REPORT IMMEDIATELY: ? ?Following lower endoscopy (colonoscopy or flexible sigmoidoscopy): ? Excessive amounts of blood in the stool ? Significant tenderness or worsening of abdominal pains ? Swelling of the abdomen that is new, acute ? Fever of 100?F or higher ? ? ?For urgent or emergent issues, a gastroenterologist can be reached at any hour by calling [phone]. ?Do not use MyChart messaging for urgent concerns.  ? ? ?DIET:  We do recommend a small meal at first, but then you may proceed to your regular diet.  Drink plenty of fluids but you should avoid alcoholic beverages for 24 hours. ? ?ACTIVITY:  You should plan to take it easy  for the rest of today and you should NOT DRIVE or use heavy machinery until tomorrow (because of the sedation medicines used during the test).   ? ?FOLLOW UP: ?Our staff will call the number listed on your records 48-72 hours following your procedure to check on you and address any questions or concerns that you may have regarding the information given to you following your procedure. If we do not reach you, we will leave a message.  We will attempt to reach you two times.  During this call, we will ask if you have developed any symptoms of COVID 19. If you develop any symptoms (ie: fever, flu-like symptoms, shortness of breath, cough etc.) before then, please call [phone].  If you test positive for Covid 19 in the 2 weeks post procedure, please call and report this information to [place].   ? ?If any biopsies were taken you will be contacted by phone or by letter within the next 1-3 weeks.  Please call us at [phone] if you have not heard about the biopsies in 3 weeks.  ? ? ?SIGNATURES/CONFIDENTIALITY: ?You and/or your care partner have signed paperwork which will be entered into your electronic medical record.  These signatures attest to the fact that that the information above on your After Visit Summary has been reviewed and is understood.  Full responsibility of the confidentiality of this discharge information lies with you and/or your care-partner. I found and removed 2 tiny  polyps. ?I will let you know pathology results and when to have another routine colonoscopy by mail and/or My Chart. ? ?I appreciate the opportunity to care for you. ?Gatha Mayer, MD, [facility] ? ? ?

## 2021-09-02 NOTE — Progress Notes (Signed)
Called to room to assist during endoscopic procedure.  Patient ID and intended procedure confirmed with present staff. Received instructions for my participation in the procedure from the performing physician.  

## 2021-09-02 NOTE — Progress Notes (Signed)
[facility] History and Physical ? ? ?Primary Care Physician:  Laurey Morale, MD ? ? ?Reason for Procedure:   Hx adenomatous polyp and diarrhea ? ?Plan:    colonoscopy ? ? ? ? ?HPI: Marcus Pruitt is a 51 y.o. male here for hx colon polyps, diarrhea. ? ? ?Past Medical History:  ?Diagnosis Date  ? Allergy   ? Blood transfusion without reported diagnosis   ? Clotting disorder ([place])   ? RESOLVED NOW PER PT  ? H/O blood clots   ? mesenteric  ? Hearing loss in right ear   ? History of colon polyps   ? Hypertension   ? IBS (irritable bowel syndrome)   ? with diarrhea  ? Low back pain   ? Prostate cancer ([place])   ? Sleep apnea   ? on CPAP  ? Small intestinal bacterial overgrowth 09/22/2017  ? + Breath test 08/2017  ? 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 Tinnitus   ? RIGHT EAR  ? ? ?Past Surgical History:  ?Procedure Laterality Date  ? ABDOMINAL WOUND DEHISCENCE N/A 05/20/2017  ? Procedure: REPAIR OF ABDOMINAL WOUND DEHISCENCE AND EVACUATION OF HEMATOMA;  Surgeon: Angelia Mould, MD;  Location: [place];  Service: Vascular;  Laterality: N/A;  ? AORTOGRAM N/A 05/15/2017  ? Procedure: MESENTERIC AORTOGRAM;  Surgeon: Conrad [place], MD;  Location: [place];  Service: Vascular;  Laterality: N/A;  ? COLONOSCOPY  10/14/2017  ? per Dr. Carlean Purl, adenomatous polyp, repeat in 3 yrs   ? CYSTOSCOPY WITH RETROGRADE PYELOGRAM, URETEROSCOPY AND STENT PLACEMENT Bilateral 02/03/2019  ? Procedure: CYSTOSCOPY WITH RETROGRADE PYELOGRAM, URETEROSCOPY AND STENT PLACEMENT;  Surgeon: Alexis Frock, MD;  [facility];  Service: Urology;  Laterality: Bilateral;  75 [place]  ? GIVENS CAPSULE STUDY N/A 08/26/2017  ? Procedure: GIVENS CAPSULE STUDY;  Surgeon: Gatha Mayer, MD;  Location: [facility];  Service: Endoscopy;  Laterality: N/A;  ? HOLMIUM LASER APPLICATION Bilateral 08/02/2681  ? Procedure: HOLMIUM LASER APPLICATION;  Surgeon: Alexis Frock, MD;  [facility];  Service: Urology;  Laterality:  Bilateral;  ? INGUINAL HERNIA REPAIR Left 02/21/2016  ? Procedure: LAPAROSCOPIC INGUINAL HERNIA;  Surgeon: Alexis Frock, MD;  [facility];  Service: Urology;  Laterality: Left;  ? LYMPHADENECTOMY Bilateral 02/21/2016  ? Procedure: PELVIC LYMPHADENECTOMY;  Surgeon: Alexis Frock, MD;  [facility];  Service: Urology;  Laterality: Bilateral;  ? [place]  ? MESENTERIC ARTERY BYPASS N/A 05/15/2017  ? Procedure: OPEN FENESTRATION SUPERIOR MESENTERIC ARTERY;  Surgeon: Conrad Rogers, MD;  Location: [place];  Service: Vascular;  Laterality: N/A;  ? PATCH ANGIOPLASTY N/A 05/15/2017  ? Procedure: SUPERIOR MESENTERIC ARTERY PATCH ANGIOPLASTY USING PERI-GUARD PATCH;  Surgeon: Conrad [place], MD;  Location: [place];  Service: Vascular;  Laterality: N/A;  ? PERCUTANEOUS VENOUS THROMBECTOMY,LYSIS WITH INTRAVASCULAR ULTRASOUND (IVUS) N/A 05/15/2017  ? Procedure: THROMBECTOMY OF SUPERIOR MESENTERIC ARTERY;  Surgeon: Conrad [place], MD;  Location: [place];  Service: Vascular;  Laterality: N/A;  ? ROBOT ASSISTED LAPAROSCOPIC RADICAL PROSTATECTOMY N/A 02/21/2016  ? Procedure: XI ROBOTIC ASSISTED LAPAROSCOPIC RADICAL PROSTATECTOMY;  Surgeon: Alexis Frock, MD;  [facility];  Service: Urology;  Laterality: N/A;  ? UMBILICAL HERNIA REPAIR  02/21/2016  ? Procedure: LAPAROSCOPIC UMBILICAL HERNIA;  Surgeon: Alexis Frock, MD;  [facility];  Service: Urology;;  ? VISCERAL ANGIOGRAM  07/29/2017  ? Procedure: Visceral Angiogram;  Surgeon: Conrad Pixley, MD;  Location: [place] [facility];  Service: Cardiovascular;;  ? ? ?Prior to Admission medications   ?Medication Sig Start Date End Date Taking? Authorizing Provider  ?amLODipine (NORVASC) 5 MG tablet Take 1 tablet (5 mg total) by mouth daily. 04/23/21  Yes Laurey Morale, MD  ?cholestyramine light (PREVALITE) 4 g packet MIX AND DRINK 1 PACKET(4 GRAMS) BY MOUTH TWICE DAILY 05/20/21  Yes Gatha Mayer, MD  ?buPROPion (WELLBUTRIN XL) 150 MG 24 hr tablet Take 300 mg by  mouth daily.    [provider]  ?EPINEPHrine (EPIPEN 2-PAK) 0.3 mg/0.3 mL IJ SOAJ injection Inject 0.3 mLs (0.3 mg total) into the muscle as needed for anaphylaxis. 03/06/20   Laurey Morale, MD  ? ? ?Current Outpatient Medications  ?Medication Sig Dispense Refill  ? amLODipine (NORVASC) 5 MG tablet Take 1 tablet (5 mg total) by mouth daily. 90 tablet 3  ? cholestyramine light (PREVALITE) 4 g packet MIX AND DRINK 1 PACKET(4 GRAMS) BY MOUTH TWICE DAILY 60 each 0  ? buPROPion (WELLBUTRIN XL) 150 MG 24 hr tablet Take 300 mg by mouth daily.    ? EPINEPHrine (EPIPEN 2-PAK) 0.3 mg/0.3 mL IJ SOAJ injection Inject 0.3 mLs (0.3 mg total) into the muscle as needed for anaphylaxis. 1 each 2  ? ?Current Facility-Administered Medications  ?Medication Dose Route Frequency Provider Last Rate Last Admin  ? 0.9 %  sodium chloride infusion  500 mL Intravenous Once Gatha Mayer, MD      ? ? ?Allergies as of 09/02/2021 - Review Complete 09/02/2021  ?Allergen Reaction Noted  ? Bee venom Anaphylaxis 07/22/2021  ? ? ?Family History  ?Problem Relation Age of Onset  ? Heart disease Maternal Grandmother   ? Diabetes Other   ?     fhx  ? Prostate cancer Father   ? Hypertension Mother   ? Stroke Mother   ? Colon cancer Neg Hx   ? Colon polyps Neg Hx   ? Esophageal cancer Neg Hx   ? Rectal cancer Neg Hx   ? Stomach cancer Neg Hx   ? ? ?Social History  ? ?Socioeconomic History  ? Marital status: Single  ?  Spouse name: Not on file  ? Number of children: 2  ? Years of education: Not on file  ? Highest education level: Not on file  ?Occupational History  ? Occupation: Information systems manager  ?Tobacco Use  ? Smoking status: Former  ?  Types: Cigarettes  ?  Quit date: 01/28/2007  ?  Years since quitting: 14.6  ? Smokeless tobacco: Never  ?Vaping Use  ? Vaping Use: Never used  ?Substance and Sexual Activity  ? Alcohol use: Yes  ?  Alcohol/week: 0.0 standard drinks  ?  Comment: occasional BEER  ? Drug use: No  ? Sexual activity: Yes  ?Other Topics  Concern  ? Not on file  ?Social History Narrative  ? 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 No alcohol tobacco or drugs former smoker  ? ? ?Review of Systems: ? ? other review of systems negative except as mentioned in the HPI. ? ?Physical Exam: ?Vital signs ?BP 139/77   Pulse 72   Temp 98.7 ?F (37.1 ?C)   Ht '5\' 7"'$  (1.702 m)   Wt 205 lb (93  kg)   SpO2 97%   BMI 32.11 kg/m?  ? ?General:   Alert,  Well-developed, well-nourished, pleasant and cooperative in NAD ?Lungs:  Clear throughout to auscultation.   ?Heart:  Regular rate and rhythm; no murmurs, clicks, rubs,  or gallops. ?Abdomen:  Soft, nontender and nondistended. Normal bowel sounds.   ?Neuro/Psych:  Alert and cooperative. Normal mood and affect. A and O x 3 ? ? ?'@Logyn Kendrick'$  Simonne Maffucci, MD, [facility] ?[facility] ?[phone] (pager) ?09/02/2021 3:48 PM@ ? ?

## 2021-09-04 ENCOUNTER — Telehealth: Payer: Self-pay

## 2021-09-04 NOTE — Telephone Encounter (Signed)
?  Follow up Call- ? ?Call back number 09/02/2021  ?Post procedure Call Back phone  # [phone]  ?Permission to leave phone message Yes  ?Some recent data might be hidden  ?  ? ?Patient questions: ? ?Do you have a fever, pain , or abdominal swelling? No. ?Pain Score  0 * ? ?Have you tolerated food without any problems? Yes.   ? ?Have you been able to return to your normal activities? Yes.   ? ?Do you have any questions about your discharge instructions: ?Diet   No. ?Medications  No. ?Follow up visit  No. ? ?Do you have questions or concerns about your Care? No. ? ?Actions: ?* If pain score is 4 or above: ?No action needed, pain <4. ? ?Have you developed a fever since your procedure? no ? ?2.   Have you had an respiratory symptoms (SOB or cough) since your procedure? no ? ?3.   Have you tested positive for COVID 19 since your procedure no ? ?4.   Have you had any family members/close contacts diagnosed with the COVID 19 since your procedure?  no ? ? ?If yes to any of these questions please route to Joylene John, RN and Joella Prince, RN  ? ? ?

## 2021-09-09 ENCOUNTER — Encounter: Payer: Self-pay | Admitting: Internal Medicine

## 2021-09-25 ENCOUNTER — Telehealth: Payer: Self-pay

## 2021-09-25 NOTE — Telephone Encounter (Signed)
---  Caller states that he has been having pain under his ?rib on the right side and he is concerned it could be ?kidney pain. Description not on lower back - flank ?pain. It is under his arm on the side not on the back. ?States he has had this pain for 2 days. States he has ?history of kidney stones and is concerned that is what ?it is. States he feels it when he bends over to the right. ?No fever ? ?09/24/2021 2:38:12 PM Libby, RN, [facility] ? ?Care Advice Given Per Guideline ?HOME CARE: * You should be able to treat this at home. REASSURANCE AND EDUCATION - FLEETING CHEST PAINS: * ?Fleeting chest pains that last only a few seconds and then go away are usually not serious. * They may be from the muscles, nerves, ?or joints of your chest wall. PAIN MEDICINES: * For pain relief, you can take either acetaminophen, ibuprofen, or naproxen. * ?These mild chest pains usually disappear within 3 days. EXPECTED COURSE: CARE ADVICE given per Chest Pain (Adult) ?guideline. * You become worse CALL BACK IF: * Chest pain increases in frequency, duration or severity * Fever over 100.4 F ?(38.0 C) ? ?Comments ?User: Hamilton Capri, RN Date/Time Eilene Ghazi Time): 09/24/2021 2:39:43 PM ?Caller states he can make the pain go away by stretching and applying heat to the area. Advised to monitor and ?call back if symptoms come back, change or get worse. States he is urinating like normal, no issues there. ?

## 2021-09-30 ENCOUNTER — Ambulatory Visit: Payer: 59 | Admitting: Family Medicine

## 2021-09-30 VITALS — BP 140/95 | HR 68 | Temp 98.2°F | Wt 205.7 lb

## 2021-09-30 DIAGNOSIS — R109 Unspecified abdominal pain: Secondary | ICD-10-CM

## 2021-09-30 LAB — POCT URINALYSIS DIPSTICK
Bilirubin, UA: NEGATIVE
Blood, UA: NEGATIVE
Glucose, UA: NEGATIVE
Ketones, UA: NEGATIVE
Leukocytes, UA: NEGATIVE
Nitrite, UA: NEGATIVE
Protein, UA: POSITIVE — AB
Spec Grav, UA: >=1.03 — AB (ref 1.010–1.025)
Urobilinogen, UA: NEGATIVE E.U./dL — AB
pH, UA: 6 (ref 5.0–8.0)

## 2021-09-30 MED ORDER — HYDROCODONE-ACETAMINOPHEN 5-325 MG PO TABS: 1.0000 | ORAL_TABLET | ORAL | 0 refills | Status: DC | PRN

## 2021-09-30 NOTE — Patient Instructions (Signed)
May use topical lidocaine patch ? ?Let me know if not improving over the next couple of weeks.   ?

## 2021-09-30 NOTE — Progress Notes (Signed)
? ?Established Patient Office Visit ? ?Subjective:  ?Patient ID: Marcus Pruitt, male    DOB: 07/23/1970  Age: 51 y.o. MRN: [id number] ? ?CC:  ?Chief Complaint  ?Patient presents with  ? Pain  ?  Pain in rt ribs since 3/27. Sharp pain, hurts when moves or bends to the right side. Took tylenol, advil, heat, flexiril nothing has completely taken pain away. Hx of stones but thinks if he had any they would have passed   ? ? ?HPI ?Marcus Pruitt presents for onset around 09/22/2021 of intermittent sharp pain right lateral lower rib cage area.  Does not recall any injury.  His job does require fair amount of lifting.  He is tried some heat, ibuprofen, and Biofreeze without much improvement.  He does have history of kidney stones but this pain is much higher.  Denies any dysuria.  No fevers or chills.  No cough.  No dyspnea.  No significant pleuritic pain.  Pain worse when bending trunk toward the right side but not to the left.  No rashes.  No visible swelling. ? ?He does have a history of prostate cancer 2016 but gets follow-up PSA yearly and undetectable PSA.  He has past history of superior mesenteric artery thrombosis ? ?Past Medical History:  ?Diagnosis Date  ? Allergy   ? Blood transfusion without reported diagnosis   ? Clotting disorder ([place])   ? RESOLVED NOW PER PT  ? H/O blood clots   ? mesenteric  ? Hearing loss in right ear   ? History of colon polyps   ? Hypertension   ? IBS (irritable bowel syndrome)   ? with diarrhea  ? Low back pain   ? Prostate cancer ([place])   ? Sleep apnea   ? on CPAP  ? Small intestinal bacterial overgrowth 09/22/2017  ? + Breath test 08/2017  ? 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 Tinnitus   ? RIGHT EAR  ? ? ?Past Surgical History:  ?Procedure Laterality Date  ? ABDOMINAL WOUND DEHISCENCE N/A 05/20/2017  ? Procedure: REPAIR OF ABDOMINAL WOUND DEHISCENCE AND EVACUATION OF HEMATOMA;  Surgeon: Angelia Mould, MD;  Location: [facility];  Service: Vascular;   Laterality: N/A;  ? AORTOGRAM N/A 05/15/2017  ? Procedure: MESENTERIC AORTOGRAM;  Surgeon: Conrad Griffith, MD;  Location: [facility];  Service: Vascular;  Laterality: N/A;  ? COLONOSCOPY  10/14/2017  ? per Dr. Carlean Purl, adenomatous polyp, repeat in 3 yrs   ? CYSTOSCOPY WITH RETROGRADE PYELOGRAM, URETEROSCOPY AND STENT PLACEMENT Bilateral 02/03/2019  ? Procedure: CYSTOSCOPY WITH RETROGRADE PYELOGRAM, URETEROSCOPY AND STENT PLACEMENT;  Surgeon: Alexis Frock, MD;  Location: [facility];  Service: Urology;  Laterality: Bilateral;  75 MINS  ? GIVENS CAPSULE STUDY N/A 08/26/2017  ? Procedure: GIVENS CAPSULE STUDY;  Surgeon: Gatha Mayer, MD;  Location: [facility];  Service: Endoscopy;  Laterality: N/A;  ? HOLMIUM LASER APPLICATION Bilateral 03/01/9029  ? Procedure: HOLMIUM LASER APPLICATION;  Surgeon: Alexis Frock, MD;  Location: [facility];  Service: Urology;  Laterality: Bilateral;  ? INGUINAL HERNIA REPAIR Left 02/21/2016  ? Procedure: LAPAROSCOPIC INGUINAL HERNIA;  Surgeon: Alexis Frock, MD;  Location: [facility];  Service: Urology;  Laterality: Left;  ? LYMPHADENECTOMY Bilateral 02/21/2016  ? Procedure: PELVIC LYMPHADENECTOMY;  Surgeon: Alexis Frock, MD;  Location: [facility];  Service: Urology;  Laterality: Bilateral;  ? [place]  ? MESENTERIC ARTERY BYPASS N/A 05/15/2017  ? Procedure: OPEN FENESTRATION SUPERIOR MESENTERIC ARTERY;  Surgeon: Adele Barthel  L, MD;  Location: [place];  Service: Vascular;  Laterality: N/A;  ? PATCH ANGIOPLASTY N/A 05/15/2017  ? Procedure: SUPERIOR MESENTERIC ARTERY PATCH ANGIOPLASTY USING PERI-GUARD PATCH;  Surgeon: Conrad [place], MD;  Location: [place];  Service: Vascular;  Laterality: N/A;  ? PERCUTANEOUS VENOUS THROMBECTOMY,LYSIS WITH INTRAVASCULAR ULTRASOUND (IVUS) N/A 05/15/2017  ? Procedure: THROMBECTOMY OF SUPERIOR MESENTERIC ARTERY;  Surgeon: Conrad Sabina, MD;  Location: [facility];  Service: Vascular;  Laterality: N/A;  ? ROBOT ASSISTED LAPAROSCOPIC RADICAL PROSTATECTOMY N/A 02/21/2016  ?  Procedure: XI ROBOTIC ASSISTED LAPAROSCOPIC RADICAL PROSTATECTOMY;  Surgeon: Alexis Frock, MD;  Location: [facility];  Service: Urology;  Laterality: N/A;  ? UMBILICAL HERNIA REPAIR  02/21/2016  ? Procedure: LAPAROSCOPIC UMBILICAL HERNIA;  Surgeon: Alexis Frock, MD;  Location: [facility];  Service: Urology;;  ? VISCERAL ANGIOGRAM  07/29/2017  ? Procedure: Visceral Angiogram;  Surgeon: Conrad [place], MD;  Location: [facility];  Service: Cardiovascular;;  ? ? ?Family History  ?Problem Relation Age of Onset  ? Heart disease Maternal Grandmother   ? Diabetes Other   ?     fhx  ? Prostate cancer Father   ? Hypertension Mother   ? Stroke Mother   ? Colon cancer Neg Hx   ? Colon polyps Neg Hx   ? Esophageal cancer Neg Hx   ? Rectal cancer Neg Hx   ? Stomach cancer Neg Hx   ? ? ?Social History  ? ?Socioeconomic History  ? Marital status: Single  ?  Spouse name: Not on file  ? Number of children: 2  ? Years of education: Not on file  ? Highest education level: Not on file  ?Occupational History  ? Occupation: Information systems manager  ?Tobacco Use  ? Smoking status: Former  ?  Types: Cigarettes  ?  Quit date: 01/28/2007  ?  Years since quitting: 14.6  ? Smokeless tobacco: Never  ?Vaping Use  ? Vaping Use: Never used  ?Substance and Sexual Activity  ? Alcohol use: Yes  ?  Alcohol/week: 0.0 standard drinks  ?  Comment: occasional BEER  ? Drug use: No  ? Sexual activity: Yes  ?Other Topics Concern  ? Not on file  ?Social History Narrative  ? 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 No alcohol tobacco or drugs former smoker  ? ?Social Determinants of Health  ? ?Financial Resource Strain: Not on file  ?Food Insecurity: Not on file  ?Transportation Needs: Not on file  ?Physical Activity: Not on file  ?Stress: Not on file   ?Social Connections: Not on file  ?Intimate Partner Violence: Not on file  ? ? ?Outpatient Medications Prior to Visit  ?Medication Sig Dispense Refill  ? amLODipine (NORVASC) 5 MG tablet Take 1 tablet (5 mg total) by mouth daily. 90 tablet 3  ? buPROPion (WELLBUTRIN XL) 150 MG 24 hr tablet Take 300 mg by mouth daily.    ? cholestyramine light (PREVALITE) 4 g packet MIX AND DRINK 1 PACKET(4 GRAMS) BY MOUTH TWICE DAILY 60 each 0  ? EPINEPHrine (EPIPEN 2-PAK) 0.3 mg/0.3 mL IJ SOAJ injection Inject 0.3 mLs (0.3 mg total) into the muscle as needed for anaphylaxis. 1 each 2  ? ?No facility-administered medications prior to visit.  ? ? ?  Allergies  ?Allergen Reactions  ? Bee Venom Anaphylaxis  ?  SOB, , hand swelled up after getting stung on her hand  ? ? ?ROS ?Review of Systems  ?Constitutional:  Negative for appetite change, chills, fever and unexpected weight change.  ?Respiratory:  Negative for cough and shortness of breath.   ?Cardiovascular:  Negative for chest pain.  ?Gastrointestinal:  Negative for abdominal pain.  ?Genitourinary:  Negative for dysuria.  ?Skin:  Negative for rash.  ? ?  ?Objective:  ?  ?Physical Exam ?Vitals reviewed.  ?Constitutional:   ?   Appearance: Normal appearance.  ?Cardiovascular:  ?   Rate and Rhythm: Normal rate and regular rhythm.  ?Pulmonary:  ?   Effort: Pulmonary effort is normal.  ?   Breath sounds: Normal breath sounds. No wheezing or rales.  ?Musculoskeletal:  ?   Comments: Has some tenderness right lower lateral thoracic rib cage area.  This is localized around the eighth rib.  No visible swelling.  No erythema.  No ecchymosis.  No rash.  ?Skin: ?   Findings: No rash.  ?Neurological:  ?   Mental Status: He is alert.  ? ? ?BP (!) 140/95 (BP Location: Right Arm, Patient Position: Sitting, Cuff Size: Normal)   Pulse 68   Temp 98.2 ?F (36.8 ?C) (Oral)   Wt 205 lb 11.2 oz (93.3 kg)   SpO2 99%   BMI 32.22 kg/m?  ?Wt Readings from Last 3 Encounters:  ?09/30/21 205 lb 11.2 oz (93.3  kg)  ?09/02/21 205 lb (93 kg)  ?07/22/21 205 lb 12.8 oz (93.4 kg)  ? ? ? ?Health Maintenance Due  ?Topic Date Due  ? Zoster Vaccines- Shingrix (1 of 2) Never done  ? COVID-19 Vaccine (3 - Moderna risk series) 04/30/2020

## 2021-10-06 ENCOUNTER — Encounter: Payer: Self-pay | Admitting: Family Medicine

## 2021-10-07 NOTE — Telephone Encounter (Signed)
Protein was present in the urine, this is not specific but given his hx of HTN, it is important to follow. His PCP may repeat urine during his next follow up appt. If he does not have one yet, please arrange one. ?Thanks, ?BJ ?

## 2021-10-08 ENCOUNTER — Encounter: Payer: Self-pay | Admitting: Family Medicine

## 2021-10-08 ENCOUNTER — Ambulatory Visit: Payer: 59 | Admitting: Family Medicine

## 2021-10-08 VITALS — BP 122/80 | HR 81 | Temp 97.7°F | Wt 209.1 lb

## 2021-10-08 DIAGNOSIS — R0781 Pleurodynia: Secondary | ICD-10-CM | POA: Diagnosis not present

## 2021-10-08 NOTE — Progress Notes (Signed)
? ?  Subjective:  ? ? Patient ID: Marcus Pruitt, male    DOB: 02/21/1971, 51 y.o.   MRN: [id number] ? ?HPI ?Here for a sharp pain in the right side that he woke up with 2 weeks ago. No recent trauma. The pain is worse when he twists his trunk certain ways. No SOB. He has tried Advil and Aleve but these only help a little.  ? ? ?Review of Systems  ?Constitutional: Negative.   ?Respiratory: Negative.    ?Cardiovascular:  Positive for chest pain. Negative for palpitations and leg swelling.  ? ?   ?Objective:  ? Physical Exam ?Constitutional:   ?   General: He is not in acute distress. ?   Appearance: Normal appearance.  ?Cardiovascular:  ?   Rate and Rhythm: Normal rate and regular rhythm.  ?   Pulses: Normal pulses.  ?   Heart sounds: Normal heart sounds.  ?Pulmonary:  ?   Effort: Pulmonary effort is normal.  ?   Breath sounds: Normal breath sounds.  ?   Comments: Mildly tender at the inferior border of the right lateral ribs  ?Neurological:  ?   Mental Status: He is alert.  ? ? ? ? ? ?   ?Assessment & Plan:  ?Rib pain, possibly from an intercostal strain. He will apply Voltaren 1% gel QID. Recheck if not better in another week.  ?Alysia Penna, MD ? ? ?

## 2021-10-09 ENCOUNTER — Ambulatory Visit: Payer: 59 | Admitting: Family Medicine

## 2021-10-22 ENCOUNTER — Other Ambulatory Visit: Payer: Self-pay | Admitting: Orthopedic Surgery

## 2021-10-22 DIAGNOSIS — R0781 Pleurodynia: Secondary | ICD-10-CM

## 2021-10-23 ENCOUNTER — Encounter: Payer: Self-pay | Admitting: Family Medicine

## 2021-10-23 ENCOUNTER — Emergency Department (HOSPITAL_BASED_OUTPATIENT_CLINIC_OR_DEPARTMENT_OTHER)
Admission: EM | Admit: 2021-10-23 | Discharge: 2021-10-23 | Disposition: A | Discharge status: Home / Self Care | Payer: 59 | Attending: Emergency Medicine | Admitting: Emergency Medicine

## 2021-10-23 ENCOUNTER — Other Ambulatory Visit: Payer: Self-pay

## 2021-10-23 ENCOUNTER — Emergency Department (HOSPITAL_BASED_OUTPATIENT_CLINIC_OR_DEPARTMENT_OTHER): Payer: 59

## 2021-10-23 ENCOUNTER — Encounter (HOSPITAL_BASED_OUTPATIENT_CLINIC_OR_DEPARTMENT_OTHER): Payer: Self-pay

## 2021-10-23 ENCOUNTER — Other Ambulatory Visit: Payer: 59

## 2021-10-23 DIAGNOSIS — R0781 Pleurodynia: Secondary | ICD-10-CM | POA: Diagnosis present

## 2021-10-23 DIAGNOSIS — R0789 Other chest pain: Secondary | ICD-10-CM

## 2021-10-23 LAB — CBC WITH DIFFERENTIAL/PLATELET
Abs Immature Granulocytes: 0.03 10*3/uL (ref 0.00–0.07)
Basophils Absolute: 0 10*3/uL (ref 0.0–0.1)
Basophils Relative: 1 %
Eosinophils Absolute: 0.1 10*3/uL (ref 0.0–0.5)
Eosinophils Relative: 2 %
HCT: 45.7 % (ref 39.0–52.0)
Hemoglobin: 15.3 g/dL (ref 13.0–17.0)
Immature Granulocytes: 1 %
Lymphocytes Relative: 23 %
Lymphs Abs: 1.4 10*3/uL (ref 0.7–4.0)
MCH: 28.3 pg (ref 26.0–34.0)
MCHC: 33.5 g/dL (ref 30.0–36.0)
MCV: 84.6 fL (ref 80.0–100.0)
Monocytes Absolute: 0.5 10*3/uL (ref 0.1–1.0)
Monocytes Relative: 8 %
Neutro Abs: 4.1 10*3/uL (ref 1.7–7.7)
Neutrophils Relative %: 65 %
Platelets: 249 10*3/uL (ref 150–400)
RBC: 5.4 MIL/uL (ref 4.22–5.81)
RDW: 15.4 % (ref 11.5–15.5)
WBC: 6.2 10*3/uL (ref 4.0–10.5)
nRBC: 0 % (ref 0.0–0.2)

## 2021-10-23 LAB — BASIC METABOLIC PANEL
Anion gap: 7 (ref 5–15)
BUN: 16 mg/dL (ref 6–20)
CO2: 26 mmol/L (ref 22–32)
Calcium: 8.9 mg/dL (ref 8.9–10.3)
Chloride: 105 mmol/L (ref 98–111)
Creatinine, Ser: 1.11 mg/dL (ref 0.61–1.24)
GFR, Estimated: >60 mL/min (ref >60)
Glucose, Bld: 90 mg/dL (ref 70–99)
Potassium: 4.1 mmol/L (ref 3.5–5.1)
Sodium: 138 mmol/L (ref 135–145)

## 2021-10-23 LAB — D-DIMER, QUANTITATIVE: D-Dimer, Quant: <0.27 ug/mL-FEU (ref 0.00–0.50)

## 2021-10-23 MED ORDER — CYCLOBENZAPRINE HCL 10 MG PO TABS: 10.0000 mg | ORAL_TABLET | Freq: Two times a day (BID) | ORAL | 0 refills | Status: DC | PRN

## 2021-10-23 MED ORDER — KETOROLAC TROMETHAMINE 30 MG/ML IJ SOLN
30.0000 mg | Freq: Once | INTRAMUSCULAR | Status: AC
  Administered 2021-10-23: 30 mg via INTRAVENOUS
  Filled 2021-10-23: qty 1

## 2021-10-23 MED ORDER — OXYCODONE-ACETAMINOPHEN 5-325 MG PO TABS: 1.0000 | ORAL_TABLET | Freq: Four times a day (QID) | ORAL | 0 refills | Status: DC | PRN

## 2021-10-23 MED ORDER — ETODOLAC 300 MG PO CAPS: 300.0000 mg | ORAL_CAPSULE | Freq: Three times a day (TID) | ORAL | 0 refills | Status: DC

## 2021-10-23 NOTE — ED Triage Notes (Signed)
Pt c/o R lateral rib pain x 1 month. Pt has had multiple visits to [facility] and has been diagnosed with costochondritis. Pt was Rx prednisone and hydrocodone. Pt states pain is not getting any better.  ?

## 2021-10-23 NOTE — ED Notes (Signed)
Patient transported to X-ray 

## 2021-10-23 NOTE — ED Notes (Signed)
Pt A&OX4 ambulatory at d/c with independent steady gait. Pt verbalized understanding of d/c instructions, prescriptions and follow up care. 

## 2021-10-23 NOTE — ED Provider Notes (Signed)
?[facility] EMERGENCY DEPARTMENT ?Provider Note ? ? ?CSN: [id number] ?Arrival date & time: 10/23/21  2108 ? ?  ? ?History ? ?Chief Complaint  ?Patient presents with  ? Rib Injury  ? ? ?Marcus Pruitt is a 51 y.o. male. ? ?HPI ? ?Patient has history of hypertension who presents to the ED with complaints of 1 month of right-sided rib pain.  Pain increases with palpation.  It is sharp in nature.  Feels better with some compression.  Patient denies any cough or shortness of breath.  No fevers or chills.  No abdominal pain.  No vomiting or diarrhea.  Patient has been seen by [facility] as well as his primary care doctor.  He was diagnosed with costochondritis.  Patient was instructed to follow-up if it was not improving but tonight it was bothering him more so he came to the ED.  He has been taking hydrocodone without much relief ? ?Home Medications ?Prior to Admission medications   ?Medication Sig Start Date End Date Taking? Authorizing Provider  ?cyclobenzaprine (FLEXERIL) 10 MG tablet Take 1 tablet (10 mg total) by mouth 2 (two) times daily as needed for muscle spasms. 10/23/21  Yes Dorie Rank, MD  ?etodolac (LODINE) 300 MG capsule Take 1 capsule (300 mg total) by mouth every 8 (eight) hours. 10/23/21  Yes Dorie Rank, MD  ?oxyCODONE-acetaminophen (PERCOCET/ROXICET) 5-325 MG tablet Take 1 tablet by mouth every 6 (six) hours as needed for severe pain. 10/23/21  Yes Dorie Rank, MD  ?amLODipine (NORVASC) 5 MG tablet Take 1 tablet (5 mg total) by mouth daily. 04/23/21   Laurey Morale, MD  ?buPROPion (WELLBUTRIN XL) 150 MG 24 hr tablet Take 300 mg by mouth daily.    [provider]  ?cholestyramine light (PREVALITE) 4 g packet MIX AND DRINK 1 PACKET(4 GRAMS) BY MOUTH TWICE DAILY 05/20/21   Gatha Mayer, MD  ?EPINEPHrine (EPIPEN 2-PAK) 0.3 mg/0.3 mL IJ SOAJ injection Inject 0.3 mLs (0.3 mg total) into the muscle as needed for anaphylaxis. 03/06/20   Laurey Morale, MD  ?HYDROcodone-acetaminophen  (NORCO/VICODIN) 5-325 MG tablet Take 1 tablet by mouth every 4 (four) hours as needed for moderate pain. 09/30/21   Burchette, Alinda Sierras, MD  ?   ? ?Allergies    ?Bee venom   ? ?Review of Systems   ?Review of Systems  ?Constitutional:  Negative for fever.  ? ?Physical Exam ?Updated Vital Signs ?BP (!) 128/107   Pulse 73   Temp 98.1 ?F (36.7 ?C) (Oral)   Resp 17   Ht 1.702 m ('5\' 7"'$ )   Wt 90.7 kg   SpO2 95%   BMI 31.32 kg/m?  ?Physical Exam ?Vitals and nursing note reviewed.  ?Constitutional:   ?   General: He is not in acute distress. ?   Appearance: He is well-developed.  ?HENT:  ?   Head: Normocephalic and atraumatic.  ?   Right Ear: External ear normal.  ?   Left Ear: External ear normal.  ?Eyes:  ?   General: No scleral icterus.    ?   Right eye: No discharge.     ?   Left eye: No discharge.  ?   Conjunctiva/sclera: Conjunctivae normal.  ?Neck:  ?   Trachea: No tracheal deviation.  ?Cardiovascular:  ?   Rate and Rhythm: Normal rate and regular rhythm.  ?Pulmonary:  ?   Effort: Pulmonary effort is normal. No respiratory distress.  ?   Breath sounds: Normal breath sounds. No  stridor. No wheezing or rales.  ?Chest:  ?   Chest wall: Tenderness present.  ?   Comments: No rash noted on the chest wall ?Abdominal:  ?   General: Bowel sounds are normal. There is no distension.  ?   Palpations: Abdomen is soft.  ?   Tenderness: There is no abdominal tenderness. There is no guarding or rebound.  ?Musculoskeletal:     ?   General: No tenderness or deformity.  ?   Cervical back: Neck supple.  ?Skin: ?   General: Skin is warm and dry.  ?   Findings: No rash.  ?Neurological:  ?   General: No focal deficit present.  ?   Mental Status: He is alert.  ?   Cranial Nerves: No cranial nerve deficit (no facial droop, extraocular movements intact, no slurred speech).  ?   Sensory: No sensory deficit.  ?   Motor: No abnormal muscle tone or seizure activity.  ?   Coordination: Coordination normal.  ?Psychiatric:     ?   Mood and  Affect: Mood normal.  ? ? ?ED Results / Procedures / Treatments   ?Labs ?(all labs ordered are listed, but only abnormal results are displayed) ?Labs Reviewed  ?CBC WITH DIFFERENTIAL/PLATELET  ?BASIC METABOLIC PANEL  ?D-DIMER, QUANTITATIVE  ? ? ?EKG ?EKG Interpretation ? ?Date/Time:  Thursday October 23 2021 21:30:31 EDT ?Ventricular Rate:  68 ?PR Interval:  193 ?QRS Duration: 101 ?QT Interval:  379 ?QTC Calculation: 403 ?R Axis:   -34 ?Text Interpretation: Sinus rhythm Abnormal R-wave progression, early transition Left ventricular hypertrophy No significant change since last tracing Confirmed by Dorie Rank [phone]) on 10/23/2021 9:42:13 PM ? ?Radiology ?DG Chest 2 View ? ?Result Date: 10/23/2021 ?CLINICAL DATA:  Right-sided rib pain. EXAM: CHEST - 2 VIEW COMPARISON:  Chest radiograph 05/21/2017 FINDINGS: The cardiomediastinal contours are normal. The lungs are clear. Pulmonary vasculature is normal. No consolidation, pleural effusion, or pneumothorax. No acute osseous abnormalities are seen. No right rib abnormalities are seen on this non dedicated rib exam. IMPRESSION: Negative radiographs of the chest. Electronically Signed   By: Keith Rake M.D.   On: 10/23/2021 21:43   ? ?Procedures ?Procedures  ? ? ?Medications Ordered in ED ?Medications  ?ketorolac (TORADOL) 30 MG/ML injection 30 mg (30 mg Intravenous Given 10/23/21 2144)  ? ? ?ED Course/ Medical Decision Making/ A&P ?Clinical Course as of 10/23/21 2249  ?Thu Oct 23, 2021  ?2229 D-dimer, quantitative ?Negative [JK]  ?2229 CBC with Differential ?Normal [JK]  ?9381 Basic metabolic panel ?normal [JK]  ?[facility] 2 View ?CXR without acute findings. [JK]  ?  ?Clinical Course User Index ?[JK] Dorie Rank, MD  ? ?                        ?Medical Decision Making ?Amount and/or Complexity of Data Reviewed ?Labs: ordered. Decision-making details documented in ED Course. ?Radiology: ordered. Decision-making details documented in ED Course. ? ?Risk ?Prescription drug  management. ? ? ?\Patient presented to the ED for evaluation of persistent chest wall pain.  No signs of acute infection.  No rash noted.  CBC and metabolic panel reassuring.  D-dimer is negative doubt PE.  Chest x-ray is not showing any signs of pneumonia or pneumothorax.  No mass noted.  Will discharge home with medications for pain.  Patient is seeing his primary care doctor and orthopedic doctor for further evaluation ? ? ? ? ? ? ?Final Clinical Impression(s) /  ED Diagnoses ?Final diagnoses:  ?Chest wall pain  ? ? ?Rx / DC Orders ?ED Discharge Orders   ? ?      Ordered  ?  oxyCODONE-acetaminophen (PERCOCET/ROXICET) 5-325 MG tablet  Every 6 hours PRN       ? 10/23/21 2248  ?  etodolac (LODINE) 300 MG capsule  Every 8 hours       ?Note to Pharmacy: As needed for pain  ? 10/23/21 2248  ?  cyclobenzaprine (FLEXERIL) 10 MG tablet  2 times daily PRN       ? 10/23/21 2248  ? ?  ?  ? ?  ? ? ?  ?Dorie Rank, MD ?10/23/21 2249 ? ?

## 2021-10-23 NOTE — Discharge Instructions (Signed)
Take the medications as needed for pain.  Follow-up with your primary care doctor and orthopedic doctor ?

## 2021-10-24 ENCOUNTER — Ambulatory Visit: Payer: 59 | Admitting: Family Medicine

## 2021-10-24 NOTE — Telephone Encounter (Signed)
I reviewed the records and I did order a chest CT. Hopefully this will be approved  ?

## 2021-10-24 NOTE — Addendum Note (Signed)
Addended by: Alysia Penna A on: 10/24/2021 12:50 PM ? ? Modules accepted: Orders ? ?

## 2021-10-29 ENCOUNTER — Encounter: Payer: Self-pay | Admitting: Family Medicine

## 2021-10-29 NOTE — Telephone Encounter (Signed)
CT Chest scheduled for 11/06/21 at 4pm at [address].  Information given to patient.  ?

## 2021-11-03 ENCOUNTER — Other Ambulatory Visit: Payer: Self-pay | Admitting: Family Medicine

## 2021-11-03 ENCOUNTER — Encounter: Payer: Self-pay | Admitting: Family Medicine

## 2021-11-03 NOTE — Telephone Encounter (Signed)
Pt seen by Dr Elease Hashimoto on 10/08/2021 ?Rx sent to pt pharmacy on 10/08/2021 for 30 tablets ?Please advise ?

## 2021-11-03 NOTE — Telephone Encounter (Signed)
Pt scheduled PMV on 11/10/2021 per Dr Sarajane Jews ?

## 2021-11-04 MED ORDER — OXYCODONE-ACETAMINOPHEN 5-325 MG PO TABS: 1.0000 | ORAL_TABLET | Freq: Four times a day (QID) | ORAL | 0 refills | Status: AC | PRN

## 2021-11-04 NOTE — Telephone Encounter (Signed)
I sent in for 20 more Percocet  ?

## 2021-11-06 ENCOUNTER — Ambulatory Visit (INDEPENDENT_AMBULATORY_CARE_PROVIDER_SITE_OTHER)
Admission: RE | Admit: 2021-11-06 | Discharge: 2021-11-06 | Disposition: A | Discharge status: Home / Self Care | Payer: 59 | Source: Ambulatory Visit | Attending: Family Medicine | Admitting: Family Medicine

## 2021-11-06 DIAGNOSIS — R0781 Pleurodynia: Secondary | ICD-10-CM | POA: Diagnosis not present

## 2021-11-06 MED ORDER — IOHEXOL 300 MG/ML  SOLN
80.0000 mL | Freq: Once | INTRAMUSCULAR | Status: AC | PRN
  Administered 2021-11-06: 80 mL via INTRAVENOUS

## 2021-11-10 ENCOUNTER — Encounter: Payer: Self-pay | Admitting: Family Medicine

## 2021-11-10 ENCOUNTER — Ambulatory Visit: Payer: 59 | Admitting: Family Medicine

## 2021-11-10 VITALS — BP 120/82 | HR 73 | Temp 98.6°F | Wt 202.0 lb

## 2021-11-10 DIAGNOSIS — M5414 Radiculopathy, thoracic region: Secondary | ICD-10-CM

## 2021-11-10 NOTE — Progress Notes (Signed)
? ?  Subjective:  ? ? Patient ID: Marcus Pruitt, male    DOB: [birth year], 51 y.o.   MRN: [id number] ? ?HPI ?Here to follow up on  severe sharp pain in the right middle chest area that started about 7 weeks ago. He recently had a chest CT which was normal. Now he says he is also getting sharp pains in the middle of his back. He gets temporary relief from hydrocodone, but he only takes this when he has to. Heat and BioFreeze and Lidocaine patches help a little. He also needs to have some restrictions for his job.  ? ? ?Review of Systems  ?Constitutional: Negative.   ?Respiratory: Negative.    ?Cardiovascular:  Positive for chest pain. Negative for palpitations and leg swelling.  ?Musculoskeletal:  Positive for back pain.  ? ?   ?Objective:  ? Physical Exam ?Constitutional:   ?   Appearance: Normal appearance.  ?   Comments: In some pain  ?Cardiovascular:  ?   Rate and Rhythm: Normal rate and regular rhythm.  ?   Pulses: Normal pulses.  ?   Heart sounds: Normal heart sounds.  ?Pulmonary:  ?   Effort: Pulmonary effort is normal.  ?   Breath sounds: Normal breath sounds.  ?   Comments: Tender along the right anterior chest area at the nipple line  ?Musculoskeletal:  ?   Comments: He is tender over the middle thoracic spine and just tot he right of this area. ROM is full   ?Neurological:  ?   Mental Status: He is alert.  ? ? ? ? ? ?   ?Assessment & Plan:  ?He has chronic rib pain that may actually be radiating from the thoracic spine. We will st up a thoracic spine MRI, and we will go from there.  ?Alysia Penna, MD ? ? ?

## 2021-11-17 ENCOUNTER — Telehealth: Payer: Self-pay | Admitting: Family Medicine

## 2021-11-17 NOTE — Telephone Encounter (Signed)
Pt requesting a refill of  oxyCODONE-acetaminophen (PERCOCET/ROXICET) 5-325 MG tablet   [facility] [place], [address] AT [address] Phone:  [phone]  Fax:  [phone]

## 2021-11-17 NOTE — Addendum Note (Signed)
Addended by: Alysia Penna A on: 11/17/2021 07:35 AM   Modules accepted: Orders

## 2021-11-18 ENCOUNTER — Encounter: Payer: Self-pay | Admitting: Family Medicine

## 2021-11-18 ENCOUNTER — Other Ambulatory Visit: Payer: Self-pay | Admitting: Family Medicine

## 2021-11-18 MED ORDER — OXYCODONE-ACETAMINOPHEN 5-325 MG PO TABS
1.0000 | ORAL_TABLET | ORAL | 0 refills | Status: AC | PRN
Start: 2021-11-18 — End: 2021-11-23

## 2021-11-18 NOTE — Telephone Encounter (Signed)
I sent in for #30

## 2021-11-18 NOTE — Telephone Encounter (Signed)
I understand his frustration. I do not know why the scan was denied either. I do think the the Sports Medicine provider can help him though. And I will continue to supply him with any needed pain medications until he sees them

## 2021-11-18 NOTE — Telephone Encounter (Signed)
Notification for refill was sent through my chart message.

## 2021-11-18 NOTE — Telephone Encounter (Signed)
Last OV- 11/10/21.     *Patient sent My Chart message with concerns about denied MRI*  Appointment in Sports Medicine-11/20/21

## 2021-11-19 NOTE — Progress Notes (Unsigned)
Subjective:    CC: R-sided thoracic rib pain  I, Wendy Poet, LAT, ATC, am serving as scribe for Dr. Lynne Leader.  HPI: Pt is a 51 y/o male c/o R-sided thoracic rib pain x approximately 2 months.  He specifically locates his pain to R-side ribs w/ radiating pain to the RUQ, R-side of the thoracic back, and into L scapular area.  Radiating pain: yes Aggravating factors: trunk flexion, leaning to the R, R shoulder ABD Treatments tried: hydrocodone; Biofreeze; lidocaine patches, oxycodone,   Diagnostic testing: Chest CT w/ contrast- 11/06/21; Chest XR- 10/23/21  Pertinent review of Systems: No fevers or chills.  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 History of prostate cancer. History of a penile implant. [employer] penile implant model number [id number]   Objective:    Vitals:   11/20/21 0811  BP: (!) 162/98  Pulse: 70  SpO2: 98%   General: Well Developed, well nourished, and in no acute distress.   MSK: Chest wall: Palpation right lateral chest wall mildly. Decreased thoracic motion pain with lateral flexion.   Lab and Radiology Results  EXAM: CT CHEST WITH CONTRAST   TECHNIQUE: Multidetector CT imaging of the chest was performed during intravenous contrast administration.   RADIATION DOSE REDUCTION: This exam was performed according to the departmental dose-optimization program which includes automated exposure control, adjustment of the mA and/or kV according to patient size and/or use of iterative reconstruction technique.   CONTRAST:  57m OMNIPAQUE IOHEXOL 300 MG/ML  SOLN   COMPARISON:  Chest x-ray from 10/23/2021   FINDINGS: Cardiovascular: Thoracic aorta and its branches are within normal limits. No aneurysmal dilatation or dissection is seen. No cardiac enlargement is noted. Although the pulmonary artery although not timed for embolus evaluation it shows no central filling  defects.   Mediastinum/Nodes: Thoracic inlet is within normal limits. No hilar or mediastinal adenopathy is noted. The esophagus is within normal limits.   Lungs/Pleura: Lungs are well aerated bilaterally. No focal infiltrate or sizable effusion is seen. No parenchymal nodules noted.   Upper Abdomen: Visualized upper abdomen is unremarkable.   Musculoskeletal: No acute bony abnormality is noted.   IMPRESSION: No acute rib fracture or soft tissue abnormality to correspond with the given clinical history.   No acute abnormality noted.     Electronically Signed   By: MInez CatalinaM.D.   On: 11/09/2021 02:15 I, ELynne Leader personally (independently) visualized and performed the interpretation of the images attached in this note.    Impression and Recommendations:    Assessment and Plan: 51y.o. male with right chest wall pain.  This has been ongoing for 2 months and has occurred without injury.  He had a good work-up so far with chest x-ray and subsequently a CT scan of the chest without clear cause.  His symptoms are concerning for thoracic radiculopathy.  He has had good evaluation and treatment ongoing for the last 6+ weeks starting when he first was evaluated on April 4 by his primary care provider office.  At this point he is failing conservative management including a physician directed exercise.  Plan for MRI thoracic spine to evaluate for thoracic radiculopathy and determine future treatment.  I will go ahead and proactively order physical therapy as well as that could be a treatment option for thoracic radiculopathy especially as this treatment can take a week or 2 to get started.  Recheck after MRI.  Of note he does have a  penile implant but after doing some research it does appear to be MRI safe up to 3 Tesla.  Provided the print off of the MRI information for this device.Marland Kitchen  PDMP not reviewed this encounter. Orders Placed This Encounter  Procedures   MR THORACIC SPINE WO  CONTRAST    Standing Status:   Future    Standing Expiration Date:   11/21/2022    Order Specific Question:   What is the patient's sedation requirement?    Answer:   No Sedation    Order Specific Question:   Does the patient have a pacemaker or implanted devices?    Answer:   Yes    Order Specific Question:   Manufacturer of pacemake or implanted device?    Answer:   Model Number is [id number]    Order Specific Question:   Year of pacemaker or implanted device?    Answer:   05/03/2018    Order Specific Question:   Preferred imaging location?    Answer:   Product/process development scientist (table limit-350lbs)   Ambulatory referral to Physical Therapy    Referral Priority:   Routine    Referral Type:   Physical Medicine    Referral Reason:   Specialty Services Required    Requested Specialty:   Physical Therapy    Number of Visits Requested:   1   No orders of the defined types were placed in this encounter.   Discussed warning signs or symptoms. Please see discharge instructions. Patient expresses understanding.   The above documentation has been reviewed and is accurate and complete Lynne Leader, M.D.

## 2021-11-20 ENCOUNTER — Ambulatory Visit: Payer: 59 | Admitting: Family Medicine

## 2021-11-20 VITALS — BP 162/98 | HR 70 | Ht 67.0 in | Wt 202.8 lb

## 2021-11-20 DIAGNOSIS — Z960 Presence of urogenital implants: Secondary | ICD-10-CM | POA: Diagnosis not present

## 2021-11-20 DIAGNOSIS — M5414 Radiculopathy, thoracic region: Secondary | ICD-10-CM | POA: Diagnosis not present

## 2021-11-20 NOTE — Patient Instructions (Addendum)
Thank you for coming in today.   I've referred you to Physical Therapy.  Let us know if you don't hear from them in one week.   You should hear from MRI scheduling within 1 week. If you do not hear please let me know.    Check back after MRI

## 2021-11-22 ENCOUNTER — Encounter: Payer: Self-pay | Admitting: Family Medicine

## 2021-11-26 ENCOUNTER — Telehealth: Payer: Self-pay | Admitting: Family Medicine

## 2021-11-26 NOTE — Telephone Encounter (Signed)
Peer-to-peer completed for T-spine MRI.  Authorization number: [id number] Exp 01/10/22

## 2021-11-26 NOTE — Telephone Encounter (Signed)
Noted and added to referral

## 2021-11-27 MED ORDER — GABAPENTIN 300 MG PO CAPS: 300.0000 mg | ORAL_CAPSULE | Freq: Three times a day (TID) | ORAL | 3 refills | Status: DC | PRN

## 2021-11-29 ENCOUNTER — Other Ambulatory Visit: Payer: 59

## 2021-12-02 ENCOUNTER — Ambulatory Visit (INDEPENDENT_AMBULATORY_CARE_PROVIDER_SITE_OTHER): Payer: 59

## 2021-12-02 DIAGNOSIS — M5414 Radiculopathy, thoracic region: Secondary | ICD-10-CM

## 2021-12-03 ENCOUNTER — Encounter: Payer: Self-pay | Admitting: Family Medicine

## 2021-12-04 ENCOUNTER — Encounter: Payer: Self-pay | Admitting: Physical Therapy

## 2021-12-04 ENCOUNTER — Ambulatory Visit: Payer: 59 | Attending: Family Medicine | Admitting: Physical Therapy

## 2021-12-04 ENCOUNTER — Encounter: Payer: Self-pay | Admitting: Family Medicine

## 2021-12-04 DIAGNOSIS — R29898 Other symptoms and signs involving the musculoskeletal system: Secondary | ICD-10-CM | POA: Insufficient documentation

## 2021-12-04 DIAGNOSIS — R293 Abnormal posture: Secondary | ICD-10-CM | POA: Insufficient documentation

## 2021-12-04 DIAGNOSIS — M546 Pain in thoracic spine: Secondary | ICD-10-CM | POA: Insufficient documentation

## 2021-12-04 DIAGNOSIS — M5414 Radiculopathy, thoracic region: Secondary | ICD-10-CM | POA: Diagnosis not present

## 2021-12-04 NOTE — Progress Notes (Signed)
MRI thoracic spine shows some disc bulging but not definitive nerve pinching to cause your chest symptoms.  I would recommend trying physical therapy and if that does not work we will do a back injection. Recommend scheduling appointment with me in about a month.  We will talk about the MRI in full detail and see how you are doing with physical therapy.  Certainly you could check back with me sooner if you would like.

## 2021-12-04 NOTE — Therapy (Signed)
OUTPATIENT PHYSICAL THERAPY THORACOLUMBAR EVALUATION   Patient Name: Marcus Pruitt MRN: [id number] DOB:June 20, 1971, 51 y.o., male Today's Date: 12/04/2021   PT End of Session - 12/04/21 0936     Visit Number 1    Number of Visits 7    Date for PT Re-Evaluation 01/29/22    Authorization Type [employer]    Authorization Time Period 12/04/21 to 01/29/22 for schedule flexibility    PT Start Time 0852    PT Stop Time 0930    PT Time Calculation (min) 38 min    Equipment Utilized During Treatment Gait belt    Activity Tolerance Patient tolerated treatment well    Behavior During Therapy WFL for tasks assessed/performed             Past Medical History:  Diagnosis Date   Allergy    Blood transfusion without reported diagnosis    Clotting disorder ([place])    RESOLVED NOW PER PT   H/O blood clots    mesenteric   Hearing loss in right ear    History of colon polyps    Hypertension    IBS (irritable bowel syndrome)    with diarrhea   Low back pain    Prostate cancer ([place])    Sleep apnea    on CPAP   Small intestinal bacterial overgrowth 09/22/2017   + Breath test 08/2017   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[place]) 04/2017   Tinnitus    RIGHT EAR   Past Surgical History:  Procedure Laterality Date   ABDOMINAL WOUND DEHISCENCE N/A 05/20/2017   Procedure: REPAIR OF ABDOMINAL WOUND DEHISCENCE AND EVACUATION OF HEMATOMA;  Surgeon: Angelia Mould, MD;  Location: [facility] OR;  Service: Vascular;  Laterality: N/A;   AORTOGRAM N/A 05/15/2017   Procedure: MESENTERIC AORTOGRAM;  Surgeon: Conrad Wichita, MD;  Location: [facility];  Service: Vascular;  Laterality: N/A;   COLONOSCOPY  10/14/2017   per Dr. Carlean Purl, adenomatous polyp, repeat in 3 yrs    [place], URETEROSCOPY AND STENT PLACEMENT Bilateral 02/03/2019   Procedure: [place], URETEROSCOPY AND STENT PLACEMENT;  Surgeon: Alexis Frock, MD;  Location: [facility];  Service: Urology;   Laterality: Bilateral;  [address] STUDY N/A 08/26/2017   Procedure: GIVENS CAPSULE STUDY;  Surgeon: Gatha Mayer, MD;  Location: [place];  Service: Endoscopy;  Laterality: N/A;   HOLMIUM LASER APPLICATION Bilateral 09/29/3293   Procedure: HOLMIUM LASER APPLICATION;  Surgeon: Alexis Frock, MD;  Location: [facility];  Service: Urology;  Laterality: Bilateral;   INGUINAL HERNIA REPAIR Left 02/21/2016   Procedure: LAPAROSCOPIC INGUINAL HERNIA;  Surgeon: Alexis Frock, MD;  Location: [facility];  Service: Urology;  Laterality: Left;   LYMPHADENECTOMY Bilateral 02/21/2016   Procedure: PELVIC LYMPHADENECTOMY;  Surgeon: Alexis Frock, MD;  Location: [facility];  Service: Urology;  Laterality: Bilateral;   MANDIBLE SURGERY  1990   MESENTERIC ARTERY BYPASS N/A 05/15/2017   Procedure: OPEN FENESTRATION SUPERIOR MESENTERIC ARTERY;  Surgeon: Conrad Summerfield, MD;  Location: [facility] OR;  Service: Vascular;  Laterality: N/A;   PATCH ANGIOPLASTY N/A 05/15/2017   Procedure: SUPERIOR MESENTERIC ARTERY PATCH ANGIOPLASTY USING PERI-GUARD PATCH;  Surgeon: Conrad Forrest, MD;  Location: [facility];  Service: Vascular;  Laterality: N/A;   PERCUTANEOUS VENOUS THROMBECTOMY,LYSIS WITH INTRAVASCULAR ULTRASOUND (IVUS) N/A 05/15/2017   Procedure: THROMBECTOMY OF SUPERIOR MESENTERIC ARTERY;  Surgeon: Conrad Bolivia, MD;  Location: [place];  Service: Vascular;  Laterality: N/A;   ROBOT ASSISTED  LAPAROSCOPIC RADICAL PROSTATECTOMY N/A 02/21/2016   Procedure: XI ROBOTIC ASSISTED LAPAROSCOPIC RADICAL PROSTATECTOMY;  Surgeon: Alexis Frock, MD;  Location: [facility];  Service: Urology;  Laterality: N/A;   UMBILICAL HERNIA REPAIR  02/21/2016   Procedure: LAPAROSCOPIC UMBILICAL HERNIA;  Surgeon: Alexis Frock, MD;  Location: [facility];  Service: Urology;;   [name]  07/29/2017   Procedure: Visceral Angiogram;  Surgeon: Conrad Oasis, MD;  Location: [facility];  Service: Cardiovascular;;   Patient Active Problem List    Diagnosis Date Noted   History of penile implant 11/20/2021   Chronic diarrhea after superior mesenteric artery dissection repair 07/22/2021   Hx of adenomatous polyp of colon 07/22/2021   Acquired cyst of kidney 03/31/2018   Class 1 obesity due to excess calories without serious comorbidity with body mass index (BMI) of 30.0 to 30.9 in adult 03/31/2018   Male urinary stress incontinence 03/31/2018   Personal history of prostate cancer 03/31/2018   Peyronie's disease 03/31/2018   Urinary tract stones 03/31/2018   Low volume of ejaculated semen 03/06/2018   Small intestinal bacterial overgrowth 09/22/2017   Diarrhea    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[place]) 08/25/2017   Superior mesenteric vein thrombosis ([place]) 07/16/2017   Mesenteric artery vascular embolism ([place]) 05/15/2017   Vascular disorder of small intestine ([place]) 05/15/2017   Dissection of unspecified artery ([place]) 05/15/2017   Blepharitis of eyelid of right eye 05/08/2014   HTN (hypertension) 03/30/2014   OBSTRUCTIVE SLEEP APNEA 08/04/2010   SNORING 07/21/2010   ERECTILE DYSFUNCTION, ORGANIC 06/13/2010   STRESS REACTION, ACUTE, WITH EMOTIONAL DISTURBANCE 04/18/2009   INSOMNIA 04/18/2009   Unspecified hearing loss 03/06/2009   ALLERGIC RHINITIS 03/06/2009   KNEE PAIN, BILATERAL 03/06/2009   LOW BACK PAIN 03/06/2009   PES PLANUS 03/06/2009    PCP: Alysia Penna   REFERRING PROVIDER: Gregor Hams, MD   REFERRING DIAG: [phone] (ICD-10-CM) - Thoracic radiculopathy   Rationale for Evaluation and Treatment Rehabilitation  THERAPY DIAG:  Pain in thoracic spine - Plan: PT plan of care cert/re-cert  Abnormal posture - Plan: PT plan of care cert/re-cert  Other symptoms and signs involving the musculoskeletal system - Plan: PT plan of care cert/re-cert  ONSET DATE: 35/46/5681   SUBJECTIVE:                                                                                                                                                                                            SUBJECTIVE STATEMENT: Assessment and Plan: 51 y.o. male with right chest wall pain.  This has been ongoing for 2 months and has occurred without injury.  He had a good work-up so far with chest x-ray and subsequently a CT scan of the chest without clear cause.  His symptoms are concerning for thoracic radiculopathy.  He has had good evaluation and treatment ongoing for the last 6+ weeks starting when he first was evaluated on April 4 by his primary care provider office.  At this point he is failing conservative management including a physician directed exercise.  Plan for MRI thoracic spine to evaluate for thoracic radiculopathy and determine future treatment.  I will go ahead and proactively order physical therapy as well as that could be a treatment option for thoracic radiculopathy especially as this treatment can take a week or 2 to get started.  Recheck after MRI.  The pain wraps around from my back around the right side of my ribs; recently I've had pain in my shoulder blades too and have been having issues raising my right arm. It comes and leaves but it does have some numbness and tingling sometimes. Have had some stress and anxiety recently building a house. Have been doing some exercises on my own. Didn't take pain meds this morning so you can see my pain patterns. 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 Pain does not increase with deep breaths, coughing, or sneezing.    PERTINENT HISTORY:  HTN,vascular disorder of small intestine/vascular disorder of small intestine, hearing loss, hernia repair   PAIN:  Are you having pain? Yes: NPRS scale: 3/10 Pain location: R side  Pain description: pressure under my rib cage, sharp  Aggravating factors: sitting too long, bending over to R/reaching to R  Relieving factors: stretching out R side    PRECAUTIONS: Other: no lifting/pulling/pushing at work  per PCP   WEIGHT BEARING RESTRICTIONS No  FALLS:  Has patient fallen in last 6 months? No  LIVING ENVIRONMENT: Lives with: lives alone Lives in: House/apartment Stairs: Yes: External: 21 (3 small flights of 7) steps; can reach both Has following equipment at home: None  OCCUPATION: Engineer, building services   PLOF: Independent, Independent with basic ADLs, Independent with gait, and Independent with transfers  PATIENT GOALS get pain better as much as possible    OBJECTIVE:   DIAGNOSTIC FINDINGS:  IMPRESSION: Mild disc bulging at T7-T8 and T8-T9 resulting in mild spinal canal narrowing. No neural foraminal stenosis or impingement.   Small right paracentral disc protrusion at T9-T10 resulting in mild right subarticular stenosis. No neural foraminal stenosis or impingement.   No abnormal cord signal.  No suspicious osseous lesions.      SCREENING FOR RED FLAGS: Bowel or bladder incontinence: No Spinal tumors: No Cauda equina syndrome: No Compression fracture: No Abdominal aneurysm: No  COGNITION:  Overall cognitive status: Within functional limits for tasks assessed         POSTURE:  Flat L spine and thoracic spine, forward head  PALPATION: Thoracic spine very rigid with very limited movement with PAs but not painful    ROM:   Cervical ROM: flexion/extension WNL but pulling sensation with flexion; cervical rotation OK B; lateral flexion mild limitation B  Thoracic ZOX:WRUEAVW moderately limited and increased pain, extension WNL but improves pain; lateral flexion OK B; rotation OK B  Shoulders: flexion ROM WNL B, abduction ROM WNL B FER T4 B, B FIR T7 B     MMT:    Shoulder flexion: 4+/5 B Shoulder abduction 4+/5 B Shoulder extension 5/5 B IR/ER 4+/5 B  TODAY'S TREATMENT  UBE L2 3 min forward/3 min backward  Thoracic extensions 1x10 Thoracic lateral flexion 1x10 B Thoracic extension stretch with UEs overhead x2 minutes    PATIENT EDUCATION:   Education details: exam findings, anatomy of area and biomechanics of region, HEP, POC Person educated: Patient Education method: Explanation, Demonstration, and Handouts Education comprehension: verbalized understanding and returned demonstration   HOME EXERCISE PROGRAM: [id number]  ASSESSMENT:  CLINICAL IMPRESSION: Patient is a 51 y.o. male who was seen today for physical therapy evaluation and treatment for thoracic radiculopathy. Exam shows significant limitation in thoracic segmental mobility, postural limitations, and muscle tightness; may have some limited rib mobility as well given how pain wraps around his right side. Unable to provoke symptoms in RUE today. Will benefit from trial of skilled PT services to address impairments and attempt to reduce pain.     OBJECTIVE IMPAIRMENTS decreased ROM, decreased strength, hypomobility, increased fascial restrictions, impaired flexibility, impaired UE functional use, improper body mechanics, postural dysfunction, and pain.   ACTIVITY LIMITATIONS carrying, lifting, and reach over head  PARTICIPATION LIMITATIONS: shopping, community activity, and occupation  PERSONAL FACTORS Time since onset of injury/illness/exacerbation are also affecting patient's functional outcome.   REHAB POTENTIAL: Good  CLINICAL DECISION MAKING: Evolving/moderate complexity  EVALUATION COMPLEXITY: Moderate   GOALS: Goals reviewed with patient? Yes  SHORT TERM GOALS: Target date: 12/25/2021  Will be compliant with HEP  Baseline: Goal status: INITIAL  2.  Will have better understanding of postural mechanics and ergonomics  Baseline:  Goal status: INITIAL  3.  Will have no more than 5/10 pain at worst in thoracic spine with no radicular symptoms  Baseline:  Goal status: INITIAL    LONG TERM GOALS: Target date: 01/15/2022  Pain to be no more than 3/10 at worst with no radicular symptoms and no limitations in UE ROM with functional task performance   Baseline:  Goal status: INITIAL  2.  Will be able to lift, push and pull 30# without increase in pain  Baseline:  Goal status: INITIAL  3.  Will demonstrate significant improvement in thoracic PA mobility to help reduce pain  Baseline:  Goal status: INITIAL     PLAN: PT FREQUENCY: 1x/week  PT DURATION: 6 weeks  PLANNED INTERVENTIONS: Therapeutic exercises, Therapeutic activity, Neuromuscular re-education, Balance training, Gait training, Patient/Family education, Joint mobilization, Dry Needling, Electrical stimulation, Spinal mobilization, Cryotherapy, Moist heat, Taping, Traction, Ultrasound, Manual therapy, and Re-evaluation.  PLAN FOR NEXT SESSION: thoracic PAs as tolerated, thoracic extension based program (extension improves pain), postural training and strengthening    Addam Goeller U PT DPT PN2  12/04/2021, 9:39 AM

## 2021-12-05 ENCOUNTER — Encounter: Payer: Self-pay | Admitting: Family Medicine

## 2021-12-05 NOTE — Telephone Encounter (Signed)
Any pain medication should now be up to the sports medicine provider

## 2021-12-07 ENCOUNTER — Encounter: Payer: Self-pay | Admitting: Family Medicine

## 2021-12-09 MED ORDER — OXYCODONE-ACETAMINOPHEN 5-325 MG PO TABS
1.0000 | ORAL_TABLET | Freq: Three times a day (TID) | ORAL | 0 refills | Status: DC | PRN
Start: 2021-12-09 — End: 2022-03-12

## 2021-12-10 ENCOUNTER — Ambulatory Visit: Payer: 59 | Admitting: Physical Therapy

## 2021-12-10 ENCOUNTER — Encounter: Payer: Self-pay | Admitting: Physical Therapy

## 2021-12-10 DIAGNOSIS — M546 Pain in thoracic spine: Secondary | ICD-10-CM | POA: Diagnosis not present

## 2021-12-10 DIAGNOSIS — R29898 Other symptoms and signs involving the musculoskeletal system: Secondary | ICD-10-CM

## 2021-12-10 DIAGNOSIS — R293 Abnormal posture: Secondary | ICD-10-CM

## 2021-12-10 NOTE — Therapy (Signed)
OUTPATIENT PHYSICAL THERAPY THORACOLUMBAR EVALUATION   Patient Name: Marcus Pruitt MRN: [id number] DOB:1971-01-01, 51 y.o., male Today's Date: 12/10/2021   PT End of Session - 12/10/21 0811     Visit Number 2    Number of Visits 7    Date for PT Re-Evaluation 01/29/22    Authorization Type UHC    Authorization Time Period 12/04/21 to 01/29/22 for schedule flexibility    PT Start Time 0802    PT Stop Time 0843    PT Time Calculation (min) 41 min    Equipment Utilized During Treatment Gait belt    Activity Tolerance Patient tolerated treatment well    Behavior During Therapy WFL for tasks assessed/performed              Past Medical History:  Diagnosis Date   Allergy    Blood transfusion without reported diagnosis    Clotting disorder ([place])    RESOLVED NOW PER PT   H/O blood clots    mesenteric   Hearing loss in right ear    History of colon polyps    Hypertension    IBS (irritable bowel syndrome)    with diarrhea   Low back pain    Prostate cancer ([place])    Sleep apnea    on CPAP   Small intestinal bacterial overgrowth 09/22/2017   + Breath test 08/2017   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 Carlean Purl, adenomatous polyp, repeat in 3 yrs    [place], URETEROSCOPY AND STENT PLACEMENT Bilateral 02/03/2019   Procedure: [place], URETEROSCOPY AND STENT PLACEMENT;  Surgeon: Alexis Frock, MD;  [facility];  Service: Urology;   Laterality: Bilateral;  [address] [place] STUDY N/A 08/26/2017   Procedure: GIVENS CAPSULE STUDY;  Surgeon: Gatha Mayer, MD;  Location: [place];  Service: Endoscopy;  Laterality: N/A;   HOLMIUM LASER APPLICATION Bilateral 4/0/1027   Procedure: HOLMIUM LASER APPLICATION;  Surgeon: Alexis Frock, MD;  [facility];  Service: Urology;  Laterality: Bilateral;   INGUINAL HERNIA REPAIR Left 02/21/2016   Procedure: LAPAROSCOPIC INGUINAL HERNIA;  Surgeon: Alexis Frock, MD;  [facility];  Service: Urology;  Laterality: Left;   LYMPHADENECTOMY Bilateral 02/21/2016   Procedure: PELVIC LYMPHADENECTOMY;  Surgeon: Alexis Frock, MD;  [facility];  Service: Urology;  Laterality: Bilateral;   MANDIBLE SURGERY  1990   MESENTERIC ARTERY BYPASS N/A 05/15/2017   Procedure: OPEN FENESTRATION SUPERIOR MESENTERIC ARTERY;  Surgeon: Conrad [place], MD;  Location: [facility] OR;  Service: Vascular;  Laterality: N/A;   PATCH ANGIOPLASTY N/A 05/15/2017   Procedure: SUPERIOR MESENTERIC ARTERY PATCH ANGIOPLASTY USING PERI-GUARD PATCH;  Surgeon: Conrad [place], MD;  Location: [place];  Service: Vascular;  Laterality: N/A;   PERCUTANEOUS VENOUS THROMBECTOMY,LYSIS WITH INTRAVASCULAR ULTRASOUND (IVUS) N/A 05/15/2017   Procedure: THROMBECTOMY OF SUPERIOR MESENTERIC ARTERY;  Surgeon: Conrad [place], MD;  Location: [place];  Service: Vascular;  Laterality: N/A;   ROBOT  ASSISTED LAPAROSCOPIC RADICAL PROSTATECTOMY N/A 02/21/2016   Procedure: XI ROBOTIC ASSISTED LAPAROSCOPIC RADICAL PROSTATECTOMY;  Surgeon: Alexis Frock, MD;  [facility];  Service: Urology;  Laterality: N/A;   UMBILICAL HERNIA REPAIR  02/21/2016   Procedure: LAPAROSCOPIC UMBILICAL HERNIA;  Surgeon: Alexis Frock, MD;  [facility];  Service: Urology;;   [name]  07/29/2017   Procedure: Visceral Angiogram;  Surgeon: Conrad [place], MD;  Location: [place] [facility];  Service: Cardiovascular;;   Patient Active Problem List    Diagnosis Date Noted   History of penile implant 11/20/2021   Chronic diarrhea after superior mesenteric artery dissection repair 07/22/2021   Hx of adenomatous polyp of colon 07/22/2021   Acquired cyst of kidney 03/31/2018   Class 1 obesity due to excess calories without serious comorbidity with body mass index (BMI) of 30.0 to 30.9 in adult 03/31/2018   Male urinary stress incontinence 03/31/2018   Personal history of prostate cancer 03/31/2018   Peyronie's disease 03/31/2018   Urinary tract stones 03/31/2018   Low volume of ejaculated semen 03/06/2018   Small intestinal bacterial overgrowth 09/22/2017   Diarrhea    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[place]) 08/25/2017   Superior mesenteric vein thrombosis ([place]) 07/16/2017   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   Vascular disorder of small intestine ([place]) 05/15/2017   Dissection of unspecified artery ([place]) 05/15/2017   Blepharitis of eyelid of right eye 05/08/2014   HTN (hypertension) 03/30/2014   OBSTRUCTIVE SLEEP APNEA 08/04/2010   SNORING 07/21/2010   ERECTILE DYSFUNCTION, ORGANIC 06/13/2010   STRESS REACTION, ACUTE, WITH EMOTIONAL DISTURBANCE 04/18/2009   INSOMNIA 04/18/2009   Unspecified hearing loss 03/06/2009   ALLERGIC RHINITIS 03/06/2009   KNEE PAIN, BILATERAL 03/06/2009   LOW BACK PAIN 03/06/2009   PES PLANUS 03/06/2009    PCP: Alysia Penna   REFERRING PROVIDER: Gregor Hams, MD   REFERRING DIAG: [phone] (ICD-10-CM) - Thoracic radiculopathy   Rationale for Evaluation and Treatment Rehabilitation  THERAPY DIAG:  Pain in thoracic spine  Abnormal posture  Other symptoms and signs involving the musculoskeletal system  ONSET DATE: 11/20/2021   SUBJECTIVE:                                                                                                                                                                                           SUBJECTIVE STATEMENT:   I had to take my pill this  morning, I was hurting pretty bad. It comes and goes. It hurt a little after I left last time, stretching does really help usually. I'm going to the [place] soon (leaving tomorrow 5am). Doing HEP at  home OK. Usually I feel the pain when I wake up.    PERTINENT HISTORY:  HTN,vascular disorder of small intestine/vascular disorder of small intestine, hearing loss, hernia repair   PAIN:  Are you having pain? Yes: NPRS scale: 0/10 Pain location:   Pain description:   Aggravating factors:   Relieving factors:     PRECAUTIONS: Other: no lifting/pulling/pushing at work per PCP   WEIGHT BEARING RESTRICTIONS No  FALLS:  Has patient fallen in last 6 months? No  LIVING ENVIRONMENT: Lives with: lives alone Lives in: House/apartment Stairs: Yes: External: 21 (3 small flights of 7) steps; can reach both Has following equipment at home: None  OCCUPATION: Engineer, building services   PLOF: Independent, Independent with basic ADLs, Independent with gait, and Independent with transfers  PATIENT GOALS get pain better as much as possible         TODAY'S TREATMENT   12/10/21  UBE L2.5 3 min forward/3 min backward   Thoracic extensions 1x15  Thoracic extensions with rotation 1x10 B Thoracic reach overs for lateral trunk flexion 1x10 B  Supine thoracic extension stretch with UEs overhead x2 minutes   Cat-cow 1x15 with 2 second holds  Thoracic PAs grade II  Ts, Is, Ys on wall 1x10 each with 2 second holds focus on scapular retraction  Pec stretch in doorframe 3x30 seconds        EVAL UBE L2 3 min forward/3 min backward  Thoracic extensions 1x10 Thoracic lateral flexion 1x10 B Thoracic extension stretch with UEs overhead x2 minutes    PATIENT EDUCATION:  Education details: HEP updates, potential DN Person educated: Patient Education method: Explanation, Demonstration, and Handouts Education comprehension: verbalized understanding and returned demonstration   HOME EXERCISE  PROGRAM: [email or web address]  ASSESSMENT:  CLINICAL IMPRESSION:  Andrus arrives today doing OK, felt alright after leaving here after eval but going to the [place] tomorrow. Warmed up on the UBE, then continued working on thoracic extension based interventions. Also started working on thoracic PAs today as tolerated. I do think that some of his pain may be driven by soft tissue impairment. Will continue to progress as able.    OBJECTIVE IMPAIRMENTS decreased ROM, decreased strength, hypomobility, increased fascial restrictions, impaired flexibility, impaired UE functional use, improper body mechanics, postural dysfunction, and pain.   ACTIVITY LIMITATIONS carrying, lifting, and reach over head  PARTICIPATION LIMITATIONS: shopping, community activity, and occupation  PERSONAL FACTORS Time since onset of injury/illness/exacerbation are also affecting patient's functional outcome.   REHAB POTENTIAL: Good  CLINICAL DECISION MAKING: Evolving/moderate complexity  EVALUATION COMPLEXITY: Moderate   GOALS: Goals reviewed with patient? Yes  SHORT TERM GOALS: Target date: 12/31/2021  Will be compliant with HEP  Baseline: Goal status: INITIAL  2.  Will have better understanding of postural mechanics and ergonomics  Baseline:  Goal status: INITIAL  3.  Will have no more than 5/10 pain at worst in thoracic spine with no radicular symptoms  Baseline:  Goal status: INITIAL    LONG TERM GOALS: Target date: 01/21/2022  Pain to be no more than 3/10 at worst with no radicular symptoms and no limitations in UE ROM with functional task performance  Baseline:  Goal status: INITIAL  2.  Will be able to lift, push and pull 30# without increase in pain  Baseline:  Goal status: INITIAL  3.  Will demonstrate significant improvement in thoracic PA mobility to help reduce pain  Baseline:  Goal status: INITIAL     PLAN: PT FREQUENCY: 1x/week  PT DURATION:  6 weeks  PLANNED INTERVENTIONS:  Therapeutic exercises, Therapeutic activity, Neuromuscular re-education, Balance training, Gait training, Patient/Family education, Joint mobilization, Dry Needling, Electrical stimulation, Spinal mobilization, Cryotherapy, Moist heat, Taping, Traction, Ultrasound, Manual therapy, and Re-evaluation.  PLAN FOR NEXT SESSION: thoracic PAs as tolerated, thoracic extension based program (extension improves pain), postural training and strengthening. Check lats- would he benefit from DN here?    Ann Lions PT DPT PN2  12/10/2021, 8:46 AM

## 2021-12-17 ENCOUNTER — Encounter: Payer: Self-pay | Admitting: Physical Therapy

## 2021-12-17 ENCOUNTER — Ambulatory Visit: Payer: 59 | Admitting: Physical Therapy

## 2021-12-17 DIAGNOSIS — R293 Abnormal posture: Secondary | ICD-10-CM

## 2021-12-17 DIAGNOSIS — M546 Pain in thoracic spine: Secondary | ICD-10-CM | POA: Diagnosis not present

## 2021-12-17 DIAGNOSIS — R29898 Other symptoms and signs involving the musculoskeletal system: Secondary | ICD-10-CM

## 2021-12-17 NOTE — Therapy (Signed)
OUTPATIENT PHYSICAL THERAPY THORACOLUMBAR EVALUATION   Patient Name: Marcus Pruitt MRN: [id number] DOB:Dec 09, 1970, 51 y.o., male Today's Date: 12/17/2021   PT End of Session - 12/17/21 0848     Visit Number 3    Number of Visits 7    Date for PT Re-Evaluation 01/29/22    Authorization Type UHC    Authorization Time Period 12/04/21 to 01/29/22 for schedule flexibility    PT Start Time 0805    PT Stop Time 0843    PT Time Calculation (min) 38 min    Activity Tolerance Patient tolerated treatment well    Behavior During Therapy [facility] for tasks assessed/performed               Past Medical History:  Diagnosis Date   Allergy    Blood transfusion without reported diagnosis    Clotting disorder ([place])    RESOLVED NOW PER PT   H/O blood clots    mesenteric   Hearing loss in right ear    History of colon polyps    Hypertension    IBS (irritable bowel syndrome)    with diarrhea   Low back pain    Prostate cancer ([place])    Sleep apnea    on CPAP   Small intestinal bacterial overgrowth 09/22/2017   + Breath test 08/2017   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 Carlean Purl, adenomatous polyp, repeat in 3 yrs    [place], URETEROSCOPY AND STENT PLACEMENT Bilateral 02/03/2019   Procedure: [place], URETEROSCOPY AND STENT PLACEMENT;  Surgeon: Alexis Frock, MD;  [facility];  Service: Urology;  Laterality: Bilateral;  [address] [place] STUDY N/A 08/26/2017   Procedure: GIVENS CAPSULE STUDY;  Surgeon: Gatha Mayer, MD;  Location: [place];  Service: Endoscopy;  Laterality: N/A;   HOLMIUM LASER APPLICATION Bilateral 09/02/6438   Procedure: HOLMIUM LASER APPLICATION;  Surgeon: Alexis Frock, MD;  [facility];  Service: Urology;  Laterality: Bilateral;   INGUINAL HERNIA REPAIR Left 02/21/2016   Procedure: LAPAROSCOPIC INGUINAL HERNIA;  Surgeon: Alexis Frock, MD;  [facility];  Service: Urology;  Laterality: Left;   LYMPHADENECTOMY Bilateral 02/21/2016   Procedure: PELVIC LYMPHADENECTOMY;  Surgeon: Alexis Frock, MD;  [facility];  Service: Urology;  Laterality: Bilateral;   MANDIBLE SURGERY  1990   MESENTERIC ARTERY BYPASS N/A 05/15/2017   Procedure: OPEN FENESTRATION SUPERIOR MESENTERIC ARTERY;  Surgeon: Conrad [place], MD;  Location: [facility] OR;  Service: Vascular;  Laterality: N/A;   PATCH ANGIOPLASTY N/A 05/15/2017   Procedure: SUPERIOR MESENTERIC ARTERY PATCH ANGIOPLASTY USING PERI-GUARD PATCH;  Surgeon: Conrad [place], MD;  Location: [place];  Service: Vascular;  Laterality: N/A;   PERCUTANEOUS VENOUS THROMBECTOMY,LYSIS WITH INTRAVASCULAR ULTRASOUND (IVUS) N/A 05/15/2017   Procedure: THROMBECTOMY OF SUPERIOR MESENTERIC ARTERY;  Surgeon: Conrad [place], MD;  Location: [place];  Service: Vascular;  Laterality: N/A;   [place] N/A 02/21/2016  Procedure: XI ROBOTIC ASSISTED LAPAROSCOPIC RADICAL PROSTATECTOMY;  Surgeon: Alexis Frock, MD;  [facility];  Service: Urology;  Laterality: N/A;   UMBILICAL HERNIA REPAIR  02/21/2016   Procedure: LAPAROSCOPIC UMBILICAL HERNIA;  Surgeon: Alexis Frock, MD;  [facility];  Service: Urology;;   [name]  07/29/2017   Procedure: Visceral Angiogram;  Surgeon: Conrad [place], MD;  Location: [place] [facility];  Service: Cardiovascular;;   Patient Active Problem List   Diagnosis Date Noted   History of penile  implant 11/20/2021   Chronic diarrhea after superior mesenteric artery dissection repair 07/22/2021   Hx of adenomatous polyp of colon 07/22/2021   Acquired cyst of kidney 03/31/2018   Class 1 obesity due to excess calories without serious comorbidity with body mass index (BMI) of 30.0 to 30.9 in adult 03/31/2018   Male urinary stress incontinence 03/31/2018   Personal history of prostate cancer 03/31/2018   Peyronie's disease 03/31/2018   Urinary tract stones 03/31/2018   Low volume of ejaculated semen 03/06/2018   Small intestinal bacterial overgrowth 09/22/2017   Diarrhea    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[place]) 08/25/2017   Superior mesenteric vein thrombosis ([place]) 07/16/2017   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   Vascular disorder of small intestine ([place]) 05/15/2017   Dissection of unspecified artery ([place]) 05/15/2017   Blepharitis of eyelid of right eye 05/08/2014   HTN (hypertension) 03/30/2014   OBSTRUCTIVE SLEEP APNEA 08/04/2010   SNORING 07/21/2010   ERECTILE DYSFUNCTION, ORGANIC 06/13/2010   STRESS REACTION, ACUTE, WITH EMOTIONAL DISTURBANCE 04/18/2009   INSOMNIA 04/18/2009   Unspecified hearing loss 03/06/2009   ALLERGIC RHINITIS 03/06/2009   KNEE PAIN, BILATERAL 03/06/2009   LOW BACK PAIN 03/06/2009   PES PLANUS 03/06/2009    PCP: Alysia Penna   REFERRING PROVIDER: Gregor Hams, MD   REFERRING DIAG: [phone] (ICD-10-CM) - Thoracic radiculopathy   Rationale for Evaluation and Treatment Rehabilitation  THERAPY DIAG:  Pain in thoracic spine  Abnormal posture  Other symptoms and signs involving the musculoskeletal system  ONSET DATE: 11/20/2021   SUBJECTIVE:                                                                                                                                                                                           SUBJECTIVE STATEMENT:  Tired today. Its early, I'm tired from vacation. Back is better it didn't  hurt as much in the morning. Haven't taken a pill for 2 days, stretching has been helping.      PERTINENT HISTORY:  HTN,vascular disorder of small intestine/vascular disorder of small intestine, hearing loss, hernia repair  PAIN:  Are you having pain? Yes: NPRS scale: 0/10 Pain location:   Pain description:   Aggravating factors:   Relieving factors:     PRECAUTIONS: Other: no lifting/pulling/pushing at work per PCP   WEIGHT BEARING RESTRICTIONS No  FALLS:  Has patient fallen in last 6 months? No  LIVING ENVIRONMENT: Lives with: lives alone Lives in: House/apartment Stairs: Yes: External: 21 (3 small flights of 7) steps; can reach both Has following equipment at home: None  OCCUPATION: Engineer, building services   PLOF: Independent, Independent with basic ADLs, Independent with gait, and Independent with transfers  PATIENT GOALS get pain better as much as possible         TODAY'S TREATMENT   12/17/21  UBE L2.5 3 min forward/3 min backward  R lat stretch on wall 3x30 seconds  Kneeling lat stretch on chair 3x30 seconds   Thoracic PAs grade II-III   Ts, Is, Ys, on wall 1x15 focus on squeezing shoulder blades together   Foam rolling R lat x30 seconds   Shoulder extensions 2x15 20# Rows 2x15 35#  Thoracic extension + rotation 1x10 B Thoracic cat cows x15      12/10/21  UBE L2.5 3 min forward/3 min backward   Thoracic extensions 1x15  Thoracic extensions with rotation 1x10 B Thoracic reach overs for lateral trunk flexion 1x10 B  Supine thoracic extension stretch with UEs overhead x2 minutes   Cat-cow 1x15 with 2 second holds  Thoracic PAs grade II  Ts, Is, Ys on wall 1x10 each with 2 second holds focus on scapular retraction  Pec stretch in doorframe 3x30 seconds        EVAL UBE L2 3 min forward/3 min backward  Thoracic extensions 1x10 Thoracic lateral flexion 1x10 B Thoracic extension stretch with UEs overhead x2 minutes    PATIENT  EDUCATION:  Education details: HEP updates  Person educated: Patient Education method: Explanation, Demonstration, and Handouts Education comprehension: verbalized understanding and returned demonstration   HOME EXERCISE PROGRAM: [id number]  ASSESSMENT:  CLINICAL IMPRESSION:  Safi arrives today doing OK, seems to be doing a little better and hasn't had to take his pain meds for a couple days now, stretches are helping. Continued POC and progressed as tolerated. Tried some more lat stretching and foam rolling today as well, will continue to progress as able and tolerated.    OBJECTIVE IMPAIRMENTS decreased ROM, decreased strength, hypomobility, increased fascial restrictions, impaired flexibility, impaired UE functional use, improper body mechanics, postural dysfunction, and pain.   ACTIVITY LIMITATIONS carrying, lifting, and reach over head  PARTICIPATION LIMITATIONS: shopping, community activity, and occupation  PERSONAL FACTORS Time since onset of injury/illness/exacerbation are also affecting patient's functional outcome.   REHAB POTENTIAL: Good  CLINICAL DECISION MAKING: Evolving/moderate complexity  EVALUATION COMPLEXITY: Moderate   GOALS: Goals reviewed with patient? Yes  SHORT TERM GOALS: Target date: 01/07/2022  Will be compliant with HEP  Baseline: Goal status: INITIAL  2.  Will have better understanding of postural mechanics and ergonomics  Baseline:  Goal status: INITIAL  3.  Will have no more than 5/10 pain at worst in thoracic spine with no radicular symptoms  Baseline:  Goal status: INITIAL    LONG TERM GOALS: Target date: 01/28/2022  Pain to be no more than 3/10 at worst with no radicular symptoms and no limitations in UE ROM with functional task performance  Baseline:  Goal status: INITIAL  2.  Will be able to lift, push and pull 30# without increase in pain  Baseline:  Goal status: INITIAL  3.  Will demonstrate significant improvement in  thoracic PA mobility to help reduce pain  Baseline:  Goal status: INITIAL     PLAN: PT FREQUENCY: 1x/week  PT DURATION: 6 weeks  PLANNED INTERVENTIONS: Therapeutic exercises, Therapeutic activity, Neuromuscular re-education, Balance training, Gait training, Patient/Family education, Joint mobilization, Dry Needling, Electrical stimulation, Spinal mobilization, Cryotherapy, Moist heat, Taping, Traction, Ultrasound, Manual therapy, and Re-evaluation.  PLAN FOR NEXT SESSION: thoracic PAs as tolerated, thoracic extension based program (extension improves pain), postural training and strengthening. Check lats- would he benefit from DN here?    Ann Lions PT DPT PN2  12/17/2021, 8:49 AM

## 2021-12-23 ENCOUNTER — Ambulatory Visit: Payer: 59 | Admitting: Physical Therapy

## 2021-12-23 ENCOUNTER — Encounter: Payer: Self-pay | Admitting: Physical Therapy

## 2021-12-23 DIAGNOSIS — M546 Pain in thoracic spine: Secondary | ICD-10-CM | POA: Diagnosis not present

## 2021-12-23 DIAGNOSIS — R29898 Other symptoms and signs involving the musculoskeletal system: Secondary | ICD-10-CM

## 2021-12-23 DIAGNOSIS — R293 Abnormal posture: Secondary | ICD-10-CM

## 2021-12-23 NOTE — Therapy (Signed)
OUTPATIENT PHYSICAL THERAPY THORACOLUMBAR EVALUATION   Patient Name: Marcus Pruitt MRN: [id number] DOB:April 24, 1971, 51 y.o., male Today's Date: 12/23/2021   PT End of Session - 12/23/21 0931     Visit Number 4    Date for PT Re-Evaluation 01/29/22    Authorization Time Period 12/04/21 to 01/29/22 for schedule flexibility    PT Start Time 0930    PT Stop Time 1015    PT Time Calculation (min) 45 min    Activity Tolerance Patient tolerated treatment well    Behavior During Therapy [facility] for tasks assessed/performed               Past Medical History:  Diagnosis Date   Allergy    Blood transfusion without reported diagnosis    Clotting disorder (HCC)    RESOLVED NOW PER PT   H/O blood clots    mesenteric   Hearing loss in right ear    History of colon polyps    Hypertension    IBS (irritable bowel syndrome)    with diarrhea   Low back pain    Prostate cancer (HCC)    Sleep apnea    on CPAP   Small intestinal bacterial overgrowth 09/22/2017   + Breath test 08/2017   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 Leone Payor, adenomatous polyp, repeat in 3 yrs    CYSTOSCOPY WITH RETROGRADE PYELOGRAM, URETEROSCOPY AND STENT PLACEMENT Bilateral 02/03/2019   Procedure: CYSTOSCOPY WITH RETROGRADE PYELOGRAM, URETEROSCOPY AND STENT PLACEMENT;  Surgeon: Sebastian Ache, MD;  Location: [facility] ORS;  Service: Urology;  Laterality: Bilateral;  75 MINS   GIVENS CAPSULE STUDY N/A 08/26/2017   Procedure: GIVENS  CAPSULE STUDY;  Surgeon: Iva Boop, MD;  Location: [facility] ENDOSCOPY;  Service: Endoscopy;  Laterality: N/A;   HOLMIUM LASER APPLICATION Bilateral 02/03/2019   Procedure: HOLMIUM LASER APPLICATION;  Surgeon: Sebastian Ache, MD;  Location: [facility]S;  Service: Urology;  Laterality: Bilateral;   INGUINAL HERNIA REPAIR Left 02/21/2016   Procedure: LAPAROSCOPIC INGUINAL HERNIA;  Surgeon: Sebastian Ache, MD;  Location: [facility];  Service: Urology;  Laterality: Left;   LYMPHADENECTOMY Bilateral 02/21/2016   Procedure: PELVIC LYMPHADENECTOMY;  Surgeon: Sebastian Ache, MD;  Location: [facility];  Service: Urology;  Laterality: Bilateral;   MANDIBLE SURGERY  1990   MESENTERIC ARTERY BYPASS N/A 05/15/2017   Procedure: OPEN FENESTRATION SUPERIOR MESENTERIC ARTERY;  Surgeon: Fransisco Hertz, MD;  Location: [facility] OR;  Service: Vascular;  Laterality: N/A;   PATCH ANGIOPLASTY N/A 05/15/2017   Procedure: SUPERIOR MESENTERIC ARTERY PATCH ANGIOPLASTY USING PERI-GUARD PATCH;  Surgeon: Fransisco Hertz, MD;  Location: [facility] OR;  Service: Vascular;  Laterality: N/A;   PERCUTANEOUS VENOUS THROMBECTOMY,LYSIS WITH INTRAVASCULAR ULTRASOUND (IVUS) N/A 05/15/2017   Procedure: THROMBECTOMY OF SUPERIOR MESENTERIC ARTERY;  Surgeon: Fransisco Hertz, MD;  Location: [facility] OR;  Service: Vascular;  Laterality: N/A;   ROBOT ASSISTED LAPAROSCOPIC RADICAL PROSTATECTOMY N/A 02/21/2016   Procedure: XI ROBOTIC ASSISTED LAPAROSCOPIC RADICAL PROSTATECTOMY;  Surgeon: Sebastian Ache, MD;  Location: [facility];  Service: Urology;  Laterality: N/A;   UMBILICAL HERNIA REPAIR  02/21/2016   Procedure: LAPAROSCOPIC UMBILICAL HERNIA;  Surgeon: Sebastian Ache, MD;  Location: [facility];  Service: Urology;;   Sherrie George  07/29/2017   Procedure: Visceral Angiogram;  Surgeon: Fransisco Hertz, MD;  Location: [facility] INVASIVE CV LAB;  Service: Cardiovascular;;   Patient Active Problem List   Diagnosis Date Noted   History of penile implant 11/20/2021   Chronic diarrhea after superior  mesenteric artery dissection repair 07/22/2021   Hx of adenomatous polyp of colon 07/22/2021   Acquired cyst of kidney 03/31/2018   Class 1 obesity due to excess calories without serious comorbidity with body mass index (BMI) of 30.0 to 30.9 in adult 03/31/2018   Male urinary stress incontinence 03/31/2018   Personal history of prostate cancer 03/31/2018   Peyronie's disease 03/31/2018   Urinary tract stones 03/31/2018   Low volume of ejaculated semen 03/06/2018   Small intestinal bacterial overgrowth 09/22/2017   Diarrhea    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   Superior mesenteric vein thrombosis (HCC) 07/16/2017   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/2017   Vascular disorder of small intestine (HCC) 05/15/2017   Dissection of unspecified artery (HCC) 05/15/2017   Blepharitis of eyelid of right eye 05/08/2014   HTN (hypertension) 03/30/2014   OBSTRUCTIVE SLEEP APNEA 08/04/2010   SNORING 07/21/2010   ERECTILE DYSFUNCTION, ORGANIC 06/13/2010   STRESS REACTION, ACUTE, WITH EMOTIONAL DISTURBANCE 04/18/2009   INSOMNIA 04/18/2009   Unspecified hearing loss 03/06/2009   ALLERGIC RHINITIS 03/06/2009   KNEE PAIN, BILATERAL 03/06/2009   LOW BACK PAIN 03/06/2009   PES PLANUS 03/06/2009    PCP: Gershon Crane   REFERRING PROVIDER: Rodolph Bong, MD   REFERRING DIAG: [id number] (ICD-10-CM) - Thoracic radiculopathy   Rationale for Evaluation and Treatment Rehabilitation  THERAPY DIAG:  Pain in thoracic spine  Abnormal posture  Other symptoms and signs involving the musculoskeletal system  ONSET DATE: 11/20/2021   SUBJECTIVE:                                                                                                                                                                                           SUBJECTIVE STATEMENT:  Feeling pretty good, has not take the pills in two days,    PERTINENT HISTORY:  HTN,vascular disorder of small  intestine/vascular disorder of small intestine, hearing loss, hernia repair   PAIN:  Are you having pain? Yes: NPRS scale: 0/10 Pain location:   Pain description:   Aggravating factors:   Relieving factors:     PRECAUTIONS: Other: no lifting/pulling/pushing at  work per PCP   WEIGHT BEARING RESTRICTIONS No  FALLS:  Has patient fallen in last 6 months? No  LIVING ENVIRONMENT: Lives with: lives alone Lives in: House/apartment Stairs: Yes: External: 21 (3 small flights of 7) steps; can reach both Has following equipment at home: None  OCCUPATION: Solicitor   PLOF: Independent, Independent with basic ADLs, Independent with gait, and Independent with transfers  PATIENT GOALS get pain better as much as possible         TODAY'S TREATMENT   12/23/21  UBE L2.5 2 min each Lat stretch 4x10''  Rows & Lats 35lb 2x10 Shoulder Ext 15lb 2x10 ER blue 2x10 S2S OHP yellow ball 2x10  Horiz Abd blue 2x10  Lat stretch against wall with pillow case. 4x10''  12/17/21  UBE L2.5 3 min forward/3 min backward  R lat stretch on wall 3x30 seconds  Kneeling lat stretch on chair 3x30 seconds   Thoracic PAs grade II-III   Ts, Is, Ys, on wall 1x15 focus on squeezing shoulder blades together   Foam rolling R lat x30 seconds   Shoulder extensions 2x15 20# Rows 2x15 35#  Thoracic extension + rotation 1x10 B Thoracic cat cows x15      12/10/21  UBE L2.5 3 min forward/3 min backward   Thoracic extensions 1x15  Thoracic extensions with rotation 1x10 B Thoracic reach overs for lateral trunk flexion 1x10 B  Supine thoracic extension stretch with UEs overhead x2 minutes   Cat-cow 1x15 with 2 second holds  Thoracic PAs grade II  Ts, Is, Ys on wall 1x10 each with 2 second holds focus on scapular retraction  Pec stretch in doorframe 3x30 seconds        EVAL UBE L2 3 min forward/3 min backward  Thoracic extensions 1x10 Thoracic lateral flexion 1x10 B Thoracic  extension stretch with UEs overhead x2 minutes    PATIENT EDUCATION:  Education details: HEP updates  Person educated: Patient Education method: Explanation, Demonstration, and Handouts Education comprehension: verbalized understanding and returned demonstration   HOME EXERCISE PROGRAM: [email or web address]  ASSESSMENT:  CLINICAL IMPRESSION:  Olyver arrives today doing OK. Pt reports improvement overall.  Continues with postural strengthening and stretching . Tactiel cue for posture required with seated rows and horizontal abduction. Positive response with both lat stretches. Cue to prevent shoulder protraction with shoulder extensions.     OBJECTIVE IMPAIRMENTS decreased ROM, decreased strength, hypomobility, increased fascial restrictions, impaired flexibility, impaired UE functional use, improper body mechanics, postural dysfunction, and pain.   ACTIVITY LIMITATIONS carrying, lifting, and reach over head  PARTICIPATION LIMITATIONS: shopping, community activity, and occupation  PERSONAL FACTORS Time since onset of injury/illness/exacerbation are also affecting patient's functional outcome.   REHAB POTENTIAL: Good  CLINICAL DECISION MAKING: Evolving/moderate complexity  EVALUATION COMPLEXITY: Moderate   GOALS: Goals reviewed with patient? Yes  SHORT TERM GOALS: Target date: 01/13/2022  Will be compliant with HEP  Baseline: Goal status: INITIAL  2.  Will have better understanding of postural mechanics and ergonomics  Baseline:  Goal status: INITIAL  3.  Will have no more than 5/10 pain at worst in thoracic spine with no radicular symptoms  Baseline:  Goal status: INITIAL    LONG TERM GOALS: Target date: 02/03/2022  Pain to be no more than 3/10 at worst with no radicular symptoms and no limitations in UE ROM with functional task performance  Baseline:  Goal status: INITIAL  2.  Will be able to lift, push and pull 30# without increase in pain  Baseline:  Goal status:  INITIAL  3.  Will demonstrate significant improvement in thoracic PA mobility to help reduce pain  Baseline:  Goal status: INITIAL     PLAN: PT FREQUENCY: 1x/week  PT DURATION: 6 weeks  PLANNED INTERVENTIONS: Therapeutic exercises, Therapeutic activity, Neuromuscular re-education, Balance training, Gait training, Patient/Family education, Joint mobilization, Dry Needling, Electrical stimulation, Spinal mobilization, Cryotherapy, Moist heat, Taping, Traction, Ultrasound, Manual therapy, and Re-evaluation.  PLAN FOR NEXT SESSION: thoracic PAs as tolerated, thoracic extension based program (extension improves pain), postural training and strengthening. Check lats- would he benefit from DN here?    Lerry Liner PT DPT PN2  12/23/2021, 9:32 AM

## 2021-12-26 ENCOUNTER — Encounter: Payer: Self-pay | Admitting: Family Medicine

## 2021-12-31 NOTE — Telephone Encounter (Signed)
I would NOT advise he take these. They almost certainly would raise his BP

## 2022-01-06 ENCOUNTER — Encounter: Payer: Self-pay | Admitting: Physical Therapy

## 2022-01-06 ENCOUNTER — Ambulatory Visit: Payer: 59 | Attending: Family Medicine | Admitting: Physical Therapy

## 2022-01-06 DIAGNOSIS — R293 Abnormal posture: Secondary | ICD-10-CM | POA: Insufficient documentation

## 2022-01-06 DIAGNOSIS — R29898 Other symptoms and signs involving the musculoskeletal system: Secondary | ICD-10-CM | POA: Insufficient documentation

## 2022-01-06 DIAGNOSIS — M546 Pain in thoracic spine: Secondary | ICD-10-CM | POA: Insufficient documentation

## 2022-01-06 NOTE — Therapy (Signed)
OUTPATIENT PHYSICAL THERAPY THORACOLUMBAR EVALUATION   Patient Name: Marcus Pruitt MRN: [id number] DOB:1971-05-27, 51 y.o., male Today's Date: 01/06/2022   PT End of Session - 01/06/22 1101     Visit Number 5    Date for PT Re-Evaluation 01/29/22    PT Start Time 1100    PT Stop Time 1145    PT Time Calculation (min) 45 min    Activity Tolerance Patient tolerated treatment well    Behavior During Therapy WFL for tasks assessed/performed               Past Medical History:  Diagnosis Date   Allergy    Blood transfusion without reported diagnosis    Clotting disorder ([place])    RESOLVED NOW PER PT   H/O blood clots    mesenteric   Hearing loss in right ear    History of colon polyps    Hypertension    IBS (irritable bowel syndrome)    with diarrhea   Low back pain    Prostate cancer (HCC)    Sleep apnea    on CPAP   Small intestinal bacterial overgrowth 09/22/2017   + Breath test 08/2017   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[place]) 04/2017   Tinnitus    RIGHT EAR   Past Surgical History:  Procedure Laterality Date   ABDOMINAL WOUND DEHISCENCE N/A 05/20/2017   Procedure: REPAIR OF ABDOMINAL WOUND DEHISCENCE AND EVACUATION OF HEMATOMA;  Surgeon: Angelia Mould, MD;  Location: [place];  Service: Vascular;  Laterality: N/A;   AORTOGRAM N/A 05/15/2017   Procedure: MESENTERIC AORTOGRAM;  Surgeon: Conrad Penermon, MD;  Location: [place];  Service: Vascular;  Laterality: N/A;   COLONOSCOPY  10/14/2017   per Dr. Carlean Purl, adenomatous polyp, repeat in 3 yrs    [place], URETEROSCOPY AND STENT PLACEMENT Bilateral 02/03/2019   Procedure: [place], URETEROSCOPY AND STENT PLACEMENT;  Surgeon: Alexis Frock, MD;  Location: [facility];  Service: Urology;  Laterality: Bilateral;  [address] [place] STUDY N/A 08/26/2017   Procedure: GIVENS CAPSULE STUDY;  Surgeon: Gatha Mayer, MD;  Location: [place];  Service:  Endoscopy;  Laterality: N/A;   HOLMIUM LASER APPLICATION Bilateral 3/0/0923   Procedure: HOLMIUM LASER APPLICATION;  Surgeon: Alexis Frock, MD;  Location: [facility];  Service: Urology;  Laterality: Bilateral;   INGUINAL HERNIA REPAIR Left 02/21/2016   Procedure: LAPAROSCOPIC INGUINAL HERNIA;  Surgeon: Alexis Frock, MD;  [facility];  Service: Urology;  Laterality: Left;   LYMPHADENECTOMY Bilateral 02/21/2016   Procedure: PELVIC LYMPHADENECTOMY;  Surgeon: Alexis Frock, MD;  [facility];  Service: Urology;  Laterality: Bilateral;   MANDIBLE SURGERY  1990   MESENTERIC ARTERY BYPASS N/A 05/15/2017   Procedure: OPEN FENESTRATION SUPERIOR MESENTERIC ARTERY;  Surgeon: Conrad [place], MD;  Location: [facility] OR;  Service: Vascular;  Laterality: N/A;   PATCH ANGIOPLASTY N/A 05/15/2017   Procedure: SUPERIOR MESENTERIC ARTERY PATCH ANGIOPLASTY USING PERI-GUARD PATCH;  Surgeon: Conrad [place], MD;  Location: [place];  Service: Vascular;  Laterality: N/A;   PERCUTANEOUS VENOUS THROMBECTOMY,LYSIS WITH INTRAVASCULAR ULTRASOUND (IVUS) N/A 05/15/2017   Procedure: THROMBECTOMY OF SUPERIOR MESENTERIC ARTERY;  Surgeon: Conrad [place], MD;  Location: [place];  Service: Vascular;  Laterality: N/A;   [place] N/A 02/21/2016   Procedure: XI ROBOTIC ASSISTED LAPAROSCOPIC RADICAL PROSTATECTOMY;  Surgeon: Alexis Frock, MD;  Location: [facility];  Service: Urology;  Laterality: N/A;   UMBILICAL  HERNIA REPAIR  02/21/2016   Procedure: LAPAROSCOPIC UMBILICAL HERNIA;  Surgeon: Alexis Frock, MD;  Location[facility];  Service: Urology;;   [name]  07/29/2017   Procedure: Visceral Angiogram;  Surgeon: Conrad [place], MD;  Location: [place] CV LAB;  Service: Cardiovascular;;   Patient Active Problem List   Diagnosis Date Noted   History of penile implant 11/20/2021   Chronic diarrhea after superior mesenteric artery dissection repair 07/22/2021   Hx of adenomatous polyp of colon  07/22/2021   Acquired cyst of kidney 03/31/2018   Class 1 obesity due to excess calories without serious comorbidity with body mass index (BMI) of 30.0 to 30.9 in adult 03/31/2018   Male urinary stress incontinence 03/31/2018   Personal history of prostate cancer 03/31/2018   Peyronie's disease 03/31/2018   Urinary tract stones 03/31/2018   Low volume of ejaculated semen 03/06/2018   Small intestinal bacterial overgrowth 09/22/2017   Diarrhea    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[place]) 08/25/2017   Superior mesenteric vein thrombosis ([place]) 07/16/2017   Mesenteric artery vascular embolism ([place]) 05/15/2017   Vascular disorder of small intestine ([place]) 05/15/2017   Dissection of unspecified artery ([place]) 05/15/2017   Blepharitis of eyelid of right eye 05/08/2014   HTN (hypertension) 03/30/2014   OBSTRUCTIVE SLEEP APNEA 08/04/2010   SNORING 07/21/2010   ERECTILE DYSFUNCTION, ORGANIC 06/13/2010   STRESS REACTION, ACUTE, WITH EMOTIONAL DISTURBANCE 04/18/2009   INSOMNIA 04/18/2009   Unspecified hearing loss 03/06/2009   ALLERGIC RHINITIS 03/06/2009   KNEE PAIN, BILATERAL 03/06/2009   LOW BACK PAIN 03/06/2009   PES PLANUS 03/06/2009    PCP: Alysia Penna   REFERRING PROVIDER: Gregor Hams, MD   REFERRING DIAG: [phone] (ICD-10-CM) - Thoracic radiculopathy   Rationale for Evaluation and Treatment Rehabilitation  THERAPY DIAG:  Pain in thoracic spine  Abnormal posture  Other symptoms and signs involving the musculoskeletal system  ONSET DATE: 11/20/2021   SUBJECTIVE:                                                                                                                                                                                           SUBJECTIVE STATEMENT: Tired, been up since three this morning. Felling better    PERTINENT HISTORY:  HTN,vascular disorder of small intestine/vascular disorder of small intestine, hearing loss, hernia repair   PAIN:  Are you  having pain? Yes: NPRS scale: 0/10 Pain location:   Pain description:   Aggravating factors:   Relieving factors:     PRECAUTIONS: Other: no lifting/pulling/pushing at work per PCP   WEIGHT BEARING RESTRICTIONS No  FALLS:  Has patient fallen  in last 6 months? No  LIVING ENVIRONMENT: Lives with: lives alone Lives in: House/apartment Stairs: Yes: External: 21 (3 small flights of 7) steps; can reach both Has following equipment at home: None  OCCUPATION: Engineer, building services   PLOF: Independent, Independent with basic ADLs, Independent with gait, and Independent with transfers  PATIENT GOALS get pain better as much as possible         TODAY'S TREATMENT  01/06/22  UBE L 3 x 3 min each Recumbent bike L4 x 4 min  Shoulder Ext 10lb 2x10 Rows 15lb 2x10 S2S OHP yellow ball 2x10 Rows & Lats 35lb 2x12 ER green 2x10 Horiz Abd blue 2x10 Straight arm pulls 35lb 2x10   Lat stretch 5 x10'' hold    12/23/21  UBE L2.5 2 min each Lat stretch 4x10''  Rows & Lats 35lb 2x10 Shoulder Ext 15lb 2x10 ER blue 2x10 S2S OHP yellow ball 2x10  Horiz Abd blue 2x10  Lat stretch against wall with pillow case. 4x10''  12/17/21  UBE L2.5 3 min forward/3 min backward  R lat stretch on wall 3x30 seconds  Kneeling lat stretch on chair 3x30 seconds   Thoracic PAs grade II-III   Ts, Is, Ys, on wall 1x15 focus on squeezing shoulder blades together   Foam rolling R lat x30 seconds   Shoulder extensions 2x15 20# Rows 2x15 35#  Thoracic extension + rotation 1x10 B Thoracic cat cows x15      12/10/21  UBE L2.5 3 min forward/3 min backward   Thoracic extensions 1x15  Thoracic extensions with rotation 1x10 B Thoracic reach overs for lateral trunk flexion 1x10 B  Supine thoracic extension stretch with UEs overhead x2 minutes   Cat-cow 1x15 with 2 second holds  Thoracic PAs grade II  Ts, Is, Ys on wall 1x10 each with 2 second holds focus on scapular retraction  Pec stretch in  doorframe 3x30 seconds        EVAL UBE L2 3 min forward/3 min backward  Thoracic extensions 1x10 Thoracic lateral flexion 1x10 B Thoracic extension stretch with UEs overhead x2 minutes    PATIENT EDUCATION:  Education details: HEP updates  Person educated: Patient Education method: Explanation, Demonstration, and Handouts Education comprehension: verbalized understanding and returned demonstration   HOME EXERCISE PROGRAM: [id number]  ASSESSMENT:  CLINICAL IMPRESSION:  Ona arrives today doing OK with reports of improvement overall with less pain. All interventions completed well with good strength and ROM. Pt is progressing towards goals.    OBJECTIVE IMPAIRMENTS decreased ROM, decreased strength, hypomobility, increased fascial restrictions, impaired flexibility, impaired UE functional use, improper body mechanics, postural dysfunction, and pain.   ACTIVITY LIMITATIONS carrying, lifting, and reach over head  PARTICIPATION LIMITATIONS: shopping, community activity, and occupation  PERSONAL FACTORS Time since onset of injury/illness/exacerbation are also affecting patient's functional outcome.   REHAB POTENTIAL: Good  CLINICAL DECISION MAKING: Evolving/moderate complexity  EVALUATION COMPLEXITY: Moderate   GOALS: Goals reviewed with patient? Yes  SHORT TERM GOALS: Target date: 01/27/2022  Will be compliant with HEP  Baseline: Goal status: Met  2.  Will have better understanding of postural mechanics and ergonomics  Baseline:  Goal status: Met  3.  Will have no more than 5/10 pain at worst in thoracic spine with no radicular symptoms  Baseline:  Goal status: Met    LONG TERM GOALS: Target date: 02/17/2022  Pain to be no more than 3/10 at worst with no radicular symptoms and no limitations in UE ROM with functional task performance  Baseline:  Goal status: progressing  2.  Will be able to lift, push and pull 30# without increase in pain  Baseline:   Goal status: INITIAL  3.  Will demonstrate significant improvement in thoracic PA mobility to help reduce pain  Baseline:  Goal status: INITIAL     PLAN: PT FREQUENCY: 1x/week  PT DURATION: 6 weeks  PLANNED INTERVENTIONS: Therapeutic exercises, Therapeutic activity, Neuromuscular re-education, Balance training, Gait training, Patient/Family education, Joint mobilization, Dry Needling, Electrical stimulation, Spinal mobilization, Cryotherapy, Moist heat, Taping, Traction, Ultrasound, Manual therapy, and Re-evaluation.  PLAN FOR NEXT SESSION: thoracic PAs as tolerated, thoracic extension based program (extension improves pain), postural training and strengthening. Check lats- would he benefit from DN here?    Kristen U PT DPT PN2  01/06/2022, 11:01 AM

## 2022-01-12 ENCOUNTER — Encounter: Payer: Self-pay | Admitting: Physical Therapy

## 2022-01-12 ENCOUNTER — Ambulatory Visit: Payer: 59 | Admitting: Physical Therapy

## 2022-01-12 DIAGNOSIS — R29898 Other symptoms and signs involving the musculoskeletal system: Secondary | ICD-10-CM

## 2022-01-12 DIAGNOSIS — M546 Pain in thoracic spine: Secondary | ICD-10-CM | POA: Diagnosis not present

## 2022-01-12 DIAGNOSIS — R293 Abnormal posture: Secondary | ICD-10-CM

## 2022-01-12 NOTE — Therapy (Addendum)
OUTPATIENT PHYSICAL THERAPY THORACOLUMBAR TREATMENT   Patient Name: Marcus Pruitt MRN: [id number] DOB:1971/03/10, 51 y.o., male Today's Date: 01/12/2022   PT End of Session - 01/12/22 1016     Visit Number 6    Number of Visits 7    Date for PT Re-Evaluation 01/29/22    Authorization Type U[employer]    Authorization Time Period 12/04/21 to 01/29/22 for schedule flexibility    PT Start Time 1017    PT Stop Time 1100    PT Time Calculation (min) 43 min    Activity Tolerance Patient tolerated treatment well    Behavior During Therapy WFL for tasks assessed/performed               Past Medical History:  Diagnosis Date   Allergy    Blood transfusion without reported diagnosis    Clotting disorder ([place])    RESOLVED NOW PER PT   H/O blood clots    mesenteric   Hearing loss in right ear    History of colon polyps    Hypertension    IBS (irritable bowel syndrome)    with diarrhea   Low back pain    Prostate cancer ([place])    Sleep apnea    on CPAP   Small intestinal bacterial overgrowth 09/22/2017   + Breath test 08/2017   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 Carlean Purl, adenomatous polyp, repeat in 3 yrs    CYSTOSCOPY WITH RETROGRADE PYELOGRAM, URETEROSCOPY AND STENT PLACEMENT Bilateral 02/03/2019   Procedure: [place], URETEROSCOPY AND STENT PLACEMENT;  Surgeon: Alexis Frock, MD;  Location: [facility];  Service: Urology;  Laterality: Bilateral;  [address] STUDY N/A 08/26/2017   Procedure: GIVENS CAPSULE STUDY;  Surgeon: Gatha Mayer, MD;  Location: [place];  Service: Endoscopy;  Laterality: N/A;   HOLMIUM LASER APPLICATION Bilateral 07/30/3084   Procedure: HOLMIUM LASER APPLICATION;  Surgeon: Alexis Frock, MD;  Location: [facility];  Service: Urology;  Laterality: Bilateral;   INGUINAL HERNIA REPAIR Left 02/21/2016   Procedure: LAPAROSCOPIC INGUINAL HERNIA;  Surgeon: Alexis Frock, MD;  Location: [facility];  Service: Urology;  Laterality: Left;   LYMPHADENECTOMY Bilateral 02/21/2016   Procedure: PELVIC LYMPHADENECTOMY;  Surgeon: Alexis Frock, MD;  Location: [facility];  Service: Urology;  Laterality: Bilateral;   MANDIBLE SURGERY  1990   MESENTERIC ARTERY BYPASS N/A 05/15/2017   Procedure: OPEN FENESTRATION SUPERIOR MESENTERIC ARTERY;  Surgeon: Conrad Whittemore, MD;  Location: [facility];  Service: Vascular;  Laterality: N/A;   PATCH ANGIOPLASTY N/A 05/15/2017   Procedure: SUPERIOR MESENTERIC ARTERY PATCH ANGIOPLASTY USING PERI-GUARD PATCH;  Surgeon: Conrad Friant, MD;  Location: [place];  Service: Vascular;  Laterality: N/A;   PERCUTANEOUS VENOUS THROMBECTOMY,LYSIS WITH INTRAVASCULAR ULTRASOUND (IVUS) N/A 05/15/2017   Procedure: THROMBECTOMY OF SUPERIOR MESENTERIC ARTERY;  Surgeon: Conrad [place], MD;  Location: [place];  Service: Vascular;  Laterality: N/A;   [place] N/A 02/21/2016  Procedure: XI ROBOTIC ASSISTED LAPAROSCOPIC RADICAL PROSTATECTOMY;  Surgeon: Alexis Frock, MD;  [facility];  Service: Urology;  Laterality: N/A;   UMBILICAL HERNIA REPAIR  02/21/2016   Procedure: LAPAROSCOPIC UMBILICAL HERNIA;  Surgeon: Alexis Frock, MD;  Location[facility];  Service: Urology;;   [name]  07/29/2017   Procedure: Visceral Angiogram;  Surgeon: Conrad [place], MD;  Location: [facility];  Service: Cardiovascular;;   Patient Active Problem List   Diagnosis Date Noted   History of penile  implant 11/20/2021   Chronic diarrhea after superior mesenteric artery dissection repair 07/22/2021   Hx of adenomatous polyp of colon 07/22/2021   Acquired cyst of kidney 03/31/2018   Class 1 obesity due to excess calories without serious comorbidity with body mass index (BMI) of 30.0 to 30.9 in adult 03/31/2018   Male urinary stress incontinence 03/31/2018   Personal history of prostate cancer 03/31/2018   Peyronie's disease 03/31/2018   Urinary tract stones 03/31/2018   Low volume of ejaculated semen 03/06/2018   Small intestinal bacterial overgrowth 09/22/2017   Diarrhea    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[place]) 08/25/2017   Superior mesenteric vein thrombosis ([place]) 07/16/2017   Mesenteric artery vascular embolism ([place]) 05/15/2017   Vascular disorder of small intestine ([place]) 05/15/2017   Dissection of unspecified artery ([place]) 05/15/2017   Blepharitis of eyelid of right eye 05/08/2014   HTN (hypertension) 03/30/2014   OBSTRUCTIVE SLEEP APNEA 08/04/2010   SNORING 07/21/2010   ERECTILE DYSFUNCTION, ORGANIC 06/13/2010   STRESS REACTION, ACUTE, WITH EMOTIONAL DISTURBANCE 04/18/2009   INSOMNIA 04/18/2009   Unspecified hearing loss 03/06/2009   ALLERGIC RHINITIS 03/06/2009   KNEE PAIN, BILATERAL 03/06/2009   LOW BACK PAIN 03/06/2009   PES PLANUS 03/06/2009    PCP: Alysia Penna   REFERRING PROVIDER: Gregor Hams, MD   REFERRING DIAG: [phone] (ICD-10-CM) - Thoracic radiculopathy   Rationale for Evaluation and Treatment Rehabilitation  THERAPY DIAG:  Pain in thoracic spine  Abnormal posture  Other symptoms and signs involving the musculoskeletal system  ONSET DATE: 11/20/2021   SUBJECTIVE:                                                                                                                                                                                           SUBJECTIVE STATEMENT: I feel good. I haven't had any pain since I've been stretching.      PERTINENT HISTORY:  HTN,vascular disorder of small intestine/vascular disorder of small intestine, hearing loss, hernia repair   PAIN:  Are you having pain? No   PRECAUTIONS: Other: no lifting/pulling/pushing at work per PCP  WEIGHT BEARING RESTRICTIONS No  FALLS:  Has patient fallen in last 6 months? No  LIVING ENVIRONMENT: Lives with: lives alone Lives in: House/apartment Stairs: Yes: External: 21 (3 small flights of 7) steps; can reach both Has following equipment at home: None  OCCUPATION: Engineer, building services   PLOF: Independent, Independent with basic ADLs, Independent with gait, and Independent with transfers  PATIENT GOALS get pain better as much as possible         TODAY'S TREATMENT  01/12/22 UBE L 3 x 3 min each Rows/lat pull downs 45/55# 2x12 Shoulder abduction/flexion 3# 2x12 STS with 10# overhead press with ball 2x10    01/06/22  UBE L 3 x 3 min each Recumbent bike L4 x 4 min  Shoulder Ext 10lb 2x10 Rows 15lb 2x10 S2S OHP yellow ball 2x10 Rows & Lats 35lb 2x12 ER green 2x10 Horiz Abd blue 2x10 Straight arm pulls 35lb 2x10   Lat stretch 5 x10'' hold    12/23/21  UBE L2.5 2 min each Lat stretch 4x10''  Rows & Lats 35lb 2x10 Shoulder Ext 15lb 2x10 ER blue 2x10 S2S OHP yellow ball 2x10  Horiz Abd blue 2x10  Lat stretch against wall with pillow case. 4x10''  12/17/21  UBE L2.5 3 min forward/3 min backward  R lat stretch on wall 3x30 seconds  Kneeling lat stretch on chair 3x30 seconds   Thoracic PAs grade II-III   Ts, Is, Ys, on wall 1x15 focus on squeezing shoulder blades together   Foam rolling R lat x30 seconds   Shoulder extensions 2x15 20# Rows 2x15 35#  Thoracic extension + rotation 1x10 B Thoracic cat cows x15      12/10/21  UBE L2.5 3 min forward/3 min backward   Thoracic extensions 1x15  Thoracic extensions with rotation 1x10 B Thoracic reach overs for lateral trunk flexion 1x10 B  Supine thoracic extension  stretch with UEs overhead x2 minutes   Cat-cow 1x15 with 2 second holds  Thoracic PAs grade II  Ts, Is, Ys on wall 1x10 each with 2 second holds focus on scapular retraction  Pec stretch in doorframe 3x30 seconds        EVAL UBE L2 3 min forward/3 min backward  Thoracic extensions 1x10 Thoracic lateral flexion 1x10 B Thoracic extension stretch with UEs overhead x2 minutes    PATIENT EDUCATION:  Education details: None Person educated: Patient Education method: Consulting civil engineer, Demonstration, and Handouts Education comprehension: verbalized understanding and returned demonstration   HOME EXERCISE PROGRAM: [id number]  ASSESSMENT:  CLINICAL IMPRESSION: Pt enters today doing well with no pain, states he has had no pain since he has been doing his stretches, which he says he has been doing often at home and at work. States prolonged sitting can cause a little bit of pain, but otherwise doing well. Pt has met a majority of his goals and feels ready to continue exercise program and continue strengthening with a gym routine at home.    OBJECTIVE IMPAIRMENTS decreased ROM, decreased strength, hypomobility, increased fascial restrictions, impaired flexibility, impaired UE functional use, improper body mechanics, postural dysfunction, and pain.   ACTIVITY LIMITATIONS carrying, lifting, and reach over head  PARTICIPATION LIMITATIONS: shopping, community activity, and occupation  PERSONAL FACTORS Time since onset of injury/illness/exacerbation are also affecting patient's functional outcome.   REHAB POTENTIAL: Good  CLINICAL DECISION MAKING: Evolving/moderate complexity  EVALUATION COMPLEXITY: Moderate   GOALS: Goals reviewed with patient? Yes  SHORT TERM GOALS: Target date: 02/02/2022  Will be compliant with HEP  Baseline: Goal status: Met  2.  Will have better understanding of postural mechanics and ergonomics  Baseline:  Goal status: Met  3.  Will have no more than  5/10 pain at worst in thoracic spine with no radicular symptoms  Baseline:  Goal status: Met    LONG TERM GOALS: Target date: 02/23/2022  Pain to be no more than 3/10 at worst with no radicular symptoms and no limitations in UE ROM with functional task performance  Baseline:  Goal status: 01/12/22 - Met, no pain   2.  Will be able to lift, push and pull 30# without increase in pain  Baseline:  Goal status: 01/12/22 - Met  3.  Will demonstrate significant improvement in thoracic PA mobility to help reduce pain  Baseline:  Goal status: Ongoing    PHYSICAL THERAPY DISCHARGE SUMMARY  Visits from Start of Care: 6  Patient agrees to discharge. Patient goals were partially met. Patient is being discharged due to being pleased with the current functional level.    PLAN: PT FREQUENCY: 1x/week  PT DURATION: 6 weeks  PLANNED INTERVENTIONS: Therapeutic exercises, Therapeutic activity, Neuromuscular re-education, Balance training, Gait training, Patient/Family education, Joint mobilization, Dry Needling, Electrical stimulation, Spinal mobilization, Cryotherapy, Moist heat, Taping, Traction, Ultrasound, Manual therapy, and Re-evaluation.  PLAN FOR NEXT SESSION: d/c today  Andris Baumann, DPT Mardy Lucier, SPTA 01/12/2022, 10:19 AM

## 2022-02-23 ENCOUNTER — Other Ambulatory Visit: Payer: Self-pay | Admitting: Internal Medicine

## 2022-03-07 ENCOUNTER — Encounter: Payer: Self-pay | Admitting: Family Medicine

## 2022-03-09 NOTE — Telephone Encounter (Signed)
Make an OV to come in and discuss this

## 2022-03-10 ENCOUNTER — Encounter (HOSPITAL_BASED_OUTPATIENT_CLINIC_OR_DEPARTMENT_OTHER): Payer: Self-pay

## 2022-03-10 ENCOUNTER — Emergency Department (HOSPITAL_BASED_OUTPATIENT_CLINIC_OR_DEPARTMENT_OTHER)
Admission: EM | Admit: 2022-03-10 | Discharge: 2022-03-10 | Disposition: A | Discharge status: Home / Self Care | Payer: 59 | Attending: Emergency Medicine | Admitting: Emergency Medicine

## 2022-03-10 DIAGNOSIS — Y92410 Unspecified street and highway as the place of occurrence of the external cause: Secondary | ICD-10-CM | POA: Diagnosis not present

## 2022-03-10 DIAGNOSIS — R42 Dizziness and giddiness: Secondary | ICD-10-CM | POA: Insufficient documentation

## 2022-03-10 DIAGNOSIS — M542 Cervicalgia: Secondary | ICD-10-CM | POA: Insufficient documentation

## 2022-03-10 DIAGNOSIS — R519 Headache, unspecified: Secondary | ICD-10-CM | POA: Diagnosis not present

## 2022-03-10 MED ORDER — NAPROXEN 375 MG PO TABS: 375.0000 mg | ORAL_TABLET | Freq: Two times a day (BID) | ORAL | 0 refills | Status: DC

## 2022-03-10 MED ORDER — CYCLOBENZAPRINE HCL 10 MG PO TABS: 10.0000 mg | ORAL_TABLET | Freq: Two times a day (BID) | ORAL | 0 refills | Status: DC | PRN
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
## 2022-03-10 NOTE — Discharge Instructions (Addendum)
Please refer to the attached instructions. Wear c-collar as discussed for comfort. Follow-up with your primary care provider if symptoms do not improve over the next several days.

## 2022-03-12 ENCOUNTER — Ambulatory Visit: Payer: 59 | Admitting: Family Medicine

## 2022-03-12 ENCOUNTER — Encounter: Payer: Self-pay | Admitting: Family Medicine

## 2022-03-12 VITALS — BP 124/82 | HR 75 | Temp 98.4°F | Wt 199.0 lb

## 2022-03-12 DIAGNOSIS — Z131 Encounter for screening for diabetes mellitus: Secondary | ICD-10-CM

## 2022-03-12 DIAGNOSIS — E669 Obesity, unspecified: Secondary | ICD-10-CM | POA: Diagnosis not present

## 2022-03-12 LAB — POCT GLYCOSYLATED HEMOGLOBIN (HGB A1C): Hemoglobin A1C: 5 % (ref 4.0–5.6)

## 2022-03-12 MED ORDER — WEGOVY 0.25 MG/0.5ML ~~LOC~~ SOAJ: 0.2500 mg | SUBCUTANEOUS | 0 refills | Status: DC

## 2022-03-12 NOTE — Progress Notes (Signed)
   Subjective:    Patient ID: Marcus Pruitt, male    DOB: 05-02-1971, 51 y.o.   MRN: [id number]  HPI Here asking to try [facility] for weight loss. He feels fine. His sister took this for 3 months and lost 12 lbs. His BMI today is 31.17 and his A1c is 5.0%.    Review of Systems  Constitutional: Negative.   Respiratory: Negative.    Cardiovascular: Negative.        Objective:   Physical Exam Constitutional:      Appearance: He is obese.  Cardiovascular:     Rate and Rhythm: Normal rate and regular rhythm.     Pulses: Normal pulses.     Heart sounds: Normal heart sounds.  Pulmonary:     Effort: Pulmonary effort is normal.     Breath sounds: Normal breath sounds.           Assessment & Plan:  Obesity.  He will try Wegovy 0.25 mg weekly for 4 weeks, then report back. Alysia Penna, MD

## 2022-03-13 ENCOUNTER — Encounter: Payer: Self-pay | Admitting: Family Medicine

## 2022-03-13 MED ORDER — SEMAGLUTIDE (1 MG/DOSE) 4 MG/3ML ~~LOC~~ SOPN: 1.0000 mg | PEN_INJECTOR | SUBCUTANEOUS | 2 refills | Status: DC

## 2022-03-13 NOTE — Telephone Encounter (Signed)
I sent in for Semaglutide (generic Ozempic) so let's see what happens at the pharmacy

## 2022-03-16 NOTE — Telephone Encounter (Signed)
Can you check on this please?

## 2022-03-19 NOTE — Telephone Encounter (Signed)
Pt PA for Ozempic was sent to plan on 03/19/22, Pt plan needs further information, awaiting pt insurance to fax over the form for completing

## 2022-03-22 ENCOUNTER — Encounter: Payer: Self-pay | Admitting: Internal Medicine

## 2022-04-13 NOTE — Telephone Encounter (Signed)
Pt plan has  denied coverage for Ozempic

## 2022-04-22 ENCOUNTER — Telehealth: Payer: Self-pay | Admitting: Family Medicine

## 2022-04-22 NOTE — Telephone Encounter (Signed)
Marcus Pruitt with CoverMyMeds [phone] Ref Key: [id number]  Re:  [employer] sent an error message back (via Fax) and is wondering if MD needs assistance?

## 2022-04-23 NOTE — Telephone Encounter (Addendum)
Attempted to resubmit PA.  Continual error message. Will retry.

## 2022-04-24 NOTE — Telephone Encounter (Signed)
Prior authorization completed for Ozempic, via phone through [employer]   information sent to clinical team will receive a decision in 72-96 hours via fax to office.

## 2022-05-14 ENCOUNTER — Other Ambulatory Visit: Payer: Self-pay | Admitting: Family Medicine

## 2022-05-25 ENCOUNTER — Ambulatory Visit: Payer: 59 | Admitting: Family Medicine

## 2022-05-25 ENCOUNTER — Encounter: Payer: Self-pay | Admitting: Family Medicine

## 2022-05-25 VITALS — BP 142/90 | HR 88 | Temp 98.3°F | Wt 208.0 lb

## 2022-05-25 DIAGNOSIS — L0291 Cutaneous abscess, unspecified: Secondary | ICD-10-CM

## 2022-05-25 MED ORDER — AMOXICILLIN-POT CLAVULANATE 500-125 MG PO TABS: 1.0000 | ORAL_TABLET | Freq: Two times a day (BID) | ORAL | 0 refills | Status: AC

## 2022-05-25 NOTE — Progress Notes (Signed)
Subjective:    Patient ID: Marcus Pruitt, male    DOB: 29-Jan-1971, 51 y.o.   MRN: [id number]  Chief Complaint  Patient presents with   Recurrent Skin Infections    Has been under arm for years but started oozing about a wk ago. Stated hurting, used some ointment on it, Ichtammol ointment for 2 days      HPI Patient was seen today for bump in L axilla.  Area present x 2 yrs, but started draining late last wk, Thurs or Friday.  Area has not been painful.  Pt used OTC Ichtammol ointment and tried squeezing the bump.  Denies fever, chills, n/v.  Past Medical History:  Diagnosis Date   Allergy    Blood transfusion without reported diagnosis    Clotting disorder ([place])    RESOLVED NOW PER PT   H/O blood clots    mesenteric   Hearing loss in right ear    History of colon polyps    Hypertension    IBS (irritable bowel syndrome)    with diarrhea   Low back pain    Prostate cancer ([place])    Sleep apnea    on CPAP   Small intestinal bacterial overgrowth 09/22/2017   + Breath test 08/2017   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   Tinnitus    RIGHT EAR    Allergies  Allergen Reactions   Bee Venom Anaphylaxis    SOB, , hand swelled up after getting stung on her hand    ROS General: Denies fever, chills, night sweats, changes in weight, changes in appetite HEENT: Denies headaches, ear pain, changes in vision, rhinorrhea, sore throat CV: Denies CP, palpitations, SOB, orthopnea Pulm: Denies SOB, cough, wheezing GI: Denies abdominal pain, nausea, vomiting, diarrhea, constipation GU: Denies dysuria, hematuria, frequency Msk: Denies muscle cramps, joint pains Neuro: Denies weakness, numbness, tingling Skin: Denies rashes, bruising +bump in L armpit Psych: Denies depression, anxiety, hallucinations      Objective:    Blood pressure (!) 142/90, pulse 88, temperature 98.3 F (36.8 C), temperature source Oral, weight 208 lb (94.3 kg), SpO2 98 %.   Gen. Pleasant,  well-nourished, in no distress, normal affect   HEENT: [place]/AT, face symmetric, conjunctiva clear, no scleral icterus, PERRLA, EOMI, nares patent without drainage Lungs: no accessory muscle use Cardiovascular: RRR, no peripheral edema Musculoskeletal: No deformities, no cyanosis or clubbing, normal tone Neuro:  A&Ox3, CN II-XII intact, normal gait Skin:  Warm, dry, intact.  Raised 1 cm x 2 cm fluctuant area in left axilla, TTP, with small amount of purulent drainage noted.   Wt Readings from Last 3 Encounters:  05/25/22 208 lb (94.3 kg)  03/12/22 199 lb (90.3 kg)  03/10/22 196 lb (88.9 kg)    Lab Results  Component Value Date   WBC 6.2 10/23/2021   HGB 15.3 10/23/2021   HCT 45.7 10/23/2021   PLT 249 10/23/2021   GLUCOSE 90 10/23/2021   CHOL 146 03/29/2020   TRIG 262 (H) 03/29/2020   HDL 44 03/29/2020   LDLDIRECT 118.0 03/11/2015   LDLCALC 68 03/29/2020   ALT 24 03/29/2020   AST 21 03/29/2020   NA 138 10/23/2021   K 4.1 10/23/2021   CL 105 10/23/2021   CREATININE 1.11 10/23/2021   BUN 16 10/23/2021   CO2 26 10/23/2021   TSH 1.26 03/29/2020   PSA <0.015 08/31/2016   INR 1.24 05/15/2017   HGBA1C 5.0 03/12/2022    Incision and Drainage Procedure Note  Pre-operative Diagnosis: Abscess  Post-operative Diagnosis: same  Indications: pt discomfort  Anesthesia: 1% plain lidocaine  Procedure Details  The procedure, risks and complications have been discussed in detail (including, but not limited to airway compromise, infection, bleeding) with the patient, and the patient has signed consent to the procedure.  The skin was sterilely prepped and draped over the affected area in the usual fashion. After adequate local anesthesia, I&D with a #11 blade was performed on the left axilla. Purulent drainage: present The patient was observed until stable.  Findings: Abscess with purulent drainage present  EBL: minimal cc's  Drains: None  Condition: Tolerated procedure well  and Stable   Complications: none.   Assessment/Plan:  Abscess - Plan: amoxicillin-clavulanate (AUGMENTIN) 500-125 MG tablet  Abscess noted in left axilla partially draining.  Consent obtained.  I&D performed to enlarge opening and promote drainage.  Patient to start Augmentin twice daily.  Given strict precautions and discussed S/S of infection.  F/u prn  Grier Mitts, MD

## 2022-05-25 NOTE — Patient Instructions (Signed)
A prescription for an antibiotic was sent to your local pharmacy.  You can use warm compresses on the area to help promote drainage.

## 2022-06-04 ENCOUNTER — Encounter: Payer: Self-pay | Admitting: Internal Medicine

## 2022-06-04 ENCOUNTER — Ambulatory Visit: Payer: 59 | Admitting: Internal Medicine

## 2022-06-04 VITALS — BP 151/102 | HR 79 | Ht 67.0 in | Wt 207.0 lb

## 2022-06-04 DIAGNOSIS — K589 Irritable bowel syndrome without diarrhea: Secondary | ICD-10-CM

## 2022-06-04 DIAGNOSIS — R143 Flatulence: Secondary | ICD-10-CM | POA: Diagnosis not present

## 2022-06-04 DIAGNOSIS — E739 Lactose intolerance, unspecified: Secondary | ICD-10-CM | POA: Diagnosis not present

## 2022-06-04 DIAGNOSIS — K529 Noninfective gastroenteritis and colitis, unspecified: Secondary | ICD-10-CM | POA: Diagnosis not present

## 2022-06-04 NOTE — Patient Instructions (Addendum)
As we discussed keep a food diary and try to avoid foods that bother you.  Try Lactaid for dairy products and give Beano a try to help with gas also.  Here are some websites to look at for dietitians.   SkinPromotion.no   http://www.anderson.info/  Remember to try to eat real foods - if there are words on the label you do not recognize of any fod or drink it is too processed and may make you feel bad.   Fecal calprotectin is the stool test we can consider and the diagnosis code that would be used is K52.9 - chronic diarrhea - if you want to ask insurance about coverage.   Keep me posted by My Chart  I appreciate the opportunity to care for you. Gatha Mayer, MD, [facility]

## 2022-06-04 NOTE — Progress Notes (Signed)
Marcus Pruitt 51 y.o. 1971/02/25 [id number]  Assessment & Plan:   Encounter Diagnoses  Name Primary?   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  We talked about the possibility of fecal calprotectin and I have given him the test name and supporting diagnosis to see if will be covered.  I do not think that is really necessary but we could consider it.  I think if he continues the cholestyramine and tries to understand diet issues and food sensitivities he will be okay.  Have recommended Lactaid and/or Beano to be used in the diet as well and to try to make better food choices when he can.  Acknowledge this is difficult with work requirements but trying to eat as many real foods as possible.  Had entertain the possibility of alpha gal as he finds things like hot dogs and pork and beef may be worse than chicken or other poultry but think that is not very likely as well and we will hold that in reserve.  He is going to work on these things and communicate with me through [place].  He does well with a 4 g daily dose of cholestyramine and tolerates that and will continue that.  He does think he probably had some problems with increased gastrocolic reflex prior to his SMV thrombosis but he did not really have diarrhea issues at that point.  The working diagnoses I have had has been some sort of small bowel damage related to that thrombosis.  CC: Laurey Morale, MD  Subjective:   Chief Complaint: Diarrhea and flatulence  HPI The patient is a 18 year old African-American man that I met in 2019 because of diarrhea problems that either began or intensified after a superior mesenteric vein thrombosis.  He was treated with cholestyramine with success.  He has had chronic diarrhea and borborygmi/flatulence issues.  Colonoscopy with random  biopsies, small bowel capsule endoscopy unrevealing.  He has had a positive SIBO test but did not respond to treatment.  Viberzi was unsuccessful.  Pancreatic enzyme replacement with Zenpep made him feel worse.  I have not really seen him for this since 2019.  He does relate a history of lactose intolerance over the years.  He reports that he has been mother-in-law once gave him Beano for Christmas or a birthday present and he thinks that has helped.  He is a little frustrated that he has to continue to take medication, taking cholestyramine once a day midday does control his diarrhea but he still has a lot of borborygmi and flatulence at times.  Most recently after having pizza last night.  At 1 point he went to [facility] and was on multiple small calorie meals a day that were plant-based with some poultry meats.  He thinks if he eats greasy foods he definitely has more symptoms.  He has pretty much given up soda except for rare ginger ale and does not really drink a lot of fruit juices.  He thinks when he eats more plant-based foods he does better.  He uses lactose-free milk.  At 1 point he had a referral to [facility] but never made it there for another opinion.  Additionally CT angiography and other cross-sectional imaging have been unrevealing. 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  12-hour shifts but sometimes may be longer. Allergies  Allergen Reactions   Bee Venom Anaphylaxis    SOB, , hand swelled up after getting stung on her hand   Current Meds  Medication Sig   amLODipine (NORVASC) 5 MG tablet TAKE 1 TABLET(5 MG) BY MOUTH DAILY   buPROPion (WELLBUTRIN XL) 150 MG 24 hr tablet Take 300 mg by mouth daily.   cholestyramine light (PREVALITE) 4 g packet  MIX AND DRINK 1 PACKET(4 GRAMS) BY MOUTH TWICE DAILY   cyclobenzaprine (FLEXERIL) 10 MG tablet Take 1 tablet (10 mg total) by mouth 2 (two) times daily as needed for muscle spasms.   cyclobenzaprine (FLEXERIL) 10 MG tablet Take 1 tablet (10 mg total) by mouth 2 (two) times daily as needed for muscle spasms.   EPINEPHrine (EPIPEN 2-PAK) 0.3 mg/0.3 mL IJ SOAJ injection Inject 0.3 mLs (0.3 mg total) into the muscle as needed for anaphylaxis.   gabapentin (NEURONTIN) 300 MG capsule Take 1 capsule (300 mg total) by mouth 3 (three) times daily as needed.   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 Carlean Purl, adenomatous polyp, repeat in 3 yrs    [place], URETEROSCOPY AND STENT PLACEMENT Bilateral 02/03/2019   Procedure: [place], URETEROSCOPY AND STENT PLACEMENT;  Surgeon: Alexis Frock, MD;  [facility];  Service: Urology;  Laterality: Bilateral;  [address] [place] STUDY N/A 08/26/2017   Procedure: GIVENS  CAPSULE STUDY;  Surgeon: Gatha Mayer, MD;  Location: [place];  Service: Endoscopy;  Laterality: N/A;   HOLMIUM LASER APPLICATION Bilateral 0/08/4740   Procedure: HOLMIUM LASER APPLICATION;  Surgeon: Alexis Frock, MD;  [facility];  Service: Urology;  Laterality: Bilateral;   INGUINAL HERNIA REPAIR Left 02/21/2016   Procedure: LAPAROSCOPIC INGUINAL HERNIA;  Surgeon: Alexis Frock, MD;  [facility];  Service: Urology;  Laterality: Left;   LYMPHADENECTOMY Bilateral 02/21/2016   Procedure: PELVIC LYMPHADENECTOMY;  Surgeon: Alexis Frock, MD;  [facility];  Service: Urology;  Laterality: Bilateral;   MANDIBLE SURGERY  1990   MESENTERIC ARTERY BYPASS N/A 05/15/2017   Procedure: OPEN FENESTRATION SUPERIOR MESENTERIC ARTERY;  Surgeon: Adele Barthel  L, MD;  Location: [place];  Service: Vascular;  Laterality: N/A;   PATCH ANGIOPLASTY N/A 05/15/2017   Procedure: SUPERIOR MESENTERIC ARTERY PATCH ANGIOPLASTY USING PERI-GUARD PATCH;  Surgeon: Conrad [place], MD;  Location: [place];  Service: Vascular;  Laterality: N/A;   PERCUTANEOUS VENOUS THROMBECTOMY,LYSIS WITH INTRAVASCULAR ULTRASOUND (IVUS) N/A 05/15/2017   Procedure: THROMBECTOMY OF SUPERIOR MESENTERIC ARTERY;  Surgeon: Conrad [place], MD;  Location: [place];  Service: Vascular;  Laterality: N/A;   [place] N/A 02/21/2016   Procedure: XI ROBOTIC ASSISTED LAPAROSCOPIC RADICAL PROSTATECTOMY;  Surgeon: Alexis Frock, MD;  [facility];  Service: Urology;  Laterality: N/A;   UMBILICAL HERNIA REPAIR  02/21/2016   Procedure: LAPAROSCOPIC UMBILICAL HERNIA;  Surgeon: Alexis Frock, MD;  [facility];  Service: Urology;;   [name]  07/29/2017   Procedure: Visceral Angiogram;  Surgeon: Conrad [place], MD;  Location: [place] [facility];  Service: Cardiovascular;;   Social History   Social History Narrative   The patient is divorced he has 1 son and 1 daughter He is a Scientist, 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   Review of Systems As above  Objective:   Physical Exam BP (!) 151/102   Pulse 79   Ht _0  (1.702 m)   Wt 207 lb (93.9 kg)   BMI 32.42 kg/m  35 minutes total time on this visit

## 2022-08-05 ENCOUNTER — Encounter: Payer: Self-pay | Admitting: Family Medicine

## 2022-08-05 ENCOUNTER — Encounter: Payer: Self-pay | Admitting: Internal Medicine

## 2022-08-05 NOTE — Telephone Encounter (Signed)
He can do this safely if he wishes

## 2022-08-26 ENCOUNTER — Encounter: Payer: Self-pay | Admitting: Family Medicine

## 2022-08-27 NOTE — Telephone Encounter (Signed)
No he should stay with taking only one at a time, but he can add Tylenol to it (up to 3000 mg in a day)

## 2022-08-28 ENCOUNTER — Other Ambulatory Visit: Payer: Self-pay

## 2022-08-28 MED ORDER — CELECOXIB 200 MG PO CAPS: 200.0000 mg | ORAL_CAPSULE | Freq: Two times a day (BID) | ORAL | 2 refills | Status: DC | PRN

## 2022-08-28 NOTE — Telephone Encounter (Signed)
Stop Naproxen and call in Celebrex 200 mg to take BID as needed for pain, #60 with 2 rf

## 2022-09-07 ENCOUNTER — Encounter: Payer: Self-pay | Admitting: Family Medicine

## 2022-09-08 MED ORDER — NALTREXONE-BUPROPION HCL ER 8-90 MG PO TB12: 2.0000 | ORAL_TABLET | Freq: Two times a day (BID) | ORAL | 5 refills | Status: DC

## 2022-09-08 NOTE — Telephone Encounter (Signed)
I sent in Contrave to try. This contains Bupropion, so tell him to STOP taking the Bupropion he had been taking

## 2022-09-09 ENCOUNTER — Encounter: Payer: Self-pay | Admitting: Family Medicine

## 2022-09-09 ENCOUNTER — Other Ambulatory Visit: Payer: Self-pay

## 2022-09-11 ENCOUNTER — Other Ambulatory Visit: Payer: Self-pay

## 2022-09-11 NOTE — Telephone Encounter (Signed)
Spoke with Pt plan verbally completed PA with agent PA number- [id number], agent state that the office will receive fax with PA determination

## 2022-09-17 NOTE — Telephone Encounter (Signed)
Pt called to inform MD that the Rx for Contrave was denied because MD answered No to the following question: (Something along the lines of) Will Pt be making lifestyle and diet and exercise change, along with this medication?  Pt states he has already started doing these things, and plans to continue while taking Contrave.  Pt is asking that MD please resubmit, answering yes to that question.  Please advise.

## 2022-09-18 ENCOUNTER — Other Ambulatory Visit: Payer: Self-pay

## 2022-09-18 NOTE — Telephone Encounter (Signed)
PA has been resubmitted to pt plan for approval Marcus Pruitt (Key: [facility])  form thumbnail Your information has been sent to [facility].

## 2022-09-24 NOTE — Telephone Encounter (Signed)
Pt PA for Contrave 8-90MG  er tablets Status: PA Response - ApprovedCreated: March 12th

## 2022-09-28 ENCOUNTER — Ambulatory Visit: Payer: 59 | Admitting: Physician Assistant

## 2022-09-28 ENCOUNTER — Encounter: Payer: Self-pay | Admitting: Physician Assistant

## 2022-09-28 VITALS — BP 124/80 | HR 90 | Temp 97.7°F | Ht 67.0 in | Wt 202.4 lb

## 2022-09-28 DIAGNOSIS — R051 Acute cough: Secondary | ICD-10-CM | POA: Diagnosis not present

## 2022-09-28 LAB — POC COVID19 BINAXNOW: SARS Coronavirus 2 Ag: NEGATIVE

## 2022-09-28 MED ORDER — AMOXICILLIN-POT CLAVULANATE 875-125 MG PO TABS: 1.0000 | ORAL_TABLET | Freq: Two times a day (BID) | ORAL | 0 refills | Status: DC

## 2022-09-28 MED ORDER — PREDNISONE 20 MG PO TABS: 40.0000 mg | ORAL_TABLET | Freq: Every day | ORAL | 0 refills | Status: DC

## 2022-09-28 NOTE — Patient Instructions (Signed)
It was great to see you!  Start oral prednisone for your symptoms May also take daily antihistamine such as allegra  If your symptoms worsen or do not improve, start the oral antibiotic if needed  Take care,  Inda Coke PA-C

## 2022-09-28 NOTE — Progress Notes (Signed)
Marcus Pruitt is a 52 y.o. male here for a new problem.  History of Present Illness:   Chief Complaint  Patient presents with   Cough    Pt in office c/o headache, cough, congestion X 2 days; pt just got back form [place], started feeling poorly, has bad allergies this time of year, really bad headache, pt not tested for Covid, no fatigue or other symptoms;     HPI  Cough: He complains of cough, nasal congestion, sore throat, sinus pressure, and headaches beginning 2 days ago.  He recently returned from [facility] on Sunday at 2 am and began feeling poorly afterwards.   His daughter has similar symptoms but notes they tend to both have allergies around this time of year.  He had a sore throat last night which resolved after taking Vapor Cool.  He reports having ear pain once while sneezing but states it lasted only a second.  He is also taking Sudafed PE and Allegra.  He usually uses a nasal cannula for sleep apnea but was unable to use it last night.  Denies chest pain, SOB, fever.  Past Medical History:  Diagnosis Date   Allergy    Blood transfusion without reported diagnosis    Clotting disorder    RESOLVED NOW PER PT   H/O blood clots    mesenteric   Hearing loss in right ear    History of colon polyps    Hypertension    IBS (irritable bowel syndrome)    with diarrhea   Low back pain    Prostate cancer    Sleep apnea    on CPAP   Small intestinal bacterial overgrowth 09/22/2017   + Breath test 08/2017   Superior mesenteric artery thrombosis 04/2017   Tinnitus    RIGHT EAR     Social History   Tobacco Use   Smoking status: Former    Types: Cigarettes    Quit date: 01/28/2007    Years since quitting: 15.6   Smokeless tobacco: Never  Vaping Use   Vaping Use: Never used  Substance Use Topics   Alcohol use: Yes    Alcohol/week: 0.0 standard drinks of alcohol    Comment: occasional BEER   Drug use: No    Past Surgical History:  Procedure Laterality Date   ABDOMINAL  WOUND DEHISCENCE N/A 05/20/2017   Procedure: REPAIR OF ABDOMINAL WOUND DEHISCENCE AND EVACUATION OF HEMATOMA;  Surgeon: Angelia Mould, MD;  Location: [place];  Service: Vascular;  Laterality: N/A;   AORTOGRAM N/A 05/15/2017   Procedure: MESENTERIC AORTOGRAM;  Surgeon: Conrad [place], MD;  Location: [place];  Service: Vascular;  Laterality: N/A;   COLONOSCOPY  10/14/2017   per Dr. Carlean Purl, adenomatous polyp, repeat in 3 yrs    [place], URETEROSCOPY AND STENT PLACEMENT Bilateral 02/03/2019   Procedure: [place], URETEROSCOPY AND STENT PLACEMENT;  Surgeon: Alexis Frock, MD;  [facility];  Service: Urology;  Laterality: Bilateral;  [address] [place] STUDY N/A 08/26/2017   Procedure: GIVENS CAPSULE STUDY;  Surgeon: Gatha Mayer, MD;  Location: [place];  Service: Endoscopy;  Laterality: N/A;   HOLMIUM LASER APPLICATION Bilateral [id number]   Procedure: HOLMIUM LASER APPLICATION;  Surgeon: Alexis Frock, MD;  [facility];  Service: Urology;  Laterality: Bilateral;   INGUINAL HERNIA REPAIR Left 02/21/2016   Procedure: LAPAROSCOPIC INGUINAL HERNIA;  Surgeon: Alexis Frock, MD;  [facility];  Service: Urology;  Laterality:  Left;   LYMPHADENECTOMY Bilateral 02/21/2016   Procedure: PELVIC LYMPHADENECTOMY;  Surgeon: Alexis Frock, MD;  [facility];  Service: Urology;  Laterality: Bilateral;   MANDIBLE SURGERY  1990   MESENTERIC ARTERY BYPASS N/A 05/15/2017   Procedure: OPEN FENESTRATION SUPERIOR MESENTERIC ARTERY;  Surgeon: Conrad [place], MD;  [facility];  Service: Vascular;  Laterality: N/A;   PATCH ANGIOPLASTY N/A 05/15/2017   Procedure: SUPERIOR MESENTERIC ARTERY PATCH ANGIOPLASTY USING PERI-GUARD PATCH;  Surgeon: Conrad [place], MD;  Location: [place];  Service: Vascular;  Laterality: N/A;   PERCUTANEOUS VENOUS THROMBECTOMY,LYSIS WITH INTRAVASCULAR ULTRASOUND (IVUS) N/A 05/15/2017   Procedure: THROMBECTOMY  OF SUPERIOR MESENTERIC ARTERY;  Surgeon: Conrad [employer], MD;  Location: [place];  Service: Vascular;  Laterality: N/A;   [place] N/A 02/21/2016   Procedure: XI ROBOTIC ASSISTED LAPAROSCOPIC RADICAL PROSTATECTOMY;  Surgeon: Alexis Frock, MD;  [facility];  Service: Urology;  Laterality: N/A;   UMBILICAL HERNIA REPAIR  02/21/2016   Procedure: LAPAROSCOPIC UMBILICAL HERNIA;  Surgeon: Alexis Frock, MD;  [facility];  Service: Urology;;   [name]  07/29/2017   Procedure: Visceral Angiogram;  Surgeon: Conrad , MD;  Location: [place] [facility];  Service: Cardiovascular;;    Family History  Problem Relation Age of Onset   Heart disease Maternal Grandmother    Diabetes Other        fhx   Prostate cancer Father    Hypertension Mother    Stroke Mother    Colon cancer Neg Hx    Colon polyps Neg Hx    Esophageal cancer Neg Hx    Rectal cancer Neg Hx    Stomach cancer Neg Hx     Allergies  Allergen Reactions   Bee Venom Anaphylaxis    SOB, , hand swelled up after getting stung on her hand    Current Medications:   Current Outpatient Medications:    amLODipine (NORVASC) 5 MG tablet, TAKE 1 TABLET(5 MG) BY MOUTH DAILY, Disp: 90 tablet, Rfl: 3   celecoxib (CELEBREX) 200 MG capsule, Take 1 capsule (200 mg total) by mouth 2 (two) times daily as needed., Disp: 60 capsule, Rfl: 2   cholestyramine light (PREVALITE) 4 g packet, MIX AND DRINK 1 PACKET(4 GRAMS) BY MOUTH TWICE DAILY, Disp: 60 each, Rfl: 3   cyclobenzaprine (FLEXERIL) 10 MG tablet, Take 1 tablet (10 mg total) by mouth 2 (two) times daily as needed for muscle spasms., Disp: 20 tablet, Rfl: 0   cyclobenzaprine (FLEXERIL) 10 MG tablet, Take 1 tablet (10 mg total) by mouth 2 (two) times daily as needed for muscle spasms., Disp: 20 tablet, Rfl: 0   EPINEPHrine (EPIPEN 2-PAK) 0.3 mg/0.3 mL IJ SOAJ injection, Inject 0.3 mLs (0.3 mg total) into the muscle as needed for  anaphylaxis., Disp: 1 each, Rfl: 2   gabapentin (NEURONTIN) 300 MG capsule, Take 1 capsule (300 mg total) by mouth 3 (three) times daily as needed., Disp: 90 capsule, Rfl: 3   Naltrexone-buPROPion HCl ER 8-90 MG TB12, Take 2 tablets by mouth in the morning and at bedtime., Disp: 120 tablet, Rfl: 5   amoxicillin-clavulanate (AUGMENTIN) 875-125 MG tablet, Take 1 tablet by mouth 2 (two) times daily., Disp: 14 tablet, Rfl: 0   predniSONE (DELTASONE) 20 MG tablet, Take 2 tablets (40 mg total) by mouth daily., Disp: 10 tablet, Rfl: 0   Review of Systems:   Review of Systems  HENT:  Positive for congestion (nasal) and  sore throat.        (+) sinus pressure  Respiratory:  Positive for cough and sputum production.   Neurological:  Positive for headaches.    Vitals:   Vitals:   09/28/22 1108  BP: 124/80  Pulse: 90  Temp: 97.7 F (36.5 C)  TempSrc: Temporal  SpO2: 97%  Weight: 202 lb 6.4 oz (91.8 kg)  Height: 5\' 7"  (1.702 m)     Body mass index is 31.7 kg/m.  Physical Exam:   Physical Exam Vitals and nursing note reviewed.  Constitutional:      General: He is not in acute distress.    Appearance: He is well-developed. He is not ill-appearing or toxic-appearing.  HENT:     Head: Normocephalic and atraumatic.     Right Ear: Tympanic membrane, ear canal and external ear normal. Tympanic membrane is not erythematous, retracted or bulging.     Left Ear: Tympanic membrane, ear canal and external ear normal. Tympanic membrane is not erythematous, retracted or bulging.     Nose: Nose normal.     Right Sinus: No maxillary sinus tenderness or frontal sinus tenderness.     Left Sinus: No maxillary sinus tenderness or frontal sinus tenderness.     Mouth/Throat:     Pharynx: Uvula midline. No posterior oropharyngeal erythema.  Eyes:     General: Lids are normal.     Conjunctiva/sclera: Conjunctivae normal.  Neck:     Trachea: Trachea normal.  Cardiovascular:     Rate and Rhythm: Normal  rate and regular rhythm.     Heart sounds: Normal heart sounds, S1 normal and S2 normal.  Pulmonary:     Effort: Pulmonary effort is normal.     Breath sounds: Normal breath sounds. No decreased breath sounds, wheezing, rhonchi or rales.  Lymphadenopathy:     Cervical: No cervical adenopathy.  Skin:    General: Skin is warm and dry.  Neurological:     Mental Status: He is alert.  Psychiatric:        Speech: Speech normal.        Behavior: Behavior normal. Behavior is cooperative.    Results for orders placed or performed in visit on 09/28/22  POC COVID-19 BinaxNow  Result Value Ref Range   SARS Coronavirus 2 Ag Negative Negative    Assessment and Plan:   Acute cough No red flags on exam.  Will initiate oral prednisone for suspected allergies; also safety net rx of augmentin given as he is about to travel again should sx not improve in next week. Discussed taking medications as prescribed. Reviewed return precautions including worsening fever, SOB, worsening cough or other concerns. Push fluids and rest. I recommend that patient follow-up if symptoms worsen or persist despite treatment x 7-10 days, sooner if needed.   I,Rachel Rivera,acting as a Education administrator for [employer], PA.,have documented all relevant documentation on the behalf of Inda Coke, PA,as directed by  Inda Coke, PA while in the presence of Inda Coke, [place].  I, Inda Coke, [place], have reviewed all documentation for this visit. The documentation on 09/28/22 for the exam, diagnosis, procedures, and orders are all accurate and complete.   Inda Coke, PA-C

## 2022-09-29 ENCOUNTER — Ambulatory Visit: Payer: 59 | Admitting: Family Medicine

## 2022-09-29 ENCOUNTER — Encounter: Payer: Self-pay | Admitting: Physician Assistant

## 2022-10-21 ENCOUNTER — Encounter: Payer: Self-pay | Admitting: Family Medicine

## 2022-10-21 NOTE — Telephone Encounter (Signed)
I don't think he will have any problems with this, but he should monitor his BP closely the first few weeks

## 2022-11-02 ENCOUNTER — Encounter: Payer: Self-pay | Admitting: Internal Medicine

## 2022-11-03 ENCOUNTER — Encounter: Payer: Self-pay | Admitting: Family Medicine

## 2022-11-04 ENCOUNTER — Ambulatory Visit: Payer: 59 | Admitting: Family Medicine

## 2022-11-06 ENCOUNTER — Encounter: Payer: Self-pay | Admitting: Family Medicine

## 2022-11-06 ENCOUNTER — Ambulatory Visit: Payer: 59 | Admitting: Family Medicine

## 2022-11-06 VITALS — BP 128/86 | HR 71 | Temp 98.0°F | Wt 209.0 lb

## 2022-11-06 DIAGNOSIS — M654 Radial styloid tenosynovitis [de Quervain]: Secondary | ICD-10-CM | POA: Diagnosis not present

## 2022-11-06 NOTE — Progress Notes (Signed)
   Subjective:    Patient ID: Marcus Pruitt, male    DOB: 09-07-1970, 52 y.o.   MRN: [id number]  HPI Here for 3 months of intermittent pain in the right wrist. No recent trauma, but his job requires computer work everyday. He has been wearing a brace that seems to help.    Review of Systems  Constitutional: Negative.   Respiratory: Negative.  Negative for apnea.   Cardiovascular: Negative.   Musculoskeletal:  Positive for arthralgias.       Objective:   Physical Exam Constitutional:      Appearance: Normal appearance.  Cardiovascular:     Rate and Rhythm: Normal rate and regular rhythm.     Pulses: Normal pulses.     Heart sounds: Normal heart sounds.  Pulmonary:     Effort: Pulmonary effort is normal.     Breath sounds: Normal breath sounds.  Musculoskeletal:     Comments: Right wrist appears normal. He is tender along the radial side of the wrist and the right thumb. No swelling   Neurological:     Mental Status: He is alert.           Assessment & Plan:  Marcus Pruitt tenosynovitis. I told him the most important thing for healing is rest, so he will use the right hand as little as possible for the next few weeks. He will take Celebrex 200 mg BID and apply ice. Recheck as needed. Marcus Crane, MD

## 2022-12-28 ENCOUNTER — Encounter: Payer: Self-pay | Admitting: Family Medicine

## 2023-02-24 ENCOUNTER — Ambulatory Visit: Payer: 59 | Admitting: Family Medicine

## 2023-03-06 ENCOUNTER — Other Ambulatory Visit: Payer: Self-pay | Admitting: Gastroenterology

## 2023-03-17 ENCOUNTER — Encounter: Payer: Self-pay | Admitting: Internal Medicine

## 2023-04-08 ENCOUNTER — Encounter: Payer: Self-pay | Admitting: Family Medicine

## 2023-04-14 MED ORDER — PHENTERMINE-TOPIRAMATE 15-92 MG PO CP24
1.0000 | ORAL_CAPSULE | Freq: Every morning | ORAL | 5 refills | Status: DC
Start: 2023-04-14 — End: 2023-09-22

## 2023-04-14 NOTE — Telephone Encounter (Signed)
I sent in Qsymia for him to try

## 2023-04-27 ENCOUNTER — Other Ambulatory Visit (HOSPITAL_COMMUNITY): Payer: Self-pay

## 2023-04-27 ENCOUNTER — Telehealth: Payer: Self-pay

## 2023-04-27 NOTE — Telephone Encounter (Signed)
Pharmacy Patient Advocate Encounter  Received notification from [facility] that Prior Authorization for Qsymia 15-92MG  er capsules  has been APPROVED from 04/27/2023 to 08/27/2023. Ran test claim, Copay is $102.68. This test claim was processed through [facility]- copay amounts may vary at other pharmacies due to pharmacy/plan contracts, or as the patient moves through the different stages of their insurance plan.

## 2023-04-27 NOTE — Telephone Encounter (Signed)
*  Primary  Pharmacy Patient Advocate Encounter   Received notification from CoverMyMeds that prior authorization for Qsymia 15-92MG  er capsules  is required/requested.   Insurance verification completed.   The patient is insured through [facility] .   Per test claim: PA required; PA started via CoverMyMeds. KEY [id number] . Waiting for clinical questions to populate.

## 2023-04-27 NOTE — Telephone Encounter (Signed)
PA request has been Submitted. New Encounter created for follow up. For additional info see Pharmacy Prior Auth telephone encounter from 10/29.

## 2023-04-28 NOTE — Telephone Encounter (Signed)
Noted  

## 2023-05-19 ENCOUNTER — Ambulatory Visit: Payer: 59 | Admitting: Family Medicine

## 2023-05-19 ENCOUNTER — Encounter: Payer: Self-pay | Admitting: Family Medicine

## 2023-05-19 VITALS — BP 120/80 | HR 76 | Temp 97.8°F | Wt 215.0 lb

## 2023-05-19 DIAGNOSIS — Z23 Encounter for immunization: Secondary | ICD-10-CM | POA: Diagnosis not present

## 2023-05-19 DIAGNOSIS — M79672 Pain in left foot: Secondary | ICD-10-CM | POA: Diagnosis not present

## 2023-05-19 NOTE — Progress Notes (Signed)
   Subjective:    Patient ID: Marcus Pruitt, male    DOB: 11-Aug-1970, 52 y.o.   MRN: [id number]  HPI Here for 2 months of pain in the top of the left foot. No hx of trauma. He wears inserts for pes planus. He has seen a podiatrist at the [place] several times for this. They have given him a Prednisone taper and he wears a compression sleeve, but these have not helped.    Review of Systems  Constitutional: Negative.   Respiratory: Negative.    Cardiovascular: Negative.   Musculoskeletal:  Positive for arthralgias.       Objective:   Physical Exam Constitutional:      Appearance: Normal appearance.  Cardiovascular:     Rate and Rhythm: Normal rate and regular rhythm.     Pulses: Normal pulses.     Heart sounds: Normal heart sounds.  Pulmonary:     Effort: Pulmonary effort is normal.     Breath sounds: Normal breath sounds.  Musculoskeletal:     Comments: Left ankle and foot are not swollen. No erythema or warmth. He is tender across the dorsal foot and anterior to the lateral malleolus. ROM is full   Neurological:     Mental Status: He is alert.           Assessment & Plan:  Foot pain. We will refer him to Orthopedics.  Gershon Crane, MD

## 2023-05-19 NOTE — Addendum Note (Signed)
Addended by: Carola Rhine on: 05/19/2023 11:18 AM   Modules accepted: Orders

## 2023-05-25 ENCOUNTER — Encounter: Payer: Self-pay | Admitting: Family Medicine

## 2023-05-25 NOTE — Telephone Encounter (Signed)
The best thing is Ibuprofen. He can take 800 mg up to TID if needed

## 2023-05-26 ENCOUNTER — Ambulatory Visit: Payer: 59 | Admitting: Family Medicine

## 2023-05-26 ENCOUNTER — Encounter: Payer: Self-pay | Admitting: Family Medicine

## 2023-05-26 VITALS — BP 134/90 | HR 83 | Temp 98.4°F | Wt 212.0 lb

## 2023-05-26 DIAGNOSIS — R509 Fever, unspecified: Secondary | ICD-10-CM

## 2023-05-26 DIAGNOSIS — R519 Headache, unspecified: Secondary | ICD-10-CM | POA: Diagnosis not present

## 2023-05-26 DIAGNOSIS — J40 Bronchitis, not specified as acute or chronic: Secondary | ICD-10-CM | POA: Diagnosis not present

## 2023-05-26 DIAGNOSIS — R52 Pain, unspecified: Secondary | ICD-10-CM

## 2023-05-26 DIAGNOSIS — R059 Cough, unspecified: Secondary | ICD-10-CM

## 2023-05-26 DIAGNOSIS — R6883 Chills (without fever): Secondary | ICD-10-CM | POA: Diagnosis not present

## 2023-05-26 LAB — POC COVID19 BINAXNOW: SARS Coronavirus 2 Ag: NEGATIVE

## 2023-05-26 LAB — POCT INFLUENZA A/B
Influenza A, POC: NEGATIVE
Influenza B, POC: NEGATIVE

## 2023-05-26 MED ORDER — AZITHROMYCIN 250 MG PO TABS: ORAL_TABLET | ORAL | 0 refills | Status: DC

## 2023-05-26 NOTE — Progress Notes (Signed)
   Subjective:    Patient ID: Marcus Pruitt, male    DOB: [birth year], 52 y.o.   MRN: [id number]  HPI Here for one week of fever to 103 degrees, head congestion, chest congestion, and coughing up green sputum. No SOB. On Mucinex.    Review of Systems  Constitutional:  Positive for fever.  HENT:  Positive for congestion. Negative for ear pain, postnasal drip, sinus pain and sore throat.   Eyes: Negative.   Respiratory:  Positive for cough and chest tightness. Negative for shortness of breath and wheezing.        Objective:   Physical Exam Constitutional:      Appearance: Normal appearance. He is not ill-appearing.  HENT:     Right Ear: Tympanic membrane, ear canal and external ear normal.     Left Ear: Tympanic membrane, ear canal and external ear normal.     Nose: Nose normal.     Mouth/Throat:     Pharynx: Oropharynx is clear.  Eyes:     Conjunctiva/sclera: Conjunctivae normal.  Pulmonary:     Effort: Pulmonary effort is normal.     Breath sounds: Rhonchi present. No wheezing or rales.  Lymphadenopathy:     Cervical: No cervical adenopathy.  Neurological:     Mental Status: He is alert.           Assessment & Plan:  Bronchitis, treat with a Zpack. Gershon Crane, MD

## 2023-06-03 ENCOUNTER — Other Ambulatory Visit (INDEPENDENT_AMBULATORY_CARE_PROVIDER_SITE_OTHER): Payer: 59

## 2023-06-03 ENCOUNTER — Ambulatory Visit: Payer: 59 | Admitting: Orthopedic Surgery

## 2023-06-03 DIAGNOSIS — M25572 Pain in left ankle and joints of left foot: Secondary | ICD-10-CM

## 2023-06-04 ENCOUNTER — Telehealth: Payer: Self-pay

## 2023-06-04 ENCOUNTER — Other Ambulatory Visit: Payer: Self-pay

## 2023-06-04 LAB — URIC ACID: Uric Acid, Serum: 6.6 mg/dL (ref 4.0–8.0)

## 2023-06-04 MED ORDER — COLCHICINE 0.6 MG PO TABS: 0.6000 mg | ORAL_TABLET | Freq: Every day | ORAL | 1 refills | Status: DC

## 2023-06-04 NOTE — Telephone Encounter (Signed)
I called pt and advised of lab result and also confirmed pharmacy and sent colchicine rx to pharmacy. Will call with any questions.

## 2023-06-04 NOTE — Telephone Encounter (Signed)
-----   Message from Nadara Mustard sent at 06/04/2023  6:45 AM EST ----- Call patient.  His uric acid is elevated. Uric acid is 6.6.  Have him start colchicine 0.6 mg daily.  Follow-up in 4 weeks. ----- Message ----- From: Janace Hoard Lab Results In Sent: 06/04/2023  12:42 AM EST To: Nadara Mustard, MD

## 2023-06-07 ENCOUNTER — Encounter: Payer: Self-pay | Admitting: Orthopedic Surgery

## 2023-06-07 DIAGNOSIS — M25572 Pain in left ankle and joints of left foot: Secondary | ICD-10-CM | POA: Diagnosis not present

## 2023-06-07 MED ORDER — LIDOCAINE HCL 1 % IJ SOLN
2.0000 mL | INTRAMUSCULAR | Status: AC | PRN
  Administered 2023-06-07: 2 mL

## 2023-06-07 MED ORDER — METHYLPREDNISOLONE ACETATE 40 MG/ML IJ SUSP
40.0000 mg | INTRAMUSCULAR | Status: AC | PRN
  Administered 2023-06-07: 40 mg via INTRA_ARTICULAR

## 2023-06-07 NOTE — Progress Notes (Signed)
Office Visit Note   Patient: Marcus Pruitt           Date of Birth: 29-May-1971           MRN: [id number] Visit Date: 06/03/2023              Requested by: Nelwyn Salisbury, MD [address] [place],  [place] [address] PCP: Nelwyn Salisbury, MD  Chief Complaint  Patient presents with   Left Ankle - Pain      HPI: Patient is a 52 year old gentleman who presents with left ankle anterior pain.  He states the symptoms have been going on for several months.  Patient states at the [facility] he was provided a brace and Voltaren gel.  Patient states he has to work on concrete floors with steel toe boots.  Family history positive for gout with his father.  Social history positive for eating shellfish.  Assessment & Plan: Visit Diagnoses:  1. Pain in left ankle and joints of left foot     Plan: Left ankle was injected a uric acid was obtained plan to reevaluate in 4 weeks.  Follow-Up Instructions: Return in about 4 weeks (around 07/01/2023).   Ortho Exam  Patient is alert, oriented, no adenopathy, well-dressed, normal affect, normal respiratory effort. Examination the Achilles tendon peroneal tendon and posterior tibial tendon are nontender to palpation.  Patient is directly tender to palpation anteriorly over the ankle.  He does have an effusion there is no redness or cellulitis.  Imaging: No results found. No images are attached to the encounter.  Labs: Lab Results  Component Value Date   HGBA1C 5.0 03/12/2022   CRP <0.8 07/29/2017   LABURIC 6.6 06/03/2023     Lab Results  Component Value Date   ALBUMIN 4.2 09/13/2019   ALBUMIN 4.4 01/14/2018   ALBUMIN 3.7 07/29/2017   PREALBUMIN 25.3 07/29/2017   PREALBUMIN 16.7 (L) 05/24/2017   PREALBUMIN 6.5 (L) 05/19/2017    Lab Results  Component Value Date   MG 2.1 05/25/2017   MG 2.1 05/24/2017   MG 2.1 05/22/2017   No results found for: "VD25OH"  Lab Results  Component Value Date   PREALBUMIN 25.3 07/29/2017    PREALBUMIN 16.7 (L) 05/24/2017   PREALBUMIN 6.5 (L) 05/19/2017      Latest Ref Rng & Units 10/23/2021    9:33 PM 03/29/2020   10:51 AM 09/13/2019    4:17 PM  CBC EXTENDED  WBC 4.0 - 10.5 K/uL 6.2  3.8  5.1   RBC 4.22 - 5.81 MIL/uL 5.40  5.35  4.99   Hemoglobin 13.0 - 17.0 g/dL 95.2  84.1  32.4   HCT 39.0 - 52.0 % 45.7  44.6  42.9   Platelets 150 - 400 K/uL 249  212  210.0   NEUT# 1.7 - 7.7 K/uL 4.1  1,904  2.5   Lymph# 0.7 - 4.0 K/uL 1.4  1,288  1.6      There is no height or weight on file to calculate BMI.  Orders:  Orders Placed This Encounter  Procedures   XR Ankle 2 Views Left   Uric acid   No orders of the defined types were placed in this encounter.    Procedures: Medium Joint Inj: L ankle on 06/07/2023 4:59 PM Indications: pain and diagnostic evaluation Details: 22 G 1.5 in needle, anteromedial approach Medications: 2 mL lidocaine 1 %; 40 mg methylPREDNISolone acetate 40 MG/ML Outcome: tolerated well, no immediate complications Procedure,  treatment alternatives, risks and benefits explained, specific risks discussed. Consent was given by the patient. Immediately prior to procedure a time out was called to verify the correct patient, procedure, equipment, support staff and site/side marked as required. Patient was prepped and draped in the usual sterile fashion.      Clinical Data: No additional findings.  ROS:  All other systems negative, except as noted in the HPI. Review of Systems  Objective: Vital Signs: There were no vitals taken for this visit.  Specialty Comments:  No specialty comments available.  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 07/22/2021   Hx of adenomatous polyp of colon 07/22/2021   Acquired cyst of kidney 03/31/2018   Class 1 obesity due to excess calories  without serious comorbidity with body mass index (BMI) of 30.0 to 30.9 in adult 03/31/2018   Male urinary stress incontinence 03/31/2018   Personal history of prostate cancer 03/31/2018   Peyronie's disease 03/31/2018   Urinary tract stones 03/31/2018   Low volume of ejaculated semen 03/06/2018   Small intestinal bacterial overgrowth 09/22/2017   Diarrhea    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   Superior mesenteric vein thrombosis (HCC) 07/16/2017   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/2017   Vascular disorder of small intestine (HCC) 05/15/2017   Dissection of unspecified artery (HCC) 05/15/2017   Blepharitis of eyelid of right eye 05/08/2014   HTN (hypertension) 03/30/2014   OBSTRUCTIVE SLEEP APNEA 08/04/2010   SNORING 07/21/2010   ERECTILE DYSFUNCTION, ORGANIC 06/13/2010   STRESS REACTION, ACUTE, WITH EMOTIONAL DISTURBANCE 04/18/2009   INSOMNIA 04/18/2009   Hearing loss 03/06/2009   Allergic rhinitis 03/06/2009   KNEE PAIN, BILATERAL 03/06/2009   LOW BACK PAIN 03/06/2009   Pes planus 03/06/2009   Past Medical History:  Diagnosis Date   Allergy    Blood transfusion without reported diagnosis    Clotting disorder (HCC)    RESOLVED NOW PER PT   H/O blood clots    mesenteric   Hearing loss in right ear    History of colon polyps    Hypertension    IBS (irritable bowel syndrome)    with diarrhea   Low back pain    Prostate cancer (HCC)    Sleep apnea    on CPAP   Small intestinal bacterial overgrowth 09/22/2017   + Breath test 08/2017   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   Tinnitus    RIGHT EAR    Family History  Problem Relation Age of Onset   Heart disease Maternal Grandmother    Diabetes Other        fhx   Prostate cancer Father    Hypertension Mother    Stroke Mother    Colon cancer Neg Hx    Colon polyps Neg Hx    Esophageal cancer Neg Hx    Rectal cancer Neg Hx    Stomach cancer Neg Hx     Past Surgical  History:  Procedure Laterality Date   ABDOMINAL WOUND DEHISCENCE N/A 05/20/2017   Procedure: REPAIR OF ABDOMINAL WOUND DEHISCENCE AND EVACUATION OF HEMATOMA;  Surgeon: Chuck Hint, MD;  Location: [facility] OR;  Service: Vascular;  Laterality: N/A;   AORTOGRAM N/A 05/15/2017   Procedure: MESENTERIC AORTOGRAM;  Surgeon: Fransisco Hertz, MD;  Location: [facility] OR;  Service: Vascular;  Laterality:  N/A;   COLONOSCOPY  10/14/2017   per Dr. Leone Payor, adenomatous polyp, repeat in 3 yrs    CYSTOSCOPY WITH RETROGRADE PYELOGRAM, URETEROSCOPY AND STENT PLACEMENT Bilateral 02/03/2019   Procedure: CYSTOSCOPY WITH RETROGRADE PYELOGRAM, URETEROSCOPY AND STENT PLACEMENT;  Surgeon: Sebastian Ache, MD;  Location: [facility] ORS;  Service: Urology;  Laterality: Bilateral;  75 MINS   GIVENS CAPSULE STUDY N/A 08/26/2017   Procedure: GIVENS CAPSULE STUDY;  Surgeon: Iva Boop, MD;  Location: [facility] ENDOSCOPY;  Service: Endoscopy;  Laterality: N/A;   HOLMIUM LASER APPLICATION Bilateral 02/03/2019   Procedure: HOLMIUM LASER APPLICATION;  Surgeon: Sebastian Ache, MD;  Location: [facility];  Service: Urology;  Laterality: Bilateral;   INGUINAL HERNIA REPAIR Left 02/21/2016   Procedure: LAPAROSCOPIC INGUINAL HERNIA;  Surgeon: Sebastian Ache, MD;  Location: [facility];  Service: Urology;  Laterality: Left;   LYMPHADENECTOMY Bilateral 02/21/2016   Procedure: PELVIC LYMPHADENECTOMY;  Surgeon: Sebastian Ache, MD;  Location: [facility];  Service: Urology;  Laterality: Bilateral;   MANDIBLE SURGERY  1990   MESENTERIC ARTERY BYPASS N/A 05/15/2017   Procedure: OPEN FENESTRATION SUPERIOR MESENTERIC ARTERY;  Surgeon: Fransisco Hertz, MD;  Location: [facility] OR;  Service: Vascular;  Laterality: N/A;   PATCH ANGIOPLASTY N/A 05/15/2017   Procedure: SUPERIOR MESENTERIC ARTERY PATCH ANGIOPLASTY USING PERI-GUARD PATCH;  Surgeon: Fransisco Hertz, MD;  Location: [facility] OR;  Service: Vascular;  Laterality: N/A;   PERCUTANEOUS VENOUS THROMBECTOMY,LYSIS WITH INTRAVASCULAR ULTRASOUND  (IVUS) N/A 05/15/2017   Procedure: THROMBECTOMY OF SUPERIOR MESENTERIC ARTERY;  Surgeon: Fransisco Hertz, MD;  Location: [facility] OR;  Service: Vascular;  Laterality: N/A;   ROBOT ASSISTED LAPAROSCOPIC RADICAL PROSTATECTOMY N/A 02/21/2016   Procedure: XI ROBOTIC ASSISTED LAPAROSCOPIC RADICAL PROSTATECTOMY;  Surgeon: Sebastian Ache, MD;  Location: [facility];  Service: Urology;  Laterality: N/A;   UMBILICAL HERNIA REPAIR  02/21/2016   Procedure: LAPAROSCOPIC UMBILICAL HERNIA;  Surgeon: Sebastian Ache, MD;  Location: [facility];  Service: Urology;;   Sherrie George  07/29/2017   Procedure: Visceral Angiogram;  Surgeon: Fransisco Hertz, MD;  Location: [facility] INVASIVE CV LAB;  Service: Cardiovascular;;   Social History   Occupational History   Occupation: Warehouse manager  Tobacco Use   Smoking status: Former    Current packs/day: 0.00    Types: Cigarettes    Quit date: 01/28/2007    Years since quitting: 16.3   Smokeless tobacco: Never  Vaping Use   Vaping status: Never Used  Substance and Sexual Activity   Alcohol use: Yes    Alcohol/week: 0.0 standard drinks of alcohol    Comment: occasional BEER   Drug use: No   Sexual activity: Yes

## 2023-06-08 ENCOUNTER — Telehealth: Payer: Self-pay | Admitting: Orthopedic Surgery

## 2023-06-08 ENCOUNTER — Encounter: Payer: Self-pay | Admitting: Orthopedic Surgery

## 2023-06-08 NOTE — Telephone Encounter (Signed)
Pt came in to pay $25 for [employer]

## 2023-06-26 ENCOUNTER — Other Ambulatory Visit: Payer: Self-pay | Admitting: Orthopedic Surgery

## 2023-07-01 ENCOUNTER — Ambulatory Visit: Payer: 59 | Admitting: Orthopedic Surgery

## 2023-07-20 ENCOUNTER — Ambulatory Visit: Payer: 59 | Admitting: Orthopedic Surgery

## 2023-07-20 DIAGNOSIS — M25572 Pain in left ankle and joints of left foot: Secondary | ICD-10-CM

## 2023-07-21 ENCOUNTER — Encounter: Payer: Self-pay | Admitting: Orthopedic Surgery

## 2023-07-21 NOTE — Progress Notes (Signed)
Office Visit Note   Patient: Marcus Pruitt           Date of Birth: [birth year]           MRN: [id number] Visit Date: 07/20/2023              Requested by: Nelwyn Salisbury, MD [address] [place],  [place] [address] PCP: Nelwyn Salisbury, MD  Chief Complaint  Patient presents with   Left Ankle - Follow-up    S/p injection 06/03/2023      HPI: Patient is a 53 year old gentleman who presents in follow-up for left ankle pain status post injection 2 months ago.  His uric acid was 6.6.  Patient states the injection helped well and that he was taking the colchicine as needed.  Assessment & Plan: Visit Diagnoses:  1. Pain in left ankle and joints of left foot     Plan: Recommended continue with the colchicine.  Patient was given instructions and demonstrated Achilles stretching.  Follow-Up Instructions: Return if symptoms worsen or fail to improve.   Ortho Exam  Patient is alert, oriented, no adenopathy, well-dressed, normal affect, normal respiratory effort. Examination patient has equinus contracture with dorsiflexion 10 degrees short of neutral with his knee extended.  The ankle has pain-free range of motion.  Imaging: No results found. No images are attached to the encounter.  Labs: Lab Results  Component Value Date   HGBA1C 5.0 03/12/2022   CRP <0.8 07/29/2017   LABURIC 6.6 06/03/2023     Lab Results  Component Value Date   ALBUMIN 4.2 09/13/2019   ALBUMIN 4.4 01/14/2018   ALBUMIN 3.7 07/29/2017   PREALBUMIN 25.3 07/29/2017   PREALBUMIN 16.7 (L) 05/24/2017   PREALBUMIN 6.5 (L) 05/19/2017    Lab Results  Component Value Date   MG 2.1 05/25/2017   MG 2.1 05/24/2017   MG 2.1 05/22/2017   No results found for: "VD25OH"  Lab Results  Component Value Date   PREALBUMIN 25.3 07/29/2017   PREALBUMIN 16.7 (L) 05/24/2017   PREALBUMIN 6.5 (L) 05/19/2017      Latest Ref Rng & Units 10/23/2021    9:33 PM 03/29/2020   10:51 AM 09/13/2019    4:17 PM   CBC EXTENDED  WBC 4.0 - 10.5 K/uL 6.2  3.8  5.1   RBC 4.22 - 5.81 MIL/uL 5.40  5.35  4.99   Hemoglobin 13.0 - 17.0 g/dL 62.1  30.8  65.7   HCT 39.0 - 52.0 % 45.7  44.6  42.9   Platelets 150 - 400 K/uL 249  212  210.0   NEUT# 1.7 - 7.7 K/uL 4.1  1,904  2.5   Lymph# 0.7 - 4.0 K/uL 1.4  1,288  1.6      There is no height or weight on file to calculate BMI.  Orders:  No orders of the defined types were placed in this encounter.  No orders of the defined types were placed in this encounter.    Procedures: No procedures performed  Clinical Data: No additional findings.  ROS:  All other systems negative, except as noted in the HPI. Review of Systems  Objective: Vital Signs: There were no vitals taken for this visit.  Specialty Comments:  No specialty comments available.  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 07/22/2021  Hx of adenomatous polyp of colon 07/22/2021   Acquired cyst of kidney 03/31/2018   Class 1 obesity due to excess calories without serious comorbidity with body mass index (BMI) of 30.0 to 30.9 in adult 03/31/2018   Male urinary stress incontinence 03/31/2018   Personal history of prostate cancer 03/31/2018   Peyronie's disease 03/31/2018   Urinary tract stones 03/31/2018   Low volume of ejaculated semen 03/06/2018   Small intestinal bacterial overgrowth 09/22/2017   Diarrhea    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   Superior mesenteric vein thrombosis (HCC) 07/16/2017   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/2017   Vascular disorder of small intestine (HCC) 05/15/2017   Dissection of unspecified artery (HCC) 05/15/2017   Blepharitis of eyelid of right eye 05/08/2014   HTN (hypertension) 03/30/2014   OBSTRUCTIVE SLEEP APNEA 08/04/2010   SNORING  07/21/2010   ERECTILE DYSFUNCTION, ORGANIC 06/13/2010   STRESS REACTION, ACUTE, WITH EMOTIONAL DISTURBANCE 04/18/2009   INSOMNIA 04/18/2009   Hearing loss 03/06/2009   Allergic rhinitis 03/06/2009   KNEE PAIN, BILATERAL 03/06/2009   LOW BACK PAIN 03/06/2009   Pes planus 03/06/2009   Past Medical History:  Diagnosis Date   Allergy    Blood transfusion without reported diagnosis    Clotting disorder (HCC)    RESOLVED NOW PER PT   H/O blood clots    mesenteric   Hearing loss in right ear    History of colon polyps    Hypertension    IBS (irritable bowel syndrome)    with diarrhea   Low back pain    Prostate cancer (HCC)    Sleep apnea    on CPAP   Small intestinal bacterial overgrowth 09/22/2017   + Breath test 08/2017   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   Tinnitus    RIGHT EAR    Family History  Problem Relation Age of Onset   Heart disease Maternal Grandmother    Diabetes Other        fhx   Prostate cancer Father    Hypertension Mother    Stroke Mother    Colon cancer Neg Hx    Colon polyps Neg Hx    Esophageal cancer Neg Hx    Rectal cancer Neg Hx    Stomach cancer Neg Hx     Past Surgical History:  Procedure Laterality Date   ABDOMINAL WOUND DEHISCENCE N/A 05/20/2017   Procedure: REPAIR OF ABDOMINAL WOUND DEHISCENCE AND EVACUATION OF HEMATOMA;  Surgeon: Chuck Hint, MD;  Location: [facility] OR;  Service: Vascular;  Laterality: N/A;   AORTOGRAM N/A 05/15/2017   Procedure: MESENTERIC AORTOGRAM;  Surgeon: Fransisco Hertz, MD;  Location: [facility] OR;  Service: Vascular;  Laterality: N/A;   COLONOSCOPY  10/14/2017   per Dr. Leone Payor, adenomatous polyp, repeat in 3 yrs    CYSTOSCOPY WITH RETROGRADE PYELOGRAM, URETEROSCOPY AND STENT PLACEMENT Bilateral 02/03/2019   Procedure: CYSTOSCOPY WITH RETROGRADE PYELOGRAM, URETEROSCOPY AND STENT PLACEMENT;  Surgeon: Sebastian Ache, MD;  Location: [facility];  Service: Urology;  Laterality: Bilateral;  75 MINS   GIVENS  CAPSULE STUDY N/A 08/26/2017   Procedure: GIVENS CAPSULE STUDY;  Surgeon: Iva Boop, MD;  Location: [facility] ENDOSCOPY;  Service: Endoscopy;  Laterality: N/A;   HOLMIUM LASER APPLICATION Bilateral 02/03/2019   Procedure: HOLMIUM LASER APPLICATION;  Surgeon: Sebastian Ache, MD;  Location: [facility];  Service: Urology;  Laterality: Bilateral;   INGUINAL HERNIA REPAIR Left 02/21/2016   Procedure: LAPAROSCOPIC INGUINAL HERNIA;  Surgeon: Normand Sloop  Berneice Heinrich, MD;  Location: [facility];  Service: Urology;  Laterality: Left;   LYMPHADENECTOMY Bilateral 02/21/2016   Procedure: PELVIC LYMPHADENECTOMY;  Surgeon: Sebastian Ache, MD;  Location: [facility];  Service: Urology;  Laterality: Bilateral;   MANDIBLE SURGERY  1990   MESENTERIC ARTERY BYPASS N/A 05/15/2017   Procedure: OPEN FENESTRATION SUPERIOR MESENTERIC ARTERY;  Surgeon: Fransisco Hertz, MD;  Location: [facility] OR;  Service: Vascular;  Laterality: N/A;   PATCH ANGIOPLASTY N/A 05/15/2017   Procedure: SUPERIOR MESENTERIC ARTERY PATCH ANGIOPLASTY USING PERI-GUARD PATCH;  Surgeon: Fransisco Hertz, MD;  Location: [facility] OR;  Service: Vascular;  Laterality: N/A;   PERCUTANEOUS VENOUS THROMBECTOMY,LYSIS WITH INTRAVASCULAR ULTRASOUND (IVUS) N/A 05/15/2017   Procedure: THROMBECTOMY OF SUPERIOR MESENTERIC ARTERY;  Surgeon: Fransisco Hertz, MD;  Location: [facility] OR;  Service: Vascular;  Laterality: N/A;   ROBOT ASSISTED LAPAROSCOPIC RADICAL PROSTATECTOMY N/A 02/21/2016   Procedure: XI ROBOTIC ASSISTED LAPAROSCOPIC RADICAL PROSTATECTOMY;  Surgeon: Sebastian Ache, MD;  Location: [facility];  Service: Urology;  Laterality: N/A;   UMBILICAL HERNIA REPAIR  02/21/2016   Procedure: LAPAROSCOPIC UMBILICAL HERNIA;  Surgeon: Sebastian Ache, MD;  Location: [facility];  Service: Urology;;   Sherrie George  07/29/2017   Procedure: Visceral Angiogram;  Surgeon: Fransisco Hertz, MD;  Location: [facility] INVASIVE CV LAB;  Service: Cardiovascular;;   Social History   Occupational History   Occupation: Warehouse manager  Tobacco Use    Smoking status: Former    Current packs/day: 0.00    Types: Cigarettes    Quit date: 01/28/2007    Years since quitting: 16.4   Smokeless tobacco: Never  Vaping Use   Vaping status: Never Used  Substance and Sexual Activity   Alcohol use: Yes    Alcohol/week: 0.0 standard drinks of alcohol    Comment: occasional BEER   Drug use: No   Sexual activity: Yes

## 2023-07-28 ENCOUNTER — Other Ambulatory Visit: Payer: Self-pay | Admitting: Family Medicine

## 2023-07-28 ENCOUNTER — Ambulatory Visit: Payer: 59

## 2023-08-29 ENCOUNTER — Encounter: Payer: Self-pay | Admitting: Orthopedic Surgery

## 2023-09-03 ENCOUNTER — Other Ambulatory Visit: Payer: Self-pay | Admitting: Family

## 2023-09-03 ENCOUNTER — Ambulatory Visit (INDEPENDENT_AMBULATORY_CARE_PROVIDER_SITE_OTHER): Admitting: Family

## 2023-09-03 ENCOUNTER — Encounter: Payer: Self-pay | Admitting: Family

## 2023-09-03 DIAGNOSIS — M1A00X0 Idiopathic chronic gout, unspecified site, without tophus (tophi): Secondary | ICD-10-CM

## 2023-09-03 DIAGNOSIS — M25572 Pain in left ankle and joints of left foot: Secondary | ICD-10-CM

## 2023-09-03 MED ORDER — ALLOPURINOL 100 MG PO TABS
100.0000 mg | ORAL_TABLET | Freq: Two times a day (BID) | ORAL | 3 refills | Status: DC
Start: 2023-09-03 — End: 2023-09-06

## 2023-09-08 NOTE — Progress Notes (Signed)
 Office Visit Note   Patient: Marcus Pruitt           Date of Birth: 05/23/1971           MRN: [id number] Visit Date: 09/03/2023              Requested by: Nelwyn Salisbury, MD [address] [place],  [place] [address] PCP: Nelwyn Salisbury, MD  Chief Complaint  Patient presents with   Left Ankle - Pain      HPI: The patient is a 53 year old gentleman who seen today for evaluation of left ankle pain.  He has had return of his pain especially over the anterior lateral ankle.  He is on his feet for work he did have a Depo-Medrol injection of the ankle December of last year when his pain initially came on at that time his uric acid was checked and was found to be 6.6.  He was taking colchicine daily for about a month  After his visit in December he had near full resolution of his pain these symptoms have gradually returned his primary care had recommended he only use the colchicine for flares  Assessment & Plan: Visit Diagnoses: No diagnosis found.  Plan: Will place him on allopurinol for gout.  Discussed use of colchicine for flares.  Will follow-up in the office in 4 weeks  The patient will continue with heel cord stretching as demonstrated at last visit supportive shoe wear reinforced.  Follow-Up Instructions: Return in about 4 weeks (around 10/01/2023), or if symptoms worsen or fail to improve.   Ortho Exam  Patient is alert, oriented, no adenopathy, well-dressed, normal affect, normal respiratory effort. On examination left lower extremity the patient has heel cord tightness with dorsiflexion just shy of neutral with his knee extended.  The ankle has painless range of motion does have mild tenderness palpation of the anterior ankle joint as well as subtalar joint.  Imaging: No results found. No images are attached to the encounter.  Labs: Lab Results  Component Value Date   HGBA1C 5.0 03/12/2022   CRP <0.8 07/29/2017   LABURIC 6.6 06/03/2023     Lab Results   Component Value Date   ALBUMIN 4.2 09/13/2019   ALBUMIN 4.4 01/14/2018   ALBUMIN 3.7 07/29/2017   PREALBUMIN 25.3 07/29/2017   PREALBUMIN 16.7 (L) 05/24/2017   PREALBUMIN 6.5 (L) 05/19/2017    Lab Results  Component Value Date   MG 2.1 05/25/2017   MG 2.1 05/24/2017   MG 2.1 05/22/2017   No results found for: "VD25OH"  Lab Results  Component Value Date   PREALBUMIN 25.3 07/29/2017   PREALBUMIN 16.7 (L) 05/24/2017   PREALBUMIN 6.5 (L) 05/19/2017      Latest Ref Rng & Units 10/23/2021    9:33 PM 03/29/2020   10:51 AM 09/13/2019    4:17 PM  CBC EXTENDED  WBC 4.0 - 10.5 K/uL 6.2  3.8  5.1   RBC 4.22 - 5.81 MIL/uL 5.40  5.35  4.99   Hemoglobin 13.0 - 17.0 g/dL 19.1  47.8  29.5   HCT 39.0 - 52.0 % 45.7  44.6  42.9   Platelets 150 - 400 K/uL 249  212  210.0   NEUT# 1.7 - 7.7 K/uL 4.1  1,904  2.5   Lymph# 0.7 - 4.0 K/uL 1.4  1,288  1.6      There is no height or weight on file to calculate BMI.  Orders:  No orders of  the defined types were placed in this encounter.  Meds ordered this encounter  Medications   DISCONTD: allopurinol (ZYLOPRIM) 100 MG tablet    Sig: Take 1 tablet (100 mg total) by mouth 2 (two) times daily.    Dispense:  60 tablet    Refill:  3     Procedures: No procedures performed  Clinical Data: No additional findings.  ROS:  All other systems negative, except as noted in the HPI. Review of Systems  Objective: Vital Signs: There were no vitals taken for this visit.  Specialty Comments:  No specialty comments available.  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 07/22/2021   Hx of adenomatous polyp of colon 07/22/2021   Acquired cyst of kidney 03/31/2018   Class 1 obesity due to excess calories without serious comorbidity with body mass index (BMI) of 30.0 to 30.9 in  adult 03/31/2018   Male urinary stress incontinence 03/31/2018   Personal history of prostate cancer 03/31/2018   Peyronie's disease 03/31/2018   Urinary tract stones 03/31/2018   Low volume of ejaculated semen 03/06/2018   Small intestinal bacterial overgrowth 09/22/2017   Diarrhea    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   Superior mesenteric vein thrombosis (HCC) 07/16/2017   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/2017   Vascular disorder of small intestine (HCC) 05/15/2017   Dissection of unspecified artery (HCC) 05/15/2017   Blepharitis of eyelid of right eye 05/08/2014   HTN (hypertension) 03/30/2014   OBSTRUCTIVE SLEEP APNEA 08/04/2010   SNORING 07/21/2010   ERECTILE DYSFUNCTION, ORGANIC 06/13/2010   STRESS REACTION, ACUTE, WITH EMOTIONAL DISTURBANCE 04/18/2009   INSOMNIA 04/18/2009   Hearing loss 03/06/2009   Allergic rhinitis 03/06/2009   KNEE PAIN, BILATERAL 03/06/2009   LOW BACK PAIN 03/06/2009   Pes planus 03/06/2009   Past Medical History:  Diagnosis Date   Allergy    Blood transfusion without reported diagnosis    Clotting disorder (HCC)    RESOLVED NOW PER PT   H/O blood clots    mesenteric   Hearing loss in right ear    History of colon polyps    Hypertension    IBS (irritable bowel syndrome)    with diarrhea   Low back pain    Prostate cancer (HCC)    Sleep apnea    on CPAP   Small intestinal bacterial overgrowth 09/22/2017   + Breath test 08/2017   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   Tinnitus    RIGHT EAR    Family History  Problem Relation Age of Onset   Heart disease Maternal Grandmother    Diabetes Other        fhx   Prostate cancer Father    Hypertension Mother    Stroke Mother    Colon cancer Neg Hx    Colon polyps Neg Hx    Esophageal cancer Neg Hx    Rectal cancer Neg Hx    Stomach cancer Neg Hx     Past Surgical History:  Procedure Laterality Date   ABDOMINAL WOUND DEHISCENCE N/A  05/20/2017   Procedure: REPAIR OF ABDOMINAL WOUND DEHISCENCE AND EVACUATION OF HEMATOMA;  Surgeon: Chuck Hint, MD;  Location: [facility] OR;  Service: Vascular;  Laterality: N/A;   AORTOGRAM N/A 05/15/2017   Procedure: MESENTERIC AORTOGRAM;  Surgeon: Fransisco Hertz, MD;  Location: [facility] OR;  Service: Vascular;  Laterality: N/A;   COLONOSCOPY  10/14/2017   per Dr. Leone Payor, adenomatous polyp, repeat in 3 yrs    CYSTOSCOPY WITH RETROGRADE PYELOGRAM, URETEROSCOPY AND STENT PLACEMENT Bilateral 02/03/2019   Procedure: CYSTOSCOPY WITH RETROGRADE PYELOGRAM, URETEROSCOPY AND STENT PLACEMENT;  Surgeon: Sebastian Ache, MD;  Location: [facility] ORS;  Service: Urology;  Laterality: Bilateral;  75 MINS   GIVENS CAPSULE STUDY N/A 08/26/2017   Procedure: GIVENS CAPSULE STUDY;  Surgeon: Iva Boop, MD;  Location: [facility] ENDOSCOPY;  Service: Endoscopy;  Laterality: N/A;   HOLMIUM LASER APPLICATION Bilateral 02/03/2019   Procedure: HOLMIUM LASER APPLICATION;  Surgeon: Sebastian Ache, MD;  Location: [facility] ORS;  Service: Urology;  Laterality: Bilateral;   INGUINAL HERNIA REPAIR Left 02/21/2016   Procedure: LAPAROSCOPIC INGUINAL HERNIA;  Surgeon: Sebastian Ache, MD;  Location: [facility] ORS;  Service: Urology;  Laterality: Left;   LYMPHADENECTOMY Bilateral 02/21/2016   Procedure: PELVIC LYMPHADENECTOMY;  Surgeon: Sebastian Ache, MD;  Location: [facility] ORS;  Service: Urology;  Laterality: Bilateral;   MANDIBLE SURGERY  1990   MESENTERIC ARTERY BYPASS N/A 05/15/2017   Procedure: OPEN FENESTRATION SUPERIOR MESENTERIC ARTERY;  Surgeon: Fransisco Hertz, MD;  Location: [facility] OR;  Service: Vascular;  Laterality: N/A;   PATCH ANGIOPLASTY N/A 05/15/2017   Procedure: SUPERIOR MESENTERIC ARTERY PATCH ANGIOPLASTY USING PERI-GUARD PATCH;  Surgeon: Fransisco Hertz, MD;  Location: [facility] OR;  Service: Vascular;  Laterality: N/A;   PERCUTANEOUS VENOUS THROMBECTOMY,LYSIS WITH INTRAVASCULAR ULTRASOUND (IVUS) N/A 05/15/2017   Procedure: THROMBECTOMY OF SUPERIOR  MESENTERIC ARTERY;  Surgeon: Fransisco Hertz, MD;  Location: [facility] OR;  Service: Vascular;  Laterality: N/A;   ROBOT ASSISTED LAPAROSCOPIC RADICAL PROSTATECTOMY N/A 02/21/2016   Procedure: XI ROBOTIC ASSISTED LAPAROSCOPIC RADICAL PROSTATECTOMY;  Surgeon: Sebastian Ache, MD;  Location: [facility] ORS;  Service: Urology;  Laterality: N/A;   UMBILICAL HERNIA REPAIR  02/21/2016   Procedure: LAPAROSCOPIC UMBILICAL HERNIA;  Surgeon: Sebastian Ache, MD;  Location: [facility] ORS;  Service: Urology;;   Sherrie George  07/29/2017   Procedure: Visceral Angiogram;  Surgeon: Fransisco Hertz, MD;  Location: [facility] INVASIVE CV LAB;  Service: Cardiovascular;;   Social History   Occupational History   Occupation: Warehouse manager  Tobacco Use   Smoking status: Former    Current packs/day: 0.00    Types: Cigarettes    Quit date: 01/28/2007    Years since quitting: 16.6   Smokeless tobacco: Never  Vaping Use   Vaping status: Never Used  Substance and Sexual Activity   Alcohol use: Yes    Alcohol/week: 0.0 standard drinks of alcohol    Comment: occasional BEER   Drug use: No   Sexual activity: Yes

## 2023-09-14 ENCOUNTER — Other Ambulatory Visit: Payer: Self-pay | Admitting: Urology

## 2023-09-16 ENCOUNTER — Encounter (HOSPITAL_COMMUNITY): Payer: Self-pay | Admitting: Urology

## 2023-09-16 ENCOUNTER — Other Ambulatory Visit: Payer: Self-pay

## 2023-09-16 NOTE — Patient Instructions (Signed)
 SURGICAL WAITING ROOM VISITATION Patients having surgery or a procedure may have no more than 2 support people in the waiting area - these visitors may rotate.    Children under the age of 102 must have an adult with them who is not the patient.  Due to an increase in RSV and influenza rates and associated hospitalizations, children ages 52 and under may not visit patients in [facility] hospitals.   If the patient needs to stay at the hospital during part of their recovery, the visitor guidelines for inpatient rooms apply. Pre-op nurse will coordinate an appropriate time for 1 support person to accompany patient in pre-op.  This support person may not rotate.    Please refer to the [facility] website for the visitor guidelines for Inpatients (after your surgery is over and you are in a regular room).       Your procedure is scheduled on:  09-22-23   Report to [facility] Main Entrance    Report to admitting at 1:15 PM   Call this number if you have problems the morning of surgery [phone]   Do not eat food or drink liquids :After Midnight.          If you have questions, please contact your surgeon's office.   FOLLOW ANY ADDITIONAL PRE OP INSTRUCTIONS YOU RECEIVED FROM YOUR SURGEON'S OFFICE!!!     Oral Hygiene is also important to reduce your risk of infection.                                    Remember - BRUSH YOUR TEETH THE MORNING OF SURGERY WITH YOUR REGULAR TOOTHPASTE   Do NOT smoke after Midnight   Take these medicines the morning of surgery with A SIP OF WATER:    Allopurinol   Amlodipine   Colchicine if needed  Stop all vitamins and herbal supplements 7 days before surgery  Bring CPAP mask and tubing day of surgery.                              You may not have any metal on your body including jewelry, and body piercing             Do not wear lotions, powders, cologne, or deodorant              Men may shave face and neck.   Do not bring  valuables to the hospital. [facility] IS NOT RESPONSIBLE   FOR VALUABLES.   Contacts, dentures or bridgework may not be worn into surgery.  DO NOT BRING YOUR HOME MEDICATIONS TO THE HOSPITAL. PHARMACY WILL DISPENSE MEDICATIONS LISTED ON YOUR MEDICATION LIST TO YOU DURING YOUR ADMISSION IN THE HOSPITAL!    Patients discharged on the day of surgery will not be allowed to drive home.  Someone NEEDS to stay with you for the first 24 hours after anesthesia.              Please read over the following fact sheets you were given: IF YOU HAVE QUESTIONS ABOUT YOUR PRE-OP INSTRUCTIONS PLEASE CALL [phone] [facility]  If you received a COVID test during your pre-op visit  it is requested that you wear a mask when out in public, stay away from anyone that may not be feeling well and notify your surgeon if you develop symptoms. If you  test positive for Covid or have been in contact with anyone that has tested positive in the last 10 days please notify you surgeon.  [facility] - Preparing for Surgery Before surgery, you can play an important role.  Because skin is not sterile, your skin needs to be as free of germs as possible.  You can reduce the number of germs on your skin by washing with CHG (chlorahexidine gluconate) soap before surgery.  CHG is an antiseptic cleaner which kills germs and bonds with the skin to continue killing germs even after washing. Please DO NOT use if you have an allergy to CHG or antibacterial soaps.  If your skin becomes reddened/irritated stop using the CHG and inform your nurse when you arrive at Short Stay. Do not shave (including legs and underarms) for at least 48 hours prior to the first CHG shower.  You may shave your face/neck.  Please follow these instructions carefully:  1.  Shower with CHG Soap the night before surgery and the  morning of surgery.  2.  If you choose to wash your hair, wash your hair first as usual with your normal  shampoo.  3.  After you shampoo,  rinse your hair and body thoroughly to remove the shampoo.                             4.  Use CHG as you would any other liquid soap.  You can apply chg directly to the skin and wash.  Gently with a scrungie or clean washcloth.  5.  Apply the CHG Soap to your body ONLY FROM THE NECK DOWN.   Do   not use on face/ open                           Wound or open sores. Avoid contact with eyes, ears mouth and   genitals (private parts).                       Wash face,  Genitals (private parts) with your normal soap.             6.  Wash thoroughly, paying special attention to the area where your    surgery  will be performed.  7.  Thoroughly rinse your body with warm water from the neck down.  8.  DO NOT shower/wash with your normal soap after using and rinsing off the CHG Soap.                9.  Pat yourself dry with a clean towel.            10.  Wear clean pajamas.            11.  Place clean sheets on your bed the night of your first shower and do not  sleep with pets. Day of Surgery : Do not apply any lotions/deodorants the morning of surgery.  Please wear clean clothes to the hospital/surgery center.  FAILURE TO FOLLOW THESE INSTRUCTIONS MAY RESULT IN THE CANCELLATION OF YOUR SURGERY  PATIENT SIGNATURE_________________________________  NURSE SIGNATURE__________________________________  ________________________________________________________________________

## 2023-09-16 NOTE — Progress Notes (Signed)
 COVID Vaccine Completed:  Date of COVID positive in last 90 days: No  PCP - Larey Dresser, MD Cardiologist -  N/A  Chest x-ray - N/A EKG - 09-20-23 Epic Stress Test -  N/A ECHO -  N/A Cardiac Cath -  peripheral vascular catherization Pacemaker/ICD device last checked: Spinal Cord Stimulator: N/A Penile Implant  Bowel Prep - N/A  Sleep Study - Yes, sleep apnea CPAP - CPAP  Fasting Blood Sugar - N/A Checks Blood Sugar _____ times a day  Last dose of GLP1 agonist-  N/A GLP1 instructions:  Hold 7 days before surgery    Last dose of SGLT-2 inhibitors-  N/A SGLT-2 instructions:  Hold 3 days before surgery   Blood Thinner Instructions: N/A Aspirin Instructions: Last Dose:  Activity level:  Can go up a flight of stairs and perform activities of daily living without stopping and without symptoms of chest pain or shortness of breath.  Anesthesia review:  Hx of superior mesenteric artery thrombosis, HTN, OSA with CPAP  Patient denies shortness of breath, fever, cough and chest pain at PAT appointment (completed over the phone)  Patient verbalized understanding of instructions that were given to them at the PAT appointment. Patient was also instructed that they will need to review over the PAT instructions again at home before surgery.

## 2023-09-17 ENCOUNTER — Encounter (HOSPITAL_COMMUNITY): Admission: RE | Admit: 2023-09-17 | Source: Ambulatory Visit

## 2023-09-20 ENCOUNTER — Encounter (HOSPITAL_COMMUNITY)
Admission: RE | Admit: 2023-09-20 | Discharge: 2023-09-20 | Disposition: A | Discharge status: Home / Self Care | Source: Ambulatory Visit | Attending: Urology | Admitting: Urology

## 2023-09-20 DIAGNOSIS — Z01818 Encounter for other preprocedural examination: Secondary | ICD-10-CM | POA: Insufficient documentation

## 2023-09-20 DIAGNOSIS — Z01812 Encounter for preprocedural laboratory examination: Secondary | ICD-10-CM | POA: Diagnosis present

## 2023-09-20 DIAGNOSIS — D649 Anemia, unspecified: Secondary | ICD-10-CM | POA: Insufficient documentation

## 2023-09-20 DIAGNOSIS — Z01810 Encounter for preprocedural cardiovascular examination: Secondary | ICD-10-CM | POA: Diagnosis present

## 2023-09-20 DIAGNOSIS — I10 Essential (primary) hypertension: Secondary | ICD-10-CM | POA: Diagnosis not present

## 2023-09-20 LAB — CBC
HCT: 43.8 % (ref 39.0–52.0)
Hemoglobin: 14 g/dL (ref 13.0–17.0)
MCH: 27.6 pg (ref 26.0–34.0)
MCHC: 32 g/dL (ref 30.0–36.0)
MCV: 86.4 fL (ref 80.0–100.0)
Platelets: 224 10*3/uL (ref 150–400)
RBC: 5.07 MIL/uL (ref 4.22–5.81)
RDW: 14.6 % (ref 11.5–15.5)
WBC: 3.4 10*3/uL — ABNORMAL LOW (ref 4.0–10.5)
nRBC: 0 % (ref 0.0–0.2)

## 2023-09-20 LAB — BASIC METABOLIC PANEL
Anion gap: 8 (ref 5–15)
BUN: 10 mg/dL (ref 6–20)
CO2: 25 mmol/L (ref 22–32)
Calcium: 8.6 mg/dL — ABNORMAL LOW (ref 8.9–10.3)
Chloride: 107 mmol/L (ref 98–111)
Creatinine, Ser: 1.08 mg/dL (ref 0.61–1.24)
GFR, Estimated: >60 mL/min (ref >60)
Glucose, Bld: 82 mg/dL (ref 70–99)
Potassium: 3.8 mmol/L (ref 3.5–5.1)
Sodium: 140 mmol/L (ref 135–145)

## 2023-09-22 ENCOUNTER — Ambulatory Visit (HOSPITAL_COMMUNITY): Admitting: Registered Nurse

## 2023-09-22 ENCOUNTER — Ambulatory Visit (HOSPITAL_COMMUNITY)
Admission: RE | Admit: 2023-09-22 | Discharge: 2023-09-22 | Disposition: A | Discharge status: Home / Self Care | Attending: Urology | Admitting: Urology

## 2023-09-22 ENCOUNTER — Ambulatory Visit (HOSPITAL_BASED_OUTPATIENT_CLINIC_OR_DEPARTMENT_OTHER): Admitting: Registered Nurse

## 2023-09-22 ENCOUNTER — Other Ambulatory Visit: Payer: Self-pay

## 2023-09-22 ENCOUNTER — Encounter (HOSPITAL_COMMUNITY)
Admission: RE | Disposition: A | Discharge status: Home / Self Care | Payer: Self-pay | Source: Home / Self Care | Attending: Urology

## 2023-09-22 ENCOUNTER — Ambulatory Visit (HOSPITAL_COMMUNITY)

## 2023-09-22 ENCOUNTER — Encounter (HOSPITAL_COMMUNITY): Payer: Self-pay | Admitting: Urology

## 2023-09-22 DIAGNOSIS — Z87442 Personal history of urinary calculi: Secondary | ICD-10-CM | POA: Insufficient documentation

## 2023-09-22 DIAGNOSIS — G4730 Sleep apnea, unspecified: Secondary | ICD-10-CM | POA: Insufficient documentation

## 2023-09-22 DIAGNOSIS — I10 Essential (primary) hypertension: Secondary | ICD-10-CM | POA: Diagnosis not present

## 2023-09-22 DIAGNOSIS — Z9079 Acquired absence of other genital organ(s): Secondary | ICD-10-CM | POA: Diagnosis not present

## 2023-09-22 DIAGNOSIS — Z8546 Personal history of malignant neoplasm of prostate: Secondary | ICD-10-CM | POA: Diagnosis not present

## 2023-09-22 DIAGNOSIS — Z87891 Personal history of nicotine dependence: Secondary | ICD-10-CM | POA: Diagnosis not present

## 2023-09-22 DIAGNOSIS — C61 Malignant neoplasm of prostate: Secondary | ICD-10-CM | POA: Insufficient documentation

## 2023-09-22 DIAGNOSIS — D649 Anemia, unspecified: Secondary | ICD-10-CM

## 2023-09-22 DIAGNOSIS — N393 Stress incontinence (female) (male): Secondary | ICD-10-CM | POA: Insufficient documentation

## 2023-09-22 DIAGNOSIS — N200 Calculus of kidney: Secondary | ICD-10-CM | POA: Diagnosis present

## 2023-09-22 DIAGNOSIS — N202 Calculus of kidney with calculus of ureter: Secondary | ICD-10-CM

## 2023-09-22 HISTORY — DX: Personal history of urinary calculi: Z87.442

## 2023-09-22 HISTORY — DX: Anemia, unspecified: D64.9

## 2023-09-22 HISTORY — PX: CYSTOSCOPY/URETEROSCOPY/HOLMIUM LASER/STENT PLACEMENT: SHX6546

## 2023-09-22 HISTORY — DX: Anxiety disorder, unspecified: F41.9

## 2023-09-22 HISTORY — PX: CYSTOSCOPY W/ RETROGRADES: SHX1426

## 2023-09-22 HISTORY — DX: Depression, unspecified: F32.A

## 2023-09-22 SURGERY — CYSTOSCOPY/URETEROSCOPY/HOLMIUM LASER/STENT PLACEMENT
Anesthesia: General | Laterality: Left

## 2023-09-22 MED ORDER — FENTANYL CITRATE (PF) 100 MCG/2ML IJ SOLN
INTRAMUSCULAR | Status: AC
  Filled 2023-09-22: qty 2

## 2023-09-22 MED ORDER — PROPOFOL 10 MG/ML IV BOLUS
INTRAVENOUS | Status: DC | PRN
Start: 2023-09-22 — End: 2023-09-22
  Administered 2023-09-22: 200 mg via INTRAVENOUS

## 2023-09-22 MED ORDER — PHENYLEPHRINE 80 MCG/ML (10ML) SYRINGE FOR IV PUSH (FOR BLOOD PRESSURE SUPPORT)
PREFILLED_SYRINGE | INTRAVENOUS | Status: DC | PRN
  Administered 2023-09-22: 80 ug via INTRAVENOUS

## 2023-09-22 MED ORDER — ONDANSETRON HCL 4 MG/2ML IJ SOLN
INTRAMUSCULAR | Status: DC | PRN
  Administered 2023-09-22: 4 mg via INTRAVENOUS

## 2023-09-22 MED ORDER — SODIUM CHLORIDE 0.9 % IR SOLN
Status: DC | PRN
  Administered 2023-09-22: 3000 mL

## 2023-09-22 MED ORDER — FENTANYL CITRATE (PF) 100 MCG/2ML IJ SOLN
INTRAMUSCULAR | Status: DC | PRN
  Administered 2023-09-22 (×3): 50 ug via INTRAVENOUS

## 2023-09-22 MED ORDER — CHLORHEXIDINE GLUCONATE 0.12 % MT SOLN
15.0000 mL | Freq: Once | OROMUCOSAL | Status: AC
  Administered 2023-09-22: 15 mL via OROMUCOSAL

## 2023-09-22 MED ORDER — DEXMEDETOMIDINE HCL IN NACL 80 MCG/20ML IV SOLN
INTRAVENOUS | Status: DC | PRN
  Administered 2023-09-22: 8 ug via INTRAVENOUS

## 2023-09-22 MED ORDER — LACTATED RINGERS IV SOLN: INTRAVENOUS | Status: DC

## 2023-09-22 MED ORDER — LIDOCAINE HCL (PF) 2 % IJ SOLN
INTRAMUSCULAR | Status: AC
  Filled 2023-09-22: qty 5

## 2023-09-22 MED ORDER — SENNOSIDES-DOCUSATE SODIUM 8.6-50 MG PO TABS: 1.0000 | ORAL_TABLET | Freq: Two times a day (BID) | ORAL | 0 refills | Status: DC

## 2023-09-22 MED ORDER — GLYCOPYRROLATE 0.2 MG/ML IJ SOLN
INTRAMUSCULAR | Status: DC | PRN
  Administered 2023-09-22: .1 mg via INTRAVENOUS

## 2023-09-22 MED ORDER — KETOROLAC TROMETHAMINE 10 MG PO TABS: 10.0000 mg | ORAL_TABLET | Freq: Three times a day (TID) | ORAL | 0 refills | Status: DC | PRN

## 2023-09-22 MED ORDER — ACETAMINOPHEN 500 MG PO TABS
1000.0000 mg | ORAL_TABLET | Freq: Once | ORAL | Status: AC
  Administered 2023-09-22: 1000 mg via ORAL
  Filled 2023-09-22: qty 2

## 2023-09-22 MED ORDER — FENTANYL CITRATE PF 50 MCG/ML IJ SOSY: 25.0000 ug | PREFILLED_SYRINGE | INTRAMUSCULAR | Status: DC | PRN

## 2023-09-22 MED ORDER — OXYCODONE HCL 5 MG PO TABS: 5.0000 mg | ORAL_TABLET | Freq: Once | ORAL | Status: DC | PRN

## 2023-09-22 MED ORDER — PHENYLEPHRINE 80 MCG/ML (10ML) SYRINGE FOR IV PUSH (FOR BLOOD PRESSURE SUPPORT)
PREFILLED_SYRINGE | INTRAVENOUS | Status: AC
  Filled 2023-09-22: qty 20

## 2023-09-22 MED ORDER — DEXTROSE 5 % IV SOLN
5.0000 mg/kg | INTRAVENOUS | Status: AC
  Administered 2023-09-22: 370 mg via INTRAVENOUS
  Filled 2023-09-22: qty 9.25

## 2023-09-22 MED ORDER — OXYCODONE HCL 5 MG/5ML PO SOLN: 5.0000 mg | Freq: Once | ORAL | Status: DC | PRN

## 2023-09-22 MED ORDER — MIDAZOLAM HCL 5 MG/5ML IJ SOLN
INTRAMUSCULAR | Status: DC | PRN
  Administered 2023-09-22: 1 mg via INTRAVENOUS

## 2023-09-22 MED ORDER — OXYCODONE-ACETAMINOPHEN 5-325 MG PO TABS
1.0000 | ORAL_TABLET | Freq: Four times a day (QID) | ORAL | 0 refills | Status: DC | PRN
Start: 2023-09-22 — End: 2023-12-28

## 2023-09-22 MED ORDER — ONDANSETRON HCL 4 MG/2ML IJ SOLN
INTRAMUSCULAR | Status: AC
  Filled 2023-09-22: qty 2

## 2023-09-22 MED ORDER — LIDOCAINE 2% (20 MG/ML) 5 ML SYRINGE
INTRAMUSCULAR | Status: DC | PRN
  Administered 2023-09-22: 100 mg via INTRAVENOUS

## 2023-09-22 MED ORDER — CEPHALEXIN 500 MG PO CAPS: 500.0000 mg | ORAL_CAPSULE | Freq: Two times a day (BID) | ORAL | 0 refills | Status: DC

## 2023-09-22 MED ORDER — ORAL CARE MOUTH RINSE: 15.0000 mL | Freq: Once | OROMUCOSAL | Status: AC

## 2023-09-22 MED ORDER — MIDAZOLAM HCL 2 MG/2ML IJ SOLN
INTRAMUSCULAR | Status: AC
  Filled 2023-09-22: qty 2

## 2023-09-22 MED ORDER — DROPERIDOL 2.5 MG/ML IJ SOLN: 0.6250 mg | Freq: Once | INTRAMUSCULAR | Status: DC | PRN

## 2023-09-22 MED ORDER — IOHEXOL 300 MG/ML  SOLN
INTRAMUSCULAR | Status: DC | PRN
  Administered 2023-09-22: 14 mL

## 2023-09-22 SURGICAL SUPPLY — 22 items
BAG COUNTER SPONGE SURGICOUNT (BAG) IMPLANT
BAG URO CATCHER STRL LF (MISCELLANEOUS) ×1 IMPLANT
BASKET LASER NITINOL 1.9FR (BASKET) IMPLANT
CATH URETL OPEN END 6FR 70 (CATHETERS) ×1 IMPLANT
CLOTH BEACON ORANGE TIMEOUT ST (SAFETY) ×1 IMPLANT
EXTRACTOR STONE 1.7FRX115CM (UROLOGICAL SUPPLIES) IMPLANT
GLOVE SURG LX STRL 7.5 STRW (GLOVE) ×1 IMPLANT
GOWN STRL REUS W/ TWL XL LVL3 (GOWN DISPOSABLE) ×1 IMPLANT
GUIDEWIRE ANG ZIPWIRE 038X150 (WIRE) ×1 IMPLANT
GUIDEWIRE STR DUAL SENSOR (WIRE) ×1 IMPLANT
KIT TURNOVER KIT A (KITS) IMPLANT
LASER FIB FLEXIVA PULSE ID 365 (Laser) IMPLANT
MANIFOLD NEPTUNE II (INSTRUMENTS) ×1 IMPLANT
NS IRRIG 1000ML POUR BTL (IV SOLUTION) IMPLANT
PACK CYSTO (CUSTOM PROCEDURE TRAY) ×1 IMPLANT
SHEATH NAVIGATOR HD 11/13X28 (SHEATH) IMPLANT
SHEATH NAVIGATOR HD 11/13X36 (SHEATH) IMPLANT
STENT POLARIS 5FRX24 (STENTS) IMPLANT
TRACTIP FLEXIVA PULS ID 200XHI (Laser) IMPLANT
TUBE PU 8FR 16IN ENFIT (TUBING) ×1 IMPLANT
TUBING CONNECTING 10 (TUBING) ×1 IMPLANT
TUBING UROLOGY SET (TUBING) ×1 IMPLANT

## 2023-09-22 NOTE — Anesthesia Preprocedure Evaluation (Addendum)
 Anesthesia Evaluation  Patient identified by MRN, date of birth, ID band Patient awake    Reviewed: Allergy & Precautions, NPO status , Patient's Chart, lab work & pertinent test results  History of Anesthesia Complications Negative for: history of anesthetic complications  Airway Mallampati: II  TM Distance: >3 FB Neck ROM: Full    Dental  (+) Missing,    Pulmonary sleep apnea and Continuous Positive Airway Pressure Ventilation , former smoker   Pulmonary exam normal        Cardiovascular hypertension, Pt. on medications + Peripheral Vascular Disease  Normal cardiovascular exam     Neuro/Psych   Anxiety Depression       GI/Hepatic negative GI ROS, Neg liver ROS,,,  Endo/Other  negative endocrine ROS    Renal/GU LEFT RENAL STONE     Musculoskeletal negative musculoskeletal ROS (+)    Abdominal   Peds  Hematology negative hematology ROS (+)   Anesthesia Other Findings Day of surgery medications reviewed with patient.  Reproductive/Obstetrics                              Anesthesia Physical Anesthesia Plan  ASA: 2  Anesthesia Plan: General   Post-op Pain Management: Tylenol PO (pre-op)*   Induction: Intravenous  PONV Risk Score and Plan: 2 and Treatment may vary due to age or medical condition, Ondansetron, Dexamethasone and Midazolam  Airway Management Planned: LMA  Additional Equipment: None  Intra-op Plan:   Post-operative Plan: Extubation in OR  Informed Consent: I have reviewed the patients History and Physical, chart, labs and discussed the procedure including the risks, benefits and alternatives for the proposed anesthesia with the patient or authorized representative who has indicated his/her understanding and acceptance.     Dental advisory given  Plan Discussed with: CRNA  Anesthesia Plan Comments:         Anesthesia Quick Evaluation

## 2023-09-22 NOTE — H&P (Signed)
 Marcus Pruitt is an 53 y.o. male.    Chief Complaint: Pre=Op LEFT Ureteroscopic stone manipulation   HPI:   1 - Moderate Risk Prostate Cancer - s/p robotic prostatectomy + bilateral limited pelvic lymphadenectomy 01/2016 for [id number] Gleason 3+4=7 adenocarcinoma with negative margins. Pre-op TRUS and PSA 3.95.   Summarized Post-op Course:  04/2022 - PSA <.01  05/2023 - PSA <.01   2 - Stress Urinary Incontinence - s/p prostatectomy 01/2016. Received peri-op PT. At 6 weeks post-op using 3 pads per day, negative cough stress test, dry at night. He is compliant with PT. At 3 mos+ pad free. Some urine loss with orgasm only, but this is expected, manages with condom or pre=intercourse sudafed.   3 - Erectile Dysfunction - s/p prostatectomy 01/2016 with moderate bilateral nerve sparing. Underwent IPP 2020 by Terlecki at [facility].   4 - Urolithiasis - s/ bilateral ureteroscoy 01/2019 for left ureteral / bilateral renal stones to stone free. Composition 100% CaOx. CT 2025 with 1cm LLP non-obstructing stone.    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 He works on floor at [employer], 12 hr shifts. His PCP is Marcus Crane MD with Marcus Pruitt.   Today "Marcus Pruitt" is seen to proceed with LEFT ureteroscopic stone manipulation for recurrent stone with occasional hematuria. Most recent UA without infectious parameters.   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 Leone Payor, adenomatous polyp, repeat in 3 yrs    CYSTOSCOPY WITH RETROGRADE PYELOGRAM, URETEROSCOPY AND STENT PLACEMENT Bilateral 02/03/2019   Procedure: CYSTOSCOPY WITH RETROGRADE PYELOGRAM, URETEROSCOPY AND STENT PLACEMENT;  Surgeon: Sebastian Ache, MD;  Location: [facility] ORS;  Service: Urology;  Laterality: Bilateral;  75 MINS   GIVENS CAPSULE STUDY N/A 08/26/2017   Procedure: GIVENS CAPSULE STUDY;  Surgeon: Iva Boop, MD;  Location: [facility] ENDOSCOPY;  Service: Endoscopy;  Laterality: N/A;   HOLMIUM LASER APPLICATION Bilateral 02/03/2019   Procedure: HOLMIUM LASER APPLICATION;  Surgeon: Sebastian Ache, MD;  Location: [facility];  Service: Urology;  Laterality: Bilateral;   INGUINAL HERNIA REPAIR Left 02/21/2016   Procedure: LAPAROSCOPIC INGUINAL HERNIA;  Surgeon: Sebastian Ache, MD;  Location: [facility];  Service: Urology;  Laterality: Left;   LYMPHADENECTOMY  Bilateral 02/21/2016   Procedure: PELVIC LYMPHADENECTOMY;  Surgeon: Sebastian Ache, MD;  Location: [facility];  Service: Urology;  Laterality: Bilateral;   MANDIBLE SURGERY  1990   MESENTERIC ARTERY BYPASS N/A 05/15/2017   Procedure: OPEN FENESTRATION SUPERIOR MESENTERIC ARTERY;  Surgeon: Fransisco Hertz, MD;  Location: [facility] OR;  Service: Vascular;  Laterality: N/A;   PATCH ANGIOPLASTY N/A 05/15/2017   Procedure: SUPERIOR MESENTERIC ARTERY PATCH ANGIOPLASTY USING PERI-GUARD PATCH;  Surgeon: Fransisco Hertz, MD;   Location: [facility] OR;  Service: Vascular;  Laterality: N/A;   PERCUTANEOUS VENOUS THROMBECTOMY,LYSIS WITH INTRAVASCULAR ULTRASOUND (IVUS) N/A 05/15/2017   Procedure: THROMBECTOMY OF SUPERIOR MESENTERIC ARTERY;  Surgeon: Fransisco Hertz, MD;  Location: [facility] OR;  Service: Vascular;  Laterality: N/A;   ROBOT ASSISTED LAPAROSCOPIC RADICAL PROSTATECTOMY N/A 02/21/2016   Procedure: XI ROBOTIC ASSISTED LAPAROSCOPIC RADICAL PROSTATECTOMY;  Surgeon: Sebastian Ache, MD;  Location: [facility];  Service: Urology;  Laterality: N/A;   UMBILICAL HERNIA REPAIR  02/21/2016   Procedure: LAPAROSCOPIC UMBILICAL HERNIA;  Surgeon: Sebastian Ache, MD;  Location: [facility];  Service: Urology;;   Sherrie George  07/29/2017   Procedure: Visceral Angiogram;  Surgeon: Fransisco Hertz, MD;  Location: [facility] INVASIVE CV LAB;  Service: Cardiovascular;;    Family History  Problem Relation Age of Onset   Heart disease Maternal Grandmother    Diabetes Other        fhx   Prostate cancer Father    Hypertension Mother    Stroke Mother    Colon cancer Neg Hx    Colon polyps Neg Hx    Esophageal cancer Neg Hx    Rectal cancer Neg Hx    Stomach cancer Neg Hx    Social History:  reports that he quit smoking about 16 years ago. His smoking use included cigarettes. He has never used smokeless tobacco. He reports that he does not currently use alcohol. He reports that he does not use drugs.  Allergies:  Allergies  Allergen Reactions   Bee Venom Anaphylaxis and Shortness Of Breath    SOB, , hand swelled up after getting stung on her hand    No medications prior to admission.    Results for orders placed or performed during the hospital encounter of 09/20/23 (from the past 48 hours)  Basic metabolic panel per protocol     Status: Abnormal   Collection Time: 09/20/23  1:02 PM  Result Value Ref Range   Sodium 140 135 - 145 mmol/L   Potassium 3.8 3.5 - 5.1 mmol/L   Chloride 107 98 - 111 mmol/L   CO2 25 22 - 32 mmol/L   Glucose, Bld 82 70 -  99 mg/dL    Comment: Glucose reference range applies only to samples taken after fasting for at least 8 hours.   BUN 10 6 - 20 mg/dL   Creatinine, Ser 1.61 0.61 - 1.24 mg/dL   Calcium 8.6 (L) 8.9 - 10.3 mg/dL   GFR, Estimated >09 >60 mL/min    Comment: (NOTE) Calculated using the CKD-EPI Creatinine Equation (2021)    Anion gap 8 5 - 15    Comment: Performed at [facility], [address], [place], [place] [address]  CBC per protocol     Status: Abnormal   Collection Time: 09/20/23  1:02 PM  Result Value Ref Range   WBC 3.4 (L) 4.0 - 10.5 K/uL   RBC 5.07 4.22 - 5.81 MIL/uL   Hemoglobin 14.0 13.0 - 17.0 g/dL  HCT 43.8 39.0 - 52.0 %   MCV 86.4 80.0 - 100.0 fL   MCH 27.6 26.0 - 34.0 pg   MCHC 32.0 30.0 - 36.0 g/dL   RDW 16.1 09.6 - 04.5 %   Platelets 224 150 - 400 K/uL   nRBC 0.0 0.0 - 0.2 %    Comment: Performed at [facility], [address] [place], [place] [address]   No results found.  Review of Systems  Constitutional:  Negative for chills and fever.  All other systems reviewed and are negative.   Height 5\' 6"  (1.676 m), weight 88.9 kg. Physical Exam Vitals reviewed.  HENT:     Head: Normocephalic.  Eyes:     Pupils: Pupils are equal, round, and reactive to light.  Cardiovascular:     Rate and Rhythm: Normal rate.  Pulmonary:     Effort: Pulmonary effort is normal.  Abdominal:     General: Abdomen is flat.  Genitourinary:    Comments: No  CVAT at present. Penile prosthesis w/o erosion.  Musculoskeletal:        General: Normal range of motion.     Cervical back: Normal range of motion.  Skin:    General: Skin is warm.  Neurological:     General: No focal deficit present.     Mental Status: He is alert.  Psychiatric:        Mood and Affect: Mood normal.      Assessment/Plan  Proceed as planned with LEFT ureteroscopic stone manipulation. Risks, benefits, alternatives, expected peri-op course discussed previously  and reiterated today.   Loletta Parish., MD 09/22/2023, 7:39 AM

## 2023-09-22 NOTE — Anesthesia Procedure Notes (Addendum)
 Procedure Name: LMA Insertion Date/Time: 09/22/2023 2:26 PM  Performed by: Sharyn Dross, CRNAPre-anesthesia Checklist: Patient identified, Emergency Drugs available, Suction available and Patient being monitored Patient Re-evaluated:Patient Re-evaluated prior to induction Oxygen Delivery Method: Circle system utilized Preoxygenation: Pre-oxygenation with 100% oxygen Induction Type: IV induction Ventilation: Mask ventilation without difficulty LMA: LMA inserted LMA Size: 5.0 Number of attempts: 1 Placement Confirmation: positive ETCO2 and breath sounds checked- equal and bilateral Tube secured with: Tape Dental Injury: Teeth and Oropharynx as per pre-operative assessment

## 2023-09-22 NOTE — Brief Op Note (Signed)
 09/22/2023  3:24 PM  PATIENT:  Marcus Pruitt  53 y.o. male  PRE-OPERATIVE DIAGNOSIS:  LEFT RENAL STONE  POST-OPERATIVE DIAGNOSIS:  LEFT RENAL STONE  PROCEDURE:  Procedure(s): CYSTOSCOPY/URETEROSCOPY/HOLMIUM LASER/STENT PLACEMENT (Left) CYSTOSCOPY, WITH RETROGRADE PYELOGRAM (Left)  SURGEON:  Surgeons and Role:    * Manny, Delbert Phenix., MD - Primary  PHYSICIAN ASSISTANT:   ASSISTANTS: none   ANESTHESIA:   general  EBL:  5 mL   BLOOD ADMINISTERED:none  DRAINS: none   LOCAL MEDICATIONS USED:  NONE  SPECIMEN:  Source of Specimen:  left renal stone fragments  DISPOSITION OF SPECIMEN:   [facility] for compsotional analysis  COUNTS:  YES  TOURNIQUET:  * No tourniquets in log *  DICTATION: .Other Dictation: Dictation Number [id number]  PLAN OF CARE: Discharge to home after PACU  PATIENT DISPOSITION:  PACU - hemodynamically stable.   Delay start of Pharmacological VTE agent (>24hrs) due to surgical blood loss or risk of bleeding: yes

## 2023-09-22 NOTE — Discharge Instructions (Addendum)
1 - You may have urinary urgency (bladder spasms) and bloody urine on / off with stent in place. This is normal.  2 - Removed tethered stent on Friday morning at home by pulling on string, then blue-white plastic tubing, and discarding. Office is open Friday if any issues arise.   3 - Call MD or go to ER for fever >102, severe pain / nausea / vomiting not relieved by medications, or acute change in medical status

## 2023-09-22 NOTE — Transfer of Care (Signed)
 Immediate Anesthesia Transfer of Care Note  Patient: Marcus Pruitt  Procedure(s) Performed: CYSTOSCOPY/URETEROSCOPY/HOLMIUM LASER/STENT PLACEMENT (Left) CYSTOSCOPY, WITH RETROGRADE PYELOGRAM (Left)  Patient Location: PACU  Anesthesia Type:General  Level of Consciousness: drowsy  Airway & Oxygen Therapy: Patient connected to face mask oxygen  Post-op Assessment: Report given to RN and Post -op Vital signs reviewed and stable  Post vital signs: Reviewed and stable  Last Vitals:  Vitals Value Taken Time  BP 102/72 09/22/23 1536  Temp    Pulse 60 09/22/23 1538  Resp 8 09/22/23 1538  SpO2 97 % 09/22/23 1538  Vitals shown include unfiled device data.  Last Pain:  Vitals:   09/22/23 1221  TempSrc:   PainSc: 0-No pain      Patients Stated Pain Goal: 5 (09/22/23 1209)  Complications: There were no known notable events for this encounter.

## 2023-09-23 ENCOUNTER — Encounter (HOSPITAL_COMMUNITY): Payer: Self-pay | Admitting: Urology

## 2023-09-23 NOTE — Op Note (Signed)
 Marcus Pruitt, MCCASLIN MEDICAL RECORD NO: [id number] ACCOUNT NO: [id number] DATE OF BIRTH: 1970/09/30 FACILITY: [facility] LOCATION: [facility] PHYSICIAN: Sebastian Ache, MD  Operative Report   SURGEON:  Sebastian Ache, MD.  PREOPERATIVE DIAGNOSES:  Left renal stone, hematuria.  PROCEDURE PERFORMED: 1.  Cystoscopy with left retrograde pyelogram interpretation. 2.  Left ureteroscopy with laser lithotripsy. 3.  Insertion of left ureteral stent.  ESTIMATED BLOOD LOSS: Nil.  COMPLICATIONS: None.  SPECIMENS:  Left renal stones for analysis.  FINDINGS: 1.  Unremarkable bladder. 2.  Multifocal left lower pole renal stone, total volume of approximately 1.2 cm. 3.  Complete resolution of all accessible stone fragments larger than one-third mm following laser lithotripsy, basket extraction. 4.  Successful placement of left ureteral stent, proximal end in the renal pelvis and distal end in the urinary bladder with tether.  INDICATIONS:  The patient is a 53 year old man with a history of prostate cancer status post prostatectomy years ago.  He has done well from this.  He is also status post penile prosthesis. He was found on workup of gross hematuria to have multifocal  left renal stones in the lower pole, total volume approximately 1 to 1.5 cm.  Options discussed including observation versus shockwave lithotripsy versus ureteroscopy, and he agreed upon ureteroscopy with the goal of stone free as lower pole location is  not favorable for shockwave lithotripsy.  This will also complete his hematuria evaluation. Informed consent was obtained and placed in the medical record.   PROCEDURE IN DETAIL:  The patient being Jaicob Dia verified and procedure being left ureteroscopy and stent placement was confirmed.  Procedure timeout was performed.  Intravenous antibiotics were administered.  General anesthesia induced.  The patient  was placed into a low lithotomy position.  Sterile field was created, prepped and  draped the patient's penis, perineum and proximal thighs using iodine.  Cystourethroscopy was performed using 21-French rigid cystoscope with offset lens.  Inspection of  anterior and posterior urethra was unremarkable.  Inspection of the urinary bladder revealed no diverticula, calcifications, papillary lesions.  Prostate was surgically absent.  Left ureter was cannulated with a 6-French end-hole catheter and left  retrograde pyelogram was obtained.  Left retrograde pyelogram demonstrated single left ureter, single system left kidney.  There was filling defect in the lower pole consistent with known stone.  Kidney was somewhat bifid in orientation.  A 0.038 ZIPwire was advanced to level of the ___  pole and set aside as a safety wire and 8-French feeding tube was placed in the urinary bladder for pressure release.  A semirigid ureteroscopy performed of the distal three-fourths of the left ureter alongside a separate sensor working wire.  No mucosal  abnormalities were found.  Semirigid ureteroscope was then exchanged for a median length ureteral access sheath to the level of the mid ureter using continuous fluoroscopic guidance and flexible digital ureteroscopy was performed of the proximal left  ureter with systematic inspection of the left kidney.  As anticipated, there was multifocal lower pole renal stone and approximately 3 calcifications sitting in tandem.  Total stone volume estimated to be 1.2 cm.  The stones were mostly large for simple  basketing. As such, Holmium laser energy was applied to stone in the settings of 0.2 joules and 40 Hz.  Approximately 50% of the stone was dusted, 50% fragmented with fragments being amenable to basketing and sequential basketing was performed using  Escape basket. Numerous small fragments were removed, such that all fragments larger than one-third mm  had been cleared from the kidney. Following this, there was excellent hemostasis. No evidence of perforation.  Access sheath was removed under direct  continuous incision.  No significant mucosal abnormalities found.  Given access sheath usage, we felt brief interval stenting with tethered stent would be mostly prudent.  As such a new 5 x 24 stent Polaris type stent was carefully placed using  fluoroscopic guidance.  Good proximal and distal plane were noted.  Tether was left in place, fashioned to the dorsum of the penis and the procedure was terminated.  The patient tolerated the procedure well.  No immediate periprocedural complications.   The patient was taken to the postanesthesia care unit in stable condition with plan for discharge home.    NIK D: 09/22/2023 3:29:51 pm T: 09/23/2023 2:31:00 am  JOB: [id number]/ [id number]

## 2023-09-23 NOTE — Anesthesia Postprocedure Evaluation (Signed)
 Anesthesia Post Note  Patient: Marcus Pruitt  Procedure(s) Performed: CYSTOSCOPY/URETEROSCOPY/HOLMIUM LASER/STENT PLACEMENT (Left) CYSTOSCOPY, WITH RETROGRADE PYELOGRAM (Left)     Patient location during evaluation: PACU Anesthesia Type: General Level of consciousness: awake and alert Pain management: pain level controlled Vital Signs Assessment: post-procedure vital signs reviewed and stable Respiratory status: spontaneous breathing, nonlabored ventilation and respiratory function stable Cardiovascular status: blood pressure returned to baseline Postop Assessment: no apparent nausea or vomiting Anesthetic complications: no   There were no known notable events for this encounter.  Last Vitals:  Vitals:   09/22/23 1615 09/22/23 1630  BP: 119/86 (!) 125/91  Pulse: 64 (!) 58  Resp: 15 13  Temp:  36.6 C  SpO2: 94% 94%    Last Pain:  Vitals:   09/22/23 1630  TempSrc:   PainSc: 0-No pain                 Shanda Howells

## 2023-09-25 ENCOUNTER — Emergency Department (HOSPITAL_COMMUNITY)
Admission: EM | Admit: 2023-09-25 | Discharge: 2023-09-25 | Disposition: A | Discharge status: Home / Self Care | Attending: Emergency Medicine | Admitting: Emergency Medicine

## 2023-09-25 ENCOUNTER — Emergency Department (HOSPITAL_COMMUNITY)

## 2023-09-25 DIAGNOSIS — N200 Calculus of kidney: Secondary | ICD-10-CM

## 2023-09-25 DIAGNOSIS — N390 Urinary tract infection, site not specified: Secondary | ICD-10-CM | POA: Insufficient documentation

## 2023-09-25 DIAGNOSIS — R109 Unspecified abdominal pain: Secondary | ICD-10-CM | POA: Diagnosis present

## 2023-09-25 DIAGNOSIS — N132 Hydronephrosis with renal and ureteral calculous obstruction: Secondary | ICD-10-CM | POA: Insufficient documentation

## 2023-09-25 LAB — URINALYSIS, ROUTINE W REFLEX MICROSCOPIC
Bilirubin Urine: NEGATIVE
Glucose, UA: 50 mg/dL — AB
Ketones, ur: NEGATIVE mg/dL
Leukocytes,Ua: NEGATIVE
Nitrite: NEGATIVE
Protein, ur: 30 mg/dL — AB
RBC / HPF: >50 RBC/hpf (ref 0–5)
Specific Gravity, Urine: 1.01 (ref 1.005–1.030)
pH: 7 (ref 5.0–8.0)

## 2023-09-25 LAB — COMPREHENSIVE METABOLIC PANEL WITH GFR
ALT: 27 U/L (ref 0–44)
AST: 28 U/L (ref 15–41)
Albumin: 4 g/dL (ref 3.5–5.0)
Alkaline Phosphatase: 54 U/L (ref 38–126)
Anion gap: 7 (ref 5–15)
BUN: 14 mg/dL (ref 6–20)
CO2: 25 mmol/L (ref 22–32)
Calcium: 9.2 mg/dL (ref 8.9–10.3)
Chloride: 103 mmol/L (ref 98–111)
Creatinine, Ser: 0.98 mg/dL (ref 0.61–1.24)
GFR, Estimated: >60 mL/min (ref >60)
Glucose, Bld: 102 mg/dL — ABNORMAL HIGH (ref 70–99)
Potassium: 3.3 mmol/L — ABNORMAL LOW (ref 3.5–5.1)
Sodium: 135 mmol/L (ref 135–145)
Total Bilirubin: 0.5 mg/dL (ref 0.0–1.2)
Total Protein: 7.2 g/dL (ref 6.5–8.1)

## 2023-09-25 LAB — CBC
HCT: 43.3 % (ref 39.0–52.0)
Hemoglobin: 14.1 g/dL (ref 13.0–17.0)
MCH: 27.4 pg (ref 26.0–34.0)
MCHC: 32.6 g/dL (ref 30.0–36.0)
MCV: 84.1 fL (ref 80.0–100.0)
Platelets: 213 10*3/uL (ref 150–400)
RBC: 5.15 MIL/uL (ref 4.22–5.81)
RDW: 14.2 % (ref 11.5–15.5)
WBC: 4.7 10*3/uL (ref 4.0–10.5)
nRBC: 0 % (ref 0.0–0.2)

## 2023-09-25 MED ORDER — NITROFURANTOIN MONOHYD MACRO 100 MG PO CAPS: 100.0000 mg | ORAL_CAPSULE | Freq: Two times a day (BID) | ORAL | 0 refills | Status: DC

## 2023-09-25 MED ORDER — DOXYCYCLINE HYCLATE 100 MG PO TABS
100.0000 mg | ORAL_TABLET | Freq: Once | ORAL | Status: AC
  Administered 2023-09-25: 100 mg via ORAL
  Filled 2023-09-25: qty 1

## 2023-09-25 MED ORDER — KETOROLAC TROMETHAMINE 60 MG/2ML IM SOLN
30.0000 mg | Freq: Once | INTRAMUSCULAR | Status: DC
  Filled 2023-09-25: qty 2

## 2023-09-25 MED ORDER — DOXYCYCLINE HYCLATE 100 MG PO CAPS: 100.0000 mg | ORAL_CAPSULE | Freq: Two times a day (BID) | ORAL | 0 refills | Status: DC

## 2023-09-25 NOTE — ED Notes (Signed)
 Pt wants to hold off on pain Rx for now.  He states the pain has subsided at this time.

## 2023-09-25 NOTE — ED Triage Notes (Signed)
 Patient had stent placed Wednesday Removed it yesterday States pain started shortly after in left flank Got shot of pain medicine at [facility] urology Pain is worse today/meds not working On call urology directed patient to ED for scan  Pain rated 4/10

## 2023-09-25 NOTE — ED Provider Notes (Signed)
 [place] EMERGENCY DEPARTMENT AT [facility] Provider Note   CSN: [id number] Arrival date & time: 09/25/23  0801     History  Chief Complaint  Patient presents with   Flank Pain    Marcus Pruitt is a 53 y.o. male.  HPI 53 year old male history of state cancer, status post stent for kidney stone on Wednesday presents today with left flank pain.  Patient was undergoing hematuria workup and had left ureteroscopic stone manipulation performed Wednesday.  Stent was pulled on Friday as recommended.  He began having left flank pain on Friday and presented to the office and received ketorolac IM.  Pain decreased at that time for several hours.  Pain recurred this morning and patient took p.o. ketorolac and oxycodone at home without relief.  He is continuing to have bed tinted urine.  Denies urinary frequency, pain, or burning.  He has had some chills but no definite fever.  He had some nausea but no vomiting.     Home Medications Prior to Admission medications   Medication Sig Start Date End Date Taking? Authorizing Provider  allopurinol (ZYLOPRIM) 100 MG tablet TAKE 1 TABLET BY MOUTH TWICE A DAY 09/06/23   Nadara Mustard, MD  amLODipine (NORVASC) 5 MG tablet TAKE 1 TABLET BY MOUTH EVERY DAY 07/30/23   Nelwyn Salisbury, MD  cephALEXin (KEFLEX) 500 MG capsule Take 1 capsule (500 mg total) by mouth 2 (two) times daily. To prevent infection with tethered stent. 09/22/23   Loletta Parish., MD  colchicine 0.6 MG tablet TAKE 1 TABLET BY MOUTH EVERY DAY Patient taking differently: Take 0.6 mg by mouth daily as needed (GOUT FLARE). 06/28/23   Nadara Mustard, MD  EPINEPHrine (EPIPEN 2-PAK) 0.3 mg/0.3 mL IJ SOAJ injection Inject 0.3 mLs (0.3 mg total) into the muscle as needed for anaphylaxis. 03/06/20   Nelwyn Salisbury, MD  ketorolac (TORADOL) 10 MG tablet Take 1 tablet (10 mg total) by mouth every 8 (eight) hours as needed. For moderate pain or stent discomfort 09/22/23   Manny, Delbert Phenix., MD  oxyCODONE-acetaminophen (PERCOCET) 5-325 MG tablet Take 1 tablet by mouth every 6 (six) hours as needed for severe pain (pain score 7-10) (post-operatively). 09/22/23 09/21/24  Loletta Parish., MD  senna-docusate (SENOKOT-S) 8.6-50 MG tablet Take 1 tablet by mouth 2 (two) times daily. While taking strong pain meds to prevent constipation. 09/22/23   Loletta Parish., MD  TART CHERRY ULTRA GUMMIES PO Take 2 tablets by mouth daily.    [provider]      Allergies    Bee venom    Review of Systems   Review of Systems  Physical Exam Updated Vital Signs BP (!) 169/110 (BP Location: Left Arm)   Pulse 82   Temp 98.7 F (37.1 C) (Oral)   Resp 18   Ht 1.702 m (5\' 7" )   Wt 89.8 kg   SpO2 93%   BMI 31.01 kg/m  Physical Exam Vitals and nursing note reviewed.  HENT:     Head: Normocephalic.     Right Ear: External ear normal.     Left Ear: External ear normal.     Nose: Nose normal.     Mouth/Throat:     Pharynx: Oropharynx is clear.  Eyes:     Pupils: Pupils are equal, round, and reactive to light.  Cardiovascular:     Rate and Rhythm: Normal rate and regular rhythm.  Pulmonary:  Effort: Pulmonary effort is normal.     Breath sounds: Normal breath sounds.  Abdominal:     General: Bowel sounds are normal. There is distension.     Palpations: Abdomen is soft. There is no mass.     Tenderness: There is no abdominal tenderness. There is no guarding.     Comments: Previous midline scar with small bandage in place  Musculoskeletal:        General: Normal range of motion.     Cervical back: Normal range of motion.  Skin:    General: Skin is warm and dry.     Capillary Refill: Capillary refill takes less than 2 seconds.  Neurological:     General: No focal deficit present.     Mental Status: He is alert.     ED Results / Procedures / Treatments   Labs (all labs ordered are listed, but only abnormal results are displayed) Labs Reviewed   COMPREHENSIVE METABOLIC PANEL WITH GFR - Abnormal; Notable for the following components:      Result Value   Potassium 3.3 (*)    Glucose, Bld 102 (*)    All other components within normal limits  URINALYSIS, ROUTINE W REFLEX MICROSCOPIC - Abnormal; Notable for the following components:   APPearance HAZY (*)    Glucose, UA 50 (*)    Hgb urine dipstick MODERATE (*)    Protein, ur 30 (*)    Bacteria, UA RARE (*)    All other components within normal limits  URINE CULTURE  CBC    EKG None  Radiology CT Renal Stone Study Result Date: 09/25/2023 CLINICAL DATA:  Worsening left flank pain ureteral stent removal yesterday. EXAM: CT ABDOMEN AND PELVIS WITHOUT CONTRAST TECHNIQUE: Multidetector CT imaging of the abdomen and pelvis was performed following the standard protocol without IV contrast. RADIATION DOSE REDUCTION: This exam was performed according to the departmental dose-optimization program which includes automated exposure control, adjustment of the mA and/or kV according to patient size and/or use of iterative reconstruction technique. COMPARISON:  CT abdomen pelvis dated July 13, 2023. FINDINGS: Lower chest: No acute abnormality. Hepatobiliary: No focal liver abnormality is seen. No gallstones, gallbladder wall thickening, or biliary dilatation. Pancreas: Unremarkable. No pancreatic ductal dilatation or surrounding inflammatory changes. Spleen: Normal in size without focal abnormality. Adrenals/Urinary Tract: The adrenal glands are unremarkable. Previously noted 5 mm calculus in the lower pole of the left kidney is no longer identified, however there are multiple new punctate calculi throughout the left kidney. New mild left hydroureteronephrosis with perinephric and periureteral fat stranding. No ureteral calculi. The bladder is unremarkable. Stomach/Bowel: Stomach is within normal limits. Appendix appears normal. No evidence of bowel wall thickening, distention, or inflammatory changes.  Left-sided colonic diverticulosis. Vascular/Lymphatic: No significant vascular findings are present. No enlarged abdominal or pelvic lymph nodes. Reproductive: Prior prostatectomy.  Penile implant again noted. Other: Unchanged ventral abdominal wall diastasis and umbilical hernia containing nondilated loops of small bowel. Unchanged small fat containing right inguinal hernia. No free fluid or pneumoperitoneum. Musculoskeletal: No acute or significant osseous findings. IMPRESSION: 1. New mild left hydroureteronephrosis with perinephric and periureteral fat stranding. No ureteral calculi. Findings may be related to recently passed stone or ascending urinary tract infection. 2. Multiple new punctate nonobstructive left renal calculi. Electronically Signed   By: Obie Dredge M.D.   On: 09/25/2023 09:21    Procedures Procedures    Medications Ordered in ED Medications  ketorolac (TORADOL) injection 30 mg (has no administration in time  range)  doxycycline (VIBRA-TABS) tablet 100 mg (has no administration in time range)    ED Course/ Medical Decision Making/ A&P Clinical Course as of 09/25/23 1043  Sat Sep 25, 2023  0930 CT Renal [facility] [place] [JM]  8295 CBC reviewed interpreted significant for greater than 50 red blood cells and 21-50 white blood cells with rare bacteria [DR]  0937 CT abdomen significant for new mild left hydroureteronephrosis with perinephric and periureteral fat stranding.  No ureteral calculi, multiple new punctate nonobstructive left renal calculi [DR]  0952 Complete metabolic panel reviewed interpreted significant for mild hypokalemia potassium 3.3 otherwise within normal limits [DR]    Clinical Course User Index [DR] Margarita Grizzle, MD [JM] Mel Almond Vickii Chafe, Student-PA                                 Medical Decision Making Amount and/or Complexity of Data Reviewed Labs: ordered. Radiology: ordered.  Risk Prescription drug management.   53 year old male  history of prostate cancer, recent urinary procedure presents today with left flank pain. Differential diagnosis includes but is not limited to ureteral colic with retained stone, UTI, other infection from procedure, bleeding from procedure, musculoskeletal pain Patient with urinalysis that shows greater than 50 blood cells and 21-50 white blood cells with rare bacteria noted.  There is CT findings for hydroureteronephrosis with perinephric and periureteral stranding.  Multiple new punctate obstructive left renal oculi noted. Patient is not febrile here.  He does not have an elevated white blood cell count. Urine is to be cultured. Plan antibiotics Care discussed with Dr. Ronne Binning on-call for urology.  He advises Macrobid. I have discussed the findings with the patient and advised her of return precautions and need for follow-up and she voices understanding.       Final Clinical Impression(s) / ED Diagnoses Final diagnoses:  Left flank pain  Kidney stone on left side  Urinary tract infection with hematuria, site unspecified    Rx / DC Orders ED Discharge Orders     None         Margarita Grizzle, MD 09/25/23 1043

## 2023-09-25 NOTE — Discharge Instructions (Addendum)
 Please take antibiotics as prescribed Return to the emergency department if you are having worsening symptoms, fever, chills, inability to tolerate medications. Please follow-up with your urologist as soon as possible.

## 2023-09-25 NOTE — ED Notes (Signed)
 Called Lab and added on Urine Culture

## 2023-09-26 LAB — URINE CULTURE: Culture: NO GROWTH

## 2023-12-16 ENCOUNTER — Other Ambulatory Visit: Payer: Self-pay | Admitting: Urology

## 2023-12-22 ENCOUNTER — Encounter (HOSPITAL_COMMUNITY): Payer: Self-pay | Admitting: Urology

## 2023-12-22 NOTE — Progress Notes (Signed)
 Spoke w/ via phone for pre-op interview--- [facility]adan Lab needs dos----  BMP per anesthesia.       Lab results------Current EKG in Epic dated 09/20/23. COVID test -----patient states asymptomatic no test needed Arrive at -------0700 NPO after MN NO Solid Food.   Pre-Surgery Ensure or G2:  Med rec completed Medications to take morning of surgery -----Norvasc  Diabetic medication -----  GLP1 agonist last dose: GLP1 instructions:  Patient instructed no nail polish to be worn day of surgery Patient instructed to bring photo id and insurance card day of surgery Patient aware to have Driver (ride ) / caregiver    for 24 hours after surgery - Fiance Andree Gearing Patient Special Instructions ----- shower with antibacterial soap. Pre-Op special Instructions -----  Patient verbalized understanding of instructions that were given at this phone interview. Patient denies chest pain, sob, fever, cough at the interview.

## 2023-12-28 ENCOUNTER — Encounter (HOSPITAL_COMMUNITY): Payer: Self-pay | Admitting: Urology

## 2023-12-28 ENCOUNTER — Other Ambulatory Visit: Payer: Self-pay

## 2023-12-28 ENCOUNTER — Ambulatory Visit (HOSPITAL_COMMUNITY): Admitting: Certified Registered"

## 2023-12-28 ENCOUNTER — Ambulatory Visit (HOSPITAL_COMMUNITY)
Admission: RE | Admit: 2023-12-28 | Discharge: 2023-12-28 | Disposition: A | Discharge status: Home / Self Care | Attending: Urology | Admitting: Urology

## 2023-12-28 ENCOUNTER — Encounter (HOSPITAL_COMMUNITY)
Admission: RE | Disposition: A | Discharge status: Home / Self Care | Payer: Self-pay | Source: Home / Self Care | Attending: Urology

## 2023-12-28 DIAGNOSIS — Z87891 Personal history of nicotine dependence: Secondary | ICD-10-CM | POA: Diagnosis not present

## 2023-12-28 DIAGNOSIS — I10 Essential (primary) hypertension: Secondary | ICD-10-CM | POA: Insufficient documentation

## 2023-12-28 DIAGNOSIS — N393 Stress incontinence (female) (male): Secondary | ICD-10-CM | POA: Diagnosis present

## 2023-12-28 DIAGNOSIS — Z8546 Personal history of malignant neoplasm of prostate: Secondary | ICD-10-CM | POA: Insufficient documentation

## 2023-12-28 DIAGNOSIS — G4730 Sleep apnea, unspecified: Secondary | ICD-10-CM | POA: Diagnosis not present

## 2023-12-28 DIAGNOSIS — Z87442 Personal history of urinary calculi: Secondary | ICD-10-CM | POA: Diagnosis not present

## 2023-12-28 DIAGNOSIS — Z9079 Acquired absence of other genital organ(s): Secondary | ICD-10-CM | POA: Diagnosis not present

## 2023-12-28 HISTORY — PX: URETHRAL SLING: SHX2621

## 2023-12-28 LAB — POCT I-STAT, CHEM 8
BUN: 8 mg/dL (ref 6–20)
Calcium, Ion: 1.27 mmol/L (ref 1.15–1.40)
Chloride: 101 mmol/L (ref 98–111)
Creatinine, Ser: 1.1 mg/dL (ref 0.61–1.24)
Glucose, Bld: 94 mg/dL (ref 70–99)
HCT: 48 % (ref 39.0–52.0)
Hemoglobin: 16.3 g/dL (ref 13.0–17.0)
Potassium: 3.9 mmol/L (ref 3.5–5.1)
Sodium: 143 mmol/L (ref 135–145)
TCO2: 28 mmol/L (ref 22–32)

## 2023-12-28 SURGERY — CREATION, URETHRAL SLING, MALE
Anesthesia: General | Site: Perineum

## 2023-12-28 MED ORDER — OXYCODONE HCL 5 MG PO TABS
ORAL_TABLET | ORAL | Status: AC
  Filled 2023-12-28: qty 1

## 2023-12-28 MED ORDER — EPHEDRINE 5 MG/ML INJ
INTRAVENOUS | Status: AC
  Filled 2023-12-28: qty 5

## 2023-12-28 MED ORDER — BUPIVACAINE HCL (PF) 0.5 % IJ SOLN
INTRAMUSCULAR | Status: DC | PRN
  Administered 2023-12-28: 5 mL

## 2023-12-28 MED ORDER — SODIUM CHLORIDE 0.9 % IR SOLN
Status: DC | PRN
  Administered 2023-12-28: 1000 mL via INTRAVESICAL

## 2023-12-28 MED ORDER — CEFAZOLIN SODIUM-DEXTROSE 2-4 GM/100ML-% IV SOLN
2.0000 g | INTRAVENOUS | Status: AC
  Administered 2023-12-28: 2 g via INTRAVENOUS

## 2023-12-28 MED ORDER — LIDOCAINE HCL (PF) 1 % IJ SOLN
INTRAMUSCULAR | Status: DC | PRN
  Administered 2023-12-28: 5 mL

## 2023-12-28 MED ORDER — FENTANYL CITRATE (PF) 250 MCG/5ML IJ SOLN
INTRAMUSCULAR | Status: AC
  Filled 2023-12-28: qty 5

## 2023-12-28 MED ORDER — 0.9 % SODIUM CHLORIDE (POUR BTL) OPTIME
TOPICAL | Status: DC | PRN
Start: 2023-12-28 — End: 2023-12-28
  Administered 2023-12-28: 1000 mL

## 2023-12-28 MED ORDER — EPHEDRINE SULFATE-NACL 50-0.9 MG/10ML-% IV SOSY
PREFILLED_SYRINGE | INTRAVENOUS | Status: DC | PRN
  Administered 2023-12-28: 7.5 mg via INTRAVENOUS
  Administered 2023-12-28: 5 mg via INTRAVENOUS

## 2023-12-28 MED ORDER — ACETAMINOPHEN 500 MG PO TABS: 1000.0000 mg | ORAL_TABLET | Freq: Three times a day (TID) | ORAL | 0 refills | Status: AC

## 2023-12-28 MED ORDER — FENTANYL CITRATE (PF) 100 MCG/2ML IJ SOLN
25.0000 ug | INTRAMUSCULAR | Status: DC | PRN
  Administered 2023-12-28 (×2): 25 ug via INTRAVENOUS

## 2023-12-28 MED ORDER — CEFAZOLIN SODIUM-DEXTROSE 2-4 GM/100ML-% IV SOLN
INTRAVENOUS | Status: AC
  Filled 2023-12-28: qty 100

## 2023-12-28 MED ORDER — MIDAZOLAM HCL 2 MG/2ML IJ SOLN
INTRAMUSCULAR | Status: AC
  Filled 2023-12-28: qty 2

## 2023-12-28 MED ORDER — BUPIVACAINE HCL (PF) 0.5 % IJ SOLN
INTRAMUSCULAR | Status: AC
Start: 2023-12-28 — End: 2023-12-28
  Filled 2023-12-28: qty 30

## 2023-12-28 MED ORDER — GENTAMICIN SULFATE 40 MG/ML IJ SOLN
5.0000 mg/kg | INTRAVENOUS | Status: AC
  Administered 2023-12-28: 450 mg via INTRAVENOUS
  Filled 2023-12-28: qty 11.25

## 2023-12-28 MED ORDER — OXYCODONE HCL 5 MG PO TABS: 5.0000 mg | ORAL_TABLET | Freq: Four times a day (QID) | ORAL | 0 refills | Status: AC | PRN

## 2023-12-28 MED ORDER — LIDOCAINE HCL (PF) 1 % IJ SOLN
INTRAMUSCULAR | Status: AC
  Filled 2023-12-28: qty 30

## 2023-12-28 MED ORDER — FENTANYL CITRATE (PF) 250 MCG/5ML IJ SOLN
INTRAMUSCULAR | Status: DC | PRN
  Administered 2023-12-28: 50 ug via INTRAVENOUS
  Administered 2023-12-28: 25 ug via INTRAVENOUS
  Administered 2023-12-28: 50 ug via INTRAVENOUS

## 2023-12-28 MED ORDER — CHLORHEXIDINE GLUCONATE 4 % EX SOLN: Freq: Once | CUTANEOUS | Status: DC

## 2023-12-28 MED ORDER — CHLORHEXIDINE GLUCONATE 0.12 % MT SOLN
OROMUCOSAL | Status: DC
Start: 2023-12-28 — End: 2023-12-28
  Filled 2023-12-28: qty 15

## 2023-12-28 MED ORDER — CELECOXIB 200 MG PO CAPS: 200.0000 mg | ORAL_CAPSULE | Freq: Two times a day (BID) | ORAL | 1 refills | Status: DC

## 2023-12-28 MED ORDER — CHLORHEXIDINE GLUCONATE 0.12 % MT SOLN
15.0000 mL | Freq: Once | OROMUCOSAL | Status: AC
  Administered 2023-12-28: 15 mL via OROMUCOSAL

## 2023-12-28 MED ORDER — HEMOSTATIC AGENTS (NO CHARGE) OPTIME
TOPICAL | Status: DC | PRN
  Administered 2023-12-28: 1 via TOPICAL

## 2023-12-28 MED ORDER — ORAL CARE MOUTH RINSE: 15.0000 mL | Freq: Once | OROMUCOSAL | Status: AC

## 2023-12-28 MED ORDER — LIDOCAINE 2% (20 MG/ML) 5 ML SYRINGE
INTRAMUSCULAR | Status: AC
  Filled 2023-12-28: qty 5

## 2023-12-28 MED ORDER — ACETAMINOPHEN 10 MG/ML IV SOLN: 1000.0000 mg | Freq: Once | INTRAVENOUS | Status: DC | PRN

## 2023-12-28 MED ORDER — SULFAMETHOXAZOLE-TRIMETHOPRIM 800-160 MG PO TABS: 1.0000 | ORAL_TABLET | Freq: Two times a day (BID) | ORAL | 0 refills | Status: DC

## 2023-12-28 MED ORDER — DEXAMETHASONE SODIUM PHOSPHATE 10 MG/ML IJ SOLN
INTRAMUSCULAR | Status: AC
  Filled 2023-12-28: qty 1

## 2023-12-28 MED ORDER — OXYCODONE HCL 5 MG/5ML PO SOLN: 5.0000 mg | Freq: Once | ORAL | Status: AC | PRN

## 2023-12-28 MED ORDER — PROPOFOL 10 MG/ML IV BOLUS
INTRAVENOUS | Status: DC | PRN
  Administered 2023-12-28: 200 mg via INTRAVENOUS

## 2023-12-28 MED ORDER — STERILE WATER FOR IRRIGATION IR SOLN
Status: DC | PRN
  Administered 2023-12-28: 1000 mL

## 2023-12-28 MED ORDER — PROPOFOL 10 MG/ML IV BOLUS
INTRAVENOUS | Status: AC
  Filled 2023-12-28: qty 20

## 2023-12-28 MED ORDER — ONDANSETRON HCL 4 MG/2ML IJ SOLN
INTRAMUSCULAR | Status: DC | PRN
  Administered 2023-12-28: 4 mg via INTRAVENOUS

## 2023-12-28 MED ORDER — LACTATED RINGERS IV SOLN: INTRAVENOUS | Status: DC

## 2023-12-28 MED ORDER — LIDOCAINE 2% (20 MG/ML) 5 ML SYRINGE
INTRAMUSCULAR | Status: DC | PRN
  Administered 2023-12-28: 60 mg via INTRAVENOUS

## 2023-12-28 MED ORDER — DEXAMETHASONE SODIUM PHOSPHATE 10 MG/ML IJ SOLN
INTRAMUSCULAR | Status: DC | PRN
  Administered 2023-12-28: 10 mg via INTRAVENOUS

## 2023-12-28 MED ORDER — MIDAZOLAM HCL 2 MG/2ML IJ SOLN
INTRAMUSCULAR | Status: DC | PRN
  Administered 2023-12-28: 2 mg via INTRAVENOUS

## 2023-12-28 MED ORDER — ONDANSETRON HCL 4 MG/2ML IJ SOLN
INTRAMUSCULAR | Status: AC
  Filled 2023-12-28: qty 2

## 2023-12-28 MED ORDER — OXYCODONE HCL 5 MG PO TABS
5.0000 mg | ORAL_TABLET | Freq: Once | ORAL | Status: AC | PRN
  Administered 2023-12-28: 5 mg via ORAL

## 2023-12-28 MED ORDER — FENTANYL CITRATE (PF) 100 MCG/2ML IJ SOLN
INTRAMUSCULAR | Status: AC
  Filled 2023-12-28: qty 2

## 2023-12-28 MED ORDER — DROPERIDOL 2.5 MG/ML IJ SOLN: 0.6250 mg | Freq: Once | INTRAMUSCULAR | Status: DC | PRN

## 2023-12-28 SURGICAL SUPPLY — 48 items
BAG URINE DRAIN 2000ML AR STRL (UROLOGICAL SUPPLIES) ×1 IMPLANT
BLADE SURG 15 STRL LF DISP TIS (BLADE) ×1 IMPLANT
BNDG GAUZE DERMACEA FLUFF 4 (GAUZE/BANDAGES/DRESSINGS) ×1 IMPLANT
BRIEF MESH DISP LRG (UNDERPADS AND DIAPERS) ×1 IMPLANT
CATH FOLEY 2WAY SLVR 5CC 14FR (CATHETERS) ×1 IMPLANT
CATH TIEMANN FOLEY 18FR 5CC (CATHETERS) ×1 IMPLANT
CHLORAPREP W/TINT 26 (MISCELLANEOUS) ×2 IMPLANT
COVER BACK TABLE 60X90IN (DRAPES) ×1 IMPLANT
COVER MAYO STAND STRL (DRAPES) ×1 IMPLANT
DERMABOND ADVANCED .7 DNX12 (GAUZE/BANDAGES/DRESSINGS) ×2 IMPLANT
DRAPE UNDERBUTTOCKS STRL (DISPOSABLE) ×1 IMPLANT
DRSG TELFA 3X8 NADH STRL (GAUZE/BANDAGES/DRESSINGS) ×1 IMPLANT
GAUZE 4X4 16PLY ~~LOC~~+RFID DBL (SPONGE) IMPLANT
GAUZE SPONGE 4X4 12PLY STRL (GAUZE/BANDAGES/DRESSINGS) IMPLANT
GLOVE BIO SURGEON STRL SZ7 (GLOVE) ×1 IMPLANT
GLOVE BIOGEL PI IND STRL 7.0 (GLOVE) ×1 IMPLANT
GOWN STRL REUS W/ TWL XL LVL3 (GOWN DISPOSABLE) ×1 IMPLANT
HEMOSTAT ARISTA ABSORB 3G PWDR (HEMOSTASIS) IMPLANT
HOLDER FOLEY CATH W/STRAP (MISCELLANEOUS) ×1 IMPLANT
KIT BASIN OR (CUSTOM PROCEDURE TRAY) ×1 IMPLANT
KIT TURNOVER KIT B (KITS) ×1 IMPLANT
LUBRICANT JELLY ST 5GR 8946 (MISCELLANEOUS) ×1 IMPLANT
NDL HYPO 22X1.5 SAFETY MO (MISCELLANEOUS) ×1 IMPLANT
NDL SPNL 22GX3.5 QUINCKE BK (NEEDLE) ×1 IMPLANT
NEEDLE HYPO 22X1.5 SAFETY MO (MISCELLANEOUS) ×1 IMPLANT
NEEDLE SPNL 22GX3.5 QUINCKE BK (NEEDLE) ×2 IMPLANT
PENCIL SMOKE EVACUATOR (MISCELLANEOUS) ×1 IMPLANT
PLUG CATH AND CAP STRL 200 (CATHETERS) ×1 IMPLANT
PROTECTOR NERVE ULNAR (MISCELLANEOUS) ×1 IMPLANT
RETRACTOR DEEP SCROTAL PENILE (MISCELLANEOUS) IMPLANT
SET IRRIG Y TYPE TUR BLADDER L (SET/KITS/TRAYS/PACK) ×1 IMPLANT
SHEET LAVH (DRAPES) ×1 IMPLANT
SLING ADVANCE MALE SYSTEM (Mesh General) IMPLANT
SPIKE FLUID TRANSFER (MISCELLANEOUS) IMPLANT
STAPLER SKIN PROX 35W (STAPLE) ×1 IMPLANT
SUT MNCRL AB 4-0 PS2 18 (SUTURE) ×1 IMPLANT
SUT SILK 2 0 30 PSL (SUTURE) IMPLANT
SUT VIC AB 2-0 SH 27X BRD (SUTURE) IMPLANT
SUT VIC AB 3-0 SH 27XBRD (SUTURE) ×4 IMPLANT
SUT VIC AB 4-0 PS2 27 (SUTURE) IMPLANT
SUT VIC AB 4-0 RB1 27XBRD (SUTURE) IMPLANT
SYR 10ML LL (SYRINGE) ×1 IMPLANT
SYR 20ML LL LF (SYRINGE) ×1 IMPLANT
SYR BULB IRRIG 60ML STRL (SYRINGE) ×1 IMPLANT
TOWEL GREEN STERILE (TOWEL DISPOSABLE) ×1 IMPLANT
TUBE CONNECTING 12X1/4 (SUCTIONS) IMPLANT
TUBING CONNECTING 10 (TUBING) ×1 IMPLANT
YANKAUER SUCT BULB TIP NO VENT (SUCTIONS) ×1 IMPLANT

## 2023-12-28 NOTE — H&P (Signed)
 H&P  History of Present Illness: Marcus Pruitt is a 53 y.o. 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 Avram, adenomatous polyp, repeat in 3 yrs    CYSTOSCOPY W/ RETROGRADES Left 09/22/2023   Procedure: CYSTOSCOPY, WITH RETROGRADE PYELOGRAM;  Surgeon: Alvaro Ricardo KATHEE Mickey., MD;  Location: [facility] ORS;  Service: Urology;  Laterality: Left;   CYSTOSCOPY WITH RETROGRADE PYELOGRAM, URETEROSCOPY AND STENT PLACEMENT Bilateral 02/03/2019   Procedure: CYSTOSCOPY WITH RETROGRADE PYELOGRAM, URETEROSCOPY AND STENT PLACEMENT;  Surgeon: Alvaro Ricardo, MD;  Location: [facility] ORS;  Service: Urology;  Laterality: Bilateral;  75 MINS   CYSTOSCOPY/URETEROSCOPY/HOLMIUM LASER/STENT PLACEMENT Left 09/22/2023   Procedure: CYSTOSCOPY/URETEROSCOPY/HOLMIUM LASER/STENT PLACEMENT;  Surgeon: Alvaro Ricardo KATHEE Mickey., MD;  Location: [facility] ORS;  Service: Urology;   Laterality: Left;   GIVENS CAPSULE STUDY N/A 08/26/2017   Procedure: GIVENS CAPSULE STUDY;  Surgeon: Avram Lupita BRAVO, MD;  Location: [facility] ENDOSCOPY;  Service: Endoscopy;  Laterality: N/A;   HOLMIUM LASER APPLICATION Bilateral 02/03/2019   Procedure: HOLMIUM LASER APPLICATION;  Surgeon: Alvaro Ricardo, MD;  Location: [facility] ORS;  Service: Urology;  Laterality: Bilateral;   INGUINAL HERNIA REPAIR Left 02/21/2016   Procedure: LAPAROSCOPIC INGUINAL HERNIA;  Surgeon: Ricardo Alvaro, MD;  Location: [facility] ORS;  Service: Urology;  Laterality: Left;   LYMPHADENECTOMY Bilateral 02/21/2016   Procedure: PELVIC LYMPHADENECTOMY;  Surgeon: Ricardo Alvaro, MD;  Location: [facility] ORS;  Service: Urology;  Laterality: Bilateral;   MANDIBLE SURGERY  1990   MESENTERIC ARTERY BYPASS N/A 05/15/2017   Procedure: OPEN FENESTRATION SUPERIOR MESENTERIC ARTERY;  Surgeon: Laurence Redell CROME, MD;  Location: [facility] OR;  Service: Vascular;  Laterality: N/A;   PATCH ANGIOPLASTY N/A 05/15/2017   Procedure: SUPERIOR MESENTERIC ARTERY PATCH ANGIOPLASTY USING PERI-GUARD PATCH;  Surgeon: Laurence Redell CROME, MD;  Location: [facility] OR;  Service: Vascular;  Laterality: N/A;   PERCUTANEOUS VENOUS THROMBECTOMY,LYSIS WITH INTRAVASCULAR ULTRASOUND (IVUS) N/A 05/15/2017   Procedure: THROMBECTOMY OF SUPERIOR MESENTERIC ARTERY;  Surgeon: Laurence Redell CROME, MD;  Location: [facility] OR;  Service: Vascular;  Laterality: N/A;   ROBOT ASSISTED LAPAROSCOPIC RADICAL PROSTATECTOMY N/A 02/21/2016   Procedure: XI ROBOTIC ASSISTED LAPAROSCOPIC RADICAL PROSTATECTOMY;  Surgeon: Ricardo Alvaro, MD;  Location: [facility] ORS;  Service: Urology;  Laterality: N/A;   UMBILICAL HERNIA REPAIR  02/21/2016   Procedure: LAPAROSCOPIC UMBILICAL HERNIA;  Surgeon: Ricardo Likens, MD;  Location: [facility] ORS;  Service: Urology;;   VINCENZO BILE  07/29/2017   Procedure: Visceral Angiogram;  Surgeon: Laurence Redell CROME, MD;  Location: [facility] INVASIVE CV LAB;  Service: Cardiovascular;;    Home Medications:  Current Meds  Medication Sig    allopurinol  (ZYLOPRIM ) 100 MG tablet TAKE 1 TABLET BY MOUTH TWICE A DAY   amLODipine  (NORVASC ) 5 MG tablet TAKE 1 TABLET BY MOUTH EVERY DAY   colchicine  0.6 MG tablet TAKE 1 TABLET BY MOUTH EVERY DAY (Patient taking differently: Take 0.6 mg by mouth daily as needed (GOUT FLARE).)    Allergies:  Allergies  Allergen Reactions   Bee Venom Anaphylaxis and Shortness Of Breath    SOB, , hand swelled up after getting stung on her hand    Family History  Problem Relation Age of Onset   Heart disease Maternal Grandmother    Diabetes Other        fhx   Prostate cancer Father    Hypertension Mother    Stroke Mother    Colon cancer Neg Hx    Colon polyps Neg Hx    Esophageal cancer Neg Hx    Rectal cancer Neg Hx    Stomach cancer Neg Hx     Social History:  reports that he quit smoking about 16 years ago. His smoking use included cigarettes. He has never been exposed to tobacco smoke. He has never used smokeless tobacco. He reports that he does not currently use alcohol. He reports that he does not use drugs.  ROS: A complete review of systems was performed.  All systems are negative except for pertinent findings as noted.  Physical Exam:  Vital signs in last 24 hours: Temp:  [97.9 F (36.6 C)] 97.9 F (36.6 C) (07/01 0728) Pulse Rate:  [67] 67 (07/01 0728) Resp:  [18] 18 (07/01 0728) BP: (146)/(103) 146/103 (07/01 0732) Weight:  [89.8 kg] 89.8 kg (07/01 0728) Constitutional:  Alert and oriented, No acute distress Cardiovascular: Regular rate and rhythm Respiratory: Normal respiratory effort, Lungs clear bilaterally GI: Abdomen is soft, nontender, nondistended, no abdominal masses Lymphatic: No lymphadenopathy Neurologic: Grossly intact, no focal deficits Psychiatric: Normal mood and affect   Laboratory Data:  Recent Labs    12/28/23 0855  HGB 16.3  HCT 48.0    Recent Labs    12/28/23 0855  NA 143  K 3.9  CL 101  GLUCOSE 94  BUN 8  CREATININE 1.10      Results for orders placed or performed during the hospital encounter of 12/28/23 (from the past 24 hours)  I-STAT, chem 8     Status: None   Collection Time: 12/28/23  8:55 AM  Result Value Ref Range   Sodium 143 135 - 145 mmol/L   Potassium 3.9 3.5 - 5.1 mmol/L   Chloride 101 98 - 111 mmol/L   BUN 8 6 - 20 mg/dL   Creatinine, Ser 8.89 0.61 - 1.24 mg/dL   Glucose, Bld 94 70 - 99 mg/dL   Calcium, Ion 8.72 8.84 - 1.40 mmol/L   TCO2 28 22 - 32 mmol/L   Hemoglobin 16.3 13.0 - 17.0 g/dL   HCT 51.9 60.9 - 47.9 %   No results found for this or any previous visit (from the past 240 hours).  Renal Function: Recent Labs    12/28/23 0855  CREATININE  1.10   Estimated Creatinine Clearance: 83 mL/min (by C-G formula based on SCr of 1.1 mg/dL).  Radiologic Imaging: No results found.  Assessment:  Marcus Pruitt is a 53 y.o. year old M with SUI after prostatectomy  Plan:  To OR as planned for sling. Procedure and risks reviewed, including but not limited to bleeding, infection, implant infection, implant malfunction, implant malplacement, erosion, damage to adjacent structures, pain, urinary retention, failure to improve or make dry. All questions answered   Herlene Foot, MD 12/28/2023, 8:57 AM  [facility] Pager: [phone]

## 2023-12-28 NOTE — Discharge Instructions (Addendum)
 Male Sling Post Operative Instructions Dr. Herlene Foot  You may notice some swelling or black and blue bruising. This is very common, and may increase slightly over the next several days. Typically it will begin to improve 1-2 weeks after surgery You may take a shower 48 hours after surgery. Avoid submerging yourself completely in water  (bath tubs, hot tubs, swimming pools, etc) until 1 month after the surgery. To clean the incision, let soapy water  gently wash over the area and pat lightly. Use supportive, tight-fitting underwear for the first two weeks after surgery. For example, jock straps, sliding shorts, or briefs Apply ice packs 20 minutes on/20 minutes off for at least the first 2 days following surgery. This will help  minimize swelling and discomfort. After the first few days you can continue using ice packs if they are helpful  The skin incision is closed with sutures and purple skin glue. Both of these things dissolve on their own over the course of several weeks.   Avoid lifting anything heavier than 15 pounds for 1 month Do not put any direct pressure on the incision for long periods of time for the first 6 weeks following surgery. For example, do not ride a bicycle, motorcycle, ATV, or horse.  Sitting on a soft cushion, donut cushion, or pillow can help with the discomfort Avoid all sexual contact for 2 weeks following the surgery. You will be prescribed an antibiotic following the surgery; please take this as prescribed.  For pain post-operatively, you will typically be prescribed 2 medicines. The first is an anti-inflammatory medication (Celebrex , Toradol , Meloxicam). The second is narcotic pain medication (Tramadol, Oxycodone ). You can take these as needed, and can supplement with over the counter tylenol . I recommend limiting the amount of narcotic pain medication you take as these medications can cause constipation and in rare cases, addiction.               Post  Anesthesia Home Care Instructions  Activity: Get plenty of rest for the remainder of the day. A responsible individual must stay with you for 24 hours following the procedure.  For the next 24 hours, DO NOT: -Drive a car -Advertising copywriter -Drink alcoholic beverages -Take any medication unless instructed by your physician -Make any legal decisions or sign important papers.  Meals: Start with liquid foods such as gelatin or soup. Progress to regular foods as tolerated. Avoid greasy, spicy, heavy foods. If nausea and/or vomiting occur, drink only clear liquids until the nausea and/or vomiting subsides. Call your physician if vomiting continues.  Special Instructions/Symptoms: Your throat may feel dry or sore from the anesthesia or the breathing tube placed in your throat during surgery. If this causes discomfort, gargle with warm salt water . The discomfort should disappear within 24 hours.

## 2023-12-28 NOTE — Transfer of Care (Signed)
 Immediate Anesthesia Transfer of Care Note  Patient: Marcus Pruitt  Procedure(s) Performed: CREATION, URETHRAL SLING, MALE (Perineum)  Patient Location: PACU  Anesthesia Type:General  Level of Consciousness: awake, alert , and oriented  Airway & Oxygen Therapy: Patient Spontanous Breathing and Patient connected to face mask oxygen  Post-op Assessment: Report given to RN and Post -op Vital signs reviewed and stable  Post vital signs: Reviewed and stable  Last Vitals:  Vitals Value Taken Time  BP 161/100 12/28/23 11:08  Temp    Pulse 85 12/28/23 11:10  Resp 12 12/28/23 11:10  SpO2 98 % 12/28/23 11:10  Vitals shown include unfiled device data.  Last Pain:  Vitals:   12/28/23 0728  TempSrc: Oral  PainSc: 0-No pain      Patients Stated Pain Goal: 4 (12/28/23 0728)  Complications: No notable events documented.

## 2023-12-28 NOTE — Anesthesia Procedure Notes (Signed)
 Procedure Name: LMA Insertion Date/Time: 12/28/2023 9:22 AM  Performed by: Worth Catherene Flores, CRNAPre-anesthesia Checklist: Patient identified, Emergency Drugs available, Suction available and Patient being monitored Patient Re-evaluated:Patient Re-evaluated prior to induction Oxygen Delivery Method: Circle system utilized Preoxygenation: Pre-oxygenation with 100% oxygen Induction Type: IV induction Ventilation: Mask ventilation without difficulty LMA: LMA inserted LMA Size: 4.0 Tube type: Oral Number of attempts: 1 Placement Confirmation: positive ETCO2 and breath sounds checked- equal and bilateral Tube secured with: Tape Dental Injury: Teeth and Oropharynx as per pre-operative assessment

## 2023-12-28 NOTE — Anesthesia Preprocedure Evaluation (Addendum)
 Anesthesia Evaluation  Patient identified by MRN, date of birth, ID band Patient awake    Reviewed: Allergy & Precautions, NPO status , Patient's Chart, lab work & pertinent test results  Airway Mallampati: II  TM Distance: >3 FB Neck ROM: Full    Dental  (+) Teeth Intact, Dental Advisory Given   Pulmonary sleep apnea and Continuous Positive Airway Pressure Ventilation , former smoker   breath sounds clear to auscultation       Cardiovascular hypertension, Pt. on medications + Peripheral Vascular Disease   Rhythm:Regular Rate:Normal     Neuro/Psych  PSYCHIATRIC DISORDERS Anxiety Depression    negative neurological ROS     GI/Hepatic negative GI ROS, Neg liver ROS,,,  Endo/Other  negative endocrine ROS    Renal/GU Renal disease     Musculoskeletal negative musculoskeletal ROS (+)    Abdominal   Peds  Hematology  (+) Blood dyscrasia, anemia   Anesthesia Other Findings   Reproductive/Obstetrics                             Anesthesia Physical Anesthesia Plan  ASA: 2  Anesthesia Plan: General   Post-op Pain Management: Tylenol  PO (pre-op)*   Induction: Intravenous  PONV Risk Score and Plan: 3 and Ondansetron , Dexamethasone  and Midazolam   Airway Management Planned: Oral ETT and LMA  Additional Equipment: None  Intra-op Plan:   Post-operative Plan: Extubation in OR  Informed Consent: I have reviewed the patients History and Physical, chart, labs and discussed the procedure including the risks, benefits and alternatives for the proposed anesthesia with the patient or authorized representative who has indicated his/her understanding and acceptance.     Dental advisory given  Plan Discussed with: CRNA  Anesthesia Plan Comments:        Anesthesia Quick Evaluation

## 2023-12-28 NOTE — Progress Notes (Signed)
 Wasted 50 mcg of fentanyl  in proper container in medication room, patient removed from pyxis before med could be wasted in pyxis. Toni L. RN witness.

## 2023-12-28 NOTE — Anesthesia Postprocedure Evaluation (Signed)
 Anesthesia Post Note  Patient: Marcus Pruitt  Procedure(s) Performed: CREATION, URETHRAL SLING, MALE (Perineum)     Patient location during evaluation: PACU Anesthesia Type: General Level of consciousness: awake and alert Pain management: pain level controlled Vital Signs Assessment: post-procedure vital signs reviewed and stable Respiratory status: spontaneous breathing, nonlabored ventilation, respiratory function stable and patient connected to nasal cannula oxygen Cardiovascular status: blood pressure returned to baseline and stable Postop Assessment: no apparent nausea or vomiting Anesthetic complications: no  No notable events documented.  Last Vitals:  Vitals:   12/28/23 1145 12/28/23 1200  BP: (!) 141/104 (!) 144/97  Pulse: 76 83  Resp: 12 10  Temp:    SpO2: 90% 94%    Last Pain:  Vitals:   12/28/23 1210  TempSrc:   PainSc: 5                  Franky JONETTA Bald

## 2023-12-28 NOTE — Op Note (Signed)
 Preoperative diagnosis:  1. Stress urinary incontinence 2. History of prostate cancer s/p prostatectomy  Postoperative diagnosis: same  Procedure(s): 1. Insertion of male sling (advance XP) 2. Flexible cystoscopy  Surgeon: Dr. Herlene Foot  Assistant: Jacqulyn Bound MD  Anesthesia: General  Complications: none  EBL: 75 cc  Intraoperative findings: Good bulbar urethra displacement and urethral coaptation after placement and tightening of sling  Indication: Marcus Pruitt is a 53 y.o.yo M who with stress incontinence s/p prostatectomy. After an extensive discussion, he is interested in proceeding with a sling  Description of procedure:  After appropriate consent had been obtained, the patient was brought to the operative suite where anesthesia was induced. The patient was prepped and draped in the usual sterile fashion. Extra care was taken with leg positioning to minimize the risk of compartment syndrome neuropathy and deep vein thrombosis.  Preoperative antibiotics were given.  I an made approximately 5 cm incision in the perineum involving the lower aspect of the scrotum.  I dissected down through soft tissue and mobilized the subcutaneous tissue from the bulbospongiosus midline. The lonestar retractor was placed and used throughout for exposure. I then split the muscle in the midline with my usual technique. Using a curved cerebellar retractor I exposed nicely the bulbar urethra.  It was easy to see and feel the perineal tendon that was sharply taken down with Metzenbaum scissors.  There was 2 to 4 cm of mobility of the bulbar urethra towards the patient's head.  I was very pleased with identification and mobilization. I placed a 3-0 Vicryl where the tendon was initially attached to the bulb  I was very diligent at the beginning of the case and throughout the case to feel the abductor tendon relative to the obturator foramen.  I marked the area of entrance of the foramen needle with  a marking pen.  I used 22 French foramen needle to find the inferior rami and then through the foramen itself below the tendon.  I kept the angle appropriate with the patient in Trendelenburg.  I made a scalpel incision on the patient's left side approximately 1 cm in length.  With appropriate exposure I placed my finger in the upper aspect of the triangle on the patient's left side.  With the described technique I passed a trocar initially placing it against the buttock at 45 degree angle.  With my thumb I did a double pop maneuver through the soft tissues layers and then dropping the handle and delivering the tip onto the pulp of my index finger on the patient's left side.  The foramen needle was easily passed and with correct orientation the mesh was attached.  I double checked the location of the needle and was excellent.  I gently rotated the trocar with the mesh sling coming back through the skin  I did the exact same procedure on the right.  The mesh was attached in correct orientation.  Gently pulling on both slings and with my finger between the bulb and the mesh I gently brought the mesh close to the bulbar urethra.  The 3-0 Vicryl that was initially placed through the sponge was brought out through the distal aspect of the mesh.  I then gently synched the mesh down to the bulb.  I placed two 3-0 Vicryl's in the proximal aspect of the mesh through the bulbospongiosus.  I then passed another in the midline.  I was very happy with the orientation of the mesh.  I then gently tensioned  the sling over the 18 Fr foley until it clam-shelled. I then inserted the cystoscope to examine. Ultimately I tensioned the sling just a little more and I was very happy with the coaptation of the urethra and stopped at that point. A 14 fr catheter was then inserted.  Visually and palpably there was excellent rotation of the bulbar urethra.   The sling was cut below each blue dot.  It had been rolled by my  assistant prior to help for its release.  I irrigated.  I placed a hemostat deeply across the silicone and white sheath removing them easily.  This was done on both sides.  The mesh was cut below the skin level in the inguinal creases.  I closed each inguinal incision with 2 interrupted 4-0 Vicryl followed by Dermabond.  A 4 layer closure was used for the perineum.  I was cognizant of the scrotal aspect of the closure with 3-0 Vicryl that also included reapproximation of the bulbospongiosus muscle.  The next layer was deeper subcutaneous suture with 3-0 Vicryl.  A third layer was close to the skin with level with 3-0 Vicryl.  4-0 running mattress sutures used for the perineum.  Dermabond was applied with fluff dressing  The Foley catheter was draining well at the end of the case.  Leg position was good. I was very pleased with the surgery and hopefully it reaches the patient's treatment goal  Herlene Foot MD 12/28/2023, 10:57 AM  [facility]  Pager: [phone]

## 2023-12-29 ENCOUNTER — Encounter (HOSPITAL_COMMUNITY): Payer: Self-pay | Admitting: Urology

## 2024-01-26 ENCOUNTER — Other Ambulatory Visit: Payer: Self-pay | Admitting: Family Medicine

## 2024-02-09 ENCOUNTER — Ambulatory Visit: Admitting: Family Medicine

## 2024-02-09 ENCOUNTER — Encounter: Payer: Self-pay | Admitting: Family Medicine

## 2024-02-09 VITALS — BP 110/82 | HR 70 | Temp 97.6°F | Wt 198.0 lb

## 2024-02-09 DIAGNOSIS — M546 Pain in thoracic spine: Secondary | ICD-10-CM | POA: Diagnosis not present

## 2024-02-09 DIAGNOSIS — L723 Sebaceous cyst: Secondary | ICD-10-CM | POA: Diagnosis not present

## 2024-02-09 MED ORDER — METHYLPREDNISOLONE 4 MG PO TBPK
ORAL_TABLET | ORAL | 0 refills | Status: DC
Start: 2024-02-09 — End: 2024-04-27

## 2024-02-09 MED ORDER — CYCLOBENZAPRINE HCL 10 MG PO TABS: 10.0000 mg | ORAL_TABLET | Freq: Three times a day (TID) | ORAL | 1 refills | Status: DC | PRN

## 2024-02-09 NOTE — Progress Notes (Signed)
   Subjective:    Patient ID: Marcus Pruitt, male    DOB: 1971/05/30, 53 y.o.   MRN: [id number]  HPI Here for 2 issues. First he has a small lump in the left armpit that appeared 2 weeks ago. It is not painful but is slowly enlarging. The other issue is a sharp pain that appeared one week ago in his left upper back. No recent trauma. The pain is in a specific spot, no radiation to the arm. No spinal pain. Heat and Ibuprofen help a little.    Review of Systems  Constitutional: Negative.   Respiratory: Negative.    Cardiovascular: Negative.   Musculoskeletal:  Positive for myalgias.       Objective:   Physical Exam Constitutional:      Appearance: Normal appearance.  Cardiovascular:     Rate and Rhythm: Normal rate and regular rhythm.     Pulses: Normal pulses.     Heart sounds: Normal heart sounds.  Pulmonary:     Effort: Pulmonary effort is normal.     Breath sounds: Normal breath sounds.  Musculoskeletal:     Comments: He is tender in the left upper back just inferior to the scapula. No spinal tenderness. The spine has full ROM  Skin:    Comments: There is a small sebaceous cyst in the left axilla. I was able to express some material with gentle squeezing   Neurological:     Mental Status: He is alert.           Assessment & Plan:  The back pain seems to be from a pinched nerve. We will treat with a Medrol  dose pack and Flexeril . He also has a sebaceous cyst in the left axilla. He will squeeze this as needed, but he will return of it becomes painful.  Garnette Olmsted, MD

## 2024-02-13 ENCOUNTER — Encounter: Payer: Self-pay | Admitting: Family Medicine

## 2024-02-15 NOTE — Telephone Encounter (Signed)
 He should see Dr. Artist Lloyd again (he saw him for back pain in 2023)

## 2024-03-14 ENCOUNTER — Telehealth: Payer: Self-pay

## 2024-03-14 NOTE — Telephone Encounter (Signed)
 Copied from CRM [id number]. Topic: Clinical - Medication Question >> Mar 14, 2024 11:23 AM Deleta RAMAN wrote: Reason for CRM: patient would like to know if he would need to get medication for stomach issues while traveling to the [place] due to recent stomach issues and the food

## 2024-03-15 MED ORDER — CIPROFLOXACIN HCL 500 MG PO TABS
500.0000 mg | ORAL_TABLET | Freq: Two times a day (BID) | ORAL | 0 refills | Status: DC
Start: 2024-03-15 — End: 2024-04-27

## 2024-03-15 NOTE — Addendum Note (Signed)
 Addended by: JOHNNY SENIOR A on: 03/15/2024 07:58 AM   Modules accepted: Orders

## 2024-03-15 NOTE — Telephone Encounter (Signed)
 I sent in an order for 7 days of Cipro  to take if needed

## 2024-03-15 NOTE — Telephone Encounter (Signed)
 Pt.notified

## 2024-03-16 ENCOUNTER — Telehealth: Payer: Self-pay | Admitting: Internal Medicine

## 2024-03-16 ENCOUNTER — Other Ambulatory Visit: Payer: Self-pay | Admitting: Internal Medicine

## 2024-03-16 NOTE — Telephone Encounter (Signed)
 Inbound call from patient stating he is in need of a refill of cholestyramine ? Requesting a call to discuss. Please advise.

## 2024-03-17 MED ORDER — CHOLESTYRAMINE 4 GM/DOSE PO POWD: 4.0000 g | Freq: Every day | ORAL | 0 refills | Status: DC

## 2024-03-17 NOTE — Telephone Encounter (Signed)
 Inbound call from patient stating that the pharmacy has advise him that he needs to schedule an appointment in order to have more refill on his medication. Patient is requesting a refill on cholestryramine. Patient would like it send over to [facility] at [facility] located at [address] Please advise.

## 2024-03-17 NOTE — Telephone Encounter (Signed)
 Inbound call from patient requesting a call to discuss medication refill. Requesting for refill to be completed today. States he is currently at [facility]. Please advise, thank you

## 2024-03-17 NOTE — Telephone Encounter (Signed)
 90 day supply sent to pharmacy. Patient must keep appointment for additional refills.

## 2024-04-26 ENCOUNTER — Ambulatory Visit: Admitting: Neurology

## 2024-04-26 NOTE — Progress Notes (Unsigned)
 [facility]  PATIENT: Marcus Pruitt DOB: 1971-04-16  REFERRING DOCTOR OR PCP:  Jayson Devonshire, MD SOURCE: patient, notes form PCP  _________________________________   HISTORICAL  CHIEF COMPLAINT:  Chief Complaint  Patient presents with   RM 11    Consult: reports daily headaches, when first waking and again in the evening, pain behind eyes and tensity in the neck; OTC meds no longer working; alone    HISTORY OF PRESENT ILLNESS:  I had the pleasure of seeing your patient, Marcus Pruitt, at [facility] for neurologic consultation regarding his headaches.  He is a 53 year old man with HTN, gout, h/o prostate cancer, h/o mesentery artery rupture who has had a left occipital headache since late 2024.   Pain can radiate to behind the eye.   He wakes up with the headache.  Moving worsens the pain and rest and a dark quiet room help.  No nausea but he has photophobia.   He has taken Ibuprofen, Tylenol  without benefit now (though helped some last year).   He stopped these as ineffective.    He started meloxicam as he also had some back pain. He has some numbness in the left arm.   No major change in vision though sometimes he feels he takes a little longer to focus.  He has some insomnia, falling asleep easily but then waking up with pain.     He denies visual changes.  Last eye exam about one year ago and he plans on seeing someone in next 1-2 weeks.    Denies numbness weakness or gait disturbance.   Imaging: CT scan brain 12/27/2023 was interpreted as normal.    MRI of the thoracic spine 12/02/2021 showed disc bulging at T7-T8, T8-T9 and T9-T10.  There is mild spinal stenosis at T8-T9 and right foraminal narrowing at T9-T10.  There is no definite nerve root compression.  The spinal cord appears normal.   REVIEW OF SYSTEMS: Constitutional: No fevers, chills, sweats, or change in appetite Eyes: No visual changes, double vision, eye pain Ear, nose  and throat: No hearing loss, ear pain, nasal congestion, sore throat Cardiovascular: No chest pain, palpitations Respiratory:  No shortness of breath at rest or with exertion.   No wheezes GastrointestinaI: No nausea, vomiting, diarrhea, abdominal pain, fecal incontinence Genitourinary:  No dysuria, urinary retention or frequency.  No nocturia.SABRA  Has h/o prostatectomy.   Emmi penile implant and bladder sling.     Has had kidney stones  related to gout Musculoskeletal:  has neck pain, back pain.  Has gout Integumentary: No rash, pruritus, skin lesions Neurological: as above Psychiatric: No depression at this time.  No anxiety Endocrine: No palpitations, diaphoresis, change in appetite, change in weigh or increased thirst Hematologic/Lymphatic:  No anemia, purpura, petechiae. Allergic/Immunologic: No itchy/runny eyes, nasal congestion, recent allergic reactions, rashes  ALLERGIES: Allergies  Allergen Reactions   Bee Venom Anaphylaxis and Shortness Of Breath    SOB, , hand swelled up after getting stung on her hand    HOME MEDICATIONS:  Current Outpatient Medications:    acetaminophen  (TYLENOL ) 500 MG tablet, Take 2 tablets (1,000 mg total) by mouth every 8 (eight) hours., Disp: 50 tablet, Rfl: 0   allopurinol  (ZYLOPRIM ) 100 MG tablet, TAKE 1 TABLET BY MOUTH TWICE A DAY, Disp: 180 tablet, Rfl: 1   amLODipine  (NORVASC ) 5 MG tablet, TAKE 1 TABLET BY MOUTH EVERY DAY, Disp: 90 tablet, Rfl: 1   cholestyramine  (QUESTRAN ) 4 g packet, Take 1  packet by mouth daily., Disp: , Rfl:    colchicine  0.6 MG tablet, TAKE 1 TABLET BY MOUTH EVERY DAY (Patient taking differently: Take 0.6 mg by mouth daily as needed (GOUT FLARE).), Disp: 90 tablet, Rfl: 1   EPINEPHrine  (EPIPEN  2-PAK) 0.3 mg/0.3 mL IJ SOAJ injection, Inject 0.3 mLs (0.3 mg total) into the muscle as needed for anaphylaxis., Disp: 1 each, Rfl: 2   imipramine (TOFRANIL) 25 MG tablet, Take 1 tablet (25 mg total) by mouth at bedtime., Disp: 30  tablet, Rfl: 11   methylPREDNISolone  (MEDROL ) 4 MG tablet, Taper from 6 pills po for one day to 1 pill po the last day over 6 days, Disp: 21 tablet, Rfl: 0  PAST MEDICAL HISTORY: Past Medical History:  Diagnosis Date   Allergy    Anemia    Anxiety    Blood transfusion without reported diagnosis    Clotting disorder    RESOLVED NOW PER PT   Depression    H/O blood clots    mesenteric   Hearing loss in right ear    History of colon polyps    History of kidney stones    Hypertension    IBS (irritable bowel syndrome)    with diarrhea   Low back pain    Prostate cancer (HCC)    Sleep apnea    on CPAP   Small intestinal bacterial overgrowth 09/22/2017   + Breath test 08/2017   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 Avram, adenomatous polyp, repeat in 3 yrs    CYSTOSCOPY W/ RETROGRADES Left 09/22/2023   Procedure: CYSTOSCOPY, WITH RETROGRADE PYELOGRAM;  Surgeon: Alvaro Ricardo KATHEE Mickey., MD;  Location: [facility] ORS;  Service: Urology;  Laterality: Left;   CYSTOSCOPY WITH RETROGRADE PYELOGRAM, URETEROSCOPY AND STENT PLACEMENT Bilateral 02/03/2019   Procedure: CYSTOSCOPY WITH RETROGRADE PYELOGRAM, URETEROSCOPY AND STENT PLACEMENT;  Surgeon: Alvaro Ricardo, MD;  Location: [facility]S;  Service: Urology;  Laterality: Bilateral;  75 MINS   CYSTOSCOPY/URETEROSCOPY/HOLMIUM LASER/STENT PLACEMENT Left 09/22/2023   Procedure: CYSTOSCOPY/URETEROSCOPY/HOLMIUM LASER/STENT PLACEMENT;  Surgeon: Alvaro Ricardo KATHEE Mickey., MD;  Location: [facility];  Service: Urology;  Laterality: Left;   GIVENS CAPSULE STUDY N/A 08/26/2017   Procedure: GIVENS CAPSULE STUDY;  Surgeon: Avram Lupita BRAVO, MD;  Location[facility] ENDOSCOPY;  Service: Endoscopy;  Laterality: N/A;   HOLMIUM LASER APPLICATION Bilateral 02/03/2019   Procedure: HOLMIUM LASER APPLICATION;  Surgeon: Alvaro Ricardo, MD;  Location: [facility];  Service: Urology;  Laterality: Bilateral;   INGUINAL HERNIA REPAIR Left 02/21/2016   Procedure: LAPAROSCOPIC INGUINAL HERNIA;  Surgeon: Ricardo Alvaro, MD;  Location: [facility];  Service: Urology;  Laterality: Left;   LYMPHADENECTOMY Bilateral 02/21/2016   Procedure: PELVIC LYMPHADENECTOMY;  Surgeon: Ricardo Alvaro, MD;  Location: [facility];  Service: Urology;  Laterality: Bilateral;   MANDIBLE SURGERY  1990   MESENTERIC ARTERY BYPASS N/A 05/15/2017   Procedure: OPEN FENESTRATION SUPERIOR MESENTERIC ARTERY;  Surgeon: Laurence Redell CROME, MD;  Location: [facility] OR;  Service: Vascular;  Laterality: N/A;   PATCH ANGIOPLASTY N/A 05/15/2017   Procedure: SUPERIOR MESENTERIC ARTERY PATCH ANGIOPLASTY USING PERI-GUARD PATCH;  Surgeon: Laurence Redell CROME, MD;  Location: [facility] OR;  Service: Vascular;  Laterality: N/A;   PERCUTANEOUS VENOUS THROMBECTOMY,LYSIS WITH INTRAVASCULAR ULTRASOUND (IVUS) N/A 05/15/2017   Procedure: THROMBECTOMY OF SUPERIOR MESENTERIC ARTERY;  Surgeon: Laurence Redell CROME, MD;  Location: [facility] OR;  Service: Vascular;  Laterality: N/A;   ROBOT ASSISTED LAPAROSCOPIC RADICAL PROSTATECTOMY N/A 02/21/2016   Procedure: XI ROBOTIC ASSISTED LAPAROSCOPIC RADICAL PROSTATECTOMY;  Surgeon: Ricardo Likens, MD;  Location: [facility]S;  Service: Urology;  Laterality: N/A;   UMBILICAL HERNIA REPAIR  02/21/2016   Procedure: LAPAROSCOPIC UMBILICAL HERNIA;  Surgeon: Ricardo Likens, MD;  Location: [facility];  Service: Urology;;   PHILL GLADE N/A 12/28/2023   Procedure: CREATION, PHILL GLADE, MALE;  Surgeon: Lovie Arlyss CROME, MD;  Location: [facility] OR;  Service: Urology;  Laterality:  N/A;   VISCERAL ANGIOGRAM  07/29/2017   Procedure: Visceral Angiogram;  Surgeon: Laurence Redell CROME, MD;  Location: [facility] INVASIVE CV LAB;  Service: Cardiovascular;;    FAMILY HISTORY: Family History  Problem Relation Age of Onset   Heart disease Maternal Grandmother    Diabetes Other        fhx   Prostate cancer Father    Hypertension Mother    Stroke Mother    Colon cancer Neg Hx    Colon polyps Neg Hx    Esophageal cancer Neg Hx    Rectal cancer Neg Hx    Stomach cancer Neg Hx     SOCIAL HISTORY: Social History   Socioeconomic History   Marital status: Single    Spouse name: Not on file   Number of children: 2   Years of education: Not on file   Highest education level: Some college, no degree  Occupational History   Occupation: Warehouse Manager  Tobacco Use   Smoking status: Former    Current packs/day: 0.00    Types: Cigarettes    Quit date: 01/28/2007    Years since quitting: 17.2    Passive exposure: Never   Smokeless tobacco: Never  Vaping Use   Vaping status: Never Used  Substance and Sexual Activity   Alcohol use: Not Currently   Drug use: No   Sexual activity: Yes  Other Topics Concern   Not on file  Social History Narrative   The patient is divorced he has 1 son and 1 daughter 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   Considering retirement at age 28 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)   No alcohol tobacco or drugs former smoker   Social Drivers of Corporate Investment Banker Strain: Low Risk  (02/08/2024)   Overall Financial Resource Strain (CARDIA)    Difficulty of Paying Living Expenses: Not hard at all  Food Insecurity: No Food Insecurity (02/08/2024)   Hunger Vital Sign    Worried About Running Out of Food in the Last Year: Never true    Ran Out of Food in the Last Year: Never true  Transportation Needs: No Transportation Needs (02/08/2024)   PRAPARE -  Administrator, Civil Service (Medical): No    Lack of Transportation (Non-Medical): No  Physical Activity: Insufficiently Active (02/08/2024)   Exercise Vital Sign    Days of Exercise per Week: 2 days    Minutes of Exercise per Session: 30  min  Stress: No Stress Concern Present (02/08/2024)   [employer] of Occupational Health - Occupational Stress Questionnaire    Feeling of Stress: Only a little  Social Connections: Socially Integrated (02/08/2024)   Social Connection and Isolation Panel    Frequency of Communication with Friends and Family: Three times a week    Frequency of Social Gatherings with Friends and Family: Three times a week    Attends Religious Services: More than 4 times per year    Active Member of Clubs or Organizations: Yes    Attends Banker Meetings: More than 4 times per year    Marital Status: Living with partner  Intimate Partner Violence: Not on file       PHYSICAL EXAM  Vitals:   04/27/24 0854  BP: 124/82  Weight: 196 lb (88.9 kg)  Height: 5' 7 (1.702 m)    Body mass index is 30.7 kg/m.   General: The patient is well-developed and well-nourished and in no acute distress  HEENT:  Head is [place]/AT.  Sclera are anicteric.  Funduscopic exam shows normal optic discs and retinal vessels.  Neck: No carotid bruits are noted.  The neck is nontender.  Cardiovascular: The heart has a regular rate and rhythm with a normal S1 and S2. There were no murmurs, gallops or rubs.    Skin: Extremities are without rash or  edema.  Musculoskeletal:  Back is nontender  Neurologic Exam  Mental status: The patient is alert and oriented x 3 at the time of the examination. The patient has apparent normal recent and remote memory, with an apparently normal attention span and concentration ability.   Speech is normal.  Cranial nerves: Extraocular movements are full. Pupils are equal, round, and reactive to light and accomodation.  Color  vision was symmetric.  Facial symmetry is present. There is good facial sensation to soft touch bilaterally.Facial strength is normal.  Trapezius and sternocleidomastoid strength is normal. No dysarthria is noted.  The tongue is midline, and the patient has symmetric elevation of the soft palate. No obvious hearing deficits are noted.  Motor:  Muscle bulk is normal.   Tone is normal. Strength is  5 / 5 in all 4 extremities.   Sensory: Sensory testing is intact to pinprick, soft touch and vibration sensation in all 4 extremities.  Coordination: Cerebellar testing reveals good finger-nose-finger and heel-to-shin bilaterally.  Gait and station: Station is normal.   Gait is normal. Tandem gait is mildly wide . Romberg is negative.   Reflexes: Deep tendon reflexes are symmetric and normal bilaterally.      DIAGNOSTIC DATA (LABS, IMAGING, TESTING) - I reviewed patient records, labs, notes, testing and imaging myself where available.  Lab Results  Component Value Date   WBC 4.7 09/25/2023   HGB 16.3 12/28/2023   HCT 48.0 12/28/2023   MCV 84.1 09/25/2023   PLT 213 09/25/2023      Component Value Date/Time   NA 143 12/28/2023 0855   K 3.9 12/28/2023 0855   CL 101 12/28/2023 0855   CO2 25 09/25/2023 0913   GLUCOSE 94 12/28/2023 0855   BUN 8 12/28/2023 0855   CREATININE 1.10 12/28/2023 0855   CREATININE 1.03 03/29/2020 1051   CALCIUM 9.2 09/25/2023 0913   PROT 7.2 09/25/2023 0913   ALBUMIN  4.0 09/25/2023 0913   AST 28 09/25/2023 0913   ALT 27 09/25/2023 0913   ALKPHOS 54 09/25/2023 0913   BILITOT 0.5 09/25/2023 0913   GFRNONAA >60 09/25/2023  9086   GFRAA >60 02/01/2019 1137   Lab Results  Component Value Date   CHOL 146 03/29/2020   HDL 44 03/29/2020   LDLCALC 68 03/29/2020   LDLDIRECT 118.0 03/11/2015   TRIG 262 (H) 03/29/2020   CHOLHDL 3.3 03/29/2020   Lab Results  Component Value Date   HGBA1C 5.0 03/12/2022   No results found for: VITAMINB12 Lab Results   Component Value Date   TSH 1.26 03/29/2020       ASSESSMENT AND PLAN  Chronic daily headache  Chronic migraine w/o aura, not intractable, w/o stat migr  Primary hypertension  Insomnia, unspecified type   In summary, Marcus Pruitt is a 52 year old man with chronic daily headache.  The headaches have mixed tension type and migrainous characteristics.  He had a CT scan that was read as normal (I do not have the actual images to review) and his neurologic examination was normal except for mild wide tandem gait.  To help with the headache frequency and intensity I will have him take a steroid pack and start imipramine.  Hopefully the imipramine will help his insomnia as well.SABRA  He is advised to call us  back in 6 weeks if there is no improvement or sooner if symptoms worsen.  We will consider MRI imaging if no better  I did not schedule follow-up but he is advised to call for new or worsening symptoms or if there is no benefit.  If there is no benefit from imipramine, consider one of the anti-CGRP injections or levetiracetam.  Due to his history of gout, would not recommend topiramate  or zonisamide.  If symptoms become much worse also consider splenius capitis trigger point injections.  Thank you for asking me to see this patient.  Please let me know if I can be of further assistance with him or other patients in the future.   Twania Bujak A. Vear, MD, [facility] 04/27/2024, 12:44 PM Certified in Neurology, Clinical Neurophysiology, Sleep Medicine and Neuroimaging  [facility] [address], [address] [place], [place] [address] [phone]

## 2024-04-27 ENCOUNTER — Ambulatory Visit (INDEPENDENT_AMBULATORY_CARE_PROVIDER_SITE_OTHER): Admitting: Neurology

## 2024-04-27 ENCOUNTER — Encounter: Payer: Self-pay | Admitting: Neurology

## 2024-04-27 VITALS — BP 124/82 | Ht 67.0 in | Wt 196.0 lb

## 2024-04-27 DIAGNOSIS — I10 Essential (primary) hypertension: Secondary | ICD-10-CM

## 2024-04-27 DIAGNOSIS — G43709 Chronic migraine without aura, not intractable, without status migrainosus: Secondary | ICD-10-CM | POA: Diagnosis not present

## 2024-04-27 DIAGNOSIS — R519 Headache, unspecified: Secondary | ICD-10-CM | POA: Insufficient documentation

## 2024-04-27 DIAGNOSIS — G4700 Insomnia, unspecified: Secondary | ICD-10-CM | POA: Diagnosis not present

## 2024-04-27 MED ORDER — IMIPRAMINE HCL 25 MG PO TABS: 25.0000 mg | ORAL_TABLET | Freq: Every day | ORAL | 11 refills | Status: DC

## 2024-04-27 MED ORDER — METHYLPREDNISOLONE 4 MG PO TABS: ORAL_TABLET | ORAL | 0 refills | Status: AC

## 2024-05-01 ENCOUNTER — Encounter: Payer: Self-pay | Admitting: Radiology

## 2024-05-21 ENCOUNTER — Other Ambulatory Visit: Payer: Self-pay | Admitting: Neurology

## 2024-05-29 ENCOUNTER — Ambulatory Visit: Admitting: Internal Medicine

## 2024-07-27 ENCOUNTER — Telehealth

## 2024-07-27 DIAGNOSIS — Z20822 Contact with and (suspected) exposure to covid-19: Secondary | ICD-10-CM | POA: Diagnosis not present

## 2024-07-27 MED ORDER — IPRATROPIUM BROMIDE 0.03 % NA SOLN: 2.0000 | Freq: Two times a day (BID) | NASAL | 0 refills | Status: AC

## 2024-07-27 MED ORDER — BENZONATATE 100 MG PO CAPS: 100.0000 mg | ORAL_CAPSULE | Freq: Three times a day (TID) | ORAL | 0 refills | Status: AC | PRN

## 2024-07-27 NOTE — Progress Notes (Signed)
 " Virtual Visit Consent   Marcus Pruitt, you are scheduled for a virtual visit with a [facility] provider today. Just as with appointments in the office, your consent must be obtained to participate. Your consent will be active for this visit and any virtual visit you may have with one of our providers in the next 365 days. If you have a MyChart account, a copy of this consent can be sent to you electronically.  As this is a virtual visit, video technology does not allow for your provider to perform a traditional examination. This may limit your provider's ability to fully assess your condition. If your provider identifies any concerns that need to be evaluated in person or the need to arrange testing (such as labs, EKG, etc.), we will make arrangements to do so. Although advances in technology are sophisticated, we cannot ensure that it will always work on either your end or our end. If the connection with a video visit is poor, the visit may have to be switched to a telephone visit. With either a video or telephone visit, we are not always able to ensure that we have a secure connection.  By engaging in this virtual visit, you consent to the provision of healthcare and authorize for your insurance to be billed (if applicable) for the services provided during this visit. Depending on your insurance coverage, you may receive a charge related to this service.  I need to obtain your verbal consent now. Are you willing to proceed with your visit today? Marcus Pruitt has provided verbal consent on 07/27/2024 for a virtual visit (video or telephone). Marcus Pruitt, [place]  Date: 07/27/2024 8:11 AM   Virtual Visit via Video Note   I, Marcus Pruitt, connected with  Marcus Pruitt  ([id number], February 19, 1971) on 07/27/24 at  8:00 AM EST by a video-enabled telemedicine application and verified that I am speaking with the correct person using two identifiers.  Location: Patient: Virtual Visit  Location Patient: Home Provider: Virtual Visit Location Provider: Home Office   I discussed the limitations of evaluation and management by telemedicine and the availability of in person appointments. The patient expressed understanding and agreed to proceed.    History of Present Illness: Marcus Pruitt is a 54 y.o. who identifies as a male who was assigned male at birth, and is being seen today for < 48 hours of URI symptoms starting with sore throat, shortly followed by abrupt onset of fatigue, aches, loss of taste/smell, nasal congestion, headache. Headache worsening of last night -- helped with Advil. And body aches. Sense of smell has returned. Still diminished sense taste. Took home COVID/Flu/RSV test that were negative.   OTC -- Zicam, Zyrtec, Ibuprofen.  HPI: HPI  Problems:  Patient Active Problem List   Diagnosis Date Noted   Chronic daily headache 04/27/2024   Obesity (BMI 30-39.9) 03/12/2022   History of penile implant 11/20/2021   Chronic diarrhea after superior mesenteric artery dissection repair 07/22/2021   Hx of adenomatous polyp of colon 07/22/2021   Acquired cyst of kidney 03/31/2018   Class 1 obesity due to excess calories without serious comorbidity with body mass index (BMI) of 30.0 to 30.9 in adult 03/31/2018   Male urinary stress incontinence 03/31/2018   Personal history of prostate cancer 03/31/2018   Peyronie's disease 03/31/2018   Urinary tract stones 03/31/2018   Low volume of ejaculated semen 03/06/2018   Small intestinal bacterial overgrowth 09/22/2017   Diarrhea  Superior mesenteric artery thrombosis 08/25/2017   Superior mesenteric vein thrombosis 07/16/2017   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/2017   Vascular disorder of small intestine 05/15/2017   Dissection of unspecified artery 05/15/2017   Blepharitis of eyelid of right eye 05/08/2014   HTN (hypertension) 03/30/2014   OBSTRUCTIVE SLEEP APNEA 08/04/2010   SNORING 07/21/2010    ERECTILE DYSFUNCTION, ORGANIC 06/13/2010   STRESS REACTION, ACUTE, WITH EMOTIONAL DISTURBANCE 04/18/2009   INSOMNIA 04/18/2009   Hearing loss 03/06/2009   Allergic rhinitis 03/06/2009   KNEE PAIN, BILATERAL 03/06/2009   LOW BACK PAIN 03/06/2009   Pes planus 03/06/2009    Allergies: Allergies[1] Medications: Current Medications[2]  Observations/Objective: Patient is well-developed, well-nourished in no acute distress.  Resting comfortably  at home.  Head is normocephalic, atraumatic.  No labored breathing.  Speech is clear and coherent with logical content.  Patient is alert and oriented at baseline.   Assessment and Plan: 1. Suspected COVID-19 virus infection (Primary) - ipratropium (ATROVENT ) 0.03 % nasal spray; Place 2 sprays into both nostrils every 12 (twelve) hours.  Dispense: 30 mL; Refill: 0 - benzonatate  (TESSALON ) 100 MG capsule; Take 1 capsule (100 mg total) by mouth 3 (three) times daily as needed for cough.  Dispense: 30 capsule; Refill: 0  Concern for false negative COVID test but giving improved symptoms today. No fever. Smell returned, will not change treatment course as antivirals unlikely to be of great benefit. Supportive measures and OTC medications reviewed. Atrovent  spray and Tessalon  per orders. Work note provided.  Follow Up Instructions: I discussed the assessment and treatment plan with the patient. The patient was provided an opportunity to ask questions and all were answered. The patient agreed with the plan and demonstrated an understanding of the instructions.  A copy of instructions were sent to the patient via MyChart unless otherwise noted below.   The patient was advised to call back or seek an in-person evaluation if the symptoms worsen or if the condition fails to improve as anticipated.    Marcus Velma Lunger, PA-C    [1]  Allergies Allergen Reactions   Bee Venom Anaphylaxis and Shortness Of Breath    SOB, , hand swelled up after getting  stung on her hand  [2]  Current Outpatient Medications:    benzonatate  (TESSALON ) 100 MG capsule, Take 1 capsule (100 mg total) by mouth 3 (three) times daily as needed for cough., Disp: 30 capsule, Rfl: 0   ipratropium (ATROVENT ) 0.03 % nasal spray, Place 2 sprays into both nostrils every 12 (twelve) hours., Disp: 30 mL, Rfl: 0   acetaminophen  (TYLENOL ) 500 MG tablet, Take 2 tablets (1,000 mg total) by mouth every 8 (eight) hours., Disp: 50 tablet, Rfl: 0   allopurinol  (ZYLOPRIM ) 100 MG tablet, TAKE 1 TABLET BY MOUTH TWICE A DAY, Disp: 180 tablet, Rfl: 1   amLODipine  (NORVASC ) 5 MG tablet, TAKE 1 TABLET BY MOUTH EVERY DAY, Disp: 90 tablet, Rfl: 1   cholestyramine  (QUESTRAN ) 4 g packet, Take 1 packet by mouth daily., Disp: , Rfl:    colchicine  0.6 MG tablet, TAKE 1 TABLET BY MOUTH EVERY DAY (Patient taking differently: Take 0.6 mg by mouth daily as needed (GOUT FLARE).), Disp: 90 tablet, Rfl: 1   EPINEPHrine  (EPIPEN  2-PAK) 0.3 mg/0.3 mL IJ SOAJ injection, Inject 0.3 mLs (0.3 mg total) into the muscle as needed for anaphylaxis., Disp: 1 each, Rfl: 2   imipramine  (TOFRANIL ) 25 MG tablet, TAKE 1 TABLET BY MOUTH EVERYDAY AT BEDTIME, Disp: 90 tablet, Rfl:  4   methylPREDNISolone  (MEDROL ) 4 MG tablet, Taper from 6 pills po for one day to 1 pill po the last day over 6 days, Disp: 21 tablet, Rfl: 0  "

## 2024-07-27 NOTE — Patient Instructions (Signed)
 " Marcus Pruitt, thank you for joining [name], PA-C for today's virtual visit.  While this provider is not your primary care provider (PCP), if your PCP is located in our provider database this encounter information will be shared with them immediately following your visit.   A [facility] MyChart account gives you access to today's visit and all your visits, tests, and labs performed at [facility]  click here if you don't have a [facility] MyChart account or go to [email or web address]  Consent: (Patient) Marcus Pruitt provided verbal consent for this virtual visit at the beginning of the encounter.  Current Medications:  Current Outpatient Medications:    benzonatate  (TESSALON ) 100 MG capsule, Take 1 capsule (100 mg total) by mouth 3 (three) times daily as needed for cough., Disp: 30 capsule, Rfl: 0   ipratropium (ATROVENT ) 0.03 % nasal spray, Place 2 sprays into both nostrils every 12 (twelve) hours., Disp: 30 mL, Rfl: 0   acetaminophen  (TYLENOL ) 500 MG tablet, Take 2 tablets (1,000 mg total) by mouth every 8 (eight) hours., Disp: 50 tablet, Rfl: 0   allopurinol  (ZYLOPRIM ) 100 MG tablet, TAKE 1 TABLET BY MOUTH TWICE A DAY, Disp: 180 tablet, Rfl: 1   amLODipine  (NORVASC ) 5 MG tablet, TAKE 1 TABLET BY MOUTH EVERY DAY, Disp: 90 tablet, Rfl: 1   cholestyramine  (QUESTRAN ) 4 g packet, Take 1 packet by mouth daily., Disp: , Rfl:    colchicine  0.6 MG tablet, TAKE 1 TABLET BY MOUTH EVERY DAY (Patient taking differently: Take 0.6 mg by mouth daily as needed (GOUT FLARE).), Disp: 90 tablet, Rfl: 1   EPINEPHrine  (EPIPEN  2-PAK) 0.3 mg/0.3 mL IJ SOAJ injection, Inject 0.3 mLs (0.3 mg total) into the muscle as needed for anaphylaxis., Disp: 1 each, Rfl: 2   imipramine  (TOFRANIL ) 25 MG tablet, TAKE 1 TABLET BY MOUTH EVERYDAY AT BEDTIME, Disp: 90 tablet, Rfl: 4   methylPREDNISolone  (MEDROL ) 4 MG tablet, Taper from 6 pills po for one day to 1 pill po the last day over 6  days, Disp: 21 tablet, Rfl: 0   Medications ordered in this encounter:  Meds ordered this encounter  Medications   ipratropium (ATROVENT ) 0.03 % nasal spray    Sig: Place 2 sprays into both nostrils every 12 (twelve) hours.    Dispense:  30 mL    Refill:  0    Supervising Provider:   LAMPTEY, PHILIP O [[id number]]   benzonatate  (TESSALON ) 100 MG capsule    Sig: Take 1 capsule (100 mg total) by mouth 3 (three) times daily as needed for cough.    Dispense:  30 capsule    Refill:  0    Supervising Provider:   BLAISE ALEENE KIDD [[id number]]     *If you need refills on other medications prior to your next appointment, please contact your pharmacy*  Follow-Up: Call back or seek an in-person evaluation if the symptoms worsen or if the condition fails to improve as anticipated.   Virtual Care [phone]  Care Instructions: Please keep well-hydrated and get plenty of rest. Start a saline nasal rinse to flush out your nasal passages. You can use plain Mucinex  to help thin congestion. If you have a humidifier, running in the bedroom at night. I want you to start OTC vitamin D3 1000 units daily, vitamin C 1000 mg daily, and a zinc supplement. Please take prescribed medications as directed.    Isolation Instructions: You are to isolate at home until you have been  fever free for at least 24 hours without a fever-reducing medication, and symptoms have been steadily improving for 24 hours. At that time,  you can end isolation but need to mask for an additional 5 days.   If you must be around other household members who do not have symptoms, you need to make sure that both you and the family members are masking consistently with a high-quality mask.  If you note any worsening of symptoms despite treatment, please seek an in-person evaluation ASAP. If you note any significant shortness of breath or any chest pain, please seek ER evaluation. Please do not delay care!   COVID-19: What  to Do if You Are Sick If you test positive and are an older adult or someone who is at high risk of getting very sick from COVID-19, treatment may be available. Contact a healthcare provider right away after a positive test to determine if you are eligible, even if your symptoms are mild right now. You can also visit a Test to Treat location and, if eligible, receive a prescription from a provider. Don't delay: Treatment must be started within the first few days to be effective. If you have a fever, cough, or other symptoms, you might have COVID-19. Most people have mild illness and are able to recover at home. If you are sick: Keep track of your symptoms. If you have an emergency warning sign (including trouble breathing), call 911. Steps to help prevent the spread of COVID-19 if you are sick If you are sick with COVID-19 or think you might have COVID-19, follow the steps below to care for yourself and to help protect other people in your home and community. Stay home except to get medical care Stay home. Most people with COVID-19 have mild illness and can recover at home without medical care. Do not leave your home, except to get medical care. Do not visit public areas and do not go to places where you are unable to wear a mask. Take care of yourself. Get rest and stay hydrated. Take over-the-counter medicines, such as acetaminophen , to help you feel better. Stay in touch with your doctor. Call before you get medical care. Be sure to get care if you have trouble breathing, or have any other emergency warning signs, or if you think it is an emergency. Avoid public transportation, ride-sharing, or taxis if possible. Get tested If you have symptoms of COVID-19, get tested. While waiting for test results, stay away from others, including staying apart from those living in your household. Get tested as soon as possible after your symptoms start. Treatments may be available for people with COVID-19 who are  at risk for becoming very sick. Don't delay: Treatment must be started early to be effective--some treatments must begin within 5 days of your first symptoms. Contact your healthcare provider right away if your test result is positive to determine if you are eligible. Self-tests are one of several options for testing for the virus that causes COVID-19 and may be more convenient than laboratory-based tests and point-of-care tests. Ask your healthcare provider or your local health department if you need help interpreting your test results. You can visit your state, tribal, local, and territorial health department's website to look for the latest local information on testing sites. Separate yourself from other people As much as possible, stay in a specific room and away from other people and pets in your home. If possible, you should use a separate bathroom. If you need  to be around other people or animals in or outside of the home, wear a well-fitting mask. Tell your close contacts that they may have been exposed to COVID-19. An infected person can spread COVID-19 starting 48 hours (or 2 days) before the person has any symptoms or tests positive. By letting your close contacts know they may have been exposed to COVID-19, you are helping to protect everyone. See COVID-19 and Animals if you have questions about pets. If you are diagnosed with COVID-19, someone from the health department may call you. Answer the call to slow the spread. Monitor your symptoms Symptoms of COVID-19 include fever, cough, or other symptoms. Follow care instructions from your healthcare provider and local health department. Your local health authorities may give instructions on checking your symptoms and reporting information. When to seek emergency medical attention Look for emergency warning signs* for COVID-19. If someone is showing any of these signs, seek emergency medical care immediately: Trouble breathing Persistent pain  or pressure in the chest New confusion Inability to wake or stay awake Pale, gray, or blue-colored skin, lips, or nail beds, depending on skin tone *This list is not all possible symptoms. Please call your medical provider for any other symptoms that are severe or concerning to you. Call 911 or call ahead to your local emergency facility: Notify the operator that you are seeking care for someone who has or may have COVID-19. Call ahead before visiting your doctor Call ahead. Many medical visits for routine care are being postponed or done by phone or telemedicine. If you have a medical appointment that cannot be postponed, call your doctor's office, and tell them you have or may have COVID-19. This will help the office protect themselves and other patients. If you are sick, wear a well-fitting mask You should wear a mask if you must be around other people or animals, including pets (even at home). Wear a mask with the best fit, protection, and comfort for you. You don't need to wear the mask if you are alone. If you can't put on a mask (because of trouble breathing, for example), cover your coughs and sneezes in some other way. Try to stay at least 6 feet away from other people. This will help protect the people around you. Masks should not be placed on young children under age 18 years, anyone who has trouble breathing, or anyone who is not able to remove the mask without help. Cover your coughs and sneezes Cover your mouth and nose with a tissue when you cough or sneeze. Throw away used tissues in a lined trash can. Immediately wash your hands with soap and water  for at least 20 seconds. If soap and water  are not available, clean your hands with an alcohol-based hand sanitizer that contains at least 60% alcohol. Clean your hands often Wash your hands often with soap and water  for at least 20 seconds. This is especially important after blowing your nose, coughing, or sneezing; going to the  bathroom; and before eating or preparing food. Use hand sanitizer if soap and water  are not available. Use an alcohol-based hand sanitizer with at least 60% alcohol, covering all surfaces of your hands and rubbing them together until they feel dry. Soap and water  are the best option, especially if hands are visibly dirty. Avoid touching your eyes, nose, and mouth with unwashed hands. Handwashing Tips Avoid sharing personal household items Do not share dishes, drinking glasses, cups, eating utensils, towels, or bedding with other people in your  home. Wash these items thoroughly after using them with soap and water  or put in the dishwasher. Clean surfaces in your home regularly Clean and disinfect high-touch surfaces (for example, doorknobs, tables, handles, light switches, and countertops) in your sick room and bathroom. In shared spaces, you should clean and disinfect surfaces and items after each use by the person who is ill. If you are sick and cannot clean, a caregiver or other person should only clean and disinfect the area around you (such as your bedroom and bathroom) on an as needed basis. Your caregiver/other person should wait as long as possible (at least several hours) and wear a mask before entering, cleaning, and disinfecting shared spaces that you use. Clean and disinfect areas that may have blood, stool, or body fluids on them. Use household cleaners and disinfectants. Clean visible dirty surfaces with household cleaners containing soap or detergent. Then, use a household disinfectant. Use a product from [employer] List N: Disinfectants for Coronavirus (COVID-19). Be sure to follow the instructions on the label to ensure safe and effective use of the product. Many products recommend keeping the surface wet with a disinfectant for a certain period of time (look at contact time on the product label). You may also need to wear personal protective equipment, such as gloves, depending on the  directions on the product label. Immediately after disinfecting, wash your hands with soap and water  for 20 seconds. For completed guidance on cleaning and disinfecting your home, visit Complete Disinfection Guidance. Take steps to improve ventilation at home Improve ventilation (air flow) at home to help prevent from spreading COVID-19 to other people in your household. Clear out COVID-19 virus particles in the air by opening windows, using air filters, and turning on fans in your home. Use this interactive tool to learn how to improve air flow in your home. When you can be around others after being sick with COVID-19 Deciding when you can be around others is different for different situations. Find out when you can safely end home isolation. For any additional questions about your care, contact your healthcare provider or state or local health department. 09/17/2020 Content source: [facility] ([facility]), Division of Viral Diseases This information is not intended to replace advice given to you by your health care provider. Make sure you discuss any questions you have with your health care provider. Document Revised: 10/31/2020 Document Reviewed: 10/31/2020 Elsevier Patient Education  2022 [employer].     If you have been instructed to have an in-person evaluation today at a local Urgent Care facility, please use the link below. It will take you to a list of all of our available [facility], including address, phone number and hours of operation. Please do not delay care.  [facility]  If you or a family member do not have a primary care provider, use the link below to schedule a visit and establish care. When you choose a [facility] primary care physician or advanced practice provider, you gain a long-term partner in health. Find a Primary Care Provider  Learn more about [facility]'s in-office and virtual care  options: [facility] - Get Care Now  "

## 2024-08-03 ENCOUNTER — Other Ambulatory Visit: Payer: Self-pay | Admitting: Family Medicine
